# Patient Record
Sex: Female | Born: 1935
Health system: Southern US, Community
[De-identification: ages and names within clinical notes are randomized; demographics above are authoritative.]

## PROBLEM LIST (undated history)

## (undated) DIAGNOSIS — L84 Corns and callosities: Secondary | ICD-10-CM

## (undated) DIAGNOSIS — M1990 Unspecified osteoarthritis, unspecified site: Secondary | ICD-10-CM

## (undated) DIAGNOSIS — I10 Essential (primary) hypertension: Secondary | ICD-10-CM

## (undated) DIAGNOSIS — Z90710 Acquired absence of both cervix and uterus: Secondary | ICD-10-CM

## (undated) DIAGNOSIS — H59023 Cataract (lens) fragments in eye following cataract surgery, bilateral: Secondary | ICD-10-CM

## (undated) HISTORY — DX: Corns and callosities: L84

## (undated) HISTORY — PX: TOTAL SHOULDER ARTHROPLASTY: SHX126

## (undated) HISTORY — PX: ROTATOR CUFF REPAIR: SHX139

## (undated) HISTORY — PX: ABDOMINAL HYSTERECTOMY: SHX81

## (undated) HISTORY — DX: Essential (primary) hypertension: I10

## (undated) HISTORY — DX: Cataract (lens) fragments in eye following cataract surgery, bilateral: H59.023

## (undated) HISTORY — PX: NASAL SEPTUM SURGERY: SHX37

## (undated) HISTORY — DX: Acquired absence of both cervix and uterus: Z90.710

---

## 2003-10-31 ENCOUNTER — Encounter: Admission: RE | Admit: 2003-10-31 | Discharge: 2003-10-31 | Payer: Self-pay | Admitting: Orthopedic Surgery

## 2003-11-01 ENCOUNTER — Ambulatory Visit (HOSPITAL_BASED_OUTPATIENT_CLINIC_OR_DEPARTMENT_OTHER): Admission: RE | Admit: 2003-11-01 | Discharge: 2003-11-01 | Payer: Self-pay | Admitting: Orthopedic Surgery

## 2003-11-01 ENCOUNTER — Ambulatory Visit (HOSPITAL_COMMUNITY): Admission: RE | Admit: 2003-11-01 | Discharge: 2003-11-01 | Payer: Self-pay | Admitting: Orthopedic Surgery

## 2005-05-21 ENCOUNTER — Encounter: Admission: RE | Admit: 2005-05-21 | Discharge: 2005-05-21 | Payer: Self-pay | Admitting: Internal Medicine

## 2006-04-02 ENCOUNTER — Encounter: Admission: RE | Admit: 2006-04-02 | Discharge: 2006-04-02 | Payer: Self-pay | Admitting: Internal Medicine

## 2006-04-06 ENCOUNTER — Encounter: Admission: RE | Admit: 2006-04-06 | Discharge: 2006-04-06 | Payer: Self-pay | Admitting: Internal Medicine

## 2006-05-05 ENCOUNTER — Encounter: Admission: RE | Admit: 2006-05-05 | Discharge: 2006-05-05 | Payer: Self-pay | Admitting: Gastroenterology

## 2011-08-13 ENCOUNTER — Ambulatory Visit: Payer: Medicare Other | Attending: Rheumatology | Admitting: Physical Therapy

## 2011-08-13 DIAGNOSIS — IMO0001 Reserved for inherently not codable concepts without codable children: Secondary | ICD-10-CM | POA: Insufficient documentation

## 2011-08-13 DIAGNOSIS — M25519 Pain in unspecified shoulder: Secondary | ICD-10-CM | POA: Insufficient documentation

## 2011-08-13 DIAGNOSIS — M25619 Stiffness of unspecified shoulder, not elsewhere classified: Secondary | ICD-10-CM | POA: Insufficient documentation

## 2011-08-15 ENCOUNTER — Ambulatory Visit: Payer: Medicare Other | Attending: Rheumatology | Admitting: Physical Therapy

## 2011-08-15 DIAGNOSIS — IMO0001 Reserved for inherently not codable concepts without codable children: Secondary | ICD-10-CM | POA: Insufficient documentation

## 2011-08-15 DIAGNOSIS — M25519 Pain in unspecified shoulder: Secondary | ICD-10-CM | POA: Insufficient documentation

## 2011-08-15 DIAGNOSIS — M25619 Stiffness of unspecified shoulder, not elsewhere classified: Secondary | ICD-10-CM | POA: Insufficient documentation

## 2011-08-18 ENCOUNTER — Ambulatory Visit: Payer: Medicare Other | Admitting: Physical Therapy

## 2011-08-22 ENCOUNTER — Ambulatory Visit: Payer: Medicare Other | Admitting: Physical Therapy

## 2011-08-26 ENCOUNTER — Ambulatory Visit: Payer: Medicare Other | Admitting: Physical Therapy

## 2011-08-29 ENCOUNTER — Ambulatory Visit: Payer: Medicare Other | Admitting: Physical Therapy

## 2011-09-02 ENCOUNTER — Ambulatory Visit: Payer: Medicare Other | Admitting: Physical Therapy

## 2011-09-04 ENCOUNTER — Ambulatory Visit: Payer: Medicare Other | Admitting: Physical Therapy

## 2011-09-09 ENCOUNTER — Ambulatory Visit: Payer: Medicare Other | Admitting: Physical Therapy

## 2011-09-11 ENCOUNTER — Ambulatory Visit: Payer: Medicare Other | Admitting: Physical Therapy

## 2011-09-12 ENCOUNTER — Ambulatory Visit: Payer: Medicare Other | Admitting: Physical Therapy

## 2011-09-15 ENCOUNTER — Ambulatory Visit: Payer: Medicare Other | Attending: Rheumatology | Admitting: Physical Therapy

## 2011-09-15 DIAGNOSIS — M25519 Pain in unspecified shoulder: Secondary | ICD-10-CM | POA: Insufficient documentation

## 2011-09-15 DIAGNOSIS — IMO0001 Reserved for inherently not codable concepts without codable children: Secondary | ICD-10-CM | POA: Insufficient documentation

## 2011-09-15 DIAGNOSIS — M25619 Stiffness of unspecified shoulder, not elsewhere classified: Secondary | ICD-10-CM | POA: Insufficient documentation

## 2011-09-18 ENCOUNTER — Ambulatory Visit: Payer: Medicare Other | Admitting: Physical Therapy

## 2012-08-03 ENCOUNTER — Ambulatory Visit: Payer: Medicare Other | Attending: Orthopaedic Surgery | Admitting: Physical Therapy

## 2012-08-03 DIAGNOSIS — M25519 Pain in unspecified shoulder: Secondary | ICD-10-CM | POA: Insufficient documentation

## 2012-08-03 DIAGNOSIS — Z96619 Presence of unspecified artificial shoulder joint: Secondary | ICD-10-CM | POA: Insufficient documentation

## 2012-08-03 DIAGNOSIS — M25619 Stiffness of unspecified shoulder, not elsewhere classified: Secondary | ICD-10-CM | POA: Insufficient documentation

## 2012-08-03 DIAGNOSIS — IMO0001 Reserved for inherently not codable concepts without codable children: Secondary | ICD-10-CM | POA: Insufficient documentation

## 2012-08-06 ENCOUNTER — Ambulatory Visit: Payer: Medicare Other | Admitting: Physical Therapy

## 2012-08-09 ENCOUNTER — Ambulatory Visit: Payer: Medicare Other | Admitting: Physical Therapy

## 2012-08-12 ENCOUNTER — Ambulatory Visit: Payer: Medicare Other | Admitting: Physical Therapy

## 2012-08-16 ENCOUNTER — Ambulatory Visit: Payer: Medicare Other | Attending: Orthopaedic Surgery | Admitting: Physical Therapy

## 2012-08-16 DIAGNOSIS — Z96619 Presence of unspecified artificial shoulder joint: Secondary | ICD-10-CM | POA: Insufficient documentation

## 2012-08-16 DIAGNOSIS — M25519 Pain in unspecified shoulder: Secondary | ICD-10-CM | POA: Insufficient documentation

## 2012-08-16 DIAGNOSIS — IMO0001 Reserved for inherently not codable concepts without codable children: Secondary | ICD-10-CM | POA: Insufficient documentation

## 2012-08-16 DIAGNOSIS — M25619 Stiffness of unspecified shoulder, not elsewhere classified: Secondary | ICD-10-CM | POA: Insufficient documentation

## 2012-08-19 ENCOUNTER — Ambulatory Visit: Payer: Medicare Other | Admitting: Physical Therapy

## 2012-08-23 ENCOUNTER — Ambulatory Visit: Payer: Medicare Other | Admitting: Physical Therapy

## 2012-08-26 ENCOUNTER — Ambulatory Visit: Payer: Medicare Other | Admitting: Physical Therapy

## 2012-08-30 ENCOUNTER — Ambulatory Visit: Payer: Medicare Other | Admitting: Physical Therapy

## 2012-09-02 ENCOUNTER — Ambulatory Visit: Payer: Medicare Other | Admitting: Physical Therapy

## 2012-09-06 ENCOUNTER — Ambulatory Visit: Payer: Medicare Other | Admitting: Physical Therapy

## 2012-09-09 ENCOUNTER — Encounter: Payer: Self-pay | Admitting: Podiatry

## 2012-09-09 ENCOUNTER — Ambulatory Visit: Payer: Medicare Other | Admitting: Physical Therapy

## 2012-09-13 ENCOUNTER — Ambulatory Visit: Payer: Medicare Other | Admitting: Physical Therapy

## 2012-09-15 ENCOUNTER — Ambulatory Visit (INDEPENDENT_AMBULATORY_CARE_PROVIDER_SITE_OTHER): Payer: Medicare Other | Admitting: Podiatry

## 2012-09-15 ENCOUNTER — Encounter: Payer: Self-pay | Admitting: Podiatry

## 2012-09-15 VITALS — BP 151/94 | HR 68 | Ht 60.0 in | Wt 108.0 lb

## 2012-09-15 DIAGNOSIS — M25572 Pain in left ankle and joints of left foot: Secondary | ICD-10-CM

## 2012-09-15 DIAGNOSIS — M25579 Pain in unspecified ankle and joints of unspecified foot: Secondary | ICD-10-CM

## 2012-09-15 DIAGNOSIS — Q828 Other specified congenital malformations of skin: Secondary | ICD-10-CM

## 2012-09-15 NOTE — Patient Instructions (Addendum)
Today all corns, calluses and toe nails trimmed. Return as needed.

## 2012-09-15 NOTE — Progress Notes (Signed)
Subjective: 79 y.o. year old female patient presents complaining of painful callus under the ball of left foot. Patient requests toe nails, corns and calluses trimmed.   Review of Systems - General ROS: negative for - chills, fatigue, fever, hot flashes, malaise, night sweats, sleep disturbance, weight gain or weight loss.  Objective: Dermatologic: Mildly hypertrophic nails. Porokeratotic lesion under 5th MPJ left foot symptomatic.  Vascular: Pedal pulses are all palpable. Orthopedic: Contracted lesser digits  Neurologic: All epicritic and tactile sensations grossly intact.  Assessment: Painful porokeratosis sub 5 left.  Treatment: All mycotic nails, corns, calluses debrided.  Return in 3 months or as needed.

## 2012-09-16 ENCOUNTER — Ambulatory Visit: Payer: Medicare Other | Attending: Orthopaedic Surgery | Admitting: Physical Therapy

## 2012-09-16 DIAGNOSIS — M25619 Stiffness of unspecified shoulder, not elsewhere classified: Secondary | ICD-10-CM | POA: Insufficient documentation

## 2012-09-16 DIAGNOSIS — Z96619 Presence of unspecified artificial shoulder joint: Secondary | ICD-10-CM | POA: Insufficient documentation

## 2012-09-16 DIAGNOSIS — IMO0001 Reserved for inherently not codable concepts without codable children: Secondary | ICD-10-CM | POA: Insufficient documentation

## 2012-09-16 DIAGNOSIS — M25519 Pain in unspecified shoulder: Secondary | ICD-10-CM | POA: Insufficient documentation

## 2012-09-20 ENCOUNTER — Ambulatory Visit: Payer: Medicare Other | Admitting: Physical Therapy

## 2012-09-23 ENCOUNTER — Ambulatory Visit: Payer: Medicare Other | Admitting: Physical Therapy

## 2012-09-28 ENCOUNTER — Ambulatory Visit: Payer: Medicare Other | Admitting: Physical Therapy

## 2012-09-30 ENCOUNTER — Ambulatory Visit: Payer: Medicare Other | Admitting: Physical Therapy

## 2012-10-04 ENCOUNTER — Ambulatory Visit: Payer: Medicare Other | Admitting: Physical Therapy

## 2012-10-05 ENCOUNTER — Encounter: Payer: Medicare Other | Admitting: Physical Therapy

## 2012-10-05 ENCOUNTER — Ambulatory Visit: Payer: Medicare Other | Admitting: Physical Therapy

## 2012-10-07 ENCOUNTER — Ambulatory Visit: Payer: Medicare Other | Admitting: Physical Therapy

## 2012-10-08 ENCOUNTER — Ambulatory Visit: Payer: Self-pay | Admitting: Podiatry

## 2012-10-12 ENCOUNTER — Ambulatory Visit: Payer: Medicare Other | Admitting: Physical Therapy

## 2012-10-14 ENCOUNTER — Ambulatory Visit: Payer: Medicare Other | Attending: Orthopaedic Surgery | Admitting: Physical Therapy

## 2012-10-14 DIAGNOSIS — M25519 Pain in unspecified shoulder: Secondary | ICD-10-CM | POA: Insufficient documentation

## 2012-10-14 DIAGNOSIS — M25619 Stiffness of unspecified shoulder, not elsewhere classified: Secondary | ICD-10-CM | POA: Insufficient documentation

## 2012-10-14 DIAGNOSIS — Z96619 Presence of unspecified artificial shoulder joint: Secondary | ICD-10-CM | POA: Insufficient documentation

## 2012-10-14 DIAGNOSIS — IMO0001 Reserved for inherently not codable concepts without codable children: Secondary | ICD-10-CM | POA: Insufficient documentation

## 2012-10-19 ENCOUNTER — Ambulatory Visit: Payer: Medicare Other | Admitting: Physical Therapy

## 2012-10-21 ENCOUNTER — Ambulatory Visit: Payer: Medicare Other | Admitting: Physical Therapy

## 2012-10-26 ENCOUNTER — Ambulatory Visit: Payer: Medicare Other | Admitting: Physical Therapy

## 2012-10-28 ENCOUNTER — Ambulatory Visit: Payer: Medicare Other | Admitting: Physical Therapy

## 2012-11-01 ENCOUNTER — Ambulatory Visit: Payer: Medicare Other | Admitting: Physical Therapy

## 2012-11-02 ENCOUNTER — Ambulatory Visit: Payer: Medicare Other | Admitting: Physical Therapy

## 2012-11-04 ENCOUNTER — Ambulatory Visit: Payer: Medicare Other | Admitting: Physical Therapy

## 2012-11-09 ENCOUNTER — Ambulatory Visit: Payer: Medicare Other | Admitting: Physical Therapy

## 2012-11-11 ENCOUNTER — Ambulatory Visit: Payer: Medicare Other | Admitting: Physical Therapy

## 2012-11-16 ENCOUNTER — Ambulatory Visit: Payer: Medicare Other | Attending: Orthopaedic Surgery | Admitting: Physical Therapy

## 2012-11-16 DIAGNOSIS — M25519 Pain in unspecified shoulder: Secondary | ICD-10-CM | POA: Insufficient documentation

## 2012-11-16 DIAGNOSIS — Z96619 Presence of unspecified artificial shoulder joint: Secondary | ICD-10-CM | POA: Insufficient documentation

## 2012-11-16 DIAGNOSIS — IMO0001 Reserved for inherently not codable concepts without codable children: Secondary | ICD-10-CM | POA: Insufficient documentation

## 2012-11-16 DIAGNOSIS — M25619 Stiffness of unspecified shoulder, not elsewhere classified: Secondary | ICD-10-CM | POA: Insufficient documentation

## 2012-11-18 ENCOUNTER — Ambulatory Visit: Payer: Medicare Other | Admitting: Physical Therapy

## 2012-11-23 ENCOUNTER — Ambulatory Visit: Payer: Medicare Other | Admitting: Physical Therapy

## 2012-11-24 ENCOUNTER — Ambulatory Visit (INDEPENDENT_AMBULATORY_CARE_PROVIDER_SITE_OTHER): Payer: Medicare Other | Admitting: Podiatry

## 2012-11-24 VITALS — BP 142/88 | HR 68

## 2012-11-24 DIAGNOSIS — L84 Corns and callosities: Secondary | ICD-10-CM

## 2012-11-24 DIAGNOSIS — M25579 Pain in unspecified ankle and joints of unspecified foot: Secondary | ICD-10-CM

## 2012-11-24 NOTE — Progress Notes (Signed)
Subjective: 79 year old female patient presents with daughter who is visiting from [place], complaining of pain under the 5th MPJ corn and right 5th toe corn. Patient has corn pad under the 5th MPJ area left foot. Objective: Varus rotated 5th digit with enlarged distal phalangeal bone and callus build up. Plantar porokeratosis sub 5 left. Digital corn distal lateral 5th right. Pedal pulses palpable bilateral. No other problems at this time. Assessment: Porokeratosis painful right 5th digit and left under 5th MPJ. Contracted 5th digit bilateral. Plan: Debrided all painful corns and calluses.

## 2012-11-25 ENCOUNTER — Ambulatory Visit: Payer: Medicare Other | Admitting: Physical Therapy

## 2012-11-30 ENCOUNTER — Ambulatory Visit: Payer: Medicare Other | Admitting: Physical Therapy

## 2012-12-02 ENCOUNTER — Ambulatory Visit: Payer: Medicare Other | Admitting: Physical Therapy

## 2012-12-07 ENCOUNTER — Ambulatory Visit: Payer: Medicare Other | Admitting: Physical Therapy

## 2012-12-09 ENCOUNTER — Ambulatory Visit: Payer: Medicare Other | Admitting: Physical Therapy

## 2012-12-14 ENCOUNTER — Ambulatory Visit: Payer: Medicare Other | Attending: Orthopaedic Surgery | Admitting: Physical Therapy

## 2012-12-14 DIAGNOSIS — M25519 Pain in unspecified shoulder: Secondary | ICD-10-CM | POA: Insufficient documentation

## 2012-12-14 DIAGNOSIS — IMO0001 Reserved for inherently not codable concepts without codable children: Secondary | ICD-10-CM | POA: Insufficient documentation

## 2012-12-14 DIAGNOSIS — Z96619 Presence of unspecified artificial shoulder joint: Secondary | ICD-10-CM | POA: Insufficient documentation

## 2012-12-14 DIAGNOSIS — M25619 Stiffness of unspecified shoulder, not elsewhere classified: Secondary | ICD-10-CM | POA: Insufficient documentation

## 2012-12-16 ENCOUNTER — Ambulatory Visit: Payer: Medicare Other | Admitting: Physical Therapy

## 2012-12-21 ENCOUNTER — Ambulatory Visit: Payer: Medicare Other | Admitting: Physical Therapy

## 2012-12-23 ENCOUNTER — Ambulatory Visit: Payer: Medicare Other | Admitting: Physical Therapy

## 2012-12-28 ENCOUNTER — Ambulatory Visit: Payer: Medicare Other | Admitting: Physical Therapy

## 2012-12-30 ENCOUNTER — Ambulatory Visit: Payer: Medicare Other | Admitting: Rehabilitation

## 2012-12-30 ENCOUNTER — Ambulatory Visit: Payer: Medicare Other | Admitting: Physical Therapy

## 2013-01-04 ENCOUNTER — Ambulatory Visit: Payer: Medicare Other | Admitting: Physical Therapy

## 2013-01-06 ENCOUNTER — Ambulatory Visit: Payer: Medicare Other | Admitting: Physical Therapy

## 2013-01-11 ENCOUNTER — Ambulatory Visit: Payer: Medicare Other | Admitting: Physical Therapy

## 2013-01-13 ENCOUNTER — Ambulatory Visit: Payer: Medicare Other | Admitting: Physical Therapy

## 2013-01-18 ENCOUNTER — Ambulatory Visit: Payer: Medicare Other | Attending: Orthopaedic Surgery | Admitting: Physical Therapy

## 2013-01-18 DIAGNOSIS — M25519 Pain in unspecified shoulder: Secondary | ICD-10-CM | POA: Insufficient documentation

## 2013-01-18 DIAGNOSIS — Z96619 Presence of unspecified artificial shoulder joint: Secondary | ICD-10-CM | POA: Insufficient documentation

## 2013-01-18 DIAGNOSIS — M25619 Stiffness of unspecified shoulder, not elsewhere classified: Secondary | ICD-10-CM | POA: Insufficient documentation

## 2013-01-18 DIAGNOSIS — IMO0001 Reserved for inherently not codable concepts without codable children: Secondary | ICD-10-CM | POA: Insufficient documentation

## 2013-01-20 ENCOUNTER — Ambulatory Visit: Payer: Medicare Other | Admitting: Physical Therapy

## 2013-01-26 ENCOUNTER — Ambulatory Visit (INDEPENDENT_AMBULATORY_CARE_PROVIDER_SITE_OTHER): Payer: Medicare Other | Admitting: Podiatry

## 2013-01-26 DIAGNOSIS — M25579 Pain in unspecified ankle and joints of unspecified foot: Secondary | ICD-10-CM

## 2013-01-26 DIAGNOSIS — M25572 Pain in left ankle and joints of left foot: Secondary | ICD-10-CM

## 2013-01-26 DIAGNOSIS — L84 Corns and callosities: Secondary | ICD-10-CM

## 2013-01-26 NOTE — Progress Notes (Signed)
79 year old female presents with painful callus on left foot. Patient requests the area to be numbed before trimming.  Objective: Pedal pulses are all palpable. Multiple digital corns 5th distal lateral bilateral. Callus under 5th MPJ left. Porokeratotic lesion under lateral plantar proximal to the 5th MPJ left. Multiple digital contractures bilateral. All epicritic and tactile sensations grossly intact. Mycotic nails x 10.  Assessment: Painful porokeratosis sub 5 left and lateral surface left foot. Hypertrophic nails x 10.  Tx. 3ml of 1% Xylocaine with epinephrine injected proximal to plantar lateral lesion. All calluses, porokeratosis and nails debrided. This relieved pain.  Return as needed.

## 2013-01-27 ENCOUNTER — Ambulatory Visit: Payer: Medicare Other | Admitting: Physical Therapy

## 2013-02-01 ENCOUNTER — Ambulatory Visit: Payer: Medicare Other | Admitting: Physical Therapy

## 2013-02-03 ENCOUNTER — Ambulatory Visit: Payer: Medicare Other | Admitting: Physical Therapy

## 2013-02-08 ENCOUNTER — Ambulatory Visit: Payer: Medicare Other | Admitting: Physical Therapy

## 2013-02-10 ENCOUNTER — Ambulatory Visit: Payer: Medicare Other | Admitting: Physical Therapy

## 2013-02-15 ENCOUNTER — Encounter: Payer: Medicare Other | Admitting: Physical Therapy

## 2013-02-16 ENCOUNTER — Ambulatory Visit: Payer: Medicare Other | Admitting: Physical Therapy

## 2013-02-18 ENCOUNTER — Ambulatory Visit: Payer: Medicare Other | Admitting: Physical Therapy

## 2013-03-22 ENCOUNTER — Ambulatory Visit: Payer: Medicare Other | Attending: Orthopaedic Surgery | Admitting: Physical Therapy

## 2013-03-22 DIAGNOSIS — IMO0001 Reserved for inherently not codable concepts without codable children: Secondary | ICD-10-CM | POA: Insufficient documentation

## 2013-03-22 DIAGNOSIS — Z96619 Presence of unspecified artificial shoulder joint: Secondary | ICD-10-CM | POA: Insufficient documentation

## 2013-03-22 DIAGNOSIS — M25519 Pain in unspecified shoulder: Secondary | ICD-10-CM | POA: Insufficient documentation

## 2013-03-22 DIAGNOSIS — M25619 Stiffness of unspecified shoulder, not elsewhere classified: Secondary | ICD-10-CM | POA: Insufficient documentation

## 2013-06-15 ENCOUNTER — Ambulatory Visit (INDEPENDENT_AMBULATORY_CARE_PROVIDER_SITE_OTHER): Payer: Medicare Other | Admitting: Podiatry

## 2013-06-15 ENCOUNTER — Encounter: Payer: Self-pay | Admitting: Podiatry

## 2013-06-15 VITALS — BP 145/58 | HR 68 | Ht <= 58 in | Wt 111.0 lb

## 2013-06-15 DIAGNOSIS — L84 Corns and callosities: Secondary | ICD-10-CM

## 2013-06-15 DIAGNOSIS — M25579 Pain in unspecified ankle and joints of unspecified foot: Secondary | ICD-10-CM

## 2013-06-15 NOTE — Patient Instructions (Signed)
Seen for painful calluses. All calluses debrided. Return as needed.  

## 2013-06-15 NOTE — Progress Notes (Signed)
Subjective: 79 year old female presents with painful callus on left foot.   Objective: Pedal pulses are all palpable.  Multiple digital corns 5th distal lateral bilateral. Callus under 5th MPJ left. Porokeratotic lesion under lateral plantar proximal to the 5th MPJ left.  Multiple digital contractures bilateral.  All epicritic and tactile sensations grossly intact.  Mycotic nails x 10.   Assessment: Painful porokeratosis sub 5 left and lateral surface left foot.  Hypertrophic nails x 10.   Tx.  All calluses, porokeratosis and nails debrided. This relieved pain.  Return as needed.

## 2013-09-13 ENCOUNTER — Encounter: Payer: Self-pay | Admitting: Podiatry

## 2013-09-13 ENCOUNTER — Ambulatory Visit: Payer: Medicare Other | Admitting: Podiatry

## 2013-09-13 ENCOUNTER — Ambulatory Visit (INDEPENDENT_AMBULATORY_CARE_PROVIDER_SITE_OTHER): Payer: Self-pay | Admitting: Podiatry

## 2013-09-13 VITALS — BP 152/92 | HR 63 | Ht <= 58 in | Wt 110.0 lb

## 2013-09-13 DIAGNOSIS — L84 Corns and callosities: Secondary | ICD-10-CM

## 2013-09-13 DIAGNOSIS — M25579 Pain in unspecified ankle and joints of unspecified foot: Secondary | ICD-10-CM

## 2013-09-13 NOTE — Patient Instructions (Signed)
Seen for painful calluses on both feet. All nails, corns and calluses debrided. Return in 2 months or as needed.

## 2013-09-13 NOTE — Progress Notes (Signed)
Subjective:  80 year old female presents with painful callus on left foot.   Objective: Pedal pulses are all palpable.  Multiple digital corns 5th distal lateral bilateral. Callus under 5th MPJ left.  Painful porokeratotic lesion under lateral plantar proximal to the 5th MPJ left.  Multiple digital contractures bilateral.  All epicritic and tactile sensations grossly intact.  Hypertrophic nails x 10.   Assessment: Painful porokeratosis sub 5 left and lateral surface left foot.  Hypertrophic nails x 10.   Plan: All calluses, porokeratosis and nails debrided.  Return in 2 months or as needed.

## 2014-03-24 ENCOUNTER — Ambulatory Visit (INDEPENDENT_AMBULATORY_CARE_PROVIDER_SITE_OTHER): Payer: Medicare Other | Admitting: Podiatrist

## 2014-03-24 ENCOUNTER — Ambulatory Visit (INDEPENDENT_AMBULATORY_CARE_PROVIDER_SITE_OTHER): Payer: Medicare Other

## 2014-03-24 VITALS — BP 163/77 | HR 66 | Resp 16

## 2014-03-24 DIAGNOSIS — L84 Corns and callosities: Secondary | ICD-10-CM

## 2014-03-24 DIAGNOSIS — M216X9 Other acquired deformities of unspecified foot: Secondary | ICD-10-CM

## 2014-03-24 DIAGNOSIS — M79673 Pain in unspecified foot: Secondary | ICD-10-CM

## 2014-03-24 DIAGNOSIS — Q828 Other specified congenital malformations of skin: Secondary | ICD-10-CM

## 2014-03-24 NOTE — Patient Instructions (Signed)
Corns and Calluses Corns are small areas of thickened skin that usually occur on the top, sides, or tip of a toe. They contain a cone-shaped core with a point that can press on a nerve below. This causes pain. Calluses are areas of thickened skin that usually develop on hands, fingers, palms, soles of the feet, and heels. These are areas that experience frequent friction or pressure. CAUSES  Corns are usually the result of rubbing (friction) or pressure from shoes that are too tight or do not fit properly. Calluses are caused by repeated friction and pressure on the affected areas. SYMPTOMS  A hard growth on the skin.  Pain or tenderness under the skin.  Sometimes, redness and swelling.  Increased discomfort while wearing tight-fitting shoes. DIAGNOSIS  Your caregiver can usually tell what the problem is by doing a physical exam. TREATMENT  Removing the cause of the friction or pressure is usually the only treatment needed. However, sometimes medicines can be used to help soften the hardened, thickened areas. These medicines include salicylic acid plasters and 12% ammonium lactate lotion. These medicines should only be used under the direction of your caregiver. HOME CARE INSTRUCTIONS   Try to remove pressure from the affected area.  You may wear donut-shaped corn pads to protect your skin.  You may use a pumice stone or nonmetallic nail file to gently reduce the thickness of a corn.  Wear properly fitted footwear.  If you have calluses on the hands, wear gloves during activities that cause friction.  If you have diabetes, you should regularly examine your feet. Tell your caregiver if you notice any problems with your feet. SEEK IMMEDIATE MEDICAL CARE IF:   You have increased pain, swelling, redness, or warmth in the affected area.  Your corn or callus starts to drain fluid or bleeds.  You are not getting better, even with treatment. Document Released: 03/08/2004 Document  Revised: 08/25/2011 Document Reviewed: 01/28/2011 Exit[employer] Patient Information 2015 [employer]. This information is not intended to replace advice given to you by your health care provider. Make sure you discuss any questions you have with your health care provider.  

## 2014-03-24 NOTE — Progress Notes (Signed)
   Subjective:    Patient ID: Danielle Dean, female    DOB: Oct 31, 1928, 80 y.o.   MRN: [id number]  HPI Comments: "I have these hard places on my toes"  Patient c/o thick, callused areas 5th toes bilateral and sub 5th MPJ bilateral, left over right, for several years. She is having trouble wearing shoes. She has seen Dr. Caffie Pinto in the past and she has trimmed. She tried corn remover at home. No help.  Toe Pain       Review of Systems  HENT: Positive for sinus pressure and sneezing.   Respiratory: Positive for cough.   Musculoskeletal: Positive for arthralgias and gait problem.  All other systems reviewed and are negative.      Objective:   Physical Exam  Patient is awake, alert, and oriented x 3.  In no acute distress.  Vascular status is intact with palpable pedal pulses at 2/4 DP and PT bilateral and capillary refill time within normal limits. Neurological sensation is also intact bilaterally via Semmes Weinstein monofilament at 5/5 sites. Light touch, vibratory sensation, Achilles tendon reflex is intact. Dermatological exam reveals skin color, turger and texture as normal. No open lesions present.  Musculature intact with dorsiflexion, plantarflexion, inversion, eversion.  Thin skin of feet, hpks bilateral 5th met heads, hpk's bilateral 5th toes laterally, adducto rotation 5th toes, pain right first mpj also noted.      Assessment & Plan:  Multiple calluses, prominent metatarsal head, porokeratotic lesions  Plan:  debridment of lesions carried out today without complication.  Recommended accomidative type orthotics to help cushion and pad the thin skin of her feet.  She was scanned at today's visit.  Will see her back for dispensing of the devices.  If any problems or concerns arise she will call.

## 2014-04-11 ENCOUNTER — Telehealth: Payer: Self-pay | Admitting: *Deleted

## 2014-04-11 NOTE — Telephone Encounter (Signed)
She's a patient there.  Can someone give me a call.  I want to discuss her treatment.  I'm her daughter.  Thank you.

## 2014-04-12 NOTE — Telephone Encounter (Signed)
I attempted to return her call and was disconnected.

## 2014-04-18 ENCOUNTER — Telehealth: Payer: Self-pay | Admitting: *Deleted

## 2014-04-18 NOTE — Telephone Encounter (Signed)
I want to talk to Dr. Faylene MillionEgerton's nurse.  I returned her call.  "I was there in October and she trimmed my calluses.  The one on my little finger still bothers me.  I was calling to schedule an appointment.  My daughter informed me that she has already scheduled me an appointment for Wednesday of next week at 2 o'clock.  Thanks for calling me back."

## 2014-04-26 ENCOUNTER — Ambulatory Visit (INDEPENDENT_AMBULATORY_CARE_PROVIDER_SITE_OTHER): Payer: Medicare Other | Admitting: Podiatrist

## 2014-04-26 ENCOUNTER — Encounter: Payer: Self-pay | Admitting: Podiatrist

## 2014-04-26 VITALS — BP 145/72 | HR 67 | Resp 16

## 2014-04-26 DIAGNOSIS — M216X9 Other acquired deformities of unspecified foot: Secondary | ICD-10-CM

## 2014-04-26 DIAGNOSIS — L84 Corns and callosities: Secondary | ICD-10-CM

## 2014-04-26 DIAGNOSIS — Q828 Other specified congenital malformations of skin: Secondary | ICD-10-CM

## 2014-04-30 NOTE — Progress Notes (Signed)
   Subjective:    Patient ID: Danielle Dean, female    DOB: 11-11-1928, 80 y.o.   MRN: [id number]  HPI Comments: "I have these hard places on my toes"  Patient presents with her daughter c/o thick, callused areas 5th toes bilateral and sub 5th MPJ bilateral, left over right, for several years. Her other daughter is a rheumatologist and suggested injections as she attributed her foot pain to arthritis.  I recommended a soft orthotic at the last visit and she has declined.    Toe Pain     Objective:   Physical Exam  Patient is awake, alert, and oriented x 3.  In no acute distress.  Neurovascular status intact and unchanged.   Thin skin of feet, hpks bilateral 5th met heads, hpk's bilateral 5th toes laterally, adducto rotation 5th toes left greater than right, pain right first mpj also noted.      Assessment & Plan:  Multiple calluses, prominent metatarsal head, porokeratotic lesions, arthritis  Plan:  Injected the left fifth toe with dexamethasone and marcaine plain.  debridment of lesions also carried out today without complication. She will be seen back in the future prn.  Discussed orthotics again however she would like to try shoes first.

## 2014-05-25 ENCOUNTER — Encounter: Payer: Self-pay | Admitting: Podiatry

## 2014-05-25 ENCOUNTER — Ambulatory Visit (INDEPENDENT_AMBULATORY_CARE_PROVIDER_SITE_OTHER): Payer: Medicare Other | Admitting: Podiatry

## 2014-05-25 VITALS — BP 145/72 | HR 67 | Resp 16

## 2014-05-25 DIAGNOSIS — L84 Corns and callosities: Secondary | ICD-10-CM | POA: Diagnosis not present

## 2014-05-25 DIAGNOSIS — M2042 Other hammer toe(s) (acquired), left foot: Secondary | ICD-10-CM

## 2014-05-25 NOTE — Patient Instructions (Signed)

## 2014-05-25 NOTE — Progress Notes (Signed)
Subjective:     Patient ID: Danielle Dean, female   DOB: 10/28/1928, 80 y.o.   MRN: [id number]  HPI patient presents with daughter with a painful fifth toe left foot on the distal lateral side that's very tender when pressed and making it hard to walk comfortably she only was treated a few weeks ago and it has already reoccurred to be significant degree   Review of Systems     Objective:   Physical Exam Neurovascular status intact with significant distal rotation of the fifth toe left with obvious digital deformity and distal lateral keratotic lesion formation that's painful    Assessment:     Hammertoe deformity with distal lateral keratotic lesion secondary to digital structure    Plan:     Explained condition to family and at this time after numbing the toe with Xylocaine I debrided the tissue fully and applied pad area I then dispensed orthotics with instructions and gave instructions for a distal derotational arthroplasty of the fifth toe left with distal lateral exostectomy if symptoms were to reoccur in a rapid fashion. [place] going to [place] weeks and will be seen after that for reevaluation and consideration of procedure if symptoms are present

## 2014-06-15 ENCOUNTER — Ambulatory Visit: Payer: Medicare Other | Admitting: Podiatrist

## 2014-06-30 ENCOUNTER — Encounter: Payer: Self-pay | Admitting: Podiatrist

## 2014-06-30 ENCOUNTER — Ambulatory Visit (INDEPENDENT_AMBULATORY_CARE_PROVIDER_SITE_OTHER): Payer: Medicare Other | Admitting: Podiatrist

## 2014-06-30 VITALS — BP 154/81 | HR 68 | Resp 16

## 2014-06-30 DIAGNOSIS — Q828 Other specified congenital malformations of skin: Secondary | ICD-10-CM | POA: Diagnosis not present

## 2014-06-30 DIAGNOSIS — L84 Corns and callosities: Secondary | ICD-10-CM

## 2014-06-30 DIAGNOSIS — M2042 Other hammer toe(s) (acquired), left foot: Secondary | ICD-10-CM

## 2014-06-30 NOTE — Progress Notes (Signed)
   Subjective:    Patient ID: Danielle Dean, female    DOB: 12/28/1928, 81 y.o.   MRN: [id number]  HPI Comments:   Patient presents with continued pain and discomfort due to  callused areas 5th toes bilateral and sub 5th MPJ bilateral, left over right, for several years. She has tried the soft orthotics I recommended and relates the discomfort is still present and bothersome to her.  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  She would like any an all conservative options for her issue.      Objective:   Physical Exam  Patient is awake, alert, and oriented x 3.  In no acute distress.  Neurovascular status intact and unchanged.   Thin skin of feet,  adducto rotation 5th toes left greater than right is noted. She has intractable porokeratotic type lesions/blisters corns on the distal lateral aspect of bilateral fifth digits with the left being more symptomatic than the right. She also has painful hyperkeratotic lesion submetatarsal 5 left as well. Intact integument is noted with no breakdown in tissue beneath the corns noted. Orthotics are noted to contour nicely with a cut out for the head and digits fifth bilateral. However the patient relates that they are not helping much to prevent the breakdown in skin on the toes.    Assessment & Plan:  Multiple calluses, prominent metatarsal head, porokeratotic lesions, arthritis  Plan: A thorough debridement of the calluses was carried out today with a #15 blade without complication. No anesthesia was used to today's visit. I also modified the orthotics to take the pressure off of the fifth digits more so than already done. Discussed that ultimately she will likely require surgery to remove the bony exostosis and derotate the toe. This could be done as an in office procedure if she is interested. I did also discuss all treatment options both conservative and surgically with her daughter over the telephone in  [place] and we decided that the best course of action would be to wait until this summer when her daughters could be with her and help her with her recovery process as well as help take care of her husband. In the meantime we will continue with routine care and see if we can get the orthotics to be helpful.

## 2014-08-11 ENCOUNTER — Telehealth: Payer: Self-pay | Admitting: *Deleted

## 2014-08-11 NOTE — Telephone Encounter (Signed)
Pt's dtr, called states she would like to set up surgery for her mother with Dr. Irving ShowsEgerton on 10/02/2014.  Dr. Irving ShowsEgerton states she will not be performing surgery after March 2016, refer pt to Dr. Ardelle AntonWagoner.  Pt's dtr did not leave a birth date and was difficult to understand, so I left Dr. Faylene MillionEgerton's recommendations on the [phone].  Dr. Irving ShowsEgerton was able to decipher the pt's name, so I could make note of the calls.

## 2014-08-14 NOTE — Telephone Encounter (Signed)
Yes, that's a good plan.  Dr. Charlsie Merlesegal has seen her in the past, and did suggest surgery-   Thanks!

## 2014-08-14 NOTE — Telephone Encounter (Signed)
"  I'm calling to schedule my mother's surgery.  She sees Dr. Irving ShowsEgerton."  Dr. Irving ShowsEgerton is referring her to Dr. Ardelle AntonWagoner.  She will not be doing surgery after March.  Can you look and see if this is the same doctor that she saw before?"  She saw Dr. Charlsie Merlesegal previously.  "Can we schedule the surgery with him since she has seen him before and he's the one who actually stated she needed surgery first?"  Sure that is fine, we'll schedule with whomever you prefer.  "Does he do surgeries on Monday?"  No, he does surgery on Tuesday.  "So, does he have anything available for April 19th?"  Yes, as far as I know, it's available.  You will need to bring your mom in for a consultation and then we can get her scheduled for that date.  "Okay, thank you."

## 2014-08-24 ENCOUNTER — Encounter: Payer: Self-pay | Admitting: Podiatry

## 2014-08-24 ENCOUNTER — Ambulatory Visit (INDEPENDENT_AMBULATORY_CARE_PROVIDER_SITE_OTHER): Payer: Medicare Other | Admitting: Podiatry

## 2014-08-24 VITALS — BP 151/74 | HR 60 | Resp 12

## 2014-08-24 DIAGNOSIS — M2042 Other hammer toe(s) (acquired), left foot: Secondary | ICD-10-CM | POA: Diagnosis not present

## 2014-08-24 DIAGNOSIS — L84 Corns and callosities: Secondary | ICD-10-CM

## 2014-08-24 NOTE — Patient Instructions (Signed)
Pre-Operative Instructions  Congratulations, you have decided to take an important step to improving your quality of life.  You can be assured that the doctors of [facility] will be with you every step of the way.  1. Plan to be at the surgery center/hospital at least 1 (one) hour prior to your scheduled time unless otherwise directed by the surgical center/hospital staff.  You must have a responsible adult accompany you, remain during the surgery and drive you home.  Make sure you have directions to the surgical center/hospital and know how to get there on time. 2. For hospital based surgery you will need to obtain a history and physical form from your family physician within 1 month prior to the date of surgery- we will give you a form for you primary physician.  3. We make every effort to accommodate the date you request for surgery.  There are however, times where surgery dates or times have to be moved.  We will contact you as soon as possible if a change in schedule is required.   4. No Aspirin/Ibuprofen for one week before surgery.  If you are on aspirin, any non-steroidal anti-inflammatory medications (Mobic, Aleve, Ibuprofen) you should stop taking it 7 days prior to your surgery.  You make take Tylenol  For pain prior to surgery.  5. Medications- If you are taking daily heart and blood pressure medications, seizure, reflux, allergy, asthma, anxiety, pain or diabetes medications, make sure the surgery center/hospital is aware before the day of surgery so they may notify you which medications to take or avoid the day of surgery. 6. No food or drink after midnight the night before surgery unless directed otherwise by surgical center/hospital staff. 7. No alcoholic beverages 24 hours prior to surgery.  No smoking 24 hours prior to or 24 hours after surgery. 8. Wear loose pants or shorts- loose enough to fit over bandages, boots, and casts. 9. No slip on shoes, sneakers are best. 10. Bring  your boot with you to the surgery center/hospital.  Also bring crutches or a walker if your physician has prescribed it for you.  If you do not have this equipment, it will be provided for you after surgery. 11. If you have not been contracted by the surgery center/hospital by the day before your surgery, call to confirm the date and time of your surgery. 12. Leave-time from work may vary depending on the type of surgery you have.  Appropriate arrangements should be made prior to surgery with your employer. 13. Prescriptions will be provided immediately following surgery by your doctor.  Have these filled as soon as possible after surgery and take the medication as directed. 14. Remove nail polish on the operative foot. 15. Wash the night before surgery.  The night before surgery wash the foot and leg well with the antibacterial soap provided and water paying special attention to beneath the toenails and in between the toes.  Rinse thoroughly with water and dry well with a towel.  Perform this wash unless told not to do so by your physician.  Enclosed: 1 Ice pack (please put in freezer the night before surgery)   1 Hibiclens skin cleaner   Pre-op Instructions  If you have any questions regarding the instructions, do not hesitate to call our office.  [place]: [address] , [place] [address] [phone]  [place]: [address] [place], [place] [address] [phone]  [place]: [address]  [place], [place] [address] [phone]  Dr. Richard   Tuchman DPM, Dr. Malerie Eakins DPM Dr. Richard Sikora DPM, Dr. M. Todd Hyatt DPM, Dr. Kathryn Egerton DPM 

## 2014-08-25 NOTE — Progress Notes (Signed)
Subjective:     Patient ID: Danielle Dean, female   DOB: 07/16/1928, 81 y.o.   MRN: [id number]  HPI patient presents with her daughter stating that this corn is killing me on my toe and I need to have it fixed. Points to the distal lateral aspect of the fifth toe left with severe rotation of the toe noted   Review of Systems     Objective:   Physical Exam Neurovascular status is found to be intact with digits that are well perfused and is noted to have significant rotation of the fifth toe left with distal lateral keratotic lesion secondary to the position of the toe noted with pain noted upon palpation    Assessment:     Significant rotation fifth toe left foot with distal lateral keratotic lesion formation    Plan:     Reviewed condition with patient and daughter and explained the fact that she is only getting several weeks of relief with debridement. At this point I recommended distal derotational arthroplasty digit 5 left along with distal lateral exostectomy. Patient wants procedure and understands the fact that it's difficult to derotate this toe completely and that she still may have problems long-term. She wants procedure understanding risk and at this time I allowed her to read a consent form reviewing alternative treatments and complications. Patient signs consent form and is scheduled for outpatient derotational arthroplasty along with distal lateral exostectomy. She is given preoperative instructions and is encouraged to call with any questions

## 2014-09-11 ENCOUNTER — Telehealth: Payer: Self-pay | Admitting: *Deleted

## 2014-09-14 NOTE — Telephone Encounter (Signed)
"  She has a scheduled surgery with Dr. Charlsie Merlesegal on 014/18/2016.  Can you cancel?  She has to have surgery on her hips and her Rheumatologist suggest she not have foot surgery.  Call if you have questions.  I called and informed Aram [facility]ynthia at [facility].

## 2014-09-14 NOTE — Telephone Encounter (Signed)
I cancelled patients post-op appointment.  °

## 2014-09-28 ENCOUNTER — Encounter: Payer: Self-pay | Admitting: Podiatry

## 2014-09-28 ENCOUNTER — Ambulatory Visit (INDEPENDENT_AMBULATORY_CARE_PROVIDER_SITE_OTHER): Payer: Medicare Other | Admitting: Podiatry

## 2014-09-28 VITALS — BP 152/77 | HR 70 | Resp 12

## 2014-09-28 DIAGNOSIS — L84 Corns and callosities: Secondary | ICD-10-CM | POA: Diagnosis not present

## 2014-09-29 NOTE — Progress Notes (Signed)
Subjective:     Patient ID: Danielle Dean, female   DOB: 07/20/1928, 81 y.o.   MRN: [id number]  HPI Asian presents with a daughter from [place] stating these toes are bothering her and I just wanted to know what you do to fix   Review of Systems     Objective:   Physical Exam Neurovascular status intact with no other health history changes noted with distal lateral keratotic lesions of the fifth toe both feet left over right that are painful when pressed with rotation of the toe as part of the pathological process    Assessment:     Hyperkeratotic lesion formation secondary to foot and digital structure    Plan:     Explained to her distal digital procedures along with distal lateral exostectomy. Explained surgery and risk and they want to have it performed and will be done after she finishes physical therapy debrided lesions today which were tolerated well

## 2014-10-05 ENCOUNTER — Ambulatory Visit: Payer: Medicare Other | Admitting: Podiatry

## 2014-10-25 ENCOUNTER — Ambulatory Visit: Payer: Medicare Other | Attending: Rheumatology | Admitting: Physical Therapy

## 2014-10-25 ENCOUNTER — Encounter: Payer: Self-pay | Admitting: Physical Therapy

## 2014-10-25 ENCOUNTER — Ambulatory Visit: Payer: Medicare Other

## 2014-10-25 DIAGNOSIS — M7582 Other shoulder lesions, left shoulder: Secondary | ICD-10-CM | POA: Diagnosis not present

## 2014-10-25 DIAGNOSIS — M25612 Stiffness of left shoulder, not elsewhere classified: Secondary | ICD-10-CM

## 2014-10-25 DIAGNOSIS — M25611 Stiffness of right shoulder, not elsewhere classified: Secondary | ICD-10-CM

## 2014-10-25 DIAGNOSIS — M25512 Pain in left shoulder: Secondary | ICD-10-CM | POA: Diagnosis not present

## 2014-10-25 DIAGNOSIS — M25511 Pain in right shoulder: Secondary | ICD-10-CM

## 2014-10-25 NOTE — Therapy (Signed)
[facility] [address] [place], [place], [address] Phone: [phone]   Fax:  [phone]  Physical Therapy Evaluation  Patient Details  Name: Danielle KehrKarma V Yaw MRN: [id number] Date of Birth: 01/12/1929 Referring Provider:  Pollyann Savoyeveshwar, Shaili, MD  Encounter Date: 10/25/2014      PT End of Session - 10/25/14 1130    Visit Number 1   Date for PT Re-Evaluation 12/25/14   PT Start Time 1057   PT Stop Time 1148   PT Time Calculation (min) 51 min      Past Medical History  Diagnosis Date  . Hypertension   . Corns and callosities   . Callus     History reviewed. No pertinent past surgical history.  There were no vitals filed for this visit.  Visit Diagnosis:  Left shoulder pain - Plan: PT plan of care cert/re-cert  Right shoulder pain - Plan: PT plan of care cert/re-cert  Decreased ROM of left shoulder - Plan: PT plan of care cert/re-cert  Decreased ROM of right shoulder - Plan: PT plan of care cert/re-cert      Subjective Assessment - 10/25/14 1102    Subjective C/O bilateral shoulder pain for about 3 weeks, had an injection that helped, has history of the right shoulder replacement in 2014.  She is active with some housework.    Limitations Lifting;House hold activities   Diagnostic tests x-rAY   Patient Stated Goals no pain, easier to dress and do hair   Currently in Pain? Yes   Pain Score 5    Pain Location Shoulder   Pain Orientation Right;Left   Pain Descriptors / Indicators Aching   Pain Type Chronic pain   Pain Onset 1 to 4 weeks ago   Pain Frequency Intermittent   Aggravating Factors  worse with reaching, dressing and doing haitr   Pain Relieving Factors the injection helped, rest   Effect of Pain on Daily Activities some difficulty with dressing and ADL's            [facility] - 10/25/14 0001    Assessment   Medical Diagnosis bilateral shoulder pain   Onset Date 10/04/14   Prior Therapy for right shoulder 2 years ago   Precautions   Precautions None   Balance Screen   Has the patient fallen in the past 6 months No   Has the patient had a decrease in activity level because of a fear of falling?  No   Is the patient reluctant to leave their home because of a fear of falling?  No   Home Environment   Additional Comments lives with family, does some housework   Prior Function   Level of Independence Independent with basic ADLs;Independent with homemaking with ambulation   Leisure walks some   AROM   Right Shoulder Flexion 80 Degrees   Right Shoulder ABduction 74 Degrees   Right Shoulder Internal Rotation 50 Degrees   Right Shoulder External Rotation 35 Degrees   Left Shoulder Flexion 125 Degrees   Left Shoulder ABduction 95 Degrees   Left Shoulder Internal Rotation 35 Degrees   Left Shoulder External Rotation 65 Degrees   Strength   Overall Strength Comments right shoulder 3+/5, left shoulder 4-/5   Palpation   Palpation tight upper traps and rhomboids but denies tenderness   Special Tests   Rotator Cuff Impingment tests Leanord AsalHawkins- Kennedy test;Neer impingement test   Neer Impingement test    Findings Positive  Hawkins-Kennedy test   Findings Positive                   [facility] Adult PT Treatment/Exercise - 10/25/14 0001    Modalities   Modalities Ultrasound;Insurance account managerlectrical Stimulation   Electrical Stimulation   Electrical Stimulation Location left shoulder   Electrical Stimulation Parameters IFC   Electrical Stimulation Goals Pain   Ultrasound   Ultrasound Location left shoulder   Ultrasound Parameters 100% 1MHz   Ultrasound Goals Pain                  PT Short Term Goals - 10/25/14 1132    PT SHORT TERM GOAL #1   Title independent with initial HEP   Time 1   Period Weeks   Status New           PT Long Term Goals - 10/25/14 1132    PT LONG TERM GOAL #1   Title decrease pain 50%   Time 8   Period Weeks   Status  New   PT LONG TERM GOAL #2   Title increase AROM of the left shoulder to 130 degrees flexion   Time 8   Period Weeks   Status New   PT LONG TERM GOAL #3   Title report 50% easier to dress   Time 8   Period Weeks   Status New   PT LONG TERM GOAL #4   Title report no difficulty doing her hair   Time 8   Period Weeks   Status New   PT LONG TERM GOAL #5   Title increase AROM of left shoulder IR to 55 degrees   Time 8   Period Weeks   Status New               Plan - 10/25/14 1130    Clinical Impression Statement Patient with bilateral shoulder pain over the past 3 weeks, unknown cause, has had a right TSR in 2014.  Reports was having great difficulty with all ADL's due to poor ROM, strength and having pain.  Had an injection that has helped the pain some.   Pt will benefit from skilled therapeutic intervention in order to improve on the following deficits Decreased range of motion;Decreased strength;Increased muscle spasms;Pain   Rehab Potential Good   PT Frequency 2x / week   PT Duration 8 weeks   PT Treatment/Interventions Cryotherapy;Ultrasound;Moist Heat;Electrical Stimulation;Therapeutic exercise;Manual techniques;Patient/family education;Passive range of motion   PT Next Visit Plan add gym exercises   Consulted and Agree with Plan of Care Patient          G-Codes - 10/25/14 1135    Functional Assessment Tool Used FOTO   Functional Limitation Other PT primary   Other PT Primary Current Status (Z6109(G8990) At least 60 percent but less than 80 percent impaired, limited or restricted   Other PT Primary Goal Status (U0454(G8991) At least 40 percent but less than 60 percent impaired, limited or restricted       Problem List Patient Active Problem List   Diagnosis Date Noted  . Callus of foot 11/24/2012  . Pain in joint, ankle and foot 09/15/2012    Jearld LeschALBRIGHT,MICHAEL W, PT 10/25/2014, 11:37 AM  [facility] [address] [place], [place], [address] Phone: [phone]   Fax:  [phone]

## 2014-10-27 ENCOUNTER — Ambulatory Visit: Payer: Medicare Other | Admitting: Physical Therapy

## 2014-10-27 ENCOUNTER — Encounter: Payer: Self-pay | Admitting: Physical Therapy

## 2014-10-27 DIAGNOSIS — M25512 Pain in left shoulder: Secondary | ICD-10-CM | POA: Diagnosis not present

## 2014-10-27 DIAGNOSIS — M25511 Pain in right shoulder: Secondary | ICD-10-CM

## 2014-10-27 DIAGNOSIS — M25611 Stiffness of right shoulder, not elsewhere classified: Secondary | ICD-10-CM

## 2014-10-27 DIAGNOSIS — M25612 Stiffness of left shoulder, not elsewhere classified: Secondary | ICD-10-CM

## 2014-10-27 NOTE — Therapy (Signed)
[facility] [address] [place], [place], [address] Phone: [phone]   Fax:  [phone]  Physical Therapy Treatment  Patient Details  Name: Danielle Dean MRN: [id number] Date of Birth: 12/22/1928 Referring Provider:  Pollyann Savoyeveshwar, Shaili, MD  Encounter Date: 10/27/2014      PT End of Session - 10/27/14 1053    Visit Number 2   Date for PT Re-Evaluation 12/25/14   PT Start Time 1013   PT Stop Time 1053   PT Time Calculation (min) 40 min   Behavior During Therapy [facility] for tasks assessed/performed      Past Medical History  Diagnosis Date  . Hypertension   . Corns and callosities   . Callus     History reviewed. No pertinent past surgical history.  There were no vitals filed for this visit.  Visit Diagnosis:  Left shoulder pain  Right shoulder pain  Decreased ROM of left shoulder  Decreased ROM of right shoulder      Subjective Assessment - 10/27/14 1012    Subjective Reports that she felt better for awhile after last treatment, but now hurting, reports that she has to use her arm at home   Currently in Pain? Yes   Pain Score 6    Pain Location Shoulder   Pain Orientation Right;Left   Pain Descriptors / Indicators Aching   Pain Type Chronic pain                         [facility] Adult PT Treatment/Exercise - 10/27/14 0001    Shoulder Exercises: Standing   Protraction 20 reps   Theraband Level (Shoulder Protraction) Level 2 (Red)   Horizontal ABduction 20 reps   Theraband Level (Shoulder Horizontal ABduction) Level 2 (Red)   External Rotation 20 reps;Theraband   Theraband Level (Shoulder External Rotation) Level 2 (Red)   Other Standing Exercises Wall slides, circles and abduction bilaterally   Other Standing Exercises money exercise   Shoulder Exercises: ROM/Strengthening   UBE (Upper Arm Bike) Used the NuStep L4 x 6 minuts   Cybex Row 15 reps   Cybex Row Limitations 15#   "W" Arms 15   X to V Arms 15   Rhythmic Stabilization, Seated 20 reps standing   Ultrasound   Ultrasound Location left shoulder   Ultrasound Parameters 100% 1MHz   Ultrasound Goals Pain   Manual Therapy   Manual Therapy Soft tissue mobilization   Soft tissue mobilization tot he left upper arm and into the shoulder                  PT Short Term Goals - 10/25/14 1132    PT SHORT TERM GOAL #1   Title independent with initial HEP   Time 1   Period Weeks   Status New           PT Long Term Goals - 10/25/14 1132    PT LONG TERM GOAL #1   Title decrease pain 50%   Time 8   Period Weeks   Status New   PT LONG TERM GOAL #2   Title increase AROM of the left shoulder to 130 degrees flexion   Time 8   Period Weeks   Status New   PT LONG TERM GOAL #3   Title report 50% easier to dress   Time 8   Period Weeks   Status New   PT LONG TERM GOAL #  4   Title report no difficulty doing her hair   Time 8   Period Weeks   Status New   PT LONG TERM GOAL #5   Title increase AROM of left shoulder IR to 55 degrees   Time 8   Period Weeks   Status New               Plan - 10/27/14 1053    Clinical Impression Statement Good ROM, fatigues easily, some pain and ache with activity, mostly c/o left shoulder in the upper arm and in the mms, has difficutly relaxing   PT Next Visit Plan add gym exercises   Consulted and Agree with Plan of Care Patient        Problem List Patient Active Problem List   Diagnosis Date Noted  . Callus of foot 11/24/2012  . Pain in joint, ankle and foot 09/15/2012    Jearld LeschALBRIGHT,Lonnette Shrode W, PT 10/27/2014, 10:55 AM  [facility] [address] [place], [place], [address] Phone: [phone]   Fax:  [phone]

## 2014-10-30 ENCOUNTER — Ambulatory Visit: Payer: Medicare Other | Admitting: Physical Therapy

## 2014-10-30 DIAGNOSIS — M25512 Pain in left shoulder: Secondary | ICD-10-CM

## 2014-10-30 DIAGNOSIS — M25511 Pain in right shoulder: Secondary | ICD-10-CM

## 2014-10-30 DIAGNOSIS — M25612 Stiffness of left shoulder, not elsewhere classified: Secondary | ICD-10-CM

## 2014-10-30 DIAGNOSIS — M25611 Stiffness of right shoulder, not elsewhere classified: Secondary | ICD-10-CM

## 2014-10-30 NOTE — Therapy (Signed)
[facility] [address] [place], [place], [address] Phone: [phone]   Fax:  [phone]  Physical Therapy Treatment  Patient Details  Name: Danielle Dean MRN: [id number] Date of Birth: 10/31/1928 Referring Provider:  Pollyann Savoyeveshwar, Shaili, MD  Encounter Date: 10/30/2014      PT End of Session - 10/30/14 0931    Visit Number 3   Date for PT Re-Evaluation 12/25/14   PT Start Time 0931   PT Stop Time 1016   PT Time Calculation (min) 45 min      Past Medical History  Diagnosis Date  . Hypertension   . Corns and callosities   . Callus     No past surgical history on file.  There were no vitals filed for this visit.  Visit Diagnosis:  Left shoulder pain  Right shoulder pain  Decreased ROM of left shoulder  Decreased ROM of right shoulder      Subjective Assessment - 10/30/14 0934    Subjective no new complaints   Currently in Pain? Yes   Pain Score 5    Pain Location Shoulder   Pain Orientation Left   Pain Descriptors / Indicators Aching   Pain Type Chronic pain   Pain Frequency Intermittent   Aggravating Factors  worse with reaching,dressing and doing hair   Effect of Pain on Daily Activities see above                         [facility] Adult PT Treatment/Exercise - 10/30/14 0001    Exercises   Exercises Shoulder   Shoulder Exercises: Supine   Horizontal ABduction 20 reps  yellow band   Other Supine Exercises diagonals x 20 bil   Shoulder Exercises: Standing   Protraction 20 reps   Theraband Level (Shoulder Protraction) Level 1 (Yellow)   Horizontal ABduction 20 reps   Theraband Level (Shoulder Horizontal ABduction) Level 1 (Yellow)  poor form with red   External Rotation 20 reps   Theraband Level (Shoulder External Rotation) Level 1 (Yellow)   Other Standing Exercises money red band x 20   Shoulder Exercises: ROM/Strengthening   UBE (Upper Arm Bike) Used the NuStep L6 x 4;  L5 x 2 min   Cybex Row 20 reps   Cybex Row Limitations 10#  15 too much today   Other ROM/Strengthening Exercises latt pull 10#  2 sets 10   Modalities   Modalities Ultrasound   Ultrasound   Ultrasound Location Lt shoulder   Ultrasound Parameters 100% 1.5 wcm2 3.3Mhz   Ultrasound Goals Pain                PT Education - 10/30/14 1020    Education provided Yes   Education Details hep yellow band   Person(s) Educated Patient   Methods Explanation;Demonstration;Handout   Comprehension Verbalized understanding;Returned demonstration          PT Short Term Goals - 10/25/14 1132    PT SHORT TERM GOAL #1   Title independent with initial HEP   Time 1   Period Weeks   Status New           PT Long Term Goals - 10/25/14 1132    PT LONG TERM GOAL #1   Title decrease pain 50%   Time 8   Period Weeks   Status New   PT LONG TERM GOAL #2   Title increase AROM of the left shoulder to  130 degrees flexion   Time 8   Period Weeks   Status New   PT LONG TERM GOAL #3   Title report 50% easier to dress   Time 8   Period Weeks   Status New   PT LONG TERM GOAL #4   Title report no difficulty doing her hair   Time 8   Period Weeks   Status New   PT LONG TERM GOAL #5   Title increase AROM of left shoulder IR to 55 degrees   Time 8   Period Weeks   Status New               Plan - 10/30/14 1021    Clinical Impression Statement Patient demo's compensation when performing shoulder exercises in standing. She demo'd improved form with supine exercises.    PT Next Visit Plan Continue supine exercises until form improves. Progress HEP as tolerated.        Problem List Patient Active Problem List   Diagnosis Date Noted  . Callus of foot 11/24/2012  . Pain in joint, ankle and foot 09/15/2012    Solon PalmJulie Neila Teem PT  10/30/2014, 10:24 AM  [facility] [address] [place], [place], [address] Phone:  [phone]   Fax:  [phone]

## 2014-10-30 NOTE — Patient Instructions (Signed)
  Strengthening: Chest Pull - Resisted   With resistive band looped around each hand, and arms straight out in front, stretch band across chest. Repeat __10__ times per set. Do 1-3____ sets per session. Do _1___ sessions per day.  http://[email or web address].us/926   Solon PalmJulie Sylver Vantassell, PT 10/30/2014 10:16 AM

## 2014-11-03 ENCOUNTER — Ambulatory Visit: Payer: Medicare Other | Admitting: Physical Therapy

## 2014-11-03 ENCOUNTER — Encounter: Payer: Self-pay | Admitting: Physical Therapy

## 2014-11-03 DIAGNOSIS — M25612 Stiffness of left shoulder, not elsewhere classified: Secondary | ICD-10-CM

## 2014-11-03 DIAGNOSIS — M25611 Stiffness of right shoulder, not elsewhere classified: Secondary | ICD-10-CM

## 2014-11-03 DIAGNOSIS — M25512 Pain in left shoulder: Secondary | ICD-10-CM

## 2014-11-03 NOTE — Therapy (Signed)
[facility] [address] [place], [place], [address] Phone: [phone]   Fax:  [phone]  Physical Therapy Treatment  Patient Details  Name: Danielle Dean MRN: [id number] Date of Birth: 11/12/1928 Referring Provider:  Pollyann Savoyeveshwar, Shaili, MD  Encounter Date: 11/03/2014      PT End of Session - 11/03/14 1144    Visit Number 4   Date for PT Re-Evaluation 12/25/14   PT Start Time 0930   PT Stop Time 1015   PT Time Calculation (min) 45 min      Past Medical History  Diagnosis Date  . Hypertension   . Corns and callosities   . Callus     History reviewed. No pertinent past surgical history.  There were no vitals filed for this visit.  Visit Diagnosis:  Left shoulder pain  Decreased ROM of left shoulder  Decreased ROM of right shoulder      Subjective Assessment - 11/03/14 0931    Subjective Left shoulder and upper arm hurt with ADL's at home   Currently in Pain? Yes   Pain Score 5    Pain Location Shoulder   Pain Orientation Left   Pain Descriptors / Indicators Aching   Pain Type Chronic pain                         [facility] Adult PT Treatment/Exercise - 11/03/14 0001    Shoulder Exercises: Supine   Other Supine Exercises diagonals x 20 bil   Shoulder Exercises: Seated   Row 20 reps   Row Weight (lbs) 3   Horizontal ABduction 20 reps   Horizontal ABduction Weight (lbs) 3   Horizontal ABduction Limitations bent over   External Rotation 20 reps   External Rotation Weight (lbs) 2   Other Seated Exercises 3# bent over row, extension and biceps   Shoulder Exercises: Standing   Protraction 20 reps   Theraband Level (Shoulder Protraction) Level 1 (Yellow)   Horizontal ABduction 20 reps   Theraband Level (Shoulder Horizontal ABduction) Level 1 (Yellow)   External Rotation 20 reps   Theraband Level (Shoulder External Rotation) Level 1 (Yellow)   Other Standing Exercises Wall slides,  circles and abduction bilaterally   Other Standing Exercises money red band x 20   Shoulder Exercises: ROM/Strengthening   UBE (Upper Arm Bike) level 3 x 2 minutes, then used NuStep L5 x 5 minutes   Cybex Row 20 reps   Cybex Row Limitations 10#   "W" Arms 15   X to V Arms 15   Other ROM/Strengthening Exercises latt pull 10#  2 sets 10   Manual Therapy   Manual Therapy Soft tissue mobilization   Soft tissue mobilization left shoulder and upper arm                  PT Short Term Goals - 10/25/14 1132    PT SHORT TERM GOAL #1   Title independent with initial HEP   Time 1   Period Weeks   Status New           PT Long Term Goals - 10/25/14 1132    PT LONG TERM GOAL #1   Title decrease pain 50%   Time 8   Period Weeks   Status New   PT LONG TERM GOAL #2   Title increase AROM of the left shoulder to 130 degrees flexion   Time 8   Period  Weeks   Status New   PT LONG TERM GOAL #3   Title report 50% easier to dress   Time 8   Period Weeks   Status New   PT LONG TERM GOAL #4   Title report no difficulty doing her hair   Time 8   Period Weeks   Status New   PT LONG TERM GOAL #5   Title increase AROM of left shoulder IR to 55 degrees   Time 8   Period Weeks   Status New               Plan - 11/03/14 1144    Clinical Impression Statement Very good ROM of the shoulder, has difficulty with weight away from body.  tight in the upper arm.     PT Next Visit Plan Continue supine exercises until form improves. Progress HEP as tolerated.   Consulted and Agree with Plan of Care Patient        Problem List Patient Active Problem List   Diagnosis Date Noted  . Callus of foot 11/24/2012  . Pain in joint, ankle and foot 09/15/2012    Jearld LeschALBRIGHT,MICHAEL W., PT 11/03/2014, 11:49 AM  [facility] [address] [place], [place], [address] Phone: [phone]   Fax:  [phone]

## 2014-11-06 ENCOUNTER — Ambulatory Visit: Payer: Medicare Other | Admitting: Physical Therapy

## 2014-11-06 DIAGNOSIS — M25612 Stiffness of left shoulder, not elsewhere classified: Secondary | ICD-10-CM

## 2014-11-06 DIAGNOSIS — M25512 Pain in left shoulder: Secondary | ICD-10-CM | POA: Diagnosis not present

## 2014-11-06 DIAGNOSIS — M25611 Stiffness of right shoulder, not elsewhere classified: Secondary | ICD-10-CM

## 2014-11-06 DIAGNOSIS — M25511 Pain in right shoulder: Secondary | ICD-10-CM

## 2014-11-06 NOTE — Therapy (Signed)
[facility] [address] [place], [name], [address] Phone: [phone]   Fax:  [phone]  Physical Therapy Treatment  Patient Details  Name: Danielle Dean MRN: [id number] Date of Birth: January 12, 1929 Referring Provider:  Pollyann Savoy, MD  Encounter Date: 11/06/2014      PT End of Session - 11/06/14 1527    Visit Number 5   Date for PT Re-Evaluation 12/25/14   PT Start Time 1445   PT Stop Time 1526   PT Time Calculation (min) 41 min   Activity Tolerance Patient tolerated treatment well   Behavior During Therapy [facility] for tasks assessed/performed      Past Medical History  Diagnosis Date  . Hypertension   . Corns and callosities   . Callus     No past surgical history on file.  There were no vitals filed for this visit.  Visit Diagnosis:  Left shoulder pain  Decreased ROM of left shoulder  Decreased ROM of right shoulder  Right shoulder pain      Subjective Assessment - 11/06/14 1447    Subjective still having pain with L shoulder movement   Patient Stated Goals no pain, easier to dress and do hair   Currently in Pain? No/denies                         [facility] Adult PT Treatment/Exercise - 11/06/14 1448    Shoulder Exercises: Supine   Horizontal ABduction Both;20 reps;Theraband   Theraband Level (Shoulder Horizontal ABduction) Level 2 (Red)   Flexion Strengthening;Both;10 reps;Weights   Shoulder Flexion Weight (lbs) 2   Other Supine Exercises diagonals x 20 bil with red tband and mod cues for technique   Shoulder Exercises: Sidelying   External Rotation Strengthening;Both;10 reps;Weights   External Rotation Weight (lbs) 2   ABduction Strengthening;Both;10 reps;Weights   ABduction Weight (lbs) 2   Shoulder Exercises: ROM/Strengthening   UBE (Upper Arm Bike) NuStep Level 5 x 8 min   Ultrasound   Ultrasound Location L shoulder   Ultrasound Parameters 100% DC, 1 mHz, 1.2  w/cm2 x 8 min   Ultrasound Goals Pain                  PT Short Term Goals - 10/25/14 1132    PT SHORT TERM GOAL #1   Title independent with initial HEP   Time 1   Period Weeks   Status New           PT Long Term Goals - 10/25/14 1132    PT LONG TERM GOAL #1   Title decrease pain 50%   Time 8   Period Weeks   Status New   PT LONG TERM GOAL #2   Title increase AROM of the left shoulder to 130 degrees flexion   Time 8   Period Weeks   Status New   PT LONG TERM GOAL #3   Title report 50% easier to dress   Time 8   Period Weeks   Status New   PT LONG TERM GOAL #4   Title report no difficulty doing her hair   Time 8   Period Weeks   Status New   PT LONG TERM GOAL #5   Title increase AROM of left shoulder IR to 55 degrees   Time 8   Period Weeks   Status New  Plan - 11/06/14 1527    Clinical Impression Statement Pt needs mod cues for correct technique with exercises but continues to improve with strengthening activities.     PT Next Visit Plan Continue supine exercises until form improves. Progress HEP as tolerated.   Consulted and Agree with Plan of Care Patient        Problem List Patient Active Problem List   Diagnosis Date Noted  . Callus of foot 11/24/2012  . Pain in joint, ankle and foot 09/15/2012   Clarita CraneStephanie F Carime Dinkel, PT, DPT 11/06/2014 3:29 PM  [facility] [address] [place], [place], [address] Phone: [phone]   Fax:  [phone]

## 2014-11-10 ENCOUNTER — Ambulatory Visit: Payer: Medicare Other | Admitting: Physical Therapy

## 2014-11-10 DIAGNOSIS — M25611 Stiffness of right shoulder, not elsewhere classified: Secondary | ICD-10-CM

## 2014-11-10 DIAGNOSIS — M25512 Pain in left shoulder: Secondary | ICD-10-CM | POA: Diagnosis not present

## 2014-11-10 DIAGNOSIS — M25612 Stiffness of left shoulder, not elsewhere classified: Secondary | ICD-10-CM

## 2014-11-10 DIAGNOSIS — M25511 Pain in right shoulder: Secondary | ICD-10-CM

## 2014-11-10 NOTE — Therapy (Signed)
[facility] [address] [place], [name], [address] Phone: [phone]   Fax:  [phone]  Physical Therapy Treatment  Patient Details  Name: Danielle Dean MRN: [id number] Date of Birth: [birth year] Referring Provider:  Pollyann Savoy, MD  Encounter Date: 11/10/2014      PT End of Session - 11/10/14 1109    Visit Number 6   Date for PT Re-Evaluation 12/25/14   PT Start Time 1020   PT Stop Time 1100   PT Time Calculation (min) 40 min   Activity Tolerance Patient tolerated treatment well   Behavior During Therapy [facility] for tasks assessed/performed      Past Medical History  Diagnosis Date   Hypertension    Corns and callosities    Callus     No past surgical history on file.  There were no vitals filed for this visit.  Visit Diagnosis:  Left shoulder pain  Decreased ROM of left shoulder  Decreased ROM of right shoulder  Right shoulder pain      Subjective Assessment - 11/10/14 1107    Subjective Cont with some Left shoulder pain with activity.  Feels it is getting better.   Limitations Lifting;House hold activities   Pain Score 1    Pain Location Shoulder   Pain Orientation Left                         [facility] Adult PT Treatment/Exercise - 11/10/14 0001    Shoulder Exercises: Seated   Row 20 reps   Theraband Level (Shoulder Row) Level 3 (Green)   Horizontal ABduction 20 reps   External Rotation 20 reps   Theraband Level (Shoulder External Rotation) Level 3 (Green)   Shoulder Exercises: Sidelying   ABduction Strengthening;Both;10 reps;Weights   ABduction Weight (lbs) 1   Shoulder Exercises: Standing   Protraction 20 reps   Theraband Level (Shoulder Protraction) Level 2 (Red)   Horizontal ABduction 20 reps   Theraband Level (Shoulder Horizontal ABduction) Level 1 (Yellow)   External Rotation 20 reps   Theraband Level (Shoulder External Rotation) Level 1 (Yellow)   Other Standing Exercises Wall slides, circles and abduction bilaterally   Shoulder Exercises: ROM/Strengthening   UBE (Upper Arm Bike) NuStep Level 5 x 8 min   Cybex Row 20 reps   Cybex Row Limitations 10#   Other ROM/Strengthening Exercises latt pull 10#  2 sets 10   Ultrasound   Ultrasound Location L shoulder   Ultrasound Parameters 100% dc, 1 mHz .2w/ cm2   Ultrasound Goals Pain                  PT Short Term Goals - 10/25/14 1132    PT SHORT TERM GOAL #1   Title independent with initial HEP   Time 1   Period Weeks   Status New           PT Long Term Goals - 10/25/14 1132    PT LONG TERM GOAL #1   Title decrease pain 50%   Time 8   Period Weeks   Status New   PT LONG TERM GOAL #2   Title increase AROM of the left shoulder to 130 degrees flexion   Time 8   Period Weeks   Status New   PT LONG TERM GOAL #3   Title report 50% easier to dress   Time 8   Period Weeks   Status New  PT LONG TERM GOAL #4   Title report no difficulty doing her hair   Time 8   Period Weeks   Status New   PT LONG TERM GOAL #5   Title increase AROM of left shoulder IR to 55 degrees   Time 8   Period Weeks   Status New               Plan - 11/10/14 1109    Clinical Impression Statement Cues to slow speed of PREs.  Good ROM, quick to fatigue   Pt will benefit from skilled therapeutic intervention in order to improve on the following deficits Decreased range of motion;Decreased strength;Increased muscle spasms;Pain   Rehab Potential Good   PT Treatment/Interventions Cryotherapy;Ultrasound;Moist Heat;Electrical Stimulation;Therapeutic exercise;Manual techniques;Patient/family education;Passive range of motion   PT Next Visit Plan Continue supine exercises until form improves. Progress HEP as tolerated.        Problem List Patient Active Problem List   Diagnosis Date Noted   Callus of foot 11/24/2012   Pain in joint, ankle and foot 09/15/2012    Tomie ChinaLarry C  Clements, PTA 11/10/2014, 11:12 AM  [facility] [address] [place], [place], [address] Phone: [phone]   Fax:  [phone]

## 2014-11-16 ENCOUNTER — Ambulatory Visit: Payer: Medicare Other | Attending: Rheumatology | Admitting: Rehabilitation

## 2014-11-16 DIAGNOSIS — M25612 Stiffness of left shoulder, not elsewhere classified: Secondary | ICD-10-CM

## 2014-11-16 DIAGNOSIS — M25511 Pain in right shoulder: Secondary | ICD-10-CM | POA: Diagnosis present

## 2014-11-16 DIAGNOSIS — M7582 Other shoulder lesions, left shoulder: Secondary | ICD-10-CM | POA: Diagnosis present

## 2014-11-16 DIAGNOSIS — M25512 Pain in left shoulder: Secondary | ICD-10-CM | POA: Diagnosis present

## 2014-11-16 NOTE — Therapy (Signed)
[facility] [address] [place], [name], [address] Phone: [phone]   Fax:  [phone]  Physical Therapy Treatment  Patient Details  Name: Danielle Dean MRN: [id number] Date of Birth: Nov 29, 1928 Referring Provider:  Pollyann Savoy, MD  Encounter Date: 11/16/2014      PT End of Session - 11/16/14 1155    Visit Number 7   PT Start Time 1100   PT Stop Time 1145   PT Time Calculation (min) 45 min      Past Medical History  Diagnosis Date  . Hypertension   . Corns and callosities   . Callus     No past surgical history on file.  There were no vitals filed for this visit.  Visit Diagnosis:  Left shoulder pain  Decreased ROM of left shoulder      Subjective Assessment - 11/16/14 1153    Subjective Still c/o some L shoulder pain with Abduction and activity in frontal plane.    Overall feels it has improved, though   Limitations Lifting;House hold activities   Currently in Pain? Yes   Pain Score 2    Pain Location Shoulder   Pain Orientation Left   Pain Descriptors / Indicators Sore   Pain Type Chronic pain   Pain Onset More than a month ago   Pain Frequency Intermittent                         [facility] Adult PT Treatment/Exercise - 11/16/14 0001    Shoulder Exercises: Seated   Extension 20 reps   Theraband Level (Shoulder Extension) Level 3 (Green)   Row 20 reps   Theraband Level (Shoulder Row) Level 3 (Green)   Horizontal ABduction 20 reps   Horizontal ABduction Weight (lbs) yellow   External Rotation 20 reps   Theraband Level (Shoulder External Rotation) Level 3 (Green)   Other Seated Exercises Yellow TB L shldr:  seated pulldown, D2 PNF diagonal x 20   Other Seated Exercises Tower unilateral pulldowns forward, side x 20 L   Shoulder Exercises: ROM/Strengthening   UBE (Upper Arm Bike) Nustep L 4 x 8' BUE/BLE   Cybex Row 20 reps   Cybex Row Limitations 10   Other  ROM/Strengthening Exercises Lat PD 5# x 20   Ultrasound   Ultrasound Location L shoulder   Ultrasound Parameters 100% x 1 mhz x 1.3 w/cm2 x 10'   Ultrasound Goals Pain                  PT Short Term Goals - 10/25/14 1132    PT SHORT TERM GOAL #1   Title independent with initial HEP   Time 1   Period Weeks   Status New           PT Long Term Goals - 10/25/14 1132    PT LONG TERM GOAL #1   Title decrease pain 50%   Time 8   Period Weeks   Status New   PT LONG TERM GOAL #2   Title increase AROM of the left shoulder to 130 degrees flexion   Time 8   Period Weeks   Status New   PT LONG TERM GOAL #3   Title report 50% easier to dress   Time 8   Period Weeks   Status New   PT LONG TERM GOAL #4   Title report no difficulty doing her hair   Time  8   Period Weeks   Status New   PT LONG TERM GOAL #5   Title increase AROM of left shoulder IR to 55 degrees   Time 8   Period Weeks   Status New               Plan - 11/16/14 1156    Clinical Impression Statement Continues to need manual cueing for correct exercise speed, form, and correct performance.        Rehab Potential Good   PT Next Visit Plan Continue to teach patient proper form for exercise.    Still has difficulty and is quick/impulsive with ex.    Tends to do quickly and incorrectly        Problem List Patient Active Problem List   Diagnosis Date Noted  . Callus of foot 11/24/2012  . Pain in joint, ankle and foot 09/15/2012    Jaquin Coy, PT 11/16/2014, 12:00 PM  [facility] [address] [place], [place], [address] Phone: [phone]   Fax:  [phone]

## 2014-11-20 ENCOUNTER — Ambulatory Visit: Payer: Medicare Other

## 2014-11-21 ENCOUNTER — Ambulatory Visit: Payer: Medicare Other | Admitting: Rehabilitation

## 2014-11-21 DIAGNOSIS — M25512 Pain in left shoulder: Secondary | ICD-10-CM | POA: Diagnosis not present

## 2014-11-21 NOTE — Patient Instructions (Signed)
ROM: Saw (Protraction / Retraction)   Reach right arm out in front, then pull arm back, pinching shoulder blades together. Repeat ____ times per set. Do ____ sets per session. Do ____ sessions per day.  http://orth.exer.us/797   Copyright  VHI. All rights reserved.  ROM: Flexion - Wand (Supine)   Lie on back holding wand. Raise arms over head.  Repeat ____ times per set. Do ____ sets per session. Do ____ sessions per day.  http://orth.exer.us/929   Copyright  VHI. All rights reserved.  ROM: Extension (Standing)   Bring arms straight back as far as possible without pain. Repeat ____ times per set. Do ____ sets per session. Do ____ sessions per day.  http://orth.exer.us/917   Copyright  VHI. All rights reserved.  ROM: Cross (Horizontal Abduction / Adduction)   Reach right arm across body as far as possible, then pull arm out from side. Repeat ____ times per set. Do ____ sets per session. Do ____ sessions per day.  http://orth.exer.us/799   Copyright  VHI. All rights reserved.  External Rotation (Eccentric), Active - Supine   Lie on back, affected arm out from side, elbow at 90, forearm forward. Lift forearm of affected arm to neutral. Slowly lower for 3-5 seconds. ___ reps per set, ___ sets per day, ___ days per week. Add ___ lbs when you achieve ___ repetitions.  Copyright  VHI. All rights reserved.

## 2014-11-21 NOTE — Therapy (Signed)
[facility] [address] [place], [place], [address] Phone: [phone]   Fax:  [phone]  Physical Therapy Treatment  Patient Details  Name: Danielle KehrKarma V Littler MRN: [id number] Date of Birth: 10/25/1928 Referring Provider:  Pollyann Savoyeveshwar, Shaili, MD  Encounter Date: 11/21/2014      PT End of Session - 11/21/14 1200    Visit Number 8   PT Start Time 1100   PT Stop Time 1145   PT Time Calculation (min) 45 min      Past Medical History  Diagnosis Date  . Hypertension   . Corns and callosities   . Callus     No past surgical history on file.  There were no vitals filed for this visit.  Visit Diagnosis:  Left shoulder pain                       [facility] Adult PT Treatment/Exercise - 11/21/14 0001    Shoulder Exercises: Supine   Protraction Weight (lbs) 1#   Protraction Limitations press x 20   External Rotation Weight (lbs) 1#   External Rotation Limitations x 20 @side ; x 20 @ 90/90   Shoulder Flexion Weight (lbs) 1#   Flexion Limitations x20   Shoulder Exercises: Standing   Row Weight (lbs) 1 pl   Row Limitations x20   Other Standing Exercises 2# bent over rows, extension, HABD x 20   Other Standing Exercises 1pl seated tower pull down x 20   Modalities   Modalities Moist Heat   Moist Heat Therapy   Number Minutes Moist Heat 15 Minutes   Moist Heat Location Shoulder                PT Education - 11/21/14 1159    Education provided Yes   Education Details HEP handouts (supine and standing therex)   Person(s) Educated Patient   Methods Explanation;Demonstration;Handout   Comprehension Returned demonstration;Verbal cues required;Tactile cues required          PT Short Term Goals - 10/25/14 1132    PT SHORT TERM GOAL #1   Title independent with initial HEP   Time 1   Period Weeks   Status New           PT Long Term Goals - 10/25/14 1132    PT LONG TERM GOAL #1   Title decrease pain 50%   Time 8   Period Weeks   Status New   PT LONG TERM GOAL #2   Title increase AROM of the left shoulder to 130 degrees flexion   Time 8   Period Weeks   Status New   PT LONG TERM GOAL #3   Title report 50% easier to dress   Time 8   Period Weeks   Status New   PT LONG TERM GOAL #4   Title report no difficulty doing her hair   Time 8   Period Weeks   Status New   PT LONG TERM GOAL #5   Title increase AROM of left shoulder IR to 55 degrees   Time 8   Period Weeks   Status New               Plan - 11/21/14 1201    Clinical Impression Statement Focus today on supine/standing shoulder exercises for HEP at home with soup can.    Handouts given, but she needs further review.  v/c and manual cues still required  for correct speed and performance.    She requested dc [place] to see how painf levell does   PT Next Visit Plan continue to work toward independence with all HEP with appropriate form and speed        Problem List Patient Active Problem List   Diagnosis Date Noted  . Callus of foot 11/24/2012  . Pain in joint, ankle and foot 09/15/2012    Kahlin Mark, PT 11/21/2014, 12:03 PM  [facility] [address] [place], [name], [address] Phone: [phone]   Fax:  [phone]

## 2014-11-24 ENCOUNTER — Ambulatory Visit: Payer: Medicare Other | Admitting: Physical Therapy

## 2014-11-24 DIAGNOSIS — M25611 Stiffness of right shoulder, not elsewhere classified: Secondary | ICD-10-CM

## 2014-11-24 DIAGNOSIS — M25511 Pain in right shoulder: Secondary | ICD-10-CM

## 2014-11-24 DIAGNOSIS — M25512 Pain in left shoulder: Secondary | ICD-10-CM

## 2014-11-24 DIAGNOSIS — M25612 Stiffness of left shoulder, not elsewhere classified: Secondary | ICD-10-CM

## 2014-11-24 NOTE — Therapy (Signed)
[place], [place], [address] Phone: [phone]   Fax:  [phone]  Physical Therapy Treatment  Patient Details  Name: Danielle Dean MRN: [id number] Date of Birth: [birth year] Referring Provider:  Bo Merino, MD  Encounter Date: 11/24/2014      PT End of Session - 11/24/14 1153    Visit Number 9   Date for PT Re-Evaluation 12/25/14   PT Start Time 1059   PT Stop Time 1154   PT Time Calculation (min) 55 min   Activity Tolerance Patient tolerated treatment well      Past Medical History  Diagnosis Date  . Hypertension   . Corns and callosities   . Callus     No past surgical history on file.  There were no vitals filed for this visit.  Visit Diagnosis:  Left shoulder pain  Right shoulder pain  Decreased ROM of left shoulder  Decreased ROM of right shoulder                       [facility] Adult PT Treatment/Exercise - 11/24/14 0001    Shoulder Exercises: Supine   Protraction Weight (lbs) 2#   Protraction Limitations press x 20   External Rotation Weight (lbs) 2#   External Rotation Limitations x 20 _0 ; x 20 @ 90/90   Other Supine Exercises overhead pull overs 2#   Shoulder Exercises: Seated   Extension 20 reps   Extension Weight (lbs) 3   Row 20 reps   Row Weight (lbs) 3   Other Seated Exercises 3# biceps curls   Shoulder Exercises: Standing   Other Standing Exercises 20# triceps   Other Standing Exercises 10# seated row, 10 # lat pulls   Moist Heat Therapy   Number Minutes Moist Heat 15 Minutes   Moist Heat Location Shoulder  bilateral                  PT Short Term Goals - 10/25/14 1132    PT SHORT TERM GOAL #1   Title independent with initial HEP   Time 1   Period Weeks   Status New           PT Long Term Goals - 11/24/14 1154    PT LONG TERM GOAL #1   Title decrease pain 50%   Status Partially Met   PT LONG TERM  GOAL #2   Title increase AROM of the left shoulder to 130 degrees flexion   Status On-going   PT LONG TERM GOAL #3   Title report 50% easier to dress   Status Partially Met   PT LONG TERM GOAL #4   Title report no difficulty doing her hair   Status On-going   PT LONG TERM GOAL #5   Title increase AROM of left shoulder IR to 55 degrees   Status On-going               Plan - 11/24/14 1154    Clinical Impression Statement Reports that the Hot packs did better, she is weak and painful with activities away from the body bilaterally   PT Next Visit Plan continue to work toward independence with all HEP with appropriate form and speed   Consulted and Agree with Plan of Care Patient        Problem List Patient Active Problem List   Diagnosis Date Noted  . Callus of foot 11/24/2012  .  Pain in joint, ankle and foot 09/15/2012    Sumner Boast., PT 11/24/2014, 11:56 AM  [place] [address] [place], [place], [address] Phone: [phone]   Fax:  [phone]

## 2014-11-27 ENCOUNTER — Encounter: Payer: Self-pay | Admitting: Podiatry

## 2014-11-27 ENCOUNTER — Ambulatory Visit (INDEPENDENT_AMBULATORY_CARE_PROVIDER_SITE_OTHER): Payer: Medicare Other | Admitting: Podiatry

## 2014-11-27 VITALS — BP 155/73 | HR 56 | Resp 15

## 2014-11-27 DIAGNOSIS — L84 Corns and callosities: Secondary | ICD-10-CM

## 2014-11-28 ENCOUNTER — Encounter: Payer: Self-pay | Admitting: Physical Therapy

## 2014-11-28 ENCOUNTER — Ambulatory Visit: Payer: Medicare Other | Admitting: Physical Therapy

## 2014-11-28 DIAGNOSIS — M25512 Pain in left shoulder: Secondary | ICD-10-CM | POA: Diagnosis not present

## 2014-11-28 DIAGNOSIS — M25612 Stiffness of left shoulder, not elsewhere classified: Secondary | ICD-10-CM

## 2014-11-28 DIAGNOSIS — M25611 Stiffness of right shoulder, not elsewhere classified: Secondary | ICD-10-CM

## 2014-11-28 DIAGNOSIS — M25511 Pain in right shoulder: Secondary | ICD-10-CM

## 2014-11-28 NOTE — Progress Notes (Signed)
Subjective:     Patient ID: Danielle Dean, female   DOB: 06-20-1928, 81 y.o.   MRN: [id number]  HPI patient presents with calluses of both feet left over right fifth toe that are very painful   Review of Systems     Objective:   Physical Exam Neurovascular status intact with keratotic lesions fifth toe bilateral fifth metatarsal bilateral    Assessment:     Lesion secondary to bone structure    Plan:     Debride lesion on both feet no iatrogenic bleeding noted

## 2014-11-28 NOTE — Therapy (Signed)
[facility] [address] [place], [place], [address] Phone: [phone]   Fax:  [phone]  Physical Therapy Treatment  Patient Details  Name: Danielle Dean MRN: [id number] Date of Birth: 12/12/1928 Referring Provider:  Husain, Karrar, MD  Encounter Date: 11/28/2014      PT End of Session - 11/28/14 1135    Visit Number 10   Date for PT Re-Evaluation 12/25/14   PT Start Time 1055   PT Stop Time 1155   PT Time Calculation (min) 60 min      Past Medical History  Diagnosis Date  . Hypertension   . Corns and callosities   . Callus     History reviewed. No pertinent past surgical history.  There were no vitals filed for this visit.  Visit Diagnosis:  Left shoulder pain  Right shoulder pain  Decreased ROM of left shoulder  Decreased ROM of right shoulder      Subjective Assessment - 11/28/14 1057    Subjective shoulders getting better, knees bother me too   Currently in Pain? Yes   Pain Score 2    Pain Location Shoulder                         [facility] Adult PT Treatment/Exercise - 11/28/14 0001    Shoulder Exercises: Seated   Extension Strengthening;Both;10 reps  2 sets 3#   Row Strengthening;Both;10 reps  2 sets 3 #   Horizontal ABduction Strengthening;Both;10 reps  2 sets 3#   Other Seated Exercises UBE 2 fwd/2 backward. Nustep L 4 6 min   Shoulder Exercises: Standing   External Rotation Strengthening;Both;10 reps  2 sets  2#   Other Standing Exercises PNF 2 sets 10 no weight   Shoulder Exercises: ROM/Strengthening   Cybex Row 20 reps  15#   Other ROM/Strengthening Exercises Lat PD 15# x 20   Modalities   Modalities Iontophoresis   Moist Heat Therapy   Number Minutes Moist Heat 15 Minutes   Moist Heat Location Shoulder   Iontophoresis   Type of Iontophoresis Dexamethasone   Location left ant shld   Dose 1.2 CC  80 MA    Time 4 hour patch   Manual Therapy   Manual  Therapy Soft tissue mobilization;Passive ROM                  PT Short Term Goals - 11/28/14 1136    PT SHORT TERM GOAL #1   Title independent with initial HEP   Status Achieved           PT Long Term Goals - 11/24/14 1154    PT LONG TERM GOAL #1   Title decrease pain 50%   Status Partially Met   PT LONG TERM GOAL #2   Title increase AROM of the left shoulder to 130 degrees flexion   Status On-going   PT LONG TERM GOAL #3   Title report 50% easier to dress   Status Partially Met   PT LONG TERM GOAL #4   Title report no difficulty doing her hair   Status On-going   PT LONG TERM GOAL #5   Title increase AROM of left shoulder IR to 55 degrees   Status On-going               Plan - 11/28/14 1135    Clinical Impression Statement added ionto to left ant shld, as bicep tendo   very tendon and "pops" with mvmt. tolerated therapy well but very weak   PT Next Visit Plan assess ionto        Problem List Patient Active Problem List   Diagnosis Date Noted  . Callus of foot 11/24/2012  . Pain in joint, ankle and foot 09/15/2012  Okay to add Iontophoresis with dexamethasone.  Danielle Dean,Danielle Dean  PTA , Danielle Dean, PT 11/28/2014, 11:38 AM  [place], [place], [address] Phone: [phone]   Fax:  [phone]

## 2014-12-01 ENCOUNTER — Ambulatory Visit: Payer: Medicare Other | Admitting: Physical Therapy

## 2014-12-01 ENCOUNTER — Encounter: Payer: Self-pay | Admitting: Physical Therapy

## 2014-12-01 DIAGNOSIS — M25611 Stiffness of right shoulder, not elsewhere classified: Secondary | ICD-10-CM

## 2014-12-01 DIAGNOSIS — M25511 Pain in right shoulder: Secondary | ICD-10-CM

## 2014-12-01 DIAGNOSIS — M25612 Stiffness of left shoulder, not elsewhere classified: Secondary | ICD-10-CM

## 2014-12-01 DIAGNOSIS — M25512 Pain in left shoulder: Secondary | ICD-10-CM | POA: Diagnosis not present

## 2014-12-01 NOTE — Therapy (Signed)
[place], [place], [address] Phone: [phone]   Fax:  [phone]  Physical Therapy Treatment  Patient Details  Name: Danielle Dean MRN: [id number] Date of Birth: Jul 27, 1928 Referring Provider:  Bo Merino, MD  Encounter Date: 12/01/2014      PT End of Session - 12/01/14 1043    Visit Number 11   Date for PT Re-Evaluation 12/25/14   PT Start Time 1003   PT Stop Time 1100   PT Time Calculation (min) 57 min      Past Medical History  Diagnosis Date  . Hypertension   . Corns and callosities   . Callus     History reviewed. No pertinent past surgical history.  There were no vitals filed for this visit.  Visit Diagnosis:  Left shoulder pain  Right shoulder pain  Decreased ROM of left shoulder  Decreased ROM of right shoulder      Subjective Assessment - 12/01/14 1005    Subjective feeling better, using it more, just gets tired easily   Currently in Pain? No/denies            [facility] PT Assessment - 12/01/14 0001    AROM   Right Shoulder Flexion 90 Degrees   Right Shoulder ABduction 80 Degrees   Right Shoulder Internal Rotation 70 Degrees   Right Shoulder External Rotation 40 Degrees   Left Shoulder Flexion 160 Degrees   Left Shoulder ABduction 140 Degrees   Left Shoulder Internal Rotation 55 Degrees   Left Shoulder External Rotation 80 Degrees                     [facility] Adult PT Treatment/Exercise - 12/01/14 0001    Shoulder Exercises: Supine   Other Supine Exercises 3# cane ex flex,chest press, abd/add 15 times each   Shoulder Exercises: Standing   Other Standing Exercises 3# cane ext and IR 15 reps   Shoulder Exercises: Therapy Ball   Flexion 15 reps   Shoulder Exercises: ROM/Strengthening   UBE (Upper Arm Bike) 2 fwd/2 back L 3   Cybex Row --  2 sets 10 15 #, lat pull 15 # 2 sets 10   Wall Pushups 20 reps  with ball on wall   Other  ROM/Strengthening Exercises Nustep L 4 8 min   Iontophoresis   Type of Iontophoresis Dexamethasone   Location left ant shld   Dose 1.2 CC  80 MA    Time 4 hour patch   Manual Therapy   Manual Therapy Passive ROM  left and rt shld                  PT Short Term Goals - 11/28/14 1136    PT SHORT TERM GOAL #1   Title independent with initial HEP   Status Achieved           PT Long Term Goals - 12/01/14 1055    PT LONG TERM GOAL #1   Title decrease pain 50%   Status Partially Met   PT LONG TERM GOAL #2   Title increase AROM of the left shoulder to 130 degrees flexion   Status Achieved   PT LONG TERM GOAL #3   Title report 50% easier to dress   Status Achieved   PT LONG TERM GOAL #4   Title report no difficulty doing her hair   Status Achieved   PT LONG TERM GOAL #5  Title increase AROM of left shoulder IR to 55 degrees   Status Achieved               Plan - 12/01/14 1043    Clinical Impression Statement continued with ionto to decrease inflammation over bicep tendo to decrease poping with mvmt. increased ROM but weak and fatigues easily with resistance.   PT Next Visit Plan progress functioal strength        Problem List Patient Active Problem List   Diagnosis Date Noted  . Callus of foot 11/24/2012  . Pain in joint, ankle and foot 09/15/2012    PAYSEUR,ANGIE PTA  12/01/2014, 10:57 AM  [place] [address] [place], [place], [address] Phone: [phone]   Fax:  [phone]

## 2014-12-06 ENCOUNTER — Encounter: Payer: Self-pay | Admitting: Physical Therapy

## 2014-12-06 ENCOUNTER — Ambulatory Visit: Payer: Medicare Other | Admitting: Physical Therapy

## 2014-12-06 DIAGNOSIS — M25512 Pain in left shoulder: Secondary | ICD-10-CM | POA: Diagnosis not present

## 2014-12-06 DIAGNOSIS — M25611 Stiffness of right shoulder, not elsewhere classified: Secondary | ICD-10-CM

## 2014-12-06 DIAGNOSIS — M25612 Stiffness of left shoulder, not elsewhere classified: Secondary | ICD-10-CM

## 2014-12-06 DIAGNOSIS — M25511 Pain in right shoulder: Secondary | ICD-10-CM

## 2014-12-06 NOTE — Therapy (Signed)
[place], [place], [address] Phone: [phone]   Fax:  [phone]  Physical Therapy Treatment  Patient Details  Name: Danielle Dean MRN: [id number] Date of Birth: [birth year] Referring Provider:  Bo Merino, MD  Encounter Date: 12/06/2014      PT End of Session - 12/06/14 1144    Visit Number 12   PT Start Time 1057   PT Stop Time 1130   PT Time Calculation (min) 33 min   Behavior During Therapy [facility] for tasks assessed/performed      Past Medical History  Diagnosis Date  . Hypertension   . Corns and callosities   . Callus     History reviewed. No pertinent past surgical history.  There were no vitals filed for this visit.  Visit Diagnosis:  Left shoulder pain  Right shoulder pain  Decreased ROM of left shoulder  Decreased ROM of right shoulder      Subjective Assessment - 12/06/14 1105    Subjective I still do not like the popping in my shoulders at times.  She does report that she understands that the stabilization exercises shoulder help decrease the popping.   Currently in Pain? No/denies                         [facility] Adult PT Treatment/Exercise - 12/06/14 0001    Shoulder Exercises: Supine   Protraction Weight (lbs) 2#   Protraction Limitations press x 20   External Rotation Both;20 reps;Theraband   Theraband Level (Shoulder External Rotation) Level 2 (Red)   Internal Rotation Both;20 reps;Theraband   Theraband Level (Shoulder Internal Rotation) Level 2 (Red)   Flexion Both;20 reps   Shoulder Flexion Weight (lbs) 1#, 2# and 3# in various angles   Other Supine Exercises 3# cane ex flex,chest press, abd/add 15 times each   Other Supine Exercises 2 and 3# punches and isometric circles   Shoulder Exercises: Seated   Other Seated Exercises 3# biceps curls   Other Seated Exercises UBE 2 fwd/2 backward. Nustep L 4 6 min   Shoulder Exercises:  Standing   Other Standing Exercises 3# overhead press   Other Standing Exercises 3# cane ext and IR 15 reps   Shoulder Exercises: Therapy Ball   Other Therapy Ball Exercises wall push with ball and 2 positions   Shoulder Exercises: ROM/Strengthening   "W" Arms 15   X to V Arms 15   Ball on Wall 20   Other ROM/Strengthening Exercises Lat PD 15# x 20, seated row   Other ROM/Strengthening Exercises Nustep L 4 8 min                  PT Short Term Goals - 11/28/14 1136    PT SHORT TERM GOAL #1   Title independent with initial HEP   Status Achieved           PT Long Term Goals - 12/06/14 1152    PT LONG TERM GOAL #2   Title increase AROM of the left shoulder to 130 degrees flexion   Status Partially Met               Plan - 12/06/14 1147    Clinical Impression Statement ROM and function seems to be improving.  She still is having some decreased ROM and function with overhead activities   PT Next Visit Plan continue to progress with functional activities and  strength   Consulted and Agree with Plan of Care Patient        Problem List Patient Active Problem List   Diagnosis Date Noted  . Callus of foot 11/24/2012  . Pain in joint, ankle and foot 09/15/2012    Sumner Boast., PT 12/06/2014, 11:54 AM  [place] [address] [place], [place], [address] Phone: [phone]   Fax:  [phone]

## 2014-12-08 ENCOUNTER — Ambulatory Visit: Payer: Medicare Other | Admitting: Physical Therapy

## 2014-12-08 DIAGNOSIS — M25611 Stiffness of right shoulder, not elsewhere classified: Secondary | ICD-10-CM

## 2014-12-08 DIAGNOSIS — M25612 Stiffness of left shoulder, not elsewhere classified: Secondary | ICD-10-CM

## 2014-12-08 DIAGNOSIS — M25512 Pain in left shoulder: Secondary | ICD-10-CM | POA: Diagnosis not present

## 2014-12-08 DIAGNOSIS — M25511 Pain in right shoulder: Secondary | ICD-10-CM

## 2014-12-08 NOTE — Therapy (Signed)
[place], [place], [address] Phone: [phone]   Fax:  [phone]  Physical Therapy Treatment  Patient Details  Name: KINJAL NEITZKE MRN: [id number] Date of Birth: 1928-10-20 Referring Provider:  Bo Merino, MD  Encounter Date: 12/08/2014      PT End of Session - 12/08/14 1056    Visit Number 13   Date for PT Re-Evaluation 12/25/14   PT Start Time 6962   PT Stop Time 1050   PT Time Calculation (min) 35 min   Activity Tolerance Patient tolerated treatment well   Behavior During Therapy [facility] for tasks assessed/performed      Past Medical History  Diagnosis Date   Hypertension    Corns and callosities    Callus     No past surgical history on file.  There were no vitals filed for this visit.  Visit Diagnosis:  Left shoulder pain  Right shoulder pain  Decreased ROM of left shoulder  Decreased ROM of right shoulder      Subjective Assessment - 12/08/14 1051    Subjective Feeling a little bit better.  Left shoulder is a little sore some days, less on others.  Left side pops with activity.   Limitations Lifting;House hold activities   Pain Score 2    Pain Orientation Left   Pain Descriptors / Indicators Sore                         [facility] Adult PT Treatment/Exercise - 12/08/14 0001    Shoulder Exercises: Supine   Protraction Weight (lbs) 2#   Protraction Limitations press x 20   External Rotation Both;20 reps;Theraband   Theraband Level (Shoulder External Rotation) Level 2 (Red)   Internal Rotation Both;20 reps;Theraband   Theraband Level (Shoulder Internal Rotation) Level 2 (Red)   Flexion Both;20 reps   Shoulder Exercises: Seated   Other Seated Exercises 2# biceps curl   Other Seated Exercises UBE 2 fwd/2 backward. Nustep L 4 6 min   Shoulder Exercises: Standing   Other Standing Exercises 2# overhead press   Other Standing Exercises 3# cane  ext and IR 15 reps   Shoulder Exercises: Therapy Ball   Other Therapy Ball Exercises wall push with ball and 2 positions   Shoulder Exercises: ROM/Strengthening   Wall Pushups 20 reps   "W" Arms 15   Ball on Wall 20   Other ROM/Strengthening Exercises Lat PD 15# x 20, seated row   Other ROM/Strengthening Exercises Nustep L 4 8 min   Shoulder Exercises: Power Psychologist, counselling press down 15# 2x15                  PT Short Term Goals - 11/28/14 1136    PT SHORT TERM GOAL #1   Title independent with initial HEP   Status Achieved           PT Long Term Goals - 12/06/14 1152    PT LONG TERM GOAL #2   Title increase AROM of the left shoulder to 130 degrees flexion   Status Partially Met               Plan - 12/08/14 1057    Clinical Impression Statement Left shoulder discomfort and weakness.  Difficulty with seated rows.  Left shouler elevated and anteriorly tilted.     PT Next Visit Plan continue to progress with  functional activities and strength        Problem List Patient Active Problem List   Diagnosis Date Noted   Callus of foot 11/24/2012   Pain in joint, ankle and foot 09/15/2012    Olean Ree, PTA 12/08/2014, 10:59 AM  [place], [place], [address] Phone: [phone]   Fax:  [phone]

## 2014-12-12 ENCOUNTER — Ambulatory Visit: Payer: Medicare Other | Admitting: Physical Therapy

## 2014-12-12 ENCOUNTER — Encounter: Payer: Self-pay | Admitting: Physical Therapy

## 2014-12-12 DIAGNOSIS — M25512 Pain in left shoulder: Secondary | ICD-10-CM

## 2014-12-12 DIAGNOSIS — M25612 Stiffness of left shoulder, not elsewhere classified: Secondary | ICD-10-CM

## 2014-12-12 DIAGNOSIS — M25611 Stiffness of right shoulder, not elsewhere classified: Secondary | ICD-10-CM

## 2014-12-12 DIAGNOSIS — M25511 Pain in right shoulder: Secondary | ICD-10-CM

## 2014-12-12 NOTE — Therapy (Signed)
[place], [place], [address] Phone: [phone]   Fax:  [phone]  Physical Therapy Treatment  Patient Details  Name: Danielle Dean MRN: [id number] Date of Birth: 07-18-1928 Referring Provider:  Bo Merino, MD  Encounter Date: 12/12/2014      PT End of Session - 12/12/14 1136    Visit Number 14   Date for PT Re-Evaluation 12/25/14   PT Start Time 1055   PT Stop Time 1150   PT Time Calculation (min) 55 min      Past Medical History  Diagnosis Date  . Hypertension   . Corns and callosities   . Callus     History reviewed. No pertinent past surgical history.  There were no vitals filed for this visit.  Visit Diagnosis:  Left shoulder pain  Right shoulder pain  Decreased ROM of left shoulder  Decreased ROM of right shoulder      Subjective Assessment - 12/12/14 1105    Subjective both shld bothering me, dont know why?   Currently in Pain? Yes   Pain Score 4    Pain Location Shoulder   Pain Orientation Right;Left                         [facility] Adult PT Treatment/Exercise - 12/12/14 0001    Shoulder Exercises: Seated   Other Seated Exercises 3# biceps curl  15 times palm up, 15 times 15 up   Other Seated Exercises UBE 2 fwd/2 backward. Nustep L 5 7 min   Shoulder Exercises: Standing   Extension Weight (lbs) 3#   Extension Limitations 15 times   Other Standing Exercises 5# pulleys 3 way 2 sets 10   Other Standing Exercises yellow tband IR/ER 2 sets 10   Shoulder Exercises: ROM/Strengthening   Ball on Wall 20   Other ROM/Strengthening Exercises Lat PD 10# x 20, seated row  complained that 15# was too much   Other ROM/Strengthening Exercises rolling ball up and down wall 15 times   Manual Therapy   Manual Therapy Passive ROM  left and rt shld                  PT Short Term Goals - 11/28/14 1136    PT SHORT TERM GOAL #1   Title  independent with initial HEP   Status Achieved           PT Long Term Goals - 12/06/14 1152    PT LONG TERM GOAL #2   Title increase AROM of the left shoulder to 130 degrees flexion   Status Partially Met               Plan - 12/12/14 1137    Clinical Impression Statement pt with good ROM on left shld, RT limited,weakness throughout both shlds with muscle atrophy noted. Pt complained for all ex today that weight was too much   PT Next Visit Plan check ROM and goals        Problem List Patient Active Problem List   Diagnosis Date Noted  . Callus of foot 11/24/2012  . Pain in joint, ankle and foot 09/15/2012    Danielle Dean,ANGIE PTA 12/12/2014, 11:38 AM  [place] [address] [place], [place], [address] Phone: [phone]   Fax:  [phone]

## 2014-12-15 ENCOUNTER — Ambulatory Visit: Payer: Medicare Other | Attending: Rheumatology | Admitting: Rehabilitation

## 2014-12-15 DIAGNOSIS — M7582 Other shoulder lesions, left shoulder: Secondary | ICD-10-CM | POA: Diagnosis present

## 2014-12-15 DIAGNOSIS — R29898 Other symptoms and signs involving the musculoskeletal system: Secondary | ICD-10-CM

## 2014-12-15 DIAGNOSIS — M25512 Pain in left shoulder: Secondary | ICD-10-CM

## 2014-12-15 DIAGNOSIS — M25511 Pain in right shoulder: Secondary | ICD-10-CM | POA: Diagnosis present

## 2014-12-15 NOTE — Therapy (Signed)
[facility] [place] [address] [place], [name], [address] Phone: [phone]   Fax:  [phone]  Physical Therapy Treatment  Patient Details  Name: Danielle Dean MRN: [id number] Date of Birth: 11-15-1928 Referring Provider:  Pollyann Savoy, MD  Encounter Date: 12/15/2014      PT End of Session - 12/15/14 1103    Visit Number 15   PT Start Time 1055   PT Stop Time 1145   PT Time Calculation (min) 50 min      Past Medical History  Diagnosis Date  . Hypertension   . Corns and callosities   . Callus     No past surgical history on file.  There were no vitals filed for this visit.  Visit Diagnosis:  Left shoulder pain  Weakness of shoulder                       [facility] Adult PT Treatment/Exercise - 12/15/14 0001    Shoulder Exercises: Supine   Protraction Weight (lbs) 2#   Protraction Limitations press up x 20   External Rotation 20 reps   Theraband Level (Shoulder External Rotation) Level 1 (Yellow)   External Rotation Weight (lbs) 2#    External Rotation Limitations 2way (DB and TB)   Flexion Both;20 reps   Shoulder Flexion Weight (lbs) 1# straight plan x 20   Other Supine Exercises 2# circles @ 90 x 20   Shoulder Exercises: Seated   Other Seated Exercises 3# bicep curl   Other Seated Exercises UBE 3'f/3'B;  Nustep x 8' L4   Shoulder Exercises: Standing   Other Standing Exercises wall ball circles   Other Standing Exercises Yell TB row unilat and bilat, IR x 20   Shoulder Exercises: Pulleys   Other Pulley Exercises unilat PD 10# x 20   Shoulder Exercises: Therapy Ball   Other Therapy Ball Exercises wall ball circles                  PT Short Term Goals - 11/28/14 1136    PT SHORT TERM GOAL #1   Title independent with initial HEP   Status Achieved           PT Long Term Goals - 12/15/14 1126    PT LONG TERM GOAL #1   Title pt. will be able to lift 1-5 lbs  overhead with LUE without pain for ability to do ADL   Time 4   Period Weeks   Status New   PT LONG TERM GOAL #2   Status Achieved               Plan - 12/15/14 1122    Clinical Impression Statement Pt. states feels a little better today than last visit.   L shoulder ROM is WNL in all planes:  170 deg flex/ABD, 75 deg ER.     Still having pain with shoulder elevated tasks LUE with 4/5 flexion and abduction strength L shoulder.    Both MMT's are painful as well.  New goals written for strength and pain.   Rehab Potential Good   PT Next Visit Plan still requires manual cueing to complete her HEP correctly.    Work on flexion/ABd strength.        Problem List Patient Active Problem List   Diagnosis Date Noted  . Callus of foot 11/24/2012  . Pain in joint, ankle and foot 09/15/2012    Chelsia Serres,  PT 12/15/2014, 11:38 AM  [facility] [address] [place], [place], [address] Phone: [phone]   Fax:  [phone]

## 2014-12-19 ENCOUNTER — Ambulatory Visit: Payer: Medicare Other | Admitting: Physical Therapy

## 2014-12-19 DIAGNOSIS — M25612 Stiffness of left shoulder, not elsewhere classified: Secondary | ICD-10-CM

## 2014-12-19 DIAGNOSIS — M25511 Pain in right shoulder: Secondary | ICD-10-CM

## 2014-12-19 DIAGNOSIS — M25512 Pain in left shoulder: Secondary | ICD-10-CM

## 2014-12-19 DIAGNOSIS — M25611 Stiffness of right shoulder, not elsewhere classified: Secondary | ICD-10-CM

## 2014-12-19 DIAGNOSIS — R29898 Other symptoms and signs involving the musculoskeletal system: Secondary | ICD-10-CM

## 2014-12-19 NOTE — Therapy (Signed)
[facility] [address] [place], [place], [address] Phone: [phone]   Fax:  [phone]  Physical Therapy Treatment  Patient Details  Name: Danielle KehrKarma V Thelander MRN: [id number] Date of Birth: 07/06/1928 Referring Provider:  Pollyann Savoyeveshwar, Shaili, MD  Encounter Date: 12/19/2014      PT End of Session - 12/19/14 1055    Visit Number 16   Date for PT Re-Evaluation 12/25/14   PT Start Time 1015   PT Stop Time 1054   PT Time Calculation (min) 39 min   Activity Tolerance Patient tolerated treatment well   Behavior During Therapy [facility] for tasks assessed/performed      Past Medical History  Diagnosis Date  . Hypertension   . Corns and callosities   . Callus     No past surgical history on file.  There were no vitals filed for this visit.  Visit Diagnosis:  Left shoulder pain  Weakness of shoulder  Right shoulder pain  Decreased ROM of left shoulder  Decreased ROM of right shoulder      Subjective Assessment - 12/19/14 1021    Subjective L shoulder still bothering pt with movement   Patient Stated Goals no pain, easier to dress and do hair   Currently in Pain? Yes   Pain Score 3    Pain Location Shoulder   Pain Orientation Left   Pain Descriptors / Indicators Sore   Pain Type Chronic pain   Pain Onset More than a month ago   Pain Frequency Intermittent   Aggravating Factors  worse with reaching, dressing, doing hair   Pain Relieving Factors injection, rest                         [facility] Adult PT Treatment/Exercise - 12/19/14 1022    Shoulder Exercises: Supine   Protraction Strengthening;Left;20 reps;Weights   Protraction Weight (lbs) 2#   External Rotation Strengthening;Both;20 reps   Theraband Level (Shoulder External Rotation) Level 1 (Yellow)   Internal Rotation Strengthening;Left;20 reps;Theraband   Theraband Level (Shoulder Internal Rotation) Level 1 (Yellow)   Flexion  Strengthening;Left;20 reps;Weights   Shoulder Flexion Weight (lbs) 1#   Other Supine Exercises chest press 2# x20   Other Supine Exercises 2# circles @ 90 x 20 each direction   Shoulder Exercises: Seated   Flexion 10 reps;Left;Weights   Flexion Weight (lbs) 1   Flexion Limitations 3 sets   Other Seated Exercises bicep curls 2# 3x10;    Other Seated Exercises Nustep x 8' L4   Shoulder Exercises: Sidelying   External Rotation Strengthening;Left;20 reps;Weights   External Rotation Weight (lbs) 1   Shoulder Exercises: ROM/Strengthening   UBE (Upper Arm Bike) L2 x 6 min; 3 min forward/3 min backward                  PT Short Term Goals - 11/28/14 1136    PT SHORT TERM GOAL #1   Title independent with initial HEP   Status Achieved           PT Long Term Goals - 12/15/14 1126    PT LONG TERM GOAL #1   Title pt. will be able to lift 1-5 lbs overhead with LUE without pain for ability to do ADL   Time 4   Period Weeks   Status New   PT LONG TERM GOAL #2   Status Achieved  Plan - 12/19/14 1055    Clinical Impression Statement Pt c/o muscle fatigue at end of session with strengthening exercises.  Pt very hesistant to progress weights and resistance further.  Need to determine renew v/s d/c next week depending on progress.   PT Next Visit Plan continue strengthening as pt tolerates   Consulted and Agree with Plan of Care Patient        Problem List Patient Active Problem List   Diagnosis Date Noted  . Callus of foot 11/24/2012  . Pain in joint, ankle and foot 09/15/2012   Clarita Crane, PT, DPT 12/19/2014 10:57 AM  [facility] [place] [address] [place], [name], [address] Phone: [phone]   Fax:  [phone]

## 2014-12-21 ENCOUNTER — Ambulatory Visit: Payer: Medicare Other | Admitting: Rehabilitation

## 2014-12-22 ENCOUNTER — Ambulatory Visit: Payer: Medicare Other | Admitting: Physical Therapy

## 2014-12-22 DIAGNOSIS — M25612 Stiffness of left shoulder, not elsewhere classified: Secondary | ICD-10-CM

## 2014-12-22 DIAGNOSIS — R29898 Other symptoms and signs involving the musculoskeletal system: Secondary | ICD-10-CM

## 2014-12-22 DIAGNOSIS — M25512 Pain in left shoulder: Secondary | ICD-10-CM

## 2014-12-22 DIAGNOSIS — M25511 Pain in right shoulder: Secondary | ICD-10-CM

## 2014-12-22 DIAGNOSIS — M25611 Stiffness of right shoulder, not elsewhere classified: Secondary | ICD-10-CM

## 2014-12-22 NOTE — Therapy (Signed)
[name], [name], [address] Phone: [phone]   Fax:  [phone]  Physical Therapy Treatment  Patient Details  Name: Danielle Dean MRN: [id number] Date of Birth: 11-02-1928 Referring Provider:  Pollyann Savoy, MD  Encounter Date: 12/22/2014      PT End of Session - 12/22/14 1029    Visit Number 17   Date for PT Re-Evaluation 12/25/14   PT Start Time 0945   PT Stop Time 1015   PT Time Calculation (min) 30 min      Past Medical History  Diagnosis Date   Hypertension    Corns and callosities    Callus     No past surgical history on file.  There were no vitals filed for this visit.  Visit Diagnosis:  Left shoulder pain  Weakness of shoulder  Right shoulder pain  Decreased ROM of left shoulder  Decreased ROM of right shoulder      Subjective Assessment - 12/22/14 1028    Subjective Cont with C/O proximal Left humerus pain through are of deltoid, worse with actiivty   Limitations Lifting;House hold activities   Pain Score 3    Pain Location Shoulder   Pain Orientation Left                         [facility] Adult PT Treatment/Exercise - 12/22/14 0001    Shoulder Exercises: Supine   Protraction Strengthening;Left;20 reps;Weights   Protraction Weight (lbs) 2#, 1#   Protraction Limitations press up x 20   External Rotation Strengthening;Both;20 reps   Theraband Level (Shoulder External Rotation) Level 1 (Yellow)   External Rotation Weight (lbs) 2#    External Rotation Limitations 2way (DB and TB)   Internal Rotation Strengthening;Left;20 reps;Theraband   Theraband Level (Shoulder Internal Rotation) Level 1 (Yellow)   Flexion Strengthening;Left;20 reps;Weights   Shoulder Flexion Weight (lbs) 1#   Other Supine Exercises chest press 2# x20   Other Supine Exercises 2# circles @ 90 x 20 each direction   Shoulder Exercises: Seated   Flexion 10  reps;Left;Weights   Flexion Weight (lbs) 1   Flexion Limitations 3 sets   Other Seated Exercises bicep curls 2# 3x10;    Other Seated Exercises Nustep x 8' L4   Shoulder Exercises: Standing   Other Standing Exercises wall ball circles   Other Standing Exercises Yell TB row unilat and bilat, IR x 20   Shoulder Exercises: ROM/Strengthening   UBE (Upper Arm Bike) L2 x 6 min; 3 min forward/3 min backward                  PT Short Term Goals - 11/28/14 1136    PT SHORT TERM GOAL #1   Title independent with initial HEP   Status Achieved           PT Long Term Goals - 12/15/14 1126    PT LONG TERM GOAL #1   Title pt. will be able to lift 1-5 lbs overhead with LUE without pain for ability to do ADL   Time 4   Period Weeks   Status New   PT LONG TERM GOAL #2   Status Achieved               Problem List Patient Active Problem List   Diagnosis Date Noted   Callus of foot 11/24/2012   Pain in joint, ankle and foot 09/15/2012  Tomie ChinaLarry C Clements, PTA 12/22/2014, 10:32 AM  [facility] [address] [place], [place], [address] Phone: [phone]   Fax:  [phone]

## 2014-12-26 ENCOUNTER — Ambulatory Visit: Payer: Medicare Other | Admitting: Physical Therapy

## 2014-12-26 ENCOUNTER — Encounter: Payer: Self-pay | Admitting: Physical Therapy

## 2014-12-26 DIAGNOSIS — M25512 Pain in left shoulder: Secondary | ICD-10-CM | POA: Diagnosis not present

## 2014-12-26 DIAGNOSIS — M25611 Stiffness of right shoulder, not elsewhere classified: Secondary | ICD-10-CM

## 2014-12-26 DIAGNOSIS — M25612 Stiffness of left shoulder, not elsewhere classified: Secondary | ICD-10-CM

## 2014-12-26 DIAGNOSIS — R29898 Other symptoms and signs involving the musculoskeletal system: Secondary | ICD-10-CM

## 2014-12-26 DIAGNOSIS — M25511 Pain in right shoulder: Secondary | ICD-10-CM

## 2014-12-26 NOTE — Therapy (Signed)
[place], [place], [address] Phone: [phone]   Fax:  [phone]  Physical Therapy Treatment  Patient Details  Name: Danielle Dean MRN: [id number] Date of Birth: 09/20/1928 Referring Provider:  Bo Merino, MD  Encounter Date: 12/26/2014      PT End of Session - 12/26/14 1453    Visit Number 18   Date for PT Re-Evaluation 01/25/15   PT Start Time 1100   PT Stop Time 1138   PT Time Calculation (min) 38 min   Activity Tolerance Patient tolerated treatment well   Behavior During Therapy [facility] for tasks assessed/performed      Past Medical History  Diagnosis Date  . Hypertension   . Corns and callosities   . Callus     History reviewed. No pertinent past surgical history.  There were no vitals filed for this visit.  Visit Diagnosis:  Left shoulder pain - Plan: PT plan of care cert/re-cert  Weakness of shoulder - Plan: PT plan of care cert/re-cert  Right shoulder pain - Plan: PT plan of care cert/re-cert  Decreased ROM of left shoulder - Plan: PT plan of care cert/re-cert  Decreased ROM of right shoulder - Plan: PT plan of care cert/re-cert      Subjective Assessment - 12/26/14 1101    Subjective Some pain in the left underatm area.  Reports doing better with dressing and doing hair   Currently in Pain? Yes   Pain Score 2    Pain Location Shoulder   Pain Orientation Left   Pain Descriptors / Indicators Aching   Pain Type Chronic pain   Aggravating Factors  reaching up   Pain Relieving Factors rest            [facility] PT Assessment - 12/26/14 0001    AROM   Right Shoulder Flexion 100 Degrees   Right Shoulder ABduction 100 Degrees   Right Shoulder External Rotation 50 Degrees   Left Shoulder Flexion 160 Degrees   Left Shoulder ABduction 145 Degrees   Left Shoulder Internal Rotation 60 Degrees   Left Shoulder External Rotation 80 Degrees                      [facility] Adult PT Treatment/Exercise - 12/26/14 0001    Shoulder Exercises: Supine   Protraction Limitations press up x 20   Theraband Level (Shoulder External Rotation) Level 1 (Yellow)   External Rotation Weight (lbs) 2#    Internal Rotation Strengthening;Left;20 reps;Theraband   Theraband Level (Shoulder Internal Rotation) Level 1 (Yellow)   Shoulder Flexion Weight (lbs) 1#   Shoulder ABduction Weight (lbs) 1#   Other Supine Exercises chest press 2# x20   Other Supine Exercises 2# circles @ 90 x 20 each direction   Shoulder Exercises: Seated   Horizontal ABduction PROM   Other Seated Exercises bicep curls 2# 3x10;    Other Seated Exercises Nustep x 8' L4   Shoulder Exercises: Standing   Extension Weight (lbs) 2#   Other Standing Exercises wall slides and circles with 1# and without weight   Other Standing Exercises Yell TB row unilat and bilat, IR x 20   Shoulder Exercises: ROM/Strengthening   "W" Arms 1#   Rhythmic Stabilization, Seated standing 2#   Other ROM/Strengthening Exercises Lat PD 10# x 20, seated row   Manual Therapy   Manual Therapy Passive ROM   Passive ROM PROM ER, flexion and horizontal adduction  PT Short Term Goals - 11/28/14 1136    PT SHORT TERM GOAL #1   Title independent with initial HEP   Status Achieved           PT Long Term Goals - 12/26/14 1455    PT LONG TERM GOAL #1   Title pt. will be able to lift 1-5 lbs overhead with LUE without pain for ability to do ADL   Status Partially Met   PT LONG TERM GOAL #2   Title increase AROM of the left shoulder to 130 degrees flexion   Status Achieved   PT LONG TERM GOAL #3   Title report 50% easier to dress   Status Achieved   PT LONG TERM GOAL #4   Title report no difficulty doing her hair   Status Achieved               Plan - 12/26/14 1454    Clinical Impression Statement Continues to have deficits of ROM and strength, she is  overall doing well with her function, has crepitus that is possibly due to decreased strength.   PT Next Visit Plan sent recertification today, will plan on d/C in the next period   Consulted and Agree with Plan of Care Patient        Problem List Patient Active Problem List   Diagnosis Date Noted  . Callus of foot 11/24/2012  . Pain in joint, ankle and foot 09/15/2012    Sumner Boast., PT 12/26/2014, 2:59 PM  [place] [address] [place], [place], [address] Phone: [phone]   Fax:  [phone]

## 2014-12-29 ENCOUNTER — Ambulatory Visit: Payer: Medicare Other | Admitting: Physical Therapy

## 2014-12-29 ENCOUNTER — Encounter: Payer: Self-pay | Admitting: Physical Therapy

## 2014-12-29 DIAGNOSIS — M25611 Stiffness of right shoulder, not elsewhere classified: Secondary | ICD-10-CM

## 2014-12-29 DIAGNOSIS — M25612 Stiffness of left shoulder, not elsewhere classified: Secondary | ICD-10-CM

## 2014-12-29 DIAGNOSIS — M25512 Pain in left shoulder: Secondary | ICD-10-CM

## 2014-12-29 DIAGNOSIS — R29898 Other symptoms and signs involving the musculoskeletal system: Secondary | ICD-10-CM

## 2014-12-29 DIAGNOSIS — M25511 Pain in right shoulder: Secondary | ICD-10-CM

## 2014-12-29 NOTE — Therapy (Signed)
[place], [place], [address] Phone: [phone]   Fax:  [phone]  Physical Therapy Treatment  Patient Details  Name: Danielle Dean MRN: [id number] Date of Birth: 1928-10-22 Referring Provider:  Bo Merino, MD  Encounter Date: 12/29/2014      PT End of Session - 12/29/14 1138    Visit Number 19   Date for PT Re-Evaluation 01/25/15   PT Start Time 1057   PT Stop Time 1143   PT Time Calculation (min) 46 min   Activity Tolerance Patient tolerated treatment well   Behavior During Therapy [facility] for tasks assessed/performed      Past Medical History  Diagnosis Date  . Hypertension   . Corns and callosities   . Callus     History reviewed. No pertinent past surgical history.  There were no vitals filed for this visit.  Visit Diagnosis:  Left shoulder pain  Weakness of shoulder  Right shoulder pain  Decreased ROM of left shoulder  Decreased ROM of right shoulder      Subjective Assessment - 12/29/14 1104    Subjective Reports some increased pain in the left shoulder underarm area since last visit, "I don't think that I did too much"   Currently in Pain? Yes   Pain Score 3    Pain Location Shoulder   Pain Orientation Left   Pain Descriptors / Indicators Aching;Tightness   Pain Type Chronic pain   Pain Onset More than a month ago                         [facility] Adult PT Treatment/Exercise - 12/29/14 0001    Shoulder Exercises: Supine   Protraction Weight (lbs) 2#, 1#   Protraction Limitations press up x 20   Horizontal ABduction Both;20 reps;Theraband   Theraband Level (Shoulder Horizontal ABduction) Level 2 (Red)   Theraband Level (Shoulder External Rotation) Level 1 (Yellow)   External Rotation Weight (lbs) 2#    Shoulder Flexion Weight (lbs) 1#   Flexion Limitations x20   Other Supine Exercises chest press 2# x20   Other Supine Exercises 2#  circles @ 90 x 20 each direction   Shoulder Exercises: Seated   Other Seated Exercises upright row and extension with stick 2#, supine isometrics   Other Seated Exercises Nustep x 8' L4   Shoulder Exercises: Standing   Other Standing Exercises wall slides and circles with 1# and without weight   Other Standing Exercises Yell TB row unilat and bilat, IR x 20   Shoulder Exercises: ROM/Strengthening   "W" Arms 1#   Ball on Wall 20   Other ROM/Strengthening Exercises Lat PD 10# x 20, seated row   Manual Therapy   Manual Therapy Passive ROM;Myofascial release   Myofascial Release to the left lat, teres and triceps   Passive ROM PROM ER, flexion and horizontal adduction                  PT Short Term Goals - 11/28/14 1136    PT SHORT TERM GOAL #1   Title independent with initial HEP   Status Achieved           PT Long Term Goals - 12/26/14 1455    PT LONG TERM GOAL #1   Title pt. will be able to lift 1-5 lbs overhead with LUE without pain for ability to do ADL   Status Partially Met  PT LONG TERM GOAL #2   Title increase AROM of the left shoulder to 130 degrees flexion   Status Achieved   PT LONG TERM GOAL #3   Title report 50% easier to dress   Status Achieved   PT LONG TERM GOAL #4   Title report no difficulty doing her hair   Status Achieved               Plan - 12/29/14 1138    Clinical Impression Statement Has tightness and tenderness in the triceps, she is able to do more per her report at home, she is still having pain and some difficulty with ADL's, weakness above 90 degrees is noted   PT Next Visit Plan see if manual therapy helped the pain   Consulted and Agree with Plan of Care Patient        Problem List Patient Active Problem List   Diagnosis Date Noted  . Callus of foot 11/24/2012  . Pain in joint, ankle and foot 09/15/2012    Sumner Boast., PT 12/29/2014, 11:41 AM  [place], [place], [address] Phone: [phone]   Fax:  [phone]

## 2015-01-02 ENCOUNTER — Ambulatory Visit: Payer: Medicare Other | Admitting: Physical Therapy

## 2015-01-02 ENCOUNTER — Encounter: Payer: Self-pay | Admitting: Physical Therapy

## 2015-01-02 DIAGNOSIS — M25612 Stiffness of left shoulder, not elsewhere classified: Secondary | ICD-10-CM

## 2015-01-02 DIAGNOSIS — M25512 Pain in left shoulder: Secondary | ICD-10-CM

## 2015-01-02 DIAGNOSIS — M25611 Stiffness of right shoulder, not elsewhere classified: Secondary | ICD-10-CM

## 2015-01-02 DIAGNOSIS — R29898 Other symptoms and signs involving the musculoskeletal system: Secondary | ICD-10-CM

## 2015-01-02 DIAGNOSIS — M25511 Pain in right shoulder: Secondary | ICD-10-CM

## 2015-01-02 NOTE — Therapy (Signed)
[place], [place], [address] Phone: [phone]   Fax:  [phone]  Physical Therapy Treatment  Patient Details  Name: Danielle Dean MRN: [id number] Date of Birth: Dec 15, 1928 Referring Provider:  Bo Merino, MD  Encounter Date: 01/02/2015      PT End of Session - 01/02/15 1051    Visit Number 20   Date for PT Re-Evaluation 01/25/15   PT Start Time 6720   PT Stop Time 1054   PT Time Calculation (min) 39 min      Past Medical History  Diagnosis Date  . Hypertension   . Corns and callosities   . Callus     History reviewed. No pertinent past surgical history.  There were no vitals filed for this visit.  Visit Diagnosis:  Decreased ROM of right shoulder  Decreased ROM of left shoulder  Right shoulder pain  Weakness of shoulder  Left shoulder pain      Subjective Assessment - 01/02/15 1015    Subjective Pt reports that her L shoulder was doing fine between PT treatments but now it is hurting    Limitations Lifting;House hold activities   Patient Stated Goals no pain, easier to dress and do hair                         [facility] Adult PT Treatment/Exercise - 01/02/15 0001    Shoulder Exercises: Supine   Flexion Strengthening;Left;20 reps;Weights   Shoulder Flexion Weight (lbs) #2   Flexion Limitations x20   Other Supine Exercises chest press 2# x20, standing bicep curls x20   Other Supine Exercises Scap repractions yellow Tband 20, D2 flexion X20 yellow Tband    Shoulder Exercises: Seated   Other Seated Exercises Nustep x 8' L4   Shoulder Exercises: Standing   Other Standing Exercises wall slides and circles, wall pushups with red physo ball   Other Standing Exercises Yell TB row bi lat, IR & ER x 20                  PT Short Term Goals - 11/28/14 1136    PT SHORT TERM GOAL #1   Title independent with initial HEP   Status Achieved            PT Long Term Goals - 12/26/14 1455    PT LONG TERM GOAL #1   Title pt. will be able to lift 1-5 lbs overhead with LUE without pain for ability to do ADL   Status Partially Met   PT LONG TERM GOAL #2   Title increase AROM of the left shoulder to 130 degrees flexion   Status Achieved   PT LONG TERM GOAL #3   Title report 50% easier to dress   Status Achieved   PT LONG TERM GOAL #4   Title report no difficulty doing her hair   Status Achieved               Plan - 01/02/15 1052    Clinical Impression Statement Pt able to complete all exercises but has subjective c/o pain in triceps area    Pt will benefit from skilled therapeutic intervention in order to improve on the following deficits Decreased range of motion;Decreased strength;Increased muscle spasms;Pain   Rehab Potential Good   PT Frequency 2x / week   PT Treatment/Interventions Cryotherapy;Ultrasound;Moist Heat;Electrical Stimulation;Therapeutic exercise;Manual techniques;Patient/family education;Passive range of motion   PT  Next Visit Plan discontinue PT         Problem List Patient Active Problem List   Diagnosis Date Noted  . Callus of foot 11/24/2012  . Pain in joint, ankle and foot 09/15/2012    Scot Jun, PTA 01/02/2015, 10:56 AM  [place], [place], [address] Phone: [phone]   Fax:  [phone]

## 2015-02-21 ENCOUNTER — Encounter: Payer: Self-pay | Admitting: Podiatry

## 2015-02-21 ENCOUNTER — Ambulatory Visit (INDEPENDENT_AMBULATORY_CARE_PROVIDER_SITE_OTHER): Payer: Medicare Other | Admitting: Podiatry

## 2015-02-21 VITALS — BP 159/76 | HR 68 | Resp 16

## 2015-02-21 DIAGNOSIS — L84 Corns and callosities: Secondary | ICD-10-CM

## 2015-02-22 NOTE — Progress Notes (Signed)
Subjective:     Patient ID: Danielle Dean, female   DOB: 07-14-1928, 81 y.o.   MRN: [id number]  HPI patient presents with painful calluses on both feet   Review of Systems     Objective:   Physical Exam Neurovascular status unchanged with keratotic lesion fifth digit bilateral    Assessment:     Corn callus formation bilateral    Plan:     Debride lesion bilateral with no iatrogenic bleeding noted

## 2015-02-28 ENCOUNTER — Ambulatory Visit: Payer: Medicare Other | Attending: Rheumatology | Admitting: Physical Therapy

## 2015-02-28 ENCOUNTER — Encounter: Payer: Self-pay | Admitting: Physical Therapy

## 2015-02-28 DIAGNOSIS — M7582 Other shoulder lesions, left shoulder: Secondary | ICD-10-CM | POA: Diagnosis not present

## 2015-02-28 DIAGNOSIS — M25511 Pain in right shoulder: Secondary | ICD-10-CM | POA: Diagnosis present

## 2015-02-28 DIAGNOSIS — M25611 Stiffness of right shoulder, not elsewhere classified: Secondary | ICD-10-CM

## 2015-02-28 DIAGNOSIS — R29898 Other symptoms and signs involving the musculoskeletal system: Secondary | ICD-10-CM | POA: Diagnosis present

## 2015-02-28 DIAGNOSIS — M25612 Stiffness of left shoulder, not elsewhere classified: Secondary | ICD-10-CM

## 2015-02-28 DIAGNOSIS — M25512 Pain in left shoulder: Secondary | ICD-10-CM | POA: Diagnosis present

## 2015-02-28 NOTE — Therapy (Signed)
[facility] [address] [place], [name], [address] Phone: [phone]   Fax:  [phone]  Physical Therapy Evaluation  Patient Details  Name: Danielle Dean MRN: [id number] Date of Birth: [birth year] Referring Provider:  Pollyann Savoy, MD  Encounter Date: 02/28/2015      PT End of Session - 02/28/15 1134    Visit Number 1   Date for PT Re-Evaluation 04/30/15   PT Start Time 1054   PT Stop Time 1150   PT Time Calculation (min) 56 min   Activity Tolerance Patient limited by pain   Behavior During Therapy [facility] for tasks assessed/performed      Past Medical History  Diagnosis Date  . Hypertension   . Corns and callosities   . Callus     History reviewed. No pertinent past surgical history.  There were no vitals filed for this visit.  Visit Diagnosis:  Decreased ROM of left shoulder - Plan: PT plan of care cert/re-cert  Left shoulder pain - Plan: PT plan of care cert/re-cert  Decreased ROM of right shoulder - Plan: PT plan of care cert/re-cert  Right shoulder pain - Plan: PT plan of care cert/re-cert  Weakness of shoulder - Plan: PT plan of care cert/re-cert      Subjective Assessment - 02/28/15 1054    Subjective Patient has been seen here previously for a right TSR in 2014, she was seen here earlier int he year for shoulder pain, she reports that the left shouler now is hurting the most, she has not had surgery on this left side, reports pain with reaching out, across and back   Limitations Lifting;House hold activities   Patient Stated Goals less pain and easier motions to reach for toilet paper, some difficulty with dressing and doing her hair   Currently in Pain? Yes   Pain Score 6    Pain Location Shoulder   Pain Orientation Left   Pain Descriptors / Indicators Aching;Tightness   Pain Type Chronic pain   Pain Onset More than a month ago   Pain Frequency Constant   Aggravating Factors   reaching out and up, worst is reaching behind her for toilet paper, pain up to 8/10   Pain Relieving Factors rest   Effect of Pain on Daily Activities difficulty with ADL's            [facility] PT Assessment - 02/28/15 0001    Assessment   Medical Diagnosis left shoulder pain   Onset Date/Surgical Date 02/14/15   Hand Dominance Right   Prior Therapy yes for shoulder pain and TSR   Precautions   Precautions None   Balance Screen   Has the patient fallen in the past 6 months No   Has the patient had a decrease in activity level because of a fear of falling?  No   Is the patient reluctant to leave their home because of a fear of falling?  No   Home Environment   Additional Comments lives with family, does some housework   Prior Function   Level of Independence Independent   Leisure walks some   Posture/Postural Control   Posture Comments fwd head, rounded shoulders   AROM   Right Shoulder Flexion 75 Degrees   Right Shoulder ABduction 70 Degrees   Right Shoulder Internal Rotation 40 Degrees   Right Shoulder External Rotation 40 Degrees   Left Shoulder Flexion 110 Degrees   Left Shoulder ABduction 75 Degrees  Left Shoulder Internal Rotation 25 Degrees   Left Shoulder External Rotation 70 Degrees   Strength   Overall Strength Comments right shoulder 3+/5 in the available ROM, left shoulder 3+/5 in the available ROM with pain for all resisted tests on the left shoulder   Palpation   Palpation comment very tight in the left upper trap, sore left lateral shoulder, she is very gaurded to motion, difficulty relaxing, requires cues to relax and allow passive motions   Special Tests   Rotator Cuff Impingment tests Leanord Asal test;Neer impingement test   Neer Impingement test    Findings Positive   Side Left                   [facility] Adult PT Treatment/Exercise - 02/28/15 0001    Moist Heat Therapy   Number Minutes Moist Heat 15 Minutes   Moist Heat Location Shoulder    Electrical Stimulation   Electrical Stimulation Location left shoulder   Electrical Stimulation Action IFC   Electrical Stimulation Parameters tolerance pain   Manual Therapy   Manual Therapy Passive ROM;Myofascial release   Soft tissue mobilization left shoulder and upper arm   Myofascial Release to the left lat, teres and triceps   Passive ROM PROM ER, flexion and horizontal adduction                  PT Short Term Goals - 02/28/15 1149    PT SHORT TERM GOAL #1   Title independent with initial HEP   Time 2   Period Weeks   Status New           PT Long Term Goals - 02/28/15 1149    PT LONG TERM GOAL #1   Title pt. will be able to lift 1-5 lbs overhead with LUE without pain for ability to do ADL   Time 8   Period Weeks   Status New   PT LONG TERM GOAL #2   Title increase AROM of the left shoulder to 130 degrees flexion   Time 8   Period Weeks   Status New   PT LONG TERM GOAL #3   Title report 50% easier to dress   Time 8   Period Weeks   Status New   PT LONG TERM GOAL #4   Title report no difficulty doing her hair and reach toilet paper   Time 8   Period Weeks   Status New   PT LONG TERM GOAL #5   Title increase AROM of left shoulder IR to 55 degrees   Time 8   Period Weeks   Status New               Plan - 02/28/15 1139    Clinical Impression Statement Patient has been seen here previously for shoulder pain, had a right reverse TSR in 2014, the left shoulder is her painful side, she reports recently she is having more difficulty dressing and getting toilket paper.  She was seen here earlier this year and we got good ROM back and she was able to come in everyother week and was able to do well without less pain so she was discharged.  She returns now with increased pain and decreased ROM   Pt will benefit from skilled therapeutic intervention in order to improve on the following deficits Decreased range of motion;Decreased strength;Increased  muscle spasms;Pain   Rehab Potential Good   PT Frequency 2x / week   PT Duration 8  weeks   PT Treatment/Interventions Cryotherapy;Ultrasound;Moist Heat;Electrical Stimulation;Therapeutic exercise;Manual techniques;Patient/family education;Passive range of motion   PT Next Visit Plan add exercises, PROM   Consulted and Agree with Plan of Care Patient          G-Codes - 03/20/15 1151    Functional Assessment Tool Used FOTO   Functional Limitation Other PT primary   Other PT Primary Current Status (Z6109) At least 60 percent but less than 80 percent impaired, limited or restricted   Other PT Primary Goal Status (U0454) At least 40 percent but less than 60 percent impaired, limited or restricted       Problem List Patient Active Problem List   Diagnosis Date Noted  . Callus of foot 11/24/2012  . Pain in joint, ankle and foot 09/15/2012    Jearld Lesch., PT 20-Mar-2015, 11:54 AM  [facility] [place] [address] [place], [name], [address] Phone: [phone]   Fax:  [phone]

## 2015-03-02 ENCOUNTER — Ambulatory Visit: Payer: Medicare Other | Admitting: Physical Therapy

## 2015-03-05 ENCOUNTER — Encounter: Payer: Self-pay | Admitting: Physical Therapy

## 2015-03-05 ENCOUNTER — Ambulatory Visit: Payer: Medicare Other | Admitting: Physical Therapy

## 2015-03-05 DIAGNOSIS — M25612 Stiffness of left shoulder, not elsewhere classified: Secondary | ICD-10-CM

## 2015-03-05 DIAGNOSIS — M7582 Other shoulder lesions, left shoulder: Secondary | ICD-10-CM | POA: Diagnosis not present

## 2015-03-05 DIAGNOSIS — M25512 Pain in left shoulder: Secondary | ICD-10-CM

## 2015-03-05 NOTE — Therapy (Signed)
[place], [place], [address] Phone: [phone]   Fax:  [phone]  Physical Therapy Treatment  Patient Details  Name: Danielle Dean MRN: [id number] Date of Birth: 14-Feb-1929 Referring Corine Solorio:  Bo Merino, MD  Encounter Date: 03/05/2015      PT End of Session - 03/05/15 1345    Visit Number 2   Date for PT Re-Evaluation 04/30/15   PT Start Time 1300   PT Stop Time 1345   PT Time Calculation (min) 45 min   Activity Tolerance Patient limited by pain   Behavior During Therapy [facility] for tasks assessed/performed      Past Medical History  Diagnosis Date  . Hypertension   . Corns and callosities   . Callus     History reviewed. No pertinent past surgical history.  There were no vitals filed for this visit.  Visit Diagnosis:  Left shoulder pain  Decreased ROM of left shoulder      Subjective Assessment - 03/05/15 1301    Subjective Pt reports no changes since Eval.    Patient Stated Goals less pain and easier motions to reach for toilet paper, some difficulty with dressing and doing her hair   Currently in Pain? Yes   Pain Score 5    Pain Location Shoulder   Pain Orientation Left   Pain Descriptors / Indicators Aching;Tightness   Pain Type Chronic pain   Pain Onset More than a month ago                         [facility] Adult PT Treatment/Exercise - 03/05/15 0001    Knee/Hip Exercises: Aerobic   Nustep L3 X4   Shoulder Exercises: Supine   Flexion 10 reps;AROM   ABduction 10 reps;AROM   Other Supine Exercises serratus punches #2 3X10   Shoulder Exercises: Standing   External Rotation Theraband;10 reps  3 sets   Theraband Level (Shoulder External Rotation) Level 1 (Yellow)   Internal Rotation 10 reps   Theraband Level (Shoulder Internal Rotation) Level 1 (Yellow)   Other Standing Exercises Standing biceps curls #2 2x10, #1 x10   Shoulder Exercises:  ROM/Strengthening   UBE (Upper Arm Bike) L1 x 4 min; 2 min forward/2 min backward   Shoulder Exercises: Stretch   Other Shoulder Stretches Latter flexion x10   Shoulder Exercises: Power [employer] 10 reps  2 sets    Row Limitations #5   Other Power Tower Exercises Lat pull downs #10 2X10,  #15x10   Manual Therapy   Manual Therapy Passive ROM   Soft tissue mobilization left shoulder and upper arm   Passive ROM PROM ER, flexion and horizontal adduction                  PT Short Term Goals - 03/05/15 1347    PT SHORT TERM GOAL #1   Title independent with initial HEP   Status Partially Met           PT Long Term Goals - 02/28/15 1149    PT LONG TERM GOAL #1   Title pt. will be able to lift 1-5 lbs overhead with LUE without pain for ability to do ADL   Time 8   Period Weeks   Status New   PT LONG TERM GOAL #2   Title increase AROM of the left shoulder to 130 degrees flexion   Time 8  Period Weeks   Status New   PT LONG TERM GOAL #3   Title report 50% easier to dress   Time 8   Period Weeks   Status New   PT LONG TERM GOAL #4   Title report no difficulty doing her hair and reach toilet paper   Time 8   Period Weeks   Status New   PT LONG TERM GOAL #5   Title increase AROM of left shoulder IR to 55 degrees   Time 8   Period Weeks   Status New               Plan - 03/05/15 1345    Clinical Impression Statement Pt does have some increase pain during exercises, Constant cues provided with ER of bilat shoulders. Pt does report increase pain with R shoulder IR. Pt demo ed decrease R shoulder abduction ROM that improved after manual therapy.    Pt will benefit from skilled therapeutic intervention in order to improve on the following deficits Decreased range of motion;Decreased strength;Increased muscle spasms;Pain   PT Frequency 2x / week   PT Duration 8 weeks   PT Treatment/Interventions Cryotherapy;Ultrasound;Moist Heat;Electrical  Stimulation;Therapeutic exercise;Manual techniques;Patient/family education;Passive range of motion   PT Next Visit Plan add exercises, PROM        Problem List Patient Active Problem List   Diagnosis Date Noted  . Callus of foot 11/24/2012  . Pain in joint, ankle and foot 09/15/2012    Scot Jun , PTA  [place], [place], [address] Phone: [phone]   Fax:  [phone]

## 2015-03-09 ENCOUNTER — Ambulatory Visit: Payer: Medicare Other | Admitting: Physical Therapy

## 2015-03-09 DIAGNOSIS — M25611 Stiffness of right shoulder, not elsewhere classified: Secondary | ICD-10-CM

## 2015-03-09 DIAGNOSIS — M25511 Pain in right shoulder: Secondary | ICD-10-CM

## 2015-03-09 DIAGNOSIS — M25612 Stiffness of left shoulder, not elsewhere classified: Secondary | ICD-10-CM

## 2015-03-09 DIAGNOSIS — M25512 Pain in left shoulder: Secondary | ICD-10-CM

## 2015-03-09 DIAGNOSIS — R29898 Other symptoms and signs involving the musculoskeletal system: Secondary | ICD-10-CM

## 2015-03-09 DIAGNOSIS — M7582 Other shoulder lesions, left shoulder: Secondary | ICD-10-CM | POA: Diagnosis not present

## 2015-03-09 NOTE — Therapy (Signed)
[place] [facility] °[address] °[place], [place], [address] °Phone: [phone]   Fax:  [phone] ° °Physical Therapy Treatment ° °Patient Details  °Name: Danielle Dean °MRN: [id number] °Date of Birth: 05/07/1929 °Referring Provider:  Deveshwar, Shaili, MD ° °Encounter Date: 03/09/2015 ° °  °  °PT End of Session - 03/09/15 1012   ° Visit Number 3  ° Date for PT Re-Evaluation 04/30/15  ° PT Start Time 0933  ° PT Stop Time 1012  ° PT Time Calculation (min) 39 min  °  ° ° °Past Medical History  °Diagnosis Date  °• Hypertension   °• Corns and callosities   °• Callus   ° ° °No past surgical history on file. ° °There were no vitals filed for this visit. ° °Visit Diagnosis:  Left shoulder pain ° °Decreased ROM of left shoulder ° °Decreased ROM of right shoulder ° °Right shoulder pain ° °Weakness of shoulder ° °  °  °Subjective Assessment - 03/09/15 1010   ° Subjective Cont with Left UE soreness   ° Pain Score 4   ° Pain Location Shoulder  ° Pain Orientation Left  °  ° ° ° ° ° ° ° ° ° ° ° ° ° ° ° ° ° °  °  °[facility] Adult PT Treatment/Exercise - 03/09/15 0001   ° Exercises  ° Exercises Neck  ° Neck Exercises: Seated  ° Other Seated Exercise lateral flexion stretch 3x30" ea B  ° Lumbar Exercises: Aerobic  ° UBE (Upper Arm Bike) L2 x8  ° Knee/Hip Exercises: Aerobic  ° Nustep L3 X4  ° Shoulder Exercises: Standing  ° External Rotation Strengthening;Both;20 reps;Theraband  ° Theraband Level (Shoulder External Rotation) Level 2 (Red)  ° Internal Rotation Strengthening;Both;20 reps;Theraband  ° Theraband Level (Shoulder Internal Rotation) Level 2 (Red)  ° Flexion AROM;Both;5 reps;Limitations  ° Shoulder Flexion Weight (lbs) short lever with step  ° Flexion Limitations decreased ROM right vs Left  ° Extension Strengthening;Both;20 reps;Theraband  ° Theraband Level (Shoulder Extension) Level 2 (Red)  ° Row Strengthening;Both;20 reps;Theraband  ° Theraband Level (Shoulder Row)  Level 2 (Red)  °  ° ° ° ° ° ° ° ° ° ° °  °  °PT Short Term Goals - 03/05/15 1347   ° PT SHORT TERM GOAL #1  ° Title independent with initial HEP  ° Status Partially Met  °  ° ° ° °  °  °PT Long Term Goals - 02/28/15 1149   ° PT LONG TERM GOAL #1  ° Title pt. will be able to lift 1-5 lbs overhead with LUE without pain for ability to do ADL  ° Time 8  ° Period Weeks  ° Status New  ° PT LONG TERM GOAL #2  ° Title increase AROM of the left shoulder to 130 degrees flexion  ° Time 8  ° Period Weeks  ° Status New  ° PT LONG TERM GOAL #3  ° Title report 50% easier to dress  ° Time 8  ° Period Weeks  ° Status New  ° PT LONG TERM GOAL #4  ° Title report no difficulty doing her hair and reach toilet paper  ° Time 8  ° Period Weeks  ° Status New  ° PT LONG TERM GOAL #5  ° Title increase AROM of left shoulder IR to 55 degrees  ° Time 8  ° Period Weeks  ° Status New  °  ° ° ° ° ° ° ° °  °  °  Plan - 03/09/15 1012   ° Clinical Impression Statement Demos good AROM flexion overhead Left.  C/O soreness upper lateral Left UE.  Decreased upper trap flexibility bilaterally  ° PT Next Visit Plan Progress strength and flexibility  °  ° ° ° ° °Problem List °Patient Active Problem List  ° Diagnosis Date Noted  °• Callus of foot 11/24/2012  °• Pain in joint, ankle and foot 09/15/2012  ° ° °Larry C Clements, PTA °03/09/2015, 10:15 AM ° °[place] °[facility] °[address] °[place], [place], [address] °Phone: [phone]   Fax:  [phone] ° ° ° ° ° °

## 2015-03-14 ENCOUNTER — Ambulatory Visit: Payer: Medicare Other | Admitting: Physical Therapy

## 2015-03-16 ENCOUNTER — Ambulatory Visit: Payer: Medicare Other | Admitting: Physical Therapy

## 2015-03-16 DIAGNOSIS — M7582 Other shoulder lesions, left shoulder: Secondary | ICD-10-CM | POA: Diagnosis not present

## 2015-03-16 DIAGNOSIS — M25612 Stiffness of left shoulder, not elsewhere classified: Secondary | ICD-10-CM

## 2015-03-16 DIAGNOSIS — M25512 Pain in left shoulder: Secondary | ICD-10-CM

## 2015-03-16 DIAGNOSIS — R29898 Other symptoms and signs involving the musculoskeletal system: Secondary | ICD-10-CM

## 2015-03-16 DIAGNOSIS — M25511 Pain in right shoulder: Secondary | ICD-10-CM

## 2015-03-16 DIAGNOSIS — M25611 Stiffness of right shoulder, not elsewhere classified: Secondary | ICD-10-CM

## 2015-03-16 NOTE — Therapy (Signed)
[place], [place], [address] Phone: [phone]   Fax:  [phone]  Physical Therapy Treatment  Patient Details  Name: Danielle Dean MRN: [id number] Date of Birth: 1928/12/13 Referring Provider:  Bo Merino, MD  Encounter Date: 03/16/2015      PT End of Session - 03/16/15 1152    Visit Number 4   Date for PT Re-Evaluation 04/30/15   PT Start Time 1100   PT Stop Time 1145   PT Time Calculation (min) 45 min   Behavior During Therapy [facility] for tasks assessed/performed      Past Medical History  Diagnosis Date   Hypertension    Corns and callosities    Callus     No past surgical history on file.  There were no vitals filed for this visit.  Visit Diagnosis:  Left shoulder pain  Decreased ROM of left shoulder  Decreased ROM of right shoulder  Right shoulder pain  Weakness of shoulder      Subjective Assessment - 03/16/15 1151    Subjective left shoulder has been sore and achy this week   Pain Location Shoulder   Pain Orientation Left                         [facility] Adult PT Treatment/Exercise - 03/16/15 0001    Neck Exercises: Seated   Other Seated Exercise lateral flexion stretch 3x30" ea B   Lumbar Exercises: Aerobic   UBE (Upper Arm Bike) L2 x8   Knee/Hip Exercises: Aerobic   Nustep L3 X4   Shoulder Exercises: Standing   External Rotation Strengthening;Both;20 reps;Theraband   Theraband Level (Shoulder External Rotation) Level 2 (Red)   Internal Rotation Strengthening;Both;20 reps;Theraband   Theraband Level (Shoulder Internal Rotation) Level 2 (Red)   Flexion AROM;Both;5 reps;Limitations   Shoulder Flexion Weight (lbs) short lever with step   Flexion Limitations decreased ROM right vs Left   Extension Strengthening;Both;20 reps;Theraband   Theraband Level (Shoulder Extension) Level 2 (Red)   Row Strengthening;Both;20 reps;Theraband   Theraband Level (Shoulder Row) Level 2 (Red)   Other Standing Exercises Standing biceps curls #2 2x10, #1 x10   Shoulder Exercises: Power [employer] 20 reps   Row Limitations 10#   Other Power Engineer, water Lat pull downs #10 2X10,  #15x10   Modalities   Modalities Cryotherapy   Moist Heat Therapy   Number Minutes Moist Heat 15 Minutes   Moist Heat Location Shoulder                  PT Short Term Goals - 03/05/15 1347    PT SHORT TERM GOAL #1   Title independent with initial HEP   Status Partially Met           PT Long Term Goals - 02/28/15 1149    PT LONG TERM GOAL #1   Title pt. will be able to lift 1-5 lbs overhead with LUE without pain for ability to do ADL   Time 8   Period Weeks   Status New   PT LONG TERM GOAL #2   Title increase AROM of the left shoulder to 130 degrees flexion   Time 8   Period Weeks   Status New   PT LONG TERM GOAL #3   Title report 50% easier to dress   Time 8   Period Weeks   Status New   PT  LONG TERM GOAL #4   Title report no difficulty doing her hair and reach toilet paper   Time 8   Period Weeks   Status New   PT LONG TERM GOAL #5   Title increase AROM of left shoulder IR to 55 degrees   Time 8   Period Weeks   Status New               Plan - 03/16/15 1153    Clinical Impression Statement Left AROM> Right.  Increased pain with Left shoulder AROM flexion despite increase in ROM.  Did well with PREs        Problem List Patient Active Problem List   Diagnosis Date Noted   Callus of foot 11/24/2012   Pain in joint, ankle and foot 09/15/2012    Olean Ree, PTA 03/16/2015, 11:54 AM  [place], [place], [address] Phone: [phone]   Fax:  [phone]

## 2015-03-20 ENCOUNTER — Ambulatory Visit: Payer: Medicare Other | Attending: Rheumatology | Admitting: Physical Therapy

## 2015-03-20 ENCOUNTER — Encounter: Payer: Self-pay | Admitting: Physical Therapy

## 2015-03-20 DIAGNOSIS — M7582 Other shoulder lesions, left shoulder: Secondary | ICD-10-CM | POA: Insufficient documentation

## 2015-03-20 DIAGNOSIS — M25511 Pain in right shoulder: Secondary | ICD-10-CM | POA: Insufficient documentation

## 2015-03-20 DIAGNOSIS — M25612 Stiffness of left shoulder, not elsewhere classified: Secondary | ICD-10-CM

## 2015-03-20 DIAGNOSIS — M25512 Pain in left shoulder: Secondary | ICD-10-CM

## 2015-03-20 DIAGNOSIS — R29898 Other symptoms and signs involving the musculoskeletal system: Secondary | ICD-10-CM | POA: Insufficient documentation

## 2015-03-20 NOTE — Therapy (Signed)
[place], [place], [address] Phone: [phone]   Fax:  [phone]  Physical Therapy Treatment  Patient Details  Name: Danielle Dean MRN: [id number] Date of Birth: July 07, 1928 Referring Provider:  Bo Merino, MD  Encounter Date: 03/20/2015      PT End of Session - 03/20/15 1225    Visit Number 5   Date for PT Re-Evaluation 04/30/15   PT Start Time 1146   PT Stop Time 1234   PT Time Calculation (min) 48 min   Activity Tolerance Patient limited by pain   Behavior During Therapy [facility] for tasks assessed/performed      Past Medical History  Diagnosis Date  . Hypertension   . Corns and callosities   . Callus     History reviewed. No pertinent past surgical history.  There were no vitals filed for this visit.  Visit Diagnosis:  Decreased ROM of left shoulder  Left shoulder pain      Subjective Assessment - 03/20/15 1147    Subjective L shoulder getting a little better. Some pain with movement   Patient Stated Goals less pain and easier motions to reach for toilet paper, some difficulty with dressing and doing her hair   Currently in Pain? Yes   Pain Score 4    Pain Orientation Left   Pain Descriptors / Indicators Aching;Tightness                         [facility] Adult PT Treatment/Exercise - 03/20/15 0001    Lumbar Exercises: Aerobic   UBE (Upper Arm Bike) L3 x8   Knee/Hip Exercises: Aerobic   Nustep L2 73fd/4bak   Shoulder Exercises: Standing   External Rotation Strengthening;Both;20 reps;Theraband   Theraband Level (Shoulder External Rotation) Level 2 (Red);Level 3 (Green)   Internal Rotation Strengthening;Both;20 reps;Theraband   Theraband Level (Shoulder Internal Rotation) Level 3 (Green)   Flexion AROM;Limitations;20 reps;Both   Extension Strengthening;Both;20 reps;Theraband   Theraband Level (Shoulder Extension) Level 2 (Red)   Row  Strengthening;Both;20 reps;Theraband   Theraband Level (Shoulder Row) Level 3 (Green)   Other Standing Exercises Standing biceps curls #2 2x10, #1 x10   Other Standing Exercises Wall slides flexion    Shoulder Exercises: Power [employer]20 reps   Row Limitations #   Other Power TUnumProvidentExercises Lat pull downs #10 x20   Modalities   Modalities Cryotherapy   Moist Heat Therapy   Number Minutes Moist Heat 10 Minutes   Moist Heat Location Shoulder                  PT Short Term Goals - 03/05/15 1347    PT SHORT TERM GOAL #1   Title independent with initial HEP   Status Partially Met           PT Long Term Goals - 02/28/15 1149    PT LONG TERM GOAL #1   Title pt. will be able to lift 1-5 lbs overhead with LUE without pain for ability to do ADL   Time 8   Period Weeks   Status New   PT LONG TERM GOAL #2   Title increase AROM of the left shoulder to 130 degrees flexion   Time 8   Period Weeks   Status New   PT LONG TERM GOAL #3   Title report 50% easier to dress   Time 8   Period  Weeks   Status New   PT LONG TERM GOAL #4   Title report no difficulty doing her hair and reach toilet paper   Time 8   Period Weeks   Status New   PT LONG TERM GOAL #5   Title increase AROM of left shoulder IR to 55 degrees   Time 8   Period Weeks   Status New               Plan - 03/20/15 1226    Clinical Impression Statement Continues to have increase L shoulder pain with exercises, althought shoulder feels better when she isnt active. Does well with PRE's. Pt requested to spend additional time on NuStep and UBE.   Pt will benefit from skilled therapeutic intervention in order to improve on the following deficits Decreased range of motion;Decreased strength;Increased muscle spasms;Pain   Rehab Potential Good   PT Frequency 2x / week   PT Duration 8 weeks   PT Treatment/Interventions Cryotherapy;Ultrasound;Moist Heat;Electrical Stimulation;Therapeutic exercise;Manual  techniques;Patient/family education;Passive range of motion   PT Next Visit Plan Progress strength and flexibility        Problem List Patient Active Problem List   Diagnosis Date Noted  . Callus of foot 11/24/2012  . Pain in joint, ankle and foot 09/15/2012    Scot Jun, PTA  03/20/2015, 12:28 PM  [place], [place], [address] Phone: [phone]   Fax:  [phone]

## 2015-03-26 ENCOUNTER — Encounter: Payer: Self-pay | Admitting: Physical Therapy

## 2015-03-26 ENCOUNTER — Ambulatory Visit: Payer: Medicare Other | Admitting: Physical Therapy

## 2015-03-26 DIAGNOSIS — M25612 Stiffness of left shoulder, not elsewhere classified: Secondary | ICD-10-CM

## 2015-03-26 DIAGNOSIS — M25511 Pain in right shoulder: Secondary | ICD-10-CM

## 2015-03-26 DIAGNOSIS — R29898 Other symptoms and signs involving the musculoskeletal system: Secondary | ICD-10-CM

## 2015-03-26 DIAGNOSIS — M25512 Pain in left shoulder: Secondary | ICD-10-CM

## 2015-03-26 DIAGNOSIS — M25611 Stiffness of right shoulder, not elsewhere classified: Secondary | ICD-10-CM

## 2015-03-26 DIAGNOSIS — M7582 Other shoulder lesions, left shoulder: Secondary | ICD-10-CM | POA: Diagnosis not present

## 2015-03-26 NOTE — Therapy (Signed)
[place], [place], [address] Phone: [phone]   Fax:  [phone]  Physical Therapy Treatment  Patient Details  Name: Danielle Dean MRN: [id number] Date of Birth: 18-Nov-1928 Referring Provider:  Bo Merino, MD  Encounter Date: 03/26/2015      PT End of Session - 03/26/15 1213    Visit Number 6   Date for PT Re-Evaluation 04/30/15   PT Start Time 1124   PT Stop Time 1230   PT Time Calculation (min) 66 min   Activity Tolerance Patient limited by pain   Behavior During Therapy [facility]  for tasks assessed/performed      Past Medical History  Diagnosis Date  . Hypertension   . Corns and callosities   . Callus     History reviewed. No pertinent past surgical history.  There were no vitals filed for this visit.  Visit Diagnosis:  Decreased ROM of left shoulder  Left shoulder pain  Decreased ROM of right shoulder  Right shoulder pain  Weakness of shoulder      Subjective Assessment - 03/26/15 1130    Subjective I have been hurting more over the weekend, maybe the weather.  My knees ache today   Currently in Pain? Yes   Pain Score 6    Pain Location Shoulder   Pain Orientation Left   Pain Descriptors / Indicators Aching   Pain Type Chronic pain   Pain Onset More than a month ago   Pain Frequency Constant   Aggravating Factors  reaching behind, and over head   Pain Relieving Factors rest, ice                         [facility] Adult PT Treatment/Exercise - 03/26/15 0001    Lumbar Exercises: Aerobic   UBE (Upper Arm Bike) L3 x8   Knee/Hip Exercises: Aerobic   Nustep level 4 8 minutes   Shoulder Exercises: Supine   Flexion 20 reps;Both   Theraband Level (Shoulder Flexion) --  cabinet reach   Shoulder Flexion Weight (lbs) 1-2#   ABduction 20 reps   Theraband Level (Shoulder ABduction) Other (comment)  cabinet reach   Shoulder ABduction Weight (lbs) 1#    ABduction Limitations both arms   Other Supine Exercises supine punches and serratus with 3#   Other Supine Exercises rebounder toss two sizes of weighted balls.   Shoulder Exercises: Standing   External Rotation Strengthening;Both;20 reps;Theraband   Theraband Level (Shoulder External Rotation) Level 2 (Red);Level 3 (Green)   Shoulder Exercises: ROM/Strengthening   Other ROM/Strengthening Exercises wall slides, circles, overhead adduction/abduction with 1# and no weight   Shoulder Exercises: Stretch   Star Gazer Stretch 10 seconds;3 reps   Other Shoulder Stretches gentle passive stretches of the shoulders in supine   Shoulder Exercises: Power [employer] 20 reps   Other Power [employer] Exercises Lat pull downs #10 x20   Moist [employer] Therapy   Number Minutes Moist Heat 15 Minutes   Moist Heat Location Shoulder                  PT Short Term Goals - 03/05/15 1347    PT SHORT TERM GOAL #1   Title independent with initial HEP   Status Partially Met           PT Long Term Goals - 03/26/15 1215    PT LONG TERM GOAL #1   Title  pt. will be able to lift 1-5 lbs overhead with LUE without pain for ability to do ADL   Status Partially Met   PT LONG TERM GOAL #2   Title increase AROM of the left shoulder to 130 degrees flexion   Status On-going               Plan - 03/26/15 1213    Clinical Impression Statement In supine has very good ROM both shoulders, with wall slides she does very well, but with weight she has much difficulty.  C/O pain in the shoulders   PT Next Visit Plan Progress strength and flexibility   Consulted and Agree with Plan of Care Patient        Problem List Patient Active Problem List   Diagnosis Date Noted  . Callus of foot 11/24/2012  . Pain in joint, ankle and foot 09/15/2012    Sumner Boast., PT 03/26/2015, 12:17 PM  [place], [place],  [address] Phone: [phone]   Fax:  [phone]

## 2015-03-30 ENCOUNTER — Encounter: Payer: Self-pay | Admitting: Physical Therapy

## 2015-03-30 ENCOUNTER — Ambulatory Visit: Payer: Medicare Other | Admitting: Physical Therapy

## 2015-03-30 DIAGNOSIS — M25612 Stiffness of left shoulder, not elsewhere classified: Secondary | ICD-10-CM

## 2015-03-30 DIAGNOSIS — M25512 Pain in left shoulder: Secondary | ICD-10-CM

## 2015-03-30 DIAGNOSIS — M7582 Other shoulder lesions, left shoulder: Secondary | ICD-10-CM | POA: Diagnosis not present

## 2015-03-30 DIAGNOSIS — R29898 Other symptoms and signs involving the musculoskeletal system: Secondary | ICD-10-CM

## 2015-03-30 NOTE — Therapy (Signed)
[place], [place], [address] Phone: [phone]   Fax:  [phone]  Physical Therapy Treatment  Patient Details  Name: JESSEKA DRINKARD MRN: [id number] Date of Birth: Aug 22, 1928 No Data Recorded  Encounter Date: 03/30/2015      PT End of Session - 03/30/15 1055    Visit Number 7   Date for PT Re-Evaluation 04/30/15   PT Start Time 6433   PT Stop Time 1055   PT Time Calculation (min) 40 min   Activity Tolerance Patient limited by pain   Behavior During Therapy [facility] for tasks assessed/performed      Past Medical History  Diagnosis Date  . Hypertension   . Corns and callosities   . Callus     History reviewed. No pertinent past surgical history.  There were no vitals filed for this visit.  Visit Diagnosis:  Left shoulder pain  Decreased ROM of left shoulder  Weakness of shoulder      Subjective Assessment - 03/30/15 1019    Subjective My pain starts with the movement.    Currently in Pain? Yes   Pain Score 4    Pain Location Shoulder   Pain Orientation Left                         [facility] Adult PT Treatment/Exercise - 03/30/15 0001    Lumbar Exercises: Aerobic   UBE (Upper Arm Bike) L0 x8   Knee/Hip Exercises: Aerobic   Nustep level 4 8 minutes   Shoulder Exercises: Supine   Other Supine Exercises supine punches and serratus with 3#   Other Supine Exercises Supine rhymic stabilization   Shoulder Exercises: Standing   External Rotation Strengthening;Both;20 reps;Theraband   Theraband Level (Shoulder External Rotation) Level 2 (Red);Level 3 (Green)   Internal Rotation Strengthening;Both;20 reps;Theraband   Theraband Level (Shoulder Internal Rotation) Level 2 (Red)   Flexion AROM;Limitations;20 reps;Both   Shoulder Flexion Weight (lbs) 0   Flexion Limitations decreased ROM right vs Left   Extension Strengthening;Both;20 reps;Theraband   Theraband Level  (Shoulder Extension) Level 2 (Red)   Row Strengthening;Both;20 reps;Theraband   Theraband Level (Shoulder Row) Level 3 (Green)   Other Standing Exercises Standing biceps curls #1 2x10   Shoulder Exercises: ROM/Strengthening   Other ROM/Strengthening Exercises wall slides, circles, overhead adduction/abduction with 1# and no weight   Shoulder Exercises: Power [employer] 20 reps   Other Power [employer] Exercises Lat pull downs #10 x20                  PT Short Term Goals - 03/05/15 1347    PT SHORT TERM GOAL #1   Title independent with initial HEP   Status Partially Met           PT Long Term Goals - 03/26/15 1215    PT LONG TERM GOAL #1   Title pt. will be able to lift 1-5 lbs overhead with LUE without pain for ability to do ADL   Status Partially Met   PT LONG TERM GOAL #2   Title increase AROM of the left shoulder to 130 degrees flexion   Status On-going               Plan - 03/30/15 1056    Clinical Impression Statement Pt unable to complete serratus punches Supine with LUE this date due to pain. Performed well with rhymic stabilization. Able  to complete all other interventions but c/o shoulder pain.   Pt will benefit from skilled therapeutic intervention in order to improve on the following deficits Decreased range of motion;Decreased strength;Increased muscle spasms;Pain   Rehab Potential Good   PT Frequency 2x / week   PT Duration 8 weeks   PT Treatment/Interventions Cryotherapy;Ultrasound;Moist Heat;Electrical Stimulation;Therapeutic exercise;Manual techniques;Patient/family education;Passive range of motion   PT Next Visit Plan Progress strength and flexibility        Problem List Patient Active Problem List   Diagnosis Date Noted  . Callus of foot 11/24/2012  . Pain in joint, ankle and foot 09/15/2012    Scot Jun, PTA  03/30/2015, 10:58 AM  [place]  [place], [place], [address] Phone: [phone]   Fax:  [phone]  Name: KERRINGTON SOVA MRN: [id number] Date of Birth: 1929/05/29

## 2015-04-04 ENCOUNTER — Encounter: Payer: Self-pay | Admitting: Physical Therapy

## 2015-04-04 ENCOUNTER — Ambulatory Visit: Payer: Medicare Other | Admitting: Physical Therapy

## 2015-04-04 DIAGNOSIS — M25612 Stiffness of left shoulder, not elsewhere classified: Secondary | ICD-10-CM

## 2015-04-04 DIAGNOSIS — R29898 Other symptoms and signs involving the musculoskeletal system: Secondary | ICD-10-CM

## 2015-04-04 DIAGNOSIS — M7582 Other shoulder lesions, left shoulder: Secondary | ICD-10-CM | POA: Diagnosis not present

## 2015-04-04 DIAGNOSIS — M25511 Pain in right shoulder: Secondary | ICD-10-CM

## 2015-04-04 DIAGNOSIS — M25611 Stiffness of right shoulder, not elsewhere classified: Secondary | ICD-10-CM

## 2015-04-04 DIAGNOSIS — M25512 Pain in left shoulder: Secondary | ICD-10-CM

## 2015-04-04 NOTE — Therapy (Signed)
[facility] [place] [address] [place], [name], [address] Phone: [phone]   Fax:  [phone]  Physical Therapy Treatment  Patient Details  Name: Danielle Dean MRN: [id number] Date of Birth: 1928-12-16 No Data Recorded  Encounter Date: 04/04/2015      PT End of Session - 04/04/15 1145    Visit Number 8   Date for PT Re-Evaluation 04/30/15   PT Start Time 1053   PT Stop Time 1144   PT Time Calculation (min) 51 min   Activity Tolerance Patient limited by pain   Behavior During Therapy [facility] for tasks assessed/performed      Past Medical History  Diagnosis Date  . Hypertension   . Corns and callosities   . Callus     History reviewed. No pertinent past surgical history.  There were no vitals filed for this visit.  Visit Diagnosis:  Left shoulder pain  Decreased ROM of left shoulder  Weakness of shoulder  Decreased ROM of right shoulder  Right shoulder pain      Subjective Assessment - 04/04/15 1102    Subjective I just start hurting when I move, I am trying to be active at home and do more housework.   Currently in Pain? Yes   Pain Score 4    Pain Location Shoulder   Pain Orientation Left   Pain Descriptors / Indicators Aching   Pain Type Chronic pain   Pain Onset More than a month ago   Pain Frequency Constant   Aggravating Factors  activity   Pain Relieving Factors rest, not moving            [facility] PT Assessment - 04/04/15 0001    AROM   Right Shoulder Flexion 95 Degrees   Right Shoulder ABduction 80 Degrees   Right Shoulder Internal Rotation 60 Degrees   Right Shoulder External Rotation 50 Degrees   Left Shoulder Flexion 150 Degrees   Left Shoulder ABduction 140 Degrees   Left Shoulder Internal Rotation 60 Degrees   Left Shoulder External Rotation 80 Degrees                     [facility] Adult PT Treatment/Exercise - 04/04/15 0001    Neck Exercises: Supine   X to V  Limitations 10   Upper Extremity D1 10 reps   UE D1 Weights (lbs) 1   Upper Extremity D2 10 reps   UE D2 Weights (lbs) 1   Other Supine Exercise small weighted ball overhead lift and side to side lift   Other Supine Exercise 5# chest press on machine with some help   Lumbar Exercises: Aerobic   UBE (Upper Arm Bike) L0 x8   Knee/Hip Exercises: Aerobic   Nustep level 4 8 minutes   Shoulder Exercises: ROM/Strengthening   Other ROM/Strengthening Exercises wall slides, circles, overhead adduction/abduction with 1# and no weight   Shoulder Exercises: Power [employer] 20 reps   Row Limitations 10#   Other Power Museum/gallery curator Lat pull downs #10 x20   Manual Therapy   Manual Therapy Passive ROM   Passive ROM all motions bilaterally, good stretches at end range   Neck Exercises: Marine scientist 20 seconds;2 reps                  PT Short Term Goals - 04/04/15 1147    PT SHORT TERM GOAL #1   Title independent with initial  HEP   Status Achieved           PT Long Term Goals - 04/04/15 1147    PT LONG TERM GOAL #2   Title increase AROM of the left shoulder to 130 degrees flexion   Status Achieved               Plan - 04/04/15 1146    Clinical Impression Statement Patient with c/o pain during exercises, but shows increased ROMover the past month with all motions.     PT Next Visit Plan Progress strength and flexibility   Consulted and Agree with Plan of Care Patient        Problem List Patient Active Problem List   Diagnosis Date Noted  . Callus of foot 11/24/2012  . Pain in joint, ankle and foot 09/15/2012    Jearld LeschALBRIGHT,Tamirah George W., PT 04/04/2015, 11:48 AM  [facility] [place] [address] [place], [place], [address] Phone: [phone]   Fax:  [phone]  Name: Danielle Dean MRN: [id number] Date of Birth: 06/01/1929

## 2015-04-10 ENCOUNTER — Ambulatory Visit: Payer: Medicare Other | Admitting: Physical Therapy

## 2015-04-10 ENCOUNTER — Encounter: Payer: Self-pay | Admitting: Physical Therapy

## 2015-04-10 DIAGNOSIS — M7582 Other shoulder lesions, left shoulder: Secondary | ICD-10-CM | POA: Diagnosis not present

## 2015-04-10 DIAGNOSIS — M25612 Stiffness of left shoulder, not elsewhere classified: Secondary | ICD-10-CM

## 2015-04-10 DIAGNOSIS — R29898 Other symptoms and signs involving the musculoskeletal system: Secondary | ICD-10-CM

## 2015-04-10 DIAGNOSIS — M25512 Pain in left shoulder: Secondary | ICD-10-CM

## 2015-04-10 NOTE — Therapy (Signed)
[facility] [address] [place], [place], [address] Phone: [phone]   Fax:  [phone]  Physical Therapy Treatment  Patient Details  Name: Danielle Dean MRN: [id number] Date of Birth: 06/08/1929 No Data Recorded  Encounter Date: 04/10/2015      PT End of Session - 04/10/15 1226    Visit Number 9   Date for PT Re-Evaluation 04/30/15   PT Start Time 1145   PT Stop Time 1226   PT Time Calculation (min) 41 min   Activity Tolerance Patient limited by pain   Behavior During Therapy [facility] for tasks assessed/performed      Past Medical History  Diagnosis Date  . Hypertension   . Corns and callosities   . Callus     History reviewed. No pertinent past surgical history.  There were no vitals filed for this visit.  Visit Diagnosis:  Weakness of shoulder  Left shoulder pain  Decreased ROM of left shoulder      Subjective Assessment - 04/10/15 1149    Subjective Pt repots pain with movement LUE   Currently in Pain? Yes   Pain Score 4    Pain Location Shoulder   Pain Orientation Left   Pain Descriptors / Indicators Aching                         [facility] Adult PT Treatment/Exercise - 04/10/15 0001    Neck Exercises: Supine   Shoulder Flexion 10 reps;Left  2 sets    Upper Extremity D1 10 reps   UE D1 Weights (lbs) 0   Upper Extremity D2 10 reps   UE D2 Weights (lbs) 1   Other Supine Exercise Supine LUE rhymic stabilization 3 sets 15 sec    Other Supine Exercise serratus punches #1 x20, horizontal add x15    Lumbar Exercises: Aerobic   UBE (Upper Arm Bike) L0 x8   Knee/Hip Exercises: Aerobic   Nustep level 4 8 minutes   Shoulder Exercises: ROM/Strengthening   Other ROM/Strengthening Exercises wall slides, circles, overhead adduction/abduction with 1# and no weight   Shoulder Exercises: Power Hex[employer] Specialty Chemicalsower   Row 20 reps   Row Limitations #5   Other Power Tower Exercises Lat pull downs #10  x20                  PT Short Term Goals - 04/04/15 1147    PT SHORT TERM GOAL #1   Title independent with initial HEP   Status Achieved           PT Long Term Goals - 04/04/15 1147    PT LONG TERM GOAL #2   Title increase AROM of the left shoulder to 130 degrees flexion   Status Achieved               Plan - 04/10/15 1227    Clinical Impression Statement Continues to report pain during exercises in L shoulder. ROM remains good pain with movement   Pt will benefit from skilled therapeutic intervention in order to improve on the following deficits Decreased range of motion;Decreased strength;Increased muscle spasms;Pain   Rehab Potential Good   PT Frequency 2x / week   PT Duration 8 weeks   PT Treatment/Interventions Cryotherapy;Ultrasound;Moist Heat;Electrical Stimulation;Therapeutic exercise;Manual techniques;Patient/family education;Passive range of motion   PT Next Visit Plan Progress strength and flexibility        Problem List Patient Active Problem List  Diagnosis Date Noted  . Callus of foot 11/24/2012  . Pain in joint, ankle and foot 09/15/2012    Grayce Sessions, PTA  04/10/2015, 12:28 PM  [facility] [place] [address] [place], [name], [address] Phone: [phone]   Fax:  [phone]  Name: Danielle Dean MRN: [id number] Date of Birth: 18-Apr-1929

## 2015-04-12 ENCOUNTER — Ambulatory Visit: Payer: Medicare Other | Admitting: Physical Therapy

## 2015-04-12 ENCOUNTER — Encounter: Payer: Self-pay | Admitting: Physical Therapy

## 2015-04-12 DIAGNOSIS — M25512 Pain in left shoulder: Secondary | ICD-10-CM

## 2015-04-12 DIAGNOSIS — R29898 Other symptoms and signs involving the musculoskeletal system: Secondary | ICD-10-CM

## 2015-04-12 DIAGNOSIS — M7582 Other shoulder lesions, left shoulder: Secondary | ICD-10-CM | POA: Diagnosis not present

## 2015-04-12 NOTE — Therapy (Signed)
[facility] [address] [place], [place], [address] Phone: [phone]   Fax:  [phone]  Physical Therapy Treatment  Patient Details  Name: Danielle Dean MRN: [id number] Date of Birth: 01/12/1929 No Data Recorded  Encounter Date: 04/12/2015      PT End of Session - 04/12/15 1149    Visit Number 10   Date for PT Re-Evaluation 04/30/15   PT Start Time 1137   PT Stop Time 1213   PT Time Calculation (min) 36 min      Past Medical History  Diagnosis Date  . Hypertension   . Corns and callosities   . Callus     History reviewed. No pertinent past surgical history.  There were no vitals filed for this visit.  Visit Diagnosis:  Left shoulder pain  Weakness of shoulder      Subjective Assessment - 04/12/15 1149    Subjective Pt reports no change, still has pain with movement   Patient Stated Goals less pain and easier motions to reach for toilet paper, some difficulty with dressing and doing her hair   Currently in Pain? Yes   Pain Score 4    Pain Location Shoulder   Pain Orientation Left   Pain Descriptors / Indicators Aching                         [facility] Adult PT Treatment/Exercise - 04/12/15 0001    Lumbar Exercises: Aerobic   UBE (Upper Arm Bike) L0 x8   Knee/Hip Exercises: Aerobic   Nustep level 4 8 minutes   Shoulder Exercises: Standing   Flexion 20 reps;Strengthening;Both;Weights  rest   Shoulder Flexion Weight (lbs) 1   ABduction AROM;Both;20 reps   Shoulder ABduction Weight (lbs) 0   Extension Strengthening;Both;20 reps;Theraband   Theraband Level (Shoulder Extension) Level 2 (Red)   Row Strengthening;Both;20 reps;Theraband   Theraband Level (Shoulder Row) Level 2 (Red)   Other Standing Exercises Standing biceps curls #2 2x10   Shoulder Exercises: ROM/Strengthening   Other ROM/Strengthening Exercises wall slides, circles, overhead adduction/abduction with 1# and no  weight   Shoulder Exercises: Power [employer]   Row 20 reps   Row Limitations #5   Other Power Tower Exercises Lat pull downs #10 x20                  PT Short Term Goals - 04/04/15 1147    PT SHORT TERM GOAL #1   Title independent with initial HEP   Status Achieved           PT Long Term Goals - 04/04/15 1147    PT LONG TERM GOAL #2   Title increase AROM of the left shoulder to 130 degrees flexion   Status Achieved               Plan - 04/12/15 1215    Clinical Impression Statement Pt has to end PT session early because her ride came early. Able to complete all interventions but continues to report pain with movement with RUE   Pt will benefit from skilled therapeutic intervention in order to improve on the following deficits Decreased range of motion;Decreased strength;Increased muscle spasms;Pain   Rehab Potential Good   PT Frequency 2x / week   PT Duration 8 weeks   PT Treatment/Interventions Cryotherapy;Ultrasound;Moist Heat;Electrical Stimulation;Therapeutic exercise;Manual techniques;Patient/family education;Passive range of motion   PT Next Visit Plan Progress strength and flexibility  Problem List Patient Active Problem List   Diagnosis Date Noted  . Callus of foot 11/24/2012  . Pain in joint, ankle and foot 09/15/2012    Grayce Sessions, PTA 04/12/2015, 12:17 PM  [facility] [address] [place], [name], [address] Phone: [phone]   Fax:  [phone]  Name: Danielle Dean MRN: [id number] Date of Birth: [birth year]

## 2015-04-16 ENCOUNTER — Encounter: Payer: Self-pay | Admitting: Physical Therapy

## 2015-04-16 ENCOUNTER — Ambulatory Visit: Payer: Medicare Other | Admitting: Physical Therapy

## 2015-04-16 DIAGNOSIS — M25512 Pain in left shoulder: Secondary | ICD-10-CM

## 2015-04-16 DIAGNOSIS — M7582 Other shoulder lesions, left shoulder: Secondary | ICD-10-CM | POA: Diagnosis not present

## 2015-04-16 DIAGNOSIS — R29898 Other symptoms and signs involving the musculoskeletal system: Secondary | ICD-10-CM

## 2015-04-16 NOTE — Therapy (Signed)
[facility] [address] [place], [name], [address] Phone: [phone]   Fax:  [phone]  Physical Therapy Treatment  Patient Details  Name: Danielle Dean MRN: [id number] Date of Birth: 04/23/1929 No Data Recorded  Encounter Date: 04/16/2015      PT End of Session - 04/16/15 1143    Visit Number 11   Date for PT Re-Evaluation 04/30/15   PT Start Time 1056   PT Stop Time 1143   PT Time Calculation (min) 47 min   Activity Tolerance Patient limited by pain   Behavior During Therapy [facility] for tasks assessed/performed      Past Medical History  Diagnosis Date  . Hypertension   . Corns and callosities   . Callus     History reviewed. No pertinent past surgical history.  There were no vitals filed for this visit.  Visit Diagnosis:  Weakness of shoulder  Left shoulder pain      Subjective Assessment - 04/16/15 1056    Subjective Pt reports that her L arm still gives her problems.   Limitations Lifting;House hold activities   Currently in Pain? Yes   Pain Score 4    Pain Location Shoulder   Pain Orientation Left                         [facility] Adult PT Treatment/Exercise - 04/16/15 0001    Neck Exercises: Supine   Shoulder Flexion 15 reps   Upper Extremity D1 15 reps   UE D1 Weights (lbs) 0   Upper Extremity D2 10 reps;15 reps   UE D2 Weights (lbs) 0   Other Supine Exercise Supine LUE rhymic stabilization 3 sets 15 sec    Other Supine Exercise serratus punches #1 x20, horizontal add x15    Lumbar Exercises: Aerobic   UBE (Upper Arm Bike) L0 x8   Knee/Hip Exercises: Aerobic   Nustep level 4 8 minutes   Shoulder Exercises: Standing   Flexion Strengthening;Both;Weights;15 reps   Shoulder Flexion Weight (lbs) 1   ABduction AROM;Both;20 reps   Shoulder ABduction Weight (lbs) 0   Row Strengthening;Both;20 reps;Theraband   Theraband Level (Shoulder Row) Level 2 (Red)   Other  Standing Exercises Standing biceps curls #2 2x10   Shoulder Exercises: ROM/Strengthening   Other ROM/Strengthening Exercises wall slides, circles, overhead adduction/abduction with 1# and no weight   Shoulder Exercises: Power [employer] 20 reps   Row Limitations #5   Other Power Tower Exercises Lat pull downs #10 x20                  PT Short Term Goals - 04/04/15 1147    PT SHORT TERM GOAL #1   Title independent with initial HEP   Status Achieved           PT Long Term Goals - 04/04/15 1147    PT LONG TERM GOAL #2   Title increase AROM of the left shoulder to 130 degrees flexion   Status Achieved               Plan - 04/16/15 1144    Clinical Impression Statement ROM remains good but has L shoulder pain with movement. Able to complete all exercises    Pt will benefit from skilled therapeutic intervention in order to improve on the following deficits Decreased range of motion;Decreased strength;Increased muscle spasms;Pain   Rehab Potential Good   PT  Frequency 2x / week   PT Duration 8 weeks   PT Treatment/Interventions Cryotherapy;Ultrasound;Moist Heat;Electrical Stimulation;Therapeutic exercise;Manual techniques;Patient/family education;Passive range of motion   PT Next Visit Plan D/C        Problem List Patient Active Problem List   Diagnosis Date Noted  . Callus of foot 11/24/2012  . Pain in joint, ankle and foot 09/15/2012    Grayce Sessionsonald G Leoncio Hansen, PTA  04/16/2015, 11:45 AM  [facility] [address] [place], [place], [address] Phone: [phone]   Fax:  [phone]  Name: Danielle Dean MRN: [id number] Date of Birth: 01/27/1929

## 2015-04-19 ENCOUNTER — Ambulatory Visit: Payer: Medicare Other | Attending: Rheumatology | Admitting: Physical Therapy

## 2015-04-19 ENCOUNTER — Encounter: Payer: Self-pay | Admitting: Physical Therapy

## 2015-04-19 DIAGNOSIS — M25512 Pain in left shoulder: Secondary | ICD-10-CM | POA: Diagnosis not present

## 2015-04-19 DIAGNOSIS — R29898 Other symptoms and signs involving the musculoskeletal system: Secondary | ICD-10-CM | POA: Insufficient documentation

## 2015-04-19 DIAGNOSIS — M7582 Other shoulder lesions, left shoulder: Secondary | ICD-10-CM | POA: Diagnosis present

## 2015-04-19 DIAGNOSIS — M25612 Stiffness of left shoulder, not elsewhere classified: Secondary | ICD-10-CM

## 2015-04-19 NOTE — Therapy (Signed)
[facility] [place], [place], [address] Phone: [phone]   Fax:  [phone]  Physical Therapy Treatment  Patient Details  Name: HARUMI YAMIN MRN: [id number] Date of Birth: 12/05/1928 No Data Recorded  Encounter Date: 04/19/2015      PT End of Session - 04/19/15 1223    Visit Number 12   Date for PT Re-Evaluation 04/30/15   PT Start Time 1140   PT Stop Time 1223   PT Time Calculation (min) 43 min      Past Medical History  Diagnosis Date  . Hypertension   . Corns and callosities   . Callus     History reviewed. No pertinent past surgical history.  There were no vitals filed for this visit.  Visit Diagnosis:  Left shoulder pain  Weakness of shoulder  Decreased ROM of left shoulder      Subjective Assessment - 04/19/15 1140    Subjective Pt continues to report no change. still has shoulder pain with movement.   Currently in Pain? Yes   Pain Score 4    Pain Orientation Left   Pain Descriptors / Indicators Aching   Pain Type Chronic pain            [facility] PT Assessment - 04/19/15 0001    AROM   Right Shoulder Flexion 85 Degrees   Right Shoulder ABduction 70 Degrees   Right Shoulder Internal Rotation 70 Degrees   Right Shoulder External Rotation 40 Degrees   Left Shoulder Flexion 160 Degrees   Left Shoulder ABduction 140 Degrees   Left Shoulder Internal Rotation 70 Degrees   Left Shoulder External Rotation 8 Degrees   Strength   Overall Strength Comments right shoulder 3+/5 in the available ROM, left shoulder 3+/5 in the available ROM with pain for all resisted tests on the left shoulder                     [facility] Adult PT Treatment/Exercise - 04/19/15 0001    Lumbar Exercises: Aerobic   UBE (Upper Arm Bike) L0 x8   Knee/Hip Exercises: Aerobic   Nustep level 4 8 minutes   Shoulder Exercises: Standing   External Rotation Strengthening;Both;Theraband;15 reps    Theraband Level (Shoulder External Rotation) Level 2 (Red)   Flexion Strengthening;Both;Weights;15 reps   Shoulder Flexion Weight (lbs) 1   ABduction 10 reps   Shoulder ABduction Weight (lbs) 0   Extension Strengthening;Both;Theraband;12 reps   Theraband Level (Shoulder Extension) Level 2 (Red)   Row Strengthening;Both;Theraband;15 reps   Theraband Level (Shoulder Row) Level 2 (Red)   Other Standing Exercises Standing biceps curls #2 2x10   Other Standing Exercises wall push ups x10    Shoulder Exercises: ROM/Strengthening   Other ROM/Strengthening Exercises wall slides, circles, overhead adduction/abduction and no weight   Shoulder Exercises: Power Tower   Row 10 reps;20 reps   Row Limitations #5   Other Power [employer] Exercises Lat pull downs #10 x10                  PT Short Term Goals - 04/04/15 1147    PT SHORT TERM GOAL #1   Title independent with initial HEP   Status Achieved           PT Long Term Goals - 04/04/15 1147    PT LONG TERM GOAL #2   Title increase AROM of the left shoulder to 130 degrees flexion  Status Achieved               Plan - 04/19/15 1223    Clinical Impression Statement Pt seem to reached a plato with no functional improvement. ROM is well but reports shoulder pain with movement.   Pt will benefit from skilled therapeutic intervention in order to improve on the following deficits Decreased range of motion;Decreased strength;Increased muscle spasms;Pain   Rehab Potential Good   PT Treatment/Interventions Cryotherapy;Ultrasound;Moist Heat;Electrical Stimulation;Therapeutic exercise;Manual techniques;Patient/family education;Passive range of motion   PT Next Visit Plan d/c        Problem List Patient Active Problem List   Diagnosis Date Noted  . Callus of foot 11/24/2012  . Pain in joint, ankle and foot 09/15/2012     PHYSICAL THERAPY DISCHARGE SUMMARY  Visits from Start of Care: 12   Plan: Patient agrees to  discharge.  Patient goals were not met. Patient is being discharged due to lack of progress.  ?????       Scot Jun, PTA  04/19/2015, 12:26 PM  [place] [address] [address] [place], [place], [address] Phone: [phone]   Fax:  [phone]  Name: JAVEAH LOEZA MRN: [id number] Date of Birth: 01-28-1929

## 2015-11-15 ENCOUNTER — Encounter: Payer: Self-pay | Admitting: Podiatry

## 2015-11-15 ENCOUNTER — Ambulatory Visit (INDEPENDENT_AMBULATORY_CARE_PROVIDER_SITE_OTHER): Payer: Medicare Other | Admitting: Podiatry

## 2015-11-15 VITALS — BP 161/78 | HR 53 | Resp 16

## 2015-11-15 DIAGNOSIS — M2042 Other hammer toe(s) (acquired), left foot: Secondary | ICD-10-CM

## 2015-11-15 DIAGNOSIS — L84 Corns and callosities: Secondary | ICD-10-CM

## 2015-11-16 NOTE — Progress Notes (Signed)
Subjective:     Patient ID: Danielle Dean, female   DOB: 04/07/1929, 82 y.o.   MRN: [id number]  HPI patient presents with her daughter with numerous concerns about hammertoe correction and chronic keratotic lesion fifth digit left over right   Review of Systems     Objective:   Physical Exam Neurovascular status was found to be intact muscle strength adequate with significant distal rotation digit 5 left over right with distal lateral keratotic lesion noted and pain when palpated    Assessment:     Hammertoe deformity fifth digit left over right with distal lateral keratotic lesion    Plan:     Reviewed condition and discussed consideration for derotational arthroplasty and this time debridement was accomplished padding applied and we will evaluate how long it gives her relief for. If she's able to get relatively long periods of relief of several months we can continue trimming but the pain truly comes back in the next several weeks or within several months we may need to consider surgical intervention in this case

## 2015-11-27 ENCOUNTER — Ambulatory Visit
Admission: RE | Admit: 2015-11-27 | Discharge: 2015-11-27 | Disposition: A | Discharge status: Home / Self Care | Payer: Medicare Other | Source: Ambulatory Visit | Attending: Internal Medicine | Admitting: Internal Medicine

## 2015-11-27 ENCOUNTER — Other Ambulatory Visit: Payer: Self-pay | Admitting: Internal Medicine

## 2015-11-27 DIAGNOSIS — R05 Cough: Secondary | ICD-10-CM

## 2015-11-27 DIAGNOSIS — R059 Cough, unspecified: Secondary | ICD-10-CM

## 2015-11-28 ENCOUNTER — Other Ambulatory Visit: Payer: Self-pay | Admitting: Internal Medicine

## 2015-11-28 DIAGNOSIS — R911 Solitary pulmonary nodule: Secondary | ICD-10-CM

## 2015-12-06 ENCOUNTER — Ambulatory Visit
Admission: RE | Admit: 2015-12-06 | Discharge: 2015-12-06 | Disposition: A | Discharge status: Home / Self Care | Payer: Medicare Other | Source: Ambulatory Visit | Attending: Internal Medicine | Admitting: Internal Medicine

## 2015-12-06 DIAGNOSIS — R911 Solitary pulmonary nodule: Secondary | ICD-10-CM

## 2015-12-06 MED ORDER — IOPAMIDOL (ISOVUE-300) INJECTION 61%
75.0000 mL | Freq: Once | INTRAVENOUS | Status: AC | PRN
  Administered 2015-12-06: 75 mL via INTRAVENOUS

## 2016-01-16 ENCOUNTER — Encounter: Payer: Self-pay | Admitting: Podiatry

## 2016-01-16 ENCOUNTER — Ambulatory Visit (INDEPENDENT_AMBULATORY_CARE_PROVIDER_SITE_OTHER): Payer: Medicare Other | Admitting: Podiatry

## 2016-01-16 DIAGNOSIS — M2042 Other hammer toe(s) (acquired), left foot: Secondary | ICD-10-CM

## 2016-01-16 DIAGNOSIS — L84 Corns and callosities: Secondary | ICD-10-CM

## 2016-01-16 NOTE — Progress Notes (Signed)
Subjective:     Patient ID: Danielle Dean, female   DOB: 05/16/1929, 82 y.o.   MRN: [id number]  HPI patient states she's having a lot of pain in her toe she knows that she needs to get this toe fixed   Review of Systems     Objective:   Physical Exam Neurovascular status intact muscle strength adequate range of motion within normal limits with patient found to have a severe rotation fifth digit left with distal lateral exostotic lesion that's painful when pressed and pain also under the fifth toe right but not to the same degree    Assessment:     Digital deformity consistent with distal hammertoe and distal lateral exostosis fifth left along with keratotic lesion that's painful when pressed    Plan:     H&P condition reviewed and after anesthetizing the fifth digit I did remove the deep callus tissue and discussed again distal arthroplasty with exostectomy. Patient wants to get this done but this can wait till her daughter is intact

## 2016-02-21 ENCOUNTER — Other Ambulatory Visit: Payer: Self-pay

## 2016-04-20 NOTE — Progress Notes (Signed)
*IMAGE* Office Visit Note  Patient: Danielle Dean             Date of Birth: [birth year]           MRN: [id number]             PCP: Georgann Housekeeper, MD Referring: Georgann Housekeeper, MD Visit Date: 04/23/2016 Occupation:@GUAROCC @    Subjective:  Pain of the Right Knee; Pain of the Lower Back; and Pain of the Right Shoulder   History of Present Illness: Danielle Dean is a 82 y.o. female  On Visit Date: 12/24/2015===>HPI:  [name] is an 82 year old female with a history of osteoarthritis.  She recently completed her bilateral knee joint Euflexxa injections, and she states that last Wednesday she fell and landed on her bilateral knee joints.  She came to see Mr. Leane Call, my PA, and at that time her right knee joint was hurting.  She had a right knee joint cortisone injection on 12/21/2015.  Because she is sensitive to cortisone injection, she was given a cortisone injection only in one knee.  She states her right knee joint is feeling better, but her left knee joint is still hurting.  She has a lot of bruising on her bilateral lower extremities after the fall.  She is also fighting a cold and cough.  She states her mobility is somewhat better, but it is not a great improvement.  She gives a history of morning stiffness.   On Visit Date: 12/24/2015===>IMPRESSION AND PLAN:  We had a detailed discussion regarding her left lower extremity.  I think the bruising is going to take some time, and the discomfort she is experiencing will also take some time.  We will give some time for the viscosupplement injections to work.  I have advised her to use Pennsaid, which she has at home, topically and also use ice.  She will be returning for followup on a p.r.n. basis.   Today, the patient states: whole body is hurting.   Activities of Daily Living:  Patient reports morning stiffness for 30 minutes.   Patient Reports nocturnal pain.  Difficulty dressing/grooming: Reports Difficulty climbing stairs:  Reports Difficulty getting out of chair: Reports Difficulty using hands for taps, buttons, cutlery, and/or writing: Reports   Review of Systems  Constitutional: Negative for fatigue.  HENT: Negative for mouth sores and mouth dryness.   Eyes: Negative for dryness.  Respiratory: Negative for shortness of breath.   Gastrointestinal: Negative for constipation and diarrhea.  Musculoskeletal: Negative for myalgias and myalgias.  Skin: Negative for sensitivity to sunlight.  Psychiatric/Behavioral: Negative for decreased concentration and sleep disturbance.    PMFS History:  Patient Active Problem List   Diagnosis Date Noted  . Callus of foot 11/24/2012  . Pain in joint, ankle and foot 09/15/2012    Past Medical History:  Diagnosis Date  . Callus   . Corns and callosities   . Hypertension     History reviewed. No pertinent family history. Past Surgical History:  Procedure Laterality Date  . ABDOMINAL HYSTERECTOMY    . NASAL SEPTUM SURGERY    . ROTATOR CUFF REPAIR Right    Social History   Social History Narrative  . No narrative on file   On Visit Date: 12/24/2015===>SOCIAL HISTORY:  She is a nonsmoker, does not drink any alcohol, drinks caffeinated tea, and does walk for exercise.  On Visit Date: 12/24/2015===>CURRENT MEDICATIONS:  Includes atenolol and losartan.  On Visit Date: 12/24/2015===>MEDICATION ALLERGIES:  No known drug  allergies.  Objective: Vital Signs: BP (!) 175/65 (BP Location: Left Arm, Patient Position: Sitting)   Pulse (!) 56   Resp 12   Ht 4\' 8"  (1.422 m)   Wt 109 lb (49.4 kg)   BMI 24.44 kg/m    Physical Exam  Constitutional: She is oriented to person, place, and time. She appears well-developed and well-nourished.  HENT:  Head: Normocephalic and atraumatic.  Eyes: EOM are normal. Pupils are equal, round, and reactive to light.  Cardiovascular: Normal rate, regular rhythm and normal heart sounds.  Exam reveals no gallop and no friction rub.   No  murmur heard. Pulmonary/Chest: Effort normal and breath sounds normal. She has no wheezes. She has no rales.  Abdominal: Soft. Bowel sounds are normal. She exhibits no distension. There is no tenderness. There is no guarding. No hernia.  Musculoskeletal: Normal range of motion. She exhibits no edema, tenderness or deformity.  Lymphadenopathy:    She has no cervical adenopathy.  Neurological: She is alert and oriented to person, place, and time. Coordination normal.  Skin: Skin is warm and dry. Capillary refill takes less than 2 seconds. No rash noted.  Psychiatric: She has a normal mood and affect. Her behavior is normal.  Nursing note and vitals reviewed.    Musculoskeletal Exam:  Full range of motion of all joints Grip strength is equal and strong bilaterally For myalgia tender points are all absent  CDAI Exam: CDAI Homunculus Exam:   Tenderness:  RUE: glenohumeral LUE: glenohumeral Right hand: 2nd PIP, 3rd PIP, 4th PIP, 5th PIP, 2nd DIP, 3rd DIP, 4th DIP and 5th DIP Left hand: 2nd PIP, 3rd PIP, 4th PIP, 5th PIP, 2nd DIP, 3rd DIP, 4th DIP and 5th DIP RLE: tibiofemoral  Joint Counts:  CDAI Tender Joint count: 11 CDAI Swollen Joint count: 0     Investigation: Findings:  =======================   INVESTIGATIONS:  She also had x-rays done by Mr. Leane Callanwala during the last visit which were unremarkable.  There was no evidence of fracture except for osteoarthritis which was present.   =======================     Imaging: No results found.  Speciality Comments: No specialty comments available.    Procedures:  No procedures performed Allergies: Patient has no known allergies.   Assessment / Plan: Visit Diagnoses: Osteoarthritis of both knees, unspecified osteoarthritis type  Osteoarthritis of both hands, unspecified osteoarthritis type  Myalgia  Chronic pain of right knee    Note that today's blood pressure is 175/65. Patient takes atenolol in the morning and  then the evening dose as well. She needs a knee injection today with cortisone but I am a little worried with her blood pressure. Patient states that her systolic is always high. Reviewing her blood pressure from the July visit shows that they were about 150-in the systolic.  Right knee joint pain. Patient rates the pain as 6-7 on a scale of 0-10. Has significant amount of stiffness going onto the right knee joint. Is requesting a cortisone injection if appropriate. We are worried about her blood pressure of 175/65.  Left knee is doing really well. That doesn't cause much problems.  She's had physical supplementation in the past but patient states that it hasn't worked. We think that perhaps a repeat Euflex injections series to the knees or even Hylagan injections of the knees may prove beneficial in the future. We can apply for it starting January 2018 and get the medicine started for the patient as soon as possible  Okay to give handicap  placard to patient for 5 years permanent. Due to arthritis in the knees etc.  Due to the elevated blood pressure 175/65 at the initial part of the visit, we have asked the nurse to repeat manual blood pressure to see if there has been any improvement. The repeat blood pressure is also high at 62/78 and we cannot give her an injection in her knee at this time. She can call us when her blood pressure is better and I'll be happy to give her an injection in her knee only. This information doctor did discuss with the patient and she is agreeable.   Orders: No orders of the defined types were placed in this encounter.  Meds ordered this encounter  Medications  . Omega-3 Fatty Acids (FISH OIL) 1000 MG CAPS    Sig: Take 1 capsule by mouth daily.  Marland Kitchen. losartan (COZAAR) 100 MG tablet    Sig: Take 100 mg by mouth daily.     Face-to-face time spent with patient was 30 minutes. 50% of time was spent in counseling and coordination of care.  Follow-Up Instructions: No  Follow-up on file.  I examined and evaluated the patient with Tawni PummelNaitik Marcayla Budge PA. The plan of care was discussed as noted above.  Pollyann SavoyShaili Deveshwar, MD

## 2016-04-23 ENCOUNTER — Encounter: Payer: Self-pay | Admitting: Rheumatology

## 2016-04-23 ENCOUNTER — Ambulatory Visit (INDEPENDENT_AMBULATORY_CARE_PROVIDER_SITE_OTHER): Payer: Medicare Other | Admitting: Rheumatology

## 2016-04-23 VITALS — BP 162/78 | HR 56 | Resp 12 | Ht <= 58 in | Wt 109.0 lb

## 2016-04-23 DIAGNOSIS — M25561 Pain in right knee: Secondary | ICD-10-CM

## 2016-04-23 DIAGNOSIS — G8929 Other chronic pain: Secondary | ICD-10-CM

## 2016-04-23 DIAGNOSIS — M791 Myalgia, unspecified site: Secondary | ICD-10-CM

## 2016-04-23 DIAGNOSIS — M170 Bilateral primary osteoarthritis of knee: Secondary | ICD-10-CM | POA: Diagnosis not present

## 2016-04-23 DIAGNOSIS — M19042 Primary osteoarthritis, left hand: Secondary | ICD-10-CM

## 2016-04-23 DIAGNOSIS — M19041 Primary osteoarthritis, right hand: Secondary | ICD-10-CM

## 2016-05-19 DIAGNOSIS — M170 Bilateral primary osteoarthritis of knee: Secondary | ICD-10-CM | POA: Insufficient documentation

## 2016-05-19 NOTE — Progress Notes (Signed)
Office Visit Note  Patient: Danielle Dean             Date of Birth: 06/10/1929           MRN: [id number]             PCP: Georgann Housekeeper, MD Referring: Georgann Housekeeper, MD Visit Date: 05/20/2016 Occupation: @GUAROCC @    Subjective:  Bilateral knee joint pain   History of Present Illness: Danielle Dean is a 82 y.o. female with osteoarthritis of knee joints and hands. She states she had Euflexxa injection to her knee joints which she finished about 3 months ago and did not have a good response to that. She continues to have pain and discomfort in her bilateral knee joints and her lower extremity muscles. She also has some stiffness in her hands. She's been having lower back pain off and on for some time. It is localized without any radiculopathy. She reports positive pedal edema.   Activities of Daily Living:  Patient reports morning stiffness for 1 hour.   Patient Reports nocturnal pain.  Difficulty dressing/grooming: Denies Difficulty climbing stairs: Reports Difficulty getting out of chair: Reports Difficulty using hands for taps, buttons, cutlery, and/or writing: Reports   Review of Systems  Constitutional: Negative for fatigue, night sweats, weight gain, weight loss and weakness.  HENT: Negative for mouth sores, trouble swallowing, trouble swallowing, mouth dryness and nose dryness.   Eyes: Negative for pain, redness, visual disturbance and dryness.  Respiratory: Negative for cough, shortness of breath and difficulty breathing.   Cardiovascular: Positive for hypertension. Negative for chest pain, palpitations, irregular heartbeat and swelling in legs/feet.  Gastrointestinal: Negative for blood in stool, constipation and diarrhea.  Endocrine: Negative for increased urination.  Genitourinary: Negative for vaginal dryness.  Musculoskeletal: Positive for arthralgias, joint pain, myalgias, morning stiffness and myalgias. Negative for joint swelling, muscle weakness and muscle  tenderness.  Skin: Negative for color change, rash, hair loss, skin tightness, ulcers and sensitivity to sunlight.  Allergic/Immunologic: Negative for susceptible to infections.  Neurological: Negative for dizziness, memory loss and night sweats.  Hematological: Negative for swollen glands.  Psychiatric/Behavioral: Positive for sleep disturbance. Negative for depressed mood. The patient is nervous/anxious.     PMFS History:  Patient Active Problem List   Diagnosis Date Noted  . Chronic pain of both knees 05/20/2016  . Primary osteoarthritis of both hands 05/20/2016  . H/O total shoulder replacement, right 05/20/2016  . Essential hypertension 05/20/2016  . Environmental allergies 05/20/2016  . Bilateral primary osteoarthritis of knee 05/19/2016  . Callus of foot 11/24/2012  . Pain in joint, ankle and foot 09/15/2012    Past Medical History:  Diagnosis Date  . Callus   . Cataract fragments in both eyes following surgery   . Corns and callosities   . Hx of hysterectomy   . Hypertension     Family History  Problem Relation Age of Onset  . Arthritis Mother   . Diabetes Father    Past Surgical History:  Procedure Laterality Date  . ABDOMINAL HYSTERECTOMY    . NASAL SEPTUM SURGERY    . ROTATOR CUFF REPAIR Right    Social History   Social History Narrative  . No narrative on file     Objective: Vital Signs: BP (!) 151/66 (BP Location: Left Arm, Patient Position: Sitting, Cuff Size: Normal)   Pulse 69   Resp 14   Ht 4\' 11"  (1.499 m)   Wt 110 lb (49.9 kg)   BMI  22.22 kg/m    Physical Exam  Constitutional: She is oriented to person, place, and time. She appears well-developed and well-nourished.  HENT:  Head: Normocephalic and atraumatic.  Eyes: Conjunctivae and EOM are normal.  Neck: Normal range of motion.  Cardiovascular: Normal rate, regular rhythm, normal heart sounds and intact distal pulses.   Pulmonary/Chest: Effort normal and breath sounds normal.    Abdominal: Soft. Bowel sounds are normal.  Lymphadenopathy:    She has no cervical adenopathy.  Neurological: She is alert and oriented to person, place, and time.  Skin: Skin is warm and dry. Capillary refill takes less than 2 seconds.  Psychiatric: She has a normal mood and affect. Her behavior is normal.  Nursing note and vitals reviewed.    Musculoskeletal Exam: C-spine, thoracic, lumbar spine good range of motion she has mild scoliosis. Right shoulder joint decreased range of motion left shoulder joint for range of motion. She has DIP PIP thickening and subluxation of her hands consistent with osteoarthritis. Hip joints with good range of motion. She has warmth and on palpation of her right knee joint with painful range of motion of bilateral knee joints. She is some pedal edema.  CDAI Exam: No CDAI exam completed.    Investigation: Findings:  Knee x-rays done by Mr. Leane Callanwala during her 12/21/15 visit which were unremarkable.  There was no evidence of fracture except for osteoarthritis which was present.      Imaging: No results found.  Speciality Comments: No specialty comments available.    Procedures:  Large Joint Inj Date/Time: 05/20/2016 2:25 PM Performed by: Pollyann SavoyEVESHWAR, Angelika Jerrett Authorized by: Pollyann SavoyEVESHWAR, Tarryn Bogdan   Consent Given by:  Patient Site marked: the procedure site was marked   Timeout: prior to procedure the correct patient, procedure, and site was verified   Indications:  Pain and joint swelling Location:  Knee Site:  R knee Prep: patient was prepped and draped in usual sterile fashion   Needle Size:  27 G Needle Length:  1.5 inches Approach:  Medial Ultrasound Guidance: No   Fluoroscopic Guidance: No   Arthrogram: No   Medications:  1.5 mL lidocaine 1 %; 30 mg triamcinolone acetonide 40 MG/ML Aspiration Attempted: Yes   Aspirate amount (mL):  0 Patient tolerance:  Patient tolerated the procedure well with no immediate complications   Allergies:  Patient has no known allergies.   Assessment / Plan:     Visit Diagnoses: Bilateral primary osteoarthritis of knee: She has arthritis involving her bilateral knee joints. She had inadequate response to Misco supplement injections.  Chronic pain of both knees: She's been having increased pain in her right knee joint with some warmth. Her blood pressure is elevated today. We decided to do a low-dose cortisone injection due to discomfort. She'll monitor her blood pressure closely. I will refer her to physical therapy for osteoarthritis of bilateral knee joints and lower extremity muscle strengthening. A permanent handicap placard was also given.  Primary osteoarthritis of both hands: She does have osteoarthritic changes in her hands some for which muscle strengthening and joint protection was discussed.  H/O total shoulder replacement, right: She has decreased range of motion.  Essential hypertension: Blood pressure is still elevated she will continue to monitor that.  Environmental allergies  Osteopenia of multiple sites    Orders: Orders Placed This Encounter  Procedures  . Large Joint Injection/Arthrocentesis   No orders of the defined types were placed in this encounter.   Face-to-face time spent with patient was 30 minutes.  50% of time was spent in counseling and coordination of care.  Follow-Up Instructions: Return in about 4 months (around 09/18/2016) for Osteoarthritis.   Pollyann SavoyShaili Keddrick Wyne, MD

## 2016-05-20 ENCOUNTER — Encounter: Payer: Self-pay | Admitting: Rheumatology

## 2016-05-20 ENCOUNTER — Ambulatory Visit (INDEPENDENT_AMBULATORY_CARE_PROVIDER_SITE_OTHER): Payer: Medicare Other | Admitting: Rheumatology

## 2016-05-20 VITALS — BP 151/66 | HR 69 | Resp 14 | Ht 59.0 in | Wt 110.0 lb

## 2016-05-20 DIAGNOSIS — M19041 Primary osteoarthritis, right hand: Secondary | ICD-10-CM

## 2016-05-20 DIAGNOSIS — Z9109 Other allergy status, other than to drugs and biological substances: Secondary | ICD-10-CM | POA: Insufficient documentation

## 2016-05-20 DIAGNOSIS — M25561 Pain in right knee: Secondary | ICD-10-CM

## 2016-05-20 DIAGNOSIS — G8929 Other chronic pain: Secondary | ICD-10-CM | POA: Diagnosis not present

## 2016-05-20 DIAGNOSIS — M170 Bilateral primary osteoarthritis of knee: Secondary | ICD-10-CM | POA: Diagnosis not present

## 2016-05-20 DIAGNOSIS — M8589 Other specified disorders of bone density and structure, multiple sites: Secondary | ICD-10-CM

## 2016-05-20 DIAGNOSIS — M25562 Pain in left knee: Secondary | ICD-10-CM | POA: Diagnosis not present

## 2016-05-20 DIAGNOSIS — I10 Essential (primary) hypertension: Secondary | ICD-10-CM

## 2016-05-20 DIAGNOSIS — Z96611 Presence of right artificial shoulder joint: Secondary | ICD-10-CM

## 2016-05-20 DIAGNOSIS — M19042 Primary osteoarthritis, left hand: Secondary | ICD-10-CM | POA: Insufficient documentation

## 2016-05-20 MED ORDER — TRIAMCINOLONE ACETONIDE 40 MG/ML IJ SUSP
30.0000 mg | INTRAMUSCULAR | Status: AC | PRN
  Administered 2016-05-20: 30 mg via INTRA_ARTICULAR

## 2016-05-20 MED ORDER — LIDOCAINE HCL 1 % IJ SOLN
1.5000 mL | INTRAMUSCULAR | Status: AC | PRN
  Administered 2016-05-20: 1.5 mL

## 2016-05-23 ENCOUNTER — Other Ambulatory Visit: Payer: Self-pay | Admitting: Internal Medicine

## 2016-05-23 ENCOUNTER — Ambulatory Visit
Admission: RE | Admit: 2016-05-23 | Discharge: 2016-05-23 | Disposition: A | Discharge status: Home / Self Care | Payer: Medicare Other | Source: Ambulatory Visit | Attending: Internal Medicine | Admitting: Internal Medicine

## 2016-05-23 DIAGNOSIS — R059 Cough, unspecified: Secondary | ICD-10-CM

## 2016-05-23 DIAGNOSIS — R05 Cough: Secondary | ICD-10-CM

## 2016-06-11 ENCOUNTER — Ambulatory Visit: Payer: Medicare Other | Attending: Rheumatology | Admitting: Physical Therapy

## 2016-06-11 ENCOUNTER — Encounter: Payer: Self-pay | Admitting: Physical Therapy

## 2016-06-11 DIAGNOSIS — M25562 Pain in left knee: Secondary | ICD-10-CM | POA: Diagnosis present

## 2016-06-11 DIAGNOSIS — M25561 Pain in right knee: Secondary | ICD-10-CM | POA: Diagnosis present

## 2016-06-11 DIAGNOSIS — R29898 Other symptoms and signs involving the musculoskeletal system: Secondary | ICD-10-CM | POA: Insufficient documentation

## 2016-06-11 DIAGNOSIS — R296 Repeated falls: Secondary | ICD-10-CM | POA: Diagnosis present

## 2016-06-11 DIAGNOSIS — M6281 Muscle weakness (generalized): Secondary | ICD-10-CM

## 2016-06-11 DIAGNOSIS — R262 Difficulty in walking, not elsewhere classified: Secondary | ICD-10-CM | POA: Diagnosis present

## 2016-06-11 DIAGNOSIS — G8929 Other chronic pain: Secondary | ICD-10-CM | POA: Insufficient documentation

## 2016-06-11 NOTE — Therapy (Signed)
[facility] [address] [place], [place], [address] Phone: [phone]   Fax:  [phone]  Physical Therapy Evaluation  Patient Details  Name: Danielle KehrKarma V Fralix MRN: [id number] Date of Birth: 05/26/1929 Referring Provider: Melvenia NeedlesShali Deveshwar  Encounter Date: 06/11/2016      PT End of Session - 06/11/16 1050    Visit Number 1   Date for PT Re-Evaluation 08/12/16   PT Start Time 1010   PT Stop Time 1055   PT Time Calculation (min) 45 min   Activity Tolerance Patient tolerated treatment well   Behavior [facility] Therapy [facility] for tasks assessed/performed      Past Medical History:  Diagnosis Date  . Callus   . Cataract fragments in both eyes following surgery   . Corns and callosities   . Hx of hysterectomy   . Hypertension     Past Surgical History:  Procedure Laterality Date  . ABDOMINAL HYSTERECTOMY    . NASAL SEPTUM SURGERY    . ROTATOR CUFF REPAIR Right     There were no vitals filed for this visit.       Subjective Assessment - 06/11/16 1014    Subjective Patient reports that she has had a few falls over the past 4 months.  She reports she feels that she just lost her balance, She reports some pain and weakness in her knees and shoulders, she has a history of shoulder replacements.  She has OA of the knees.   Pertinent History replaced right shoulder 2013, left shoulder RC repair   Limitations Walking;House hold activities   Patient Stated Goals walk better, have no falls   Currently in Pain? Yes   Pain Score 4    Pain Location Knee   Pain Orientation Right;Left   Pain Descriptors / Indicators Aching   Pain Type Chronic pain   Pain Onset More than a month ago   Pain Frequency Constant   Aggravating Factors  walking, the pain can be up to 7/10   Pain Relieving Factors Tylenol and rest will help pain down to 2/10   Effect of Pain on Daily Activities limits walking and standing            [facility]  PT Assessment - 06/11/16 0001      Assessment   Medical Diagnosis bilateral knee pain, difficulty walking   Referring Provider Shali Deveshwar   Onset Date/Surgical Date 05/12/16   Prior Therapy for shoulder     Precautions   Precautions None     Balance Screen   Has the patient fallen in the past 6 months Yes   How many times? 2   Has the patient had a decrease in activity level because of a fear of falling?  No   Is the patient reluctant to leave their home because of a fear of falling?  No     Home Environment   Additional Comments has stairs, she goes up one at a time, she has changed how she does this in the past year     Prior Function   Level of Independence Independent   Vocation Retired   Leisure walks sometimes     ROM / Strength   AROM / PROM / Strength AROM;Strength     AROM   AROM Assessment Site Knee   Right/Left Knee Right;Left   Right Knee Extension 10   Right Knee Flexion 92   Left Knee Extension 5   Left Knee  Flexion 100     Strength   Overall Strength Comments hips 4-/5, knees 3+/5, shoulders 3+/5 in the available ROM     Flexibility   Soft Tissue Assessment /Muscle Length --  very tight calves and HS     Palpation   Palpation comment some swelling in the lower legs, mild tenderness in the lateral thighs and calves     Ambulation/Gait   Gait Comments no device, slow gait, significant antalgic gait on the right, tends to have some trunk extension as she walks     Standardized Balance Assessment   Standardized Balance Assessment Timed Up and Go Test;Berg Balance Test     Berg Balance Test   Sit to Stand Able to stand  independently using hands   Standing Unsupported Able to stand safely 2 minutes   Sitting with Back Unsupported but Feet Supported on Floor or Stool Able to sit safely and securely 2 minutes   Stand to Sit Sits safely with minimal use of hands   Transfers Able to transfer safely, definite need of hands   Standing Unsupported  with Eyes Closed Able to stand 10 seconds safely   Standing Ubsupported with Feet Together Able to place feet together independently and stand 1 minute safely   From Standing, Reach Forward with Outstretched Arm Can reach confidently >25 cm (10")   From Standing Position, Pick up Object from Floor Able to pick up shoe safely and easily   From Standing Position, Turn to Look Behind Over each Shoulder Turn sideways only but maintains balance   Turn 360 Degrees Able to turn 360 degrees safely but slowly   Standing Unsupported, Alternately Place Feet on Step/Stool Able to stand independently and safely and complete 8 steps in 20 seconds   Standing Unsupported, One Foot in Front Able to take small step independently and hold 30 seconds   Standing on One Leg Tries to lift leg/unable to hold 3 seconds but remains standing independently   Total Score 45     Timed Up and Go Test   Normal TUG (seconds) 21                   [facility] Adult PT Treatment/Exercise - 06/11/16 0001      Exercises   Exercises Knee/Hip     Knee/Hip Exercises: Aerobic   Nustep level 4 x 6 minutes     Knee/Hip Exercises: Seated   Long Arc Quad 2 sets;10 reps;Both   Long Arc Quad Weight 2 lbs.   Marching Both;2 sets;10 reps   Marching Limitations 2                  PT Short Term Goals - 06/11/16 1056      PT SHORT TERM GOAL #1   Title independent with initial HEP   Time 2   Period Weeks   Status New           PT Long Term Goals - 06/11/16 1057      PT LONG TERM GOAL #1   Title decrease TUG time to 15 seconds   Time 8   Period Weeks   Status New     PT LONG TERM GOAL #2   Title increase Berg blance score to 49/56   Time 8   Period Weeks   Status New     PT LONG TERM GOAL #3   Title report pain 50% less with cooking   Time 8   Period Weeks  Status New     PT LONG TERM GOAL #4   Title be able to go up and down stairs step over step   Time 8   Period Weeks   Status New      PT LONG TERM GOAL #5   Title increase AROM of the knees to 5-110 degrees flexion   Time 8   Period Weeks   Status New               Plan - 06/11/16 1050    Clinical Impression Statement Patient reports that she has had a few falls over the past few months, she reports some bilateral knee pain and weakness due to OA.  She reports that she also has weakness in the UE's, she had some limitations with knee ROM, and her TUG time was 20 seconds and her Berg balance test was 45/56, both putting her at a higher risk for falls.   Rehab Potential Good   PT Frequency 2x / week   PT Duration 8 weeks   PT Treatment/Interventions ADLs/Self Care Home Management;Electrical Stimulation;Cryotherapy;Moist Heat;Ultrasound;Gait training;Stair training;Functional mobility training;Patient/family education;Neuromuscular re-education;Balance training;Therapeutic exercise;Therapeutic activities;Manual techniques   PT Next Visit Plan slowly add exercises for LE strenght and work on balance   Consulted and Agree with Plan of Care Patient      Patient will benefit from skilled therapeutic intervention in order to improve the following deficits and impairments:  Decreased activity tolerance, Decreased balance, Decreased mobility, Decreased strength, Decreased endurance, Decreased range of motion, Difficulty walking, Increased edema, Pain, Impaired flexibility  Visit Diagnosis: Chronic pain of left knee - Plan: PT plan of care cert/re-cert  Chronic pain of right knee - Plan: PT plan of care cert/re-cert  Difficulty in walking, not elsewhere classified - Plan: PT plan of care cert/re-cert  Repeated falls - Plan: PT plan of care cert/re-cert  Muscle weakness (generalized) - Plan: PT plan of care cert/re-cert      G-Codes - 06/11/16 1059    Functional Assessment Tool Used foto 67% limitation   Functional Limitation Mobility: Walking and moving around   Mobility: Walking and Moving Around Current Status  [phone]([id number]) At least 60 percent but less than 80 percent impaired, limited or restricted   Mobility: Walking and Moving Around Goal Status [phone]([id number]) At least 40 percent but less than 60 percent impaired, limited or restricted       Problem List Patient Active Problem List   Diagnosis Date Noted  . Chronic pain of both knees 05/20/2016  . Primary osteoarthritis of both hands 05/20/2016  . H/O total shoulder replacement, right 05/20/2016  . Essential hypertension 05/20/2016  . Environmental allergies 05/20/2016  . Bilateral primary osteoarthritis of knee 05/19/2016  . Callus of foot 11/24/2012  . Pain in joint, ankle and foot 09/15/2012    Jearld LeschALBRIGHT,Brandis Wixted W., PT 06/11/2016, 11:04 AM  [facility] [address] [place], [place], [address] Phone: [phone]   Fax:  [phone]  Name: Danielle KehrKarma V Dean MRN: [id number] Date of Birth: 11/06/1928

## 2016-06-13 ENCOUNTER — Ambulatory Visit: Payer: Medicare Other | Admitting: Physical Therapy

## 2016-06-13 ENCOUNTER — Encounter: Payer: Self-pay | Admitting: Physical Therapy

## 2016-06-13 DIAGNOSIS — M6281 Muscle weakness (generalized): Secondary | ICD-10-CM

## 2016-06-13 DIAGNOSIS — M25562 Pain in left knee: Secondary | ICD-10-CM | POA: Diagnosis not present

## 2016-06-13 DIAGNOSIS — R262 Difficulty in walking, not elsewhere classified: Secondary | ICD-10-CM

## 2016-06-13 DIAGNOSIS — M25561 Pain in right knee: Secondary | ICD-10-CM

## 2016-06-13 DIAGNOSIS — G8929 Other chronic pain: Secondary | ICD-10-CM

## 2016-06-13 DIAGNOSIS — R29898 Other symptoms and signs involving the musculoskeletal system: Secondary | ICD-10-CM

## 2016-06-13 NOTE — Therapy (Signed)
[facility] [address] [place], [place], [address] Phone: [phone]   Fax:  [phone]  Physical Therapy Treatment  Patient Details  Name: Danielle KehrKarma V Crespo MRN: [id number] Date of Birth: 02/02/1929 Referring Provider: Melvenia NeedlesShali Deveshwar  Encounter Date: 06/13/2016      PT End of Session - 06/13/16 1129    Visit Number 2   Date for PT Re-Evaluation 08/12/16   PT Start Time 1100   PT Stop Time 1130   PT Time Calculation (min) 30 min      Past Medical History:  Diagnosis Date  . Callus   . Cataract fragments in both eyes following surgery   . Corns and callosities   . Hx of hysterectomy   . Hypertension     Past Surgical History:  Procedure Laterality Date  . ABDOMINAL HYSTERECTOMY    . NASAL SEPTUM SURGERY    . ROTATOR CUFF REPAIR Right     There were no vitals filed for this visit.      Subjective Assessment - 06/13/16 1106    Subjective need strengthening in legs but arms too   Currently in Pain? Yes   Pain Score 2    Pain Location Knee   Pain Orientation Right;Left                         [facility] Adult PT Treatment/Exercise - 06/13/16 0001      Knee/Hip Exercises: Aerobic   Nustep level 4 x 6 minutes   Other Aerobic UBE 2 fwd /2 back L 2     Knee/Hip Exercises: Machines for Strengthening   Other Machine lat pull and seated row 10 # 2 sets 10     Knee/Hip Exercises: Seated   Long Arc Quad Strengthening;Both;1 set;15 reps  red tband   Marching Strengthening;Both;1 set;15 reps  red tband   Hamstring Curl Both;1 set;15 reps  red tband   Abduction/Adduction  Both;2 sets;15 reps  red tband                  PT Short Term Goals - 06/11/16 1056      PT SHORT TERM GOAL #1   Title independent with initial HEP   Time 2   Period Weeks   Status New           PT Long Term Goals - 06/11/16 1057      PT LONG TERM GOAL #1   Title decrease TUG time to 15  seconds   Time 8   Period Weeks   Status New     PT LONG TERM GOAL #2   Title increase Berg blance score to 49/56   Time 8   Period Weeks   Status New     PT LONG TERM GOAL #3   Title report pain 50% less with cooking   Time 8   Period Weeks   Status New     PT LONG TERM GOAL #4   Title be able to go up and down stairs step over step   Time 8   Period Weeks   Status New     PT LONG TERM GOAL #5   Title increase AROM of the knees to 5-110 degrees flexion   Time 8   Period Weeks   Status New               Plan - 06/13/16 1129    Clinical  Impression Statement pt reports she fells she needs total body ex and after 30 min felt it was enough for 1st visit, pt tolerated all ex well ( some cuing for tech needed). Pt declined need for modalites after ex.   PT Next Visit Plan assess how pt felt after ther ex today and progress as tolerated      Patient will benefit from skilled therapeutic intervention in order to improve the following deficits and impairments:  Decreased activity tolerance, Decreased balance, Decreased mobility, Decreased strength, Decreased endurance, Decreased range of motion, Difficulty walking, Increased edema, Pain, Impaired flexibility  Visit Diagnosis: Chronic pain of left knee  Chronic pain of right knee  Difficulty in walking, not elsewhere classified  Muscle weakness (generalized)  Weakness of shoulder     Problem List Patient Active Problem List   Diagnosis Date Noted  . Chronic pain of both knees 05/20/2016  . Primary osteoarthritis of both hands 05/20/2016  . H/O total shoulder replacement, right 05/20/2016  . Essential hypertension 05/20/2016  . Environmental allergies 05/20/2016  . Bilateral primary osteoarthritis of knee 05/19/2016  . Callus of foot 11/24/2012  . Pain in joint, ankle and foot 09/15/2012    Danajah Birdsell,ANGIE  PTA 06/13/2016, 11:31 AM  [facility] [address] [place], [place], [address] Phone: [phone]   Fax:  [phone]  Name: Danielle KehrKarma V Dean MRN: [id number] Date of Birth: 02/20/1929

## 2016-06-18 ENCOUNTER — Ambulatory Visit: Payer: Medicare Other | Admitting: Rheumatology

## 2016-06-18 ENCOUNTER — Encounter: Payer: Self-pay | Admitting: Physical Therapy

## 2016-06-18 ENCOUNTER — Ambulatory Visit: Payer: Medicare Other | Attending: Rheumatology | Admitting: Physical Therapy

## 2016-06-18 DIAGNOSIS — R296 Repeated falls: Secondary | ICD-10-CM | POA: Insufficient documentation

## 2016-06-18 DIAGNOSIS — R29898 Other symptoms and signs involving the musculoskeletal system: Secondary | ICD-10-CM | POA: Insufficient documentation

## 2016-06-18 DIAGNOSIS — M25611 Stiffness of right shoulder, not elsewhere classified: Secondary | ICD-10-CM | POA: Insufficient documentation

## 2016-06-18 DIAGNOSIS — M25561 Pain in right knee: Secondary | ICD-10-CM | POA: Diagnosis present

## 2016-06-18 DIAGNOSIS — M25562 Pain in left knee: Secondary | ICD-10-CM | POA: Diagnosis present

## 2016-06-18 DIAGNOSIS — R262 Difficulty in walking, not elsewhere classified: Secondary | ICD-10-CM | POA: Insufficient documentation

## 2016-06-18 DIAGNOSIS — M6281 Muscle weakness (generalized): Secondary | ICD-10-CM

## 2016-06-18 DIAGNOSIS — G8929 Other chronic pain: Secondary | ICD-10-CM

## 2016-06-18 NOTE — Therapy (Signed)
[facility] [place] [address] [place], [name], [address] Phone: [phone]   Fax:  [phone]  Physical Therapy Treatment  Patient Details  Name: Danielle Dean MRN: [id number] Date of Birth: 04-05-1929 Referring Provider: Melvenia Needles  Encounter Date: 06/18/2016      PT End of Session - 06/18/16 1101    Visit Number 3   Date for PT Re-Evaluation 08/12/16   PT Start Time 0930   PT Stop Time 1010   PT Time Calculation (min) 40 min   Activity Tolerance Patient tolerated treatment well   Behavior During Therapy [facility] for tasks assessed/performed      Past Medical History:  Diagnosis Date  . Callus   . Cataract fragments in both eyes following surgery   . Corns and callosities   . Hx of hysterectomy   . Hypertension     Past Surgical History:  Procedure Laterality Date  . ABDOMINAL HYSTERECTOMY    . NASAL SEPTUM SURGERY    . ROTATOR CUFF REPAIR Right     There were no vitals filed for this visit.      Subjective Assessment - 06/18/16 0933    Subjective Just weak, a little sore in my knees   Currently in Pain? Yes   Pain Score 3    Pain Location Knee   Pain Orientation Right;Left   Aggravating Factors  walking   Pain Relieving Factors rest                         [facility] Adult PT Treatment/Exercise - 06/18/16 0001      Knee/Hip Exercises: Aerobic   Nustep level 4 x 6 minutes   Other Aerobic UBE 3 fwd /3 back L 2     Knee/Hip Exercises: Machines for Strengthening   Other Machine lat pull and seated row 10 # 2 sets 10     Knee/Hip Exercises: Standing   Other Standing Knee Exercises seated trunk rotation with blue tband, weighted ball lift to head height     Knee/Hip Exercises: Seated   Long Arc Quad Strengthening;Both;15 reps;2 sets   [employer] Weight 3 lbs.   Ball Squeeze 20 reps   Marching Both;2 sets;10 reps   Marching Limitations 2   Hamstring Curl Both;2 sets;10 reps     Hamstring Weights 2 lbs.   Abduction/Adduction  Both;2 sets;10 reps   Abd/Adduction Weights 2 lbs.                  PT Short Term Goals - 06/11/16 1056      PT SHORT TERM GOAL #1   Title independent with initial HEP   Time 2   Period Weeks   Status New           PT Long Term Goals - 06/11/16 1057      PT LONG TERM GOAL #1   Title decrease TUG time to 15 seconds   Time 8   Period Weeks   Status New     PT LONG TERM GOAL #2   Title increase Berg blance score to 49/56   Time 8   Period Weeks   Status New     PT LONG TERM GOAL #3   Title report pain 50% less with cooking   Time 8   Period Weeks   Status New     PT LONG TERM GOAL #4   Title be able to  go up and down stairs step over step   Time 8   Period Weeks   Status New     PT LONG TERM GOAL #5   Title increase AROM of the knees to 5-110 degrees flexion   Time 8   Period Weeks   Status New               Plan - 06/18/16 1101    Clinical Impression Statement Patient with some difficulty with arm exercises due to weakness and some pain.  She did well with all other and tolerated longer duration exercises.  Still very weak and needing cues for exercises   PT Next Visit Plan Continue to slowly progress   Consulted and Agree with Plan of Care Patient      Patient will benefit from skilled therapeutic intervention in order to improve the following deficits and impairments:  Decreased activity tolerance, Decreased balance, Decreased mobility, Decreased strength, Decreased endurance, Decreased range of motion, Difficulty walking, Increased edema, Pain, Impaired flexibility  Visit Diagnosis: Chronic pain of left knee  Chronic pain of right knee  Difficulty in walking, not elsewhere classified  Muscle weakness (generalized)     Problem List Patient Active Problem List   Diagnosis Date Noted  . Chronic pain of both knees 05/20/2016  . Primary osteoarthritis of both hands 05/20/2016   . H/O total shoulder replacement, right 05/20/2016  . Essential hypertension 05/20/2016  . Environmental allergies 05/20/2016  . Bilateral primary osteoarthritis of knee 05/19/2016  . Callus of foot 11/24/2012  . Pain in joint, ankle and foot 09/15/2012    Jearld LeschALBRIGHT,Tami Blass W., PT 06/18/2016, 11:05 AM  [facility] [place] [address] [place], [place], [address] Phone: [phone]   Fax:  [phone]  Name: Danielle Dean MRN: [id number] Date of Birth: 05/15/1929

## 2016-06-20 ENCOUNTER — Ambulatory Visit: Payer: Medicare Other | Admitting: Physical Therapy

## 2016-06-20 ENCOUNTER — Encounter: Payer: Self-pay | Admitting: Physical Therapy

## 2016-06-20 DIAGNOSIS — M25562 Pain in left knee: Principal | ICD-10-CM

## 2016-06-20 DIAGNOSIS — R262 Difficulty in walking, not elsewhere classified: Secondary | ICD-10-CM

## 2016-06-20 DIAGNOSIS — M6281 Muscle weakness (generalized): Secondary | ICD-10-CM

## 2016-06-20 DIAGNOSIS — R296 Repeated falls: Secondary | ICD-10-CM

## 2016-06-20 DIAGNOSIS — R29898 Other symptoms and signs involving the musculoskeletal system: Secondary | ICD-10-CM

## 2016-06-20 DIAGNOSIS — G8929 Other chronic pain: Secondary | ICD-10-CM

## 2016-06-20 DIAGNOSIS — M25561 Pain in right knee: Secondary | ICD-10-CM

## 2016-06-20 NOTE — Therapy (Signed)
[place], [place], [address] Phone: [phone]   Fax:  [phone]  Physical Therapy Treatment  Patient Details  Name: Danielle Dean MRN: [id number] Date of Birth: Aug 30, 1928 Referring Provider: Cy Blamer  Encounter Date: 06/20/2016      PT End of Session - 06/20/16 1003    Visit Number 4   Date for PT Re-Evaluation 08/12/16   PT Start Time 0930   PT Stop Time 1012   PT Time Calculation (min) 42 min      Past Medical History:  Diagnosis Date  . Callus   . Cataract fragments in both eyes following surgery   . Corns and callosities   . Hx of hysterectomy   . Hypertension     Past Surgical History:  Procedure Laterality Date  . ABDOMINAL HYSTERECTOMY    . NASAL SEPTUM SURGERY    . ROTATOR CUFF REPAIR Right     There were no vitals filed for this visit.      Subjective Assessment - 06/20/16 0933    Subjective doing okay, achey with cold weather   Currently in Pain? Yes   Pain Score 3    Pain Location Knee            [facility] PT Assessment - 06/20/16 0001      AROM   Right Knee Extension 8   Left Knee Flexion 110                     [facility] Adult PT Treatment/Exercise - 06/20/16 0001      Knee/Hip Exercises: Aerobic   Nustep L 5 6 min   Other Aerobic UBE 3 fwd /3 back L 2     Knee/Hip Exercises: Machines for Strengthening   Cybex Knee Extension --  attempted but too heavy on machine   Cybex Knee Flexion 2 sets 10 10#   Other Machine lat pull and seated row 10 # 2 sets 10  15 # too heavy     Knee/Hip Exercises: Seated   Long Arc Quad Strengthening;Both;3 sets;10 reps;Weights   Long Arc Quad Weight 3 lbs.   Ball Squeeze 20 reps   Marching Strengthening;Both;2 sets;15 reps;Weights   Marching Limitations 3   Sit to [employer] 15 reps;without UE support  wt ball                  PT Short Term Goals - 06/20/16 0956      PT SHORT TERM  GOAL #1   Title independent with initial HEP   Status Achieved           PT Long Term Goals - 06/20/16 0956      PT LONG TERM GOAL #1   Title decrease TUG time to 15 seconds   Baseline TUG 11 sec   Status Achieved     PT LONG TERM GOAL #2   Title increase Berg blance score to 49/56   Status On-going     PT LONG TERM GOAL #3   Title report pain 50% less with cooking   Status On-going     PT LONG TERM GOAL #4   Title be able to go up and down stairs step over step   Status On-going     PT LONG TERM GOAL #5   Title increase AROM of the knees to 5-110 degrees flexion   Status Partially Met  Plan - 06/20/16 1003    Clinical Impression Statement STG met - HEP( doing ex from previous sessions). TUG goal met. Pt with improved knee ROM. Pain and weaknes with increased resistance attempted today.    PT Next Visit Plan Continue to slowly progress with strength and add BALANCE      Patient will benefit from skilled therapeutic intervention in order to improve the following deficits and impairments:  Decreased activity tolerance, Decreased balance, Decreased mobility, Decreased strength, Decreased endurance, Decreased range of motion, Difficulty walking, Increased edema, Pain, Impaired flexibility  Visit Diagnosis: Chronic pain of left knee  Chronic pain of right knee  Difficulty in walking, not elsewhere classified  Muscle weakness (generalized)  Weakness of shoulder  Repeated falls     Problem List Patient Active Problem List   Diagnosis Date Noted  . Chronic pain of both knees 05/20/2016  . Primary osteoarthritis of both hands 05/20/2016  . H/O total shoulder replacement, right 05/20/2016  . Essential hypertension 05/20/2016  . Environmental allergies 05/20/2016  . Bilateral primary osteoarthritis of knee 05/19/2016  . Callus of foot 11/24/2012  . Pain in joint, ankle and foot 09/15/2012    Danielle Dean,ANGIE PTA 06/20/2016, 10:07 AM  [place], [place], [address] Phone: [phone]   Fax:  [phone]  Name: Danielle Dean MRN: [id number] Date of Birth: 1929-04-24

## 2016-06-25 ENCOUNTER — Ambulatory Visit: Payer: Medicare Other | Admitting: Physical Therapy

## 2016-06-25 ENCOUNTER — Encounter: Payer: Self-pay | Admitting: Physical Therapy

## 2016-06-25 DIAGNOSIS — M6281 Muscle weakness (generalized): Secondary | ICD-10-CM

## 2016-06-25 DIAGNOSIS — M25562 Pain in left knee: Principal | ICD-10-CM

## 2016-06-25 DIAGNOSIS — R262 Difficulty in walking, not elsewhere classified: Secondary | ICD-10-CM

## 2016-06-25 DIAGNOSIS — M25561 Pain in right knee: Secondary | ICD-10-CM

## 2016-06-25 DIAGNOSIS — G8929 Other chronic pain: Secondary | ICD-10-CM

## 2016-06-25 NOTE — Therapy (Signed)
[place], [place], [address] Phone: [phone]   Fax:  [phone]  Physical Therapy Treatment  Patient Details  Name: Danielle Dean MRN: [id number] Date of Birth: Aug 16, 1928 Referring Provider: Cy Blamer  Encounter Date: 06/25/2016      PT End of Session - 06/25/16 1315    Visit Number 5   Date for PT Re-Evaluation 08/12/16   PT Start Time 1009   PT Stop Time 1055   PT Time Calculation (min) 46 min   Activity Tolerance Patient tolerated treatment well   Behavior During Therapy [facility] for tasks assessed/performed      Past Medical History:  Diagnosis Date  . Callus   . Cataract fragments in both eyes following surgery   . Corns and callosities   . Hx of hysterectomy   . Hypertension     Past Surgical History:  Procedure Laterality Date  . ABDOMINAL HYSTERECTOMY    . NASAL SEPTUM SURGERY    . ROTATOR CUFF REPAIR Right     There were no vitals filed for this visit.      Subjective Assessment - 06/25/16 1015    Subjective C/O pain and stiffness in the neck today   Currently in Pain? Yes   Pain Score 4    Pain Location Neck   Pain Orientation Right;Left   Pain Descriptors / Indicators Aching;Sore;Tightness   Aggravating Factors  turning head                         [facility] Adult PT Treatment/Exercise - 06/25/16 0001      Knee/Hip Exercises: Aerobic   Nustep L 5 6 min   Other Aerobic UBE 3 fwd /3 back L 4.5     Knee/Hip Exercises: Standing   Hip Abduction 2 sets;10 reps   Abduction Limitations 3#   Other Standing Knee Exercises seated trunk rotation with blue tband, weighted ball lift to head height   Other Standing Knee Exercises shoulder shrugs 2#, biceps 3#     Knee/Hip Exercises: Seated   Long Arc Quad Strengthening;Both;3 sets;10 reps;Weights   Long Arc Quad Weight 3 lbs.   Ball Squeeze 20 reps   Marching Strengthening;Both;2 sets;15  reps;Weights   Marching Limitations 3   Hamstring Curl Both;2 sets;10 reps    Hamstring Weights 3 lbs.   Abduction/Adduction  Both;2 sets;10 reps   Abd/Adduction Weights 3 lbs.     Knee/Hip Exercises: Supine   Other Supine Knee/Hip Exercises feet on ball K2C, trunk rotation, bridges and isometric abs   Other Supine Knee/Hip Exercises supine 1# chest press                  PT Short Term Goals - 06/20/16 0956      PT SHORT TERM GOAL #1   Title independent with initial HEP   Status Achieved           PT Long Term Goals - 06/20/16 0956      PT LONG TERM GOAL #1   Title decrease TUG time to 15 seconds   Baseline TUG 11 sec   Status Achieved     PT LONG TERM GOAL #2   Title increase Berg blance score to 49/56   Status On-going     PT LONG TERM GOAL #3   Title report pain 50% less with cooking   Status On-going     PT LONG TERM  GOAL #4   Title be able to go up and down stairs step over step   Status On-going     PT LONG TERM GOAL #5   Title increase AROM of the knees to 5-110 degrees flexion   Status Partially Met               Plan - 06/25/16 1316    Clinical Impression Statement Patient is weak and faitgues easily.  Today she did not want to do as many arm exercises as she was having some increased neck pain   PT Next Visit Plan Continue to slowly progress with strength and add BALANCE   Consulted and Agree with Plan of Care Patient      Patient will benefit from skilled therapeutic intervention in order to improve the following deficits and impairments:  Decreased activity tolerance, Decreased balance, Decreased mobility, Decreased strength, Decreased endurance, Decreased range of motion, Difficulty walking, Increased edema, Pain, Impaired flexibility  Visit Diagnosis: Chronic pain of left knee  Chronic pain of right knee  Difficulty in walking, not elsewhere classified  Muscle weakness (generalized)     Problem List Patient Active  Problem List   Diagnosis Date Noted  . Chronic pain of both knees 05/20/2016  . Primary osteoarthritis of both hands 05/20/2016  . H/O total shoulder replacement, right 05/20/2016  . Essential hypertension 05/20/2016  . Environmental allergies 05/20/2016  . Bilateral primary osteoarthritis of knee 05/19/2016  . Callus of foot 11/24/2012  . Pain in joint, ankle and foot 09/15/2012    Sumner Boast., PT 06/25/2016, 1:17 PM  [place] [address] [place], [place], [address] Phone: [phone]   Fax:  [phone]  Name: Danielle Dean MRN: [id number] Date of Birth: 06-09-1929

## 2016-06-26 ENCOUNTER — Ambulatory Visit: Payer: Medicare Other | Admitting: Physical Therapy

## 2016-06-26 ENCOUNTER — Encounter: Payer: Self-pay | Admitting: Physical Therapy

## 2016-06-26 DIAGNOSIS — R262 Difficulty in walking, not elsewhere classified: Secondary | ICD-10-CM

## 2016-06-26 DIAGNOSIS — M25561 Pain in right knee: Secondary | ICD-10-CM

## 2016-06-26 DIAGNOSIS — M25562 Pain in left knee: Secondary | ICD-10-CM | POA: Diagnosis not present

## 2016-06-26 DIAGNOSIS — G8929 Other chronic pain: Secondary | ICD-10-CM

## 2016-06-26 DIAGNOSIS — M6281 Muscle weakness (generalized): Secondary | ICD-10-CM

## 2016-06-26 NOTE — Therapy (Signed)
[facility] [place] [address] [place], [name], [address] Phone: [phone]   Fax:  [phone]  Physical Therapy Treatment  Patient Details  Name: Danielle Dean MRN: [id number] Date of Birth: 1928/12/13 Referring Provider: Melvenia Needles  Encounter Date: 06/26/2016      PT End of Session - 06/26/16 1153    Visit Number 6   Date for PT Re-Evaluation 08/12/16   PT Start Time 0932   PT Stop Time 1013   PT Time Calculation (min) 41 min   Activity Tolerance Patient tolerated treatment well   Behavior During Therapy [facility] for tasks assessed/performed      Past Medical History:  Diagnosis Date  . Callus   . Cataract fragments in both eyes following surgery   . Corns and callosities   . Hx of hysterectomy   . Hypertension     Past Surgical History:  Procedure Laterality Date  . ABDOMINAL HYSTERECTOMY    . NASAL SEPTUM SURGERY    . ROTATOR CUFF REPAIR Right     There were no vitals filed for this visit.      Subjective Assessment - 06/26/16 0935    Subjective My neck is still really sore   Currently in Pain? Yes   Pain Score 4    Pain Location Neck            [facility] PT Assessment - 06/26/16 0001      AROM   Right Knee Extension 8   Right Knee Flexion 98   Left Knee Extension 5   Left Knee Flexion 110                     [facility] Adult PT Treatment/Exercise - 06/26/16 0001      Knee/Hip Exercises: Aerobic   Nustep L 5 6 min   Other Aerobic UBE 3 fwd /3 back L 4.5     Knee/Hip Exercises: Standing   Hip Abduction 2 sets;10 reps   Abduction Limitations 2#   Other Standing Knee Exercises shoulder shrugs 2#, biceps 3#     Knee/Hip Exercises: Seated   Long Arc Quad Strengthening;Both;3 sets;10 reps;Weights   Long Arc Quad Weight 3 lbs.   Marching Strengthening;Both;2 sets;15 reps;Weights   Marching Limitations 3   Hamstring Curl Both;2 sets;10 reps    Hamstring Weights 3 lbs.     Knee/Hip Exercises: Supine   Short Arc Quad Sets Both;Strengthening;2 sets;15 reps   Short Arc Quad Sets Limitations 2.5#   Other Supine Knee/Hip Exercises feet on ball K2C, trunk rotation, bridges and isometric abs   Other Supine Knee/Hip Exercises supine 1# chest press                  PT Short Term Goals - 06/20/16 0956      PT SHORT TERM GOAL #1   Title independent with initial HEP   Status Achieved           PT Long Term Goals - 06/26/16 1155      PT LONG TERM GOAL #3   Title report pain 50% less with cooking   Status On-going               Plan - 06/26/16 1154    Clinical Impression Statement Patient has some pain with activities and needs to have some exercises altered.  Lacks TKE and needs a lot of cues to get TKE   PT Next Visit Plan Continue  to slowly progress with strength and add BALANCE   Consulted and Agree with Plan of Care Patient      Patient will benefit from skilled therapeutic intervention in order to improve the following deficits and impairments:  Decreased activity tolerance, Decreased balance, Decreased mobility, Decreased strength, Decreased endurance, Decreased range of motion, Difficulty walking, Increased edema, Pain, Impaired flexibility  Visit Diagnosis: Chronic pain of left knee  Chronic pain of right knee  Difficulty in walking, not elsewhere classified  Muscle weakness (generalized)     Problem List Patient Active Problem List   Diagnosis Date Noted  . Chronic pain of both knees 05/20/2016  . Primary osteoarthritis of both hands 05/20/2016  . H/O total shoulder replacement, right 05/20/2016  . Essential hypertension 05/20/2016  . Environmental allergies 05/20/2016  . Bilateral primary osteoarthritis of knee 05/19/2016  . Callus of foot 11/24/2012  . Pain in joint, ankle and foot 09/15/2012    Jearld LeschALBRIGHT,MICHAEL W., PT 06/26/2016, 11:56 AM  [facility] [place] [address] [place], [place], [address] Phone: [phone]   Fax:  [phone]  Name: Danielle Dean MRN: [id number] Date of Birth: 03/16/1929

## 2016-07-01 ENCOUNTER — Encounter: Payer: Self-pay | Admitting: Physical Therapy

## 2016-07-01 ENCOUNTER — Ambulatory Visit: Payer: Medicare Other | Admitting: Physical Therapy

## 2016-07-01 DIAGNOSIS — R262 Difficulty in walking, not elsewhere classified: Secondary | ICD-10-CM

## 2016-07-01 DIAGNOSIS — M25562 Pain in left knee: Secondary | ICD-10-CM | POA: Diagnosis not present

## 2016-07-01 DIAGNOSIS — M6281 Muscle weakness (generalized): Secondary | ICD-10-CM

## 2016-07-01 NOTE — Therapy (Signed)
[facility] [facility] [address] [place], [place], [address] Phone: [phone]   Fax:  [phone]  Physical Therapy Treatment  Patient Details  Name: Danielle Dean MRN: [id number] Date of Birth: 08/27/1928 Referring Provider: Melvenia NeedlesShali Deveshwar  Encounter Date: 07/01/2016      PT End of Session - 07/01/16 1139    Visit Number 7   Date for PT Re-Evaluation 08/12/16   PT Start Time 1100   PT Stop Time 1140   PT Time Calculation (min) 40 min   Activity Tolerance Patient tolerated treatment well      Past Medical History:  Diagnosis Date  . Callus   . Cataract fragments in both eyes following surgery   . Corns and callosities   . Hx of hysterectomy   . Hypertension     Past Surgical History:  Procedure Laterality Date  . ABDOMINAL HYSTERECTOMY    . NASAL SEPTUM SURGERY    . ROTATOR CUFF REPAIR Right     There were no vitals filed for this visit.      Subjective Assessment - 07/01/16 1104    Subjective Feeling fine with no pain.                          [facility] Adult PT Treatment/Exercise - 07/01/16 0001      High Level Balance   High Level Balance Activities Side stepping  Plinth to plinth     Knee/Hip Exercises: Aerobic   Nustep lvl 5 6 min     Knee/Hip Exercises: Machines for Strengthening   Other Machine UBE 233fwd/3back  4.5     Knee/Hip Exercises: Standing   Knee Flexion AROM;Strengthening;Both;2 sets;10 reps   Knee Flexion Limitations 2# the first none the 2nd    Hip Abduction AROM;Stengthening;Both;2 sets;10 reps   Abduction Limitations 2#   Other Standing Knee Exercises Marchin x 1 min on airex twice    Other Standing Knee Exercises shoulder shrugs 2#, biceps 2#     Knee/Hip Exercises: Seated   Long Arc Quad Strengthening;AROM;Both;2 sets;10 reps   Long Arc Quad Weight 3 lbs.                  PT Short Term Goals - 06/20/16 0956      PT SHORT TERM GOAL #1   Title independent with initial HEP   Status Achieved           PT Long Term Goals - 06/26/16 1155      PT LONG TERM GOAL #3   Title report pain 50% less with cooking   Status On-going               Plan - 07/01/16 1140    Clinical Impression Statement Patient had trouble with the arm bike with right shoulder fatiguing. The airex activities fatigued her legs and cuff weight lbs had to be decreased along with needing mod. VC on proper technique. Patient stated her right knee and right shoulder were bothering her today.    Rehab Potential Good   PT Frequency 2x / week   PT Duration 8 weeks   PT Treatment/Interventions ADLs/Self Care Home Management;Electrical Stimulation;Cryotherapy;Moist Heat;Ultrasound;Gait training;Stair training;Functional mobility training;Patient/family education;Neuromuscular re-education;Balance training;Therapeutic exercise;Therapeutic activities;Manual techniques   PT Next Visit Plan Continue to slowly progress with strength and add BALANCE      Patient will benefit from skilled therapeutic intervention in order to improve the following deficits  and impairments:  Decreased activity tolerance, Decreased balance, Decreased mobility, Decreased strength, Decreased endurance, Decreased range of motion, Difficulty walking, Increased edema, Pain, Impaired flexibility  Visit Diagnosis: Muscle weakness (generalized)  Difficulty in walking, not elsewhere classified     Problem List Patient Active Problem List   Diagnosis Date Noted  . Chronic pain of both knees 05/20/2016  . Primary osteoarthritis of both hands 05/20/2016  . H/O total shoulder replacement, right 05/20/2016  . Essential hypertension 05/20/2016  . Environmental allergies 05/20/2016  . Bilateral primary osteoarthritis of knee 05/19/2016  . Callus of foot 11/24/2012  . Pain in joint, ankle and foot 09/15/2012    Phillips Odor, [place] 07/01/2016, 11:45 AM  [facility] [place] [address] [place], [name], [address] Phone: [phone]   Fax:  [phone]  Name: Danielle Dean MRN: [id number] Date of Birth: 1928-08-10

## 2016-07-04 ENCOUNTER — Encounter: Payer: Self-pay | Admitting: Physical Therapy

## 2016-07-04 ENCOUNTER — Ambulatory Visit: Payer: Medicare Other | Admitting: Physical Therapy

## 2016-07-04 DIAGNOSIS — M25562 Pain in left knee: Secondary | ICD-10-CM | POA: Diagnosis not present

## 2016-07-04 DIAGNOSIS — R296 Repeated falls: Secondary | ICD-10-CM

## 2016-07-04 DIAGNOSIS — M25561 Pain in right knee: Secondary | ICD-10-CM

## 2016-07-04 DIAGNOSIS — G8929 Other chronic pain: Secondary | ICD-10-CM

## 2016-07-04 DIAGNOSIS — R262 Difficulty in walking, not elsewhere classified: Secondary | ICD-10-CM

## 2016-07-04 DIAGNOSIS — M25611 Stiffness of right shoulder, not elsewhere classified: Secondary | ICD-10-CM

## 2016-07-04 DIAGNOSIS — M6281 Muscle weakness (generalized): Secondary | ICD-10-CM

## 2016-07-04 NOTE — Therapy (Cosign Needed)
[facility] [address] [place], [name], [address] Phone: [phone]   Fax:  [phone]  Physical Therapy Treatment  Patient Details  Name: Danielle Dean MRN: [id number] Date of Birth: [birth year] Referring Provider: Melvenia Needles  Encounter Date: 07/04/2016      PT End of Session - 07/04/16 1051    Visit Number 8   Date for PT Re-Evaluation 08/12/16   PT Start Time 1010   PT Stop Time 1045   PT Time Calculation (min) 35 min   Activity Tolerance Patient tolerated treatment well      Past Medical History:  Diagnosis Date  . Callus   . Cataract fragments in both eyes following surgery   . Corns and callosities   . Hx of hysterectomy   . Hypertension     Past Surgical History:  Procedure Laterality Date  . ABDOMINAL HYSTERECTOMY    . NASAL SEPTUM SURGERY    . ROTATOR CUFF REPAIR Right     There were no vitals filed for this visit.      Subjective Assessment - 07/04/16 1012    Subjective Patient is reporting pain in right arm and knee today and requested to go lighter on weights.    Currently in Pain? Yes   Pain Score 5    Pain Location Knee   Pain Orientation Right                         [facility] Adult PT Treatment/Exercise - 07/04/16 0001      High Level Balance   High Level Balance Activities Side stepping;Backward walking;Marching forwards;Marching backwards   High Level Balance Comments On Airex: SLS 3x30", Marching, bilat. shoulder flexion to 90 degrees, hip flexion and hip abd. bilaterally x 20 each     Knee/Hip Exercises: Aerobic   Nustep lvl 5 6 min   Other Aerobic UBE 3 fwd /3 back L 4.5                  PT Short Term Goals - 06/20/16 6962      PT SHORT TERM GOAL #1   Title independent with initial HEP   Status Achieved           PT Long Term Goals - 06/26/16 1155      PT LONG TERM GOAL #3   Title report pain 50% less with cooking   Status  On-going               Plan - 07/04/16 1052    Clinical Impression Statement Patient tolerated treatment well today with the focus on balance activties due to increased shoulder and knee pain. Patient mentioned her glutes were burning but at the end of the session had no increase in pain from the beginning of therapy. Patient needed mod. VC on correct technique and posture. When patient had to look up during exercises she didnt balance herself as good as when looking down at her feet.   Rehab Potential Good   PT Frequency 2x / week   PT Duration 8 weeks      Patient will benefit from skilled therapeutic intervention in order to improve the following deficits and impairments:  Decreased activity tolerance, Decreased balance, Decreased mobility, Decreased strength, Decreased endurance, Decreased range of motion, Difficulty walking, Increased edema, Pain, Impaired flexibility  Visit Diagnosis: Muscle weakness (generalized)  Difficulty in walking, not elsewhere classified  Repeated falls  Chronic pain of right knee  Decreased ROM of right shoulder     Problem List Patient Active Problem List   Diagnosis Date Noted  . Chronic pain of both knees 05/20/2016  . Primary osteoarthritis of both hands 05/20/2016  . H/O total shoulder replacement, right 05/20/2016  . Essential hypertension 05/20/2016  . Environmental allergies 05/20/2016  . Bilateral primary osteoarthritis of knee 05/19/2016  . Callus of foot 11/24/2012  . Pain in joint, ankle and foot 09/15/2012    Phillips OdorBrittany Obdulio Mash, [place] 07/04/2016, 10:57 AM  [facility] [address] [place], [place], [address] Phone: [phone]   Fax:  [phone]  Name: Danielle Dean MRN: [id number] Date of Birth: 11/13/1928

## 2016-07-04 NOTE — Therapy (Signed)
[facility] [address] [place], [name], [address] Phone: [phone]   Fax:  [phone]  Physical Therapy Treatment  Patient Details  Name: Danielle Dean MRN: [id number] Date of Birth: 1929/04/01 Referring Provider: Melvenia Needles  Encounter Date: 07/04/2016      PT End of Session - 07/04/16 1051    Visit Number 8   Date for PT Re-Evaluation 08/12/16   PT Start Time 1010   PT Stop Time 1045   PT Time Calculation (min) 35 min   Activity Tolerance Patient tolerated treatment well      Past Medical History:  Diagnosis Date  . Callus   . Cataract fragments in both eyes following surgery   . Corns and callosities   . Hx of hysterectomy   . Hypertension     Past Surgical History:  Procedure Laterality Date  . ABDOMINAL HYSTERECTOMY    . NASAL SEPTUM SURGERY    . ROTATOR CUFF REPAIR Right     There were no vitals filed for this visit.      Subjective Assessment - 07/04/16 1012    Subjective Patient is reporting pain in right arm and knee today and requested to go lighter on weights.    Currently in Pain? Yes   Pain Score 5    Pain Location Knee   Pain Orientation Right                         [facility] Adult PT Treatment/Exercise - 07/04/16 0001      High Level Balance   High Level Balance Activities Side stepping;Backward walking;Marching forwards;Marching backwards   High Level Balance Comments On Airex: SLS 3x30", Marching, bilat. shoulder flexion to 90 degrees, hip flexion and hip abd. bilaterally x 20 each     Knee/Hip Exercises: Aerobic   Nustep lvl 5 6 min   Other Aerobic UBE 3 fwd /3 back L 4.5                  PT Short Term Goals - 06/20/16 6962      PT SHORT TERM GOAL #1   Title independent with initial HEP   Status Achieved           PT Long Term Goals - 07/04/16 1059      PT LONG TERM GOAL #1   Title decrease TUG time to 15 seconds   Status  Achieved     PT LONG TERM GOAL #2   Title increase Berg blance score to 49/56   Status On-going     PT LONG TERM GOAL #3   Title report pain 50% less with cooking   Status On-going     PT LONG TERM GOAL #4   Title be able to go up and down stairs step over step   Status On-going     PT LONG TERM GOAL #5   Title increase AROM of the knees to 5-110 degrees flexion   Status On-going               Plan - 07/04/16 1052    Clinical Impression Statement Patient tolerated treatment well today with the focus on balance activties due to increased shoulder and knee pain. Patient mentioned her glutes were burning but at the end of the session had no increase in pain from the beginning of therapy. Patient needed mod. VC on correct technique and posture. When patient had to  look up during exercises she didnt balance herself as good as when looking down at her feet.   Rehab Potential Good   PT Frequency 2x / week   PT Duration 8 weeks      Patient will benefit from skilled therapeutic intervention in order to improve the following deficits and impairments:  Decreased activity tolerance, Decreased balance, Decreased mobility, Decreased strength, Decreased endurance, Decreased range of motion, Difficulty walking, Increased edema, Pain, Impaired flexibility  Visit Diagnosis: Muscle weakness (generalized)  Difficulty in walking, not elsewhere classified  Repeated falls  Chronic pain of right knee  Decreased ROM of right shoulder     Problem List Patient Active Problem List   Diagnosis Date Noted  . Chronic pain of both knees 05/20/2016  . Primary osteoarthritis of both hands 05/20/2016  . H/O total shoulder replacement, right 05/20/2016  . Essential hypertension 05/20/2016  . Environmental allergies 05/20/2016  . Bilateral primary osteoarthritis of knee 05/19/2016  . Callus of foot 11/24/2012  . Pain in joint, ankle and foot 09/15/2012    Idaly Verret,ANGIE PTA 07/04/2016,  11:00 AM  [facility] [address] [place], [place], [address] Phone: [phone]   Fax:  [phone]  Name: Danielle Dean MRN: [id number] Date of Birth: 12/11/1928

## 2016-07-08 ENCOUNTER — Ambulatory Visit: Payer: Medicare Other | Admitting: Physical Therapy

## 2016-07-08 ENCOUNTER — Encounter: Payer: Self-pay | Admitting: Physical Therapy

## 2016-07-08 DIAGNOSIS — G8929 Other chronic pain: Secondary | ICD-10-CM

## 2016-07-08 DIAGNOSIS — M25611 Stiffness of right shoulder, not elsewhere classified: Secondary | ICD-10-CM

## 2016-07-08 DIAGNOSIS — R29898 Other symptoms and signs involving the musculoskeletal system: Secondary | ICD-10-CM

## 2016-07-08 DIAGNOSIS — M6281 Muscle weakness (generalized): Secondary | ICD-10-CM

## 2016-07-08 DIAGNOSIS — M25562 Pain in left knee: Secondary | ICD-10-CM | POA: Diagnosis not present

## 2016-07-08 DIAGNOSIS — R262 Difficulty in walking, not elsewhere classified: Secondary | ICD-10-CM

## 2016-07-08 DIAGNOSIS — M25561 Pain in right knee: Secondary | ICD-10-CM

## 2016-07-08 NOTE — Therapy (Signed)
[facility] [address] [place], [name], [address] Phone: [phone]   Fax:  [phone]  Physical Therapy Treatment  Patient Details  Name: DAWNMARIE BREON MRN: [id number] Date of Birth: [birth year] Referring Provider: Melvenia Needles  Encounter Date: 07/08/2016      PT End of Session - 07/08/16 1147    Visit Number 9   Date for PT Re-Evaluation 08/12/16   PT Start Time 1100   PT Stop Time 1140   PT Time Calculation (min) 40 min   Activity Tolerance Patient tolerated treatment well   Behavior During Therapy [facility] for tasks assessed/performed      Past Medical History:  Diagnosis Date  . Callus   . Cataract fragments in both eyes following surgery   . Corns and callosities   . Hx of hysterectomy   . Hypertension     Past Surgical History:  Procedure Laterality Date  . ABDOMINAL HYSTERECTOMY    . NASAL SEPTUM SURGERY    . ROTATOR CUFF REPAIR Right     There were no vitals filed for this visit.      Subjective Assessment - 07/08/16 1106    Subjective Just right.    Pain Score 3    Pain Location Knee   Pain Orientation Right                         [facility] Adult PT Treatment/Exercise - 07/08/16 0001      High Level Balance   High Level Balance Comments Hip abd 2# x20 each side on airex, 2# marching on Airex 2x20 both, bicep curls 2# on airex 2x10, shoulder shrugs 2# x20 on airex, on blue pad hip abd and clams with red tband, Ham curls on airex 2# 2x10     Knee/Hip Exercises: Aerobic   Nustep lvl 5 7 min   Other Aerobic UBE 3 fwd /3 back L 4.5     Knee/Hip Exercises: Standing   Other Standing Knee Exercises sit to stand with partial squat hold for 3 secs 2x10                  PT Short Term Goals - 06/20/16 6962      PT SHORT TERM GOAL #1   Title independent with initial HEP   Status Achieved           PT Long Term Goals - 07/04/16 1059      PT LONG TERM  GOAL #1   Title decrease TUG time to 15 seconds   Status Achieved     PT LONG TERM GOAL #2   Title increase Berg blance score to 49/56   Status On-going     PT LONG TERM GOAL #3   Title report pain 50% less with cooking   Status On-going     PT LONG TERM GOAL #4   Title be able to go up and down stairs step over step   Status On-going     PT LONG TERM GOAL #5   Title increase AROM of the knees to 5-110 degrees flexion   Status On-going               Plan - 07/08/16 1148    Clinical Impression Statement Due to patient feeling "just right" she performed exercises well with no pain, but did feel fatigue at the end. She was willing to try lats exercise today in order to  help bring her right should up to full flexion but the weight was too light to pull the bar back up. Patient needed mod. VC on correct technique and posture during treatment.    Rehab Potential Good   PT Frequency 2x / week   PT Duration 8 weeks   PT Treatment/Interventions ADLs/Self Care Home Management;Electrical Stimulation;Cryotherapy;Moist Heat;Ultrasound;Gait training;Stair training;Functional mobility training;Patient/family education;Neuromuscular re-education;Balance training;Therapeutic exercise;Therapeutic activities;Manual techniques   PT Next Visit Plan Try AAROM with shoulder flexion and continue with strength and balance   Consulted and Agree with Plan of Care Patient      Patient will benefit from skilled therapeutic intervention in order to improve the following deficits and impairments:  Decreased activity tolerance, Decreased balance, Decreased mobility, Decreased strength, Decreased endurance, Decreased range of motion, Difficulty walking, Increased edema, Pain, Impaired flexibility  Visit Diagnosis: Muscle weakness (generalized)  Difficulty in walking, not elsewhere classified  Chronic pain of right knee  Decreased ROM of right shoulder  Weakness of shoulder     Problem  List Patient Active Problem List   Diagnosis Date Noted  . Chronic pain of both knees 05/20/2016  . Primary osteoarthritis of both hands 05/20/2016  . H/O total shoulder replacement, right 05/20/2016  . Essential hypertension 05/20/2016  . Environmental allergies 05/20/2016  . Bilateral primary osteoarthritis of knee 05/19/2016  . Callus of foot 11/24/2012  . Pain in joint, ankle and foot 09/15/2012    Phillips OdorBrittany [place] Willow, [place]A 07/08/2016, 12:03 PM  [facility] [address] [place], [place], [address] Phone: [phone]   Fax:  [phone]  Name: Walker KehrKarma V Steinhauser MRN: [id number] Date of Birth: 08/08/1928

## 2016-07-11 ENCOUNTER — Encounter: Payer: Self-pay | Admitting: Physical Therapy

## 2016-07-11 ENCOUNTER — Ambulatory Visit: Payer: Medicare Other | Admitting: Physical Therapy

## 2016-07-11 DIAGNOSIS — M25562 Pain in left knee: Secondary | ICD-10-CM | POA: Diagnosis not present

## 2016-07-11 DIAGNOSIS — M25611 Stiffness of right shoulder, not elsewhere classified: Secondary | ICD-10-CM

## 2016-07-11 DIAGNOSIS — M6281 Muscle weakness (generalized): Secondary | ICD-10-CM

## 2016-07-11 DIAGNOSIS — R29898 Other symptoms and signs involving the musculoskeletal system: Secondary | ICD-10-CM

## 2016-07-11 DIAGNOSIS — R262 Difficulty in walking, not elsewhere classified: Secondary | ICD-10-CM

## 2016-07-11 NOTE — Therapy (Signed)
[facility] [address] [place], [name], [address] Phone: [phone]   Fax:  [phone]  Physical Therapy Treatment  Patient Details  Name: Danielle Dean MRN: [id number] Date of Birth: 1929-04-19 Referring Provider: Melvenia Needles  Encounter Date: 07/11/2016      PT End of Session - 07/11/16 1207    Visit Number 10   Date for PT Re-Evaluation 08/12/16   PT Start Time 1045   PT Stop Time 1130   PT Time Calculation (min) 45 min   Activity Tolerance Patient tolerated treatment well   Behavior During Therapy [facility] for tasks assessed/performed      Past Medical History:  Diagnosis Date  . Callus   . Cataract fragments in both eyes following surgery   . Corns and callosities   . Hx of hysterectomy   . Hypertension     Past Surgical History:  Procedure Laterality Date  . ABDOMINAL HYSTERECTOMY    . NASAL SEPTUM SURGERY    . ROTATOR CUFF REPAIR Right     There were no vitals filed for this visit.      Subjective Assessment - 07/11/16 1201    Subjective Patient stated increased shoulder pain.   Currently in Pain? Yes   Pain Score 4    Pain Location Shoulder   Pain Orientation Right                         [facility] Adult PT Treatment/Exercise - 07/11/16 0001      Knee/Hip Exercises: Aerobic   Nustep lvl 5 7 min   Other Aerobic UBE 3 fwd /3 back L 4.5     Knee/Hip Exercises: Standing   Knee Flexion AROM;Strengthening;Both;2 sets;10 reps     Knee/Hip Exercises: Seated   Clamshell with TheraBand Red  2x10   Hamstring Curl AROM;Strengthening;Both;2 sets;10 reps   Abduction/Adduction  AROM;Strengthening;Both;2 sets;10 reps  red tband   Sit to Sand 2 sets;10 reps;without UE support  on airex and sitting on blue pad     Shoulder Exercises: Standing   Extension AROM;Strengthening;Both;10 reps;20 reps;Theraband  red tband   Row AROM;Strengthening;Both;20 reps;Theraband  red     Shoulder Exercises: Stretch   Wall Stretch - Flexion 5 reps;10 seconds  with green ball                  PT Short Term Goals - 06/20/16 0956      PT SHORT TERM GOAL #1   Title independent with initial HEP   Status Achieved           PT Long Term Goals - 07/11/16 1210      PT LONG TERM GOAL #1   Title decrease TUG time to 15 seconds   Status On-going     PT LONG TERM GOAL #2   Title increase Berg blance score to 49/56   Status On-going     PT LONG TERM GOAL #3   Title report pain 50% less with cooking   Status On-going     PT LONG TERM GOAL #4   Title be able to go up and down stairs step over step   Status On-going     PT LONG TERM GOAL #5   Title increase AROM of the knees to 5-110 degrees flexion   Status On-going               Plan - 07/11/16 1207  Clinical Impression Statement Patient tolerated treatment well but came in with more than normal right shoulder pain, so the focus today was on LE strengthening, some balance, and low level UE strengthening. Patient needed mod. VC on correct posture and technique. Patient recieved increased pain with ball on wall stretches at end range, but expressed it was range she would like to earn.   Rehab Potential Good   PT Frequency 2x / week   PT Duration 8 weeks   PT Treatment/Interventions ADLs/Self Care Home Management;Electrical Stimulation;Cryotherapy;Moist Heat;Ultrasound;Gait training;Stair training;Functional mobility training;Patient/family education;Neuromuscular re-education;Balance training;Therapeutic exercise;Therapeutic activities;Manual techniques   PT Next Visit Plan Assess goals.      Patient will benefit from skilled therapeutic intervention in order to improve the following deficits and impairments:  Decreased activity tolerance, Decreased balance, Decreased mobility, Decreased strength, Decreased endurance, Decreased range of motion, Difficulty walking, Increased edema, Pain, Impaired  flexibility  Visit Diagnosis: Muscle weakness (generalized)  Difficulty in walking, not elsewhere classified  Decreased ROM of right shoulder  Weakness of shoulder     Problem List Patient Active Problem List   Diagnosis Date Noted  . Chronic pain of both knees 05/20/2016  . Primary osteoarthritis of both hands 05/20/2016  . H/O total shoulder replacement, right 05/20/2016  . Essential hypertension 05/20/2016  . Environmental allergies 05/20/2016  . Bilateral primary osteoarthritis of knee 05/19/2016  . Callus of foot 11/24/2012  . Pain in joint, ankle and foot 09/15/2012    Phillips OdorBrittany Makari Sanko, [place]A  07/11/2016, 12:12 PM  [facility] [address] [place], [place], [address] Phone: [phone]   Fax:  [phone]  Name: Danielle Dean MRN: [id number] Date of Birth: 09/30/1928

## 2016-07-15 ENCOUNTER — Encounter: Payer: Self-pay | Admitting: Physical Therapy

## 2016-07-15 ENCOUNTER — Ambulatory Visit: Payer: Medicare Other | Admitting: Physical Therapy

## 2016-07-15 DIAGNOSIS — M6281 Muscle weakness (generalized): Secondary | ICD-10-CM

## 2016-07-15 DIAGNOSIS — R262 Difficulty in walking, not elsewhere classified: Secondary | ICD-10-CM

## 2016-07-15 DIAGNOSIS — M25562 Pain in left knee: Secondary | ICD-10-CM | POA: Diagnosis not present

## 2016-07-15 DIAGNOSIS — M25611 Stiffness of right shoulder, not elsewhere classified: Secondary | ICD-10-CM

## 2016-07-15 NOTE — Therapy (Signed)
[place], [place], [address] Phone: [phone]   Fax:  [phone]  Physical Therapy Treatment  Patient Details  Name: TALYN EDDIE MRN: [id number] Date of Birth: 05-14-1929 Referring Provider: Cy Blamer  Encounter Date: 07/15/2016      PT End of Session - 07/15/16 1025    Visit Number 11   Date for PT Re-Evaluation 08/12/16   PT Start Time 0934   PT Stop Time 1015   PT Time Calculation (min) 41 min   Activity Tolerance Patient tolerated treatment well   Behavior During Therapy [facility] for tasks assessed/performed      Past Medical History:  Diagnosis Date  . Callus   . Cataract fragments in both eyes following surgery   . Corns and callosities   . Hx of hysterectomy   . Hypertension     Past Surgical History:  Procedure Laterality Date  . ABDOMINAL HYSTERECTOMY    . NASAL SEPTUM SURGERY    . ROTATOR CUFF REPAIR Right     There were no vitals filed for this visit.      Subjective Assessment - 07/15/16 0936    Subjective REports no pain today, just feels stiff   Currently in Pain? No/denies                         [facility] Adult PT Treatment/Exercise - 07/15/16 0001      High Level Balance   High Level Balance Activities Tandem walking   High Level Balance Comments resisted gait all directions, toe walks, heel walks     Knee/Hip Exercises: Aerobic   Nustep lvl 5 6 min   Other Aerobic UBE 3 fwd /3 back L 4.5     Knee/Hip Exercises: Machines for Strengthening   Cybex Knee Extension 5# x 5 reps with some assistance, "too heavy"   Cybex Knee Flexion 2 sets 10 10#     Knee/Hip Exercises: Standing   Hip Flexion Stengthening;Both;2 sets;10 reps   Hip Flexion Limitations 3# right, 2# left   Hip Abduction AROM;Stengthening;Both;2 sets;10 reps   Abduction Limitations 3# right  and 2# left     Knee/Hip Exercises: Seated   Long Arc Quad  Strengthening;AROM;Both;2 sets;10 reps   Long Arc Quad Limitations 3# on right 2# on left     Shoulder Exercises: ROM/Strengthening   Cybex Row 1 plate;20 reps   Other ROM/Strengthening Exercises lat pulls 10# 2x10                  PT Short Term Goals - 06/20/16 0956      PT SHORT TERM GOAL #1   Title independent with initial HEP   Status Achieved           PT Long Term Goals - 07/15/16 1055      PT LONG TERM GOAL #1   Title decrease TUG time to 15 seconds   Status On-going     PT LONG TERM GOAL #2   Title increase Berg blance score to 49/56   Status On-going     PT LONG TERM GOAL #3   Title report pain 50% less with cooking   Status Partially Met     PT LONG TERM GOAL #4   Title be able to go up and down stairs step over step   Status Partially Met  Plan - 07/15/16 1053    Clinical Impression Statement The left knee and LE is a little weaker, had some difficulty with knee extension, had difficulty with toe raises due to left pinkie toe pain, also had difficulty with balance on tandem walk   PT Next Visit Plan continue to work on functional strength   Consulted and Agree with Plan of Care Patient      Patient will benefit from skilled therapeutic intervention in order to improve the following deficits and impairments:  Decreased activity tolerance, Decreased balance, Decreased mobility, Decreased strength, Decreased endurance, Decreased range of motion, Difficulty walking, Increased edema, Pain, Impaired flexibility  Visit Diagnosis: Muscle weakness (generalized)  Difficulty in walking, not elsewhere classified  Decreased ROM of right shoulder     Problem List Patient Active Problem List   Diagnosis Date Noted  . Chronic pain of both knees 05/20/2016  . Primary osteoarthritis of both hands 05/20/2016  . H/O total shoulder replacement, right 05/20/2016  . Essential hypertension 05/20/2016  . Environmental allergies  05/20/2016  . Bilateral primary osteoarthritis of knee 05/19/2016  . Callus of foot 11/24/2012  . Pain in joint, ankle and foot 09/15/2012    Sumner Boast., PT 07/15/2016, 10:56 AM  [place], [place], [address] Phone: [phone]   Fax:  [phone]  Name: ELVINA BOSCH MRN: [id number] Date of Birth: [birth year]

## 2016-07-22 ENCOUNTER — Encounter: Payer: Self-pay | Admitting: Physical Therapy

## 2016-07-22 ENCOUNTER — Ambulatory Visit: Payer: Medicare Other | Attending: Rheumatology | Admitting: Physical Therapy

## 2016-07-22 DIAGNOSIS — R262 Difficulty in walking, not elsewhere classified: Secondary | ICD-10-CM | POA: Insufficient documentation

## 2016-07-22 DIAGNOSIS — R29898 Other symptoms and signs involving the musculoskeletal system: Secondary | ICD-10-CM | POA: Diagnosis present

## 2016-07-22 DIAGNOSIS — M6281 Muscle weakness (generalized): Secondary | ICD-10-CM | POA: Insufficient documentation

## 2016-07-22 DIAGNOSIS — G8929 Other chronic pain: Secondary | ICD-10-CM | POA: Diagnosis present

## 2016-07-22 DIAGNOSIS — M25611 Stiffness of right shoulder, not elsewhere classified: Secondary | ICD-10-CM | POA: Insufficient documentation

## 2016-07-22 DIAGNOSIS — M25561 Pain in right knee: Secondary | ICD-10-CM | POA: Insufficient documentation

## 2016-07-22 NOTE — Therapy (Signed)
[place], [place], [address] Phone: [phone]   Fax:  [phone]  Physical Therapy Treatment  Patient Details  Name: Danielle Dean MRN: [id number] Date of Birth: 12/30/1928 Referring Provider: Cy Blamer  Encounter Date: 07/22/2016      PT End of Session - 07/22/16 1053    Visit Number 12   Date for PT Re-Evaluation 08/12/16   PT Start Time 6503   PT Stop Time 1050   PT Time Calculation (min) 35 min   Activity Tolerance Patient tolerated treatment well   Behavior During Therapy [facility] for tasks assessed/performed      Past Medical History:  Diagnosis Date  . Callus   . Cataract fragments in both eyes following surgery   . Corns and callosities   . Hx of hysterectomy   . Hypertension     Past Surgical History:  Procedure Laterality Date  . ABDOMINAL HYSTERECTOMY    . NASAL SEPTUM SURGERY    . ROTATOR CUFF REPAIR Right     There were no vitals filed for this visit.      Subjective Assessment - 07/22/16 1016    Subjective Feels okay. No pain today. Patient verbalized pain had decreased 50%.                         [place] Adult PT Treatment/Exercise - 07/22/16 0001      Knee/Hip Exercises: Aerobic   Recumbent Bike 5 mins   Hurts her knee    Nustep 6 mins lvl 5      Knee/Hip Exercises: Machines for Strengthening   Cybex Knee Flexion 2 sets 10 10#     Knee/Hip Exercises: Standing   Walking with Sports Cord 10# 4 way x5 each    Other Standing Knee Exercises Marching on airex x20 each foot      Knee/Hip Exercises: Seated   Long Arc Quad Strengthening;AROM;Both;2 sets;10 reps   Long Arc Quad Limitations 3#   Abduction/Adduction  AROM;Both;Strengthening;2 sets;10 reps;Weights   Abd/Adduction Weights 3 lbs.   Sit to Sand 2 sets;10 reps;without UE support  3#                   PT Short Term Goals - 06/20/16 0956      PT SHORT TERM GOAL #1    Title independent with initial HEP   Status Achieved           PT Long Term Goals - 07/15/16 1055      PT LONG TERM GOAL #1   Title decrease TUG time to 15 seconds   Status On-going     PT LONG TERM GOAL #2   Title increase Berg blance score to 49/56   Status On-going     PT LONG TERM GOAL #3   Title report pain 50% less with cooking   Status Partially Met     PT LONG TERM GOAL #4   Title be able to go up and down stairs step over step   Status Partially Met               Plan - 07/22/16 1053    Clinical Impression Statement Patient tolerated treatment well but could not lift 5# on knee extension weight machine but could do three pound cuff weights. Patient had left toe pain when side stepping with right leg leading. Patient had balance trouble with standing marching on  airex with no hand support so OHS was used. Patient need min. VC on correct technique and posture and was up for trying new exercises.   Rehab Potential Good   PT Frequency 2x / week   PT Duration 8 weeks   PT Treatment/Interventions ADLs/Self Care Home Management;Electrical Stimulation;Cryotherapy;Moist Heat;Ultrasound;Gait training;Stair training;Functional mobility training;Patient/family education;Neuromuscular re-education;Balance training;Therapeutic exercise;Therapeutic activities;Manual techniques   PT Next Visit Plan continue to work on functional strength      Patient will benefit from skilled therapeutic intervention in order to improve the following deficits and impairments:  Decreased activity tolerance, Decreased balance, Decreased mobility, Decreased strength, Decreased endurance, Decreased range of motion, Difficulty walking, Increased edema, Pain, Impaired flexibility  Visit Diagnosis: Muscle weakness (generalized)  Difficulty in walking, not elsewhere classified  Chronic pain of right knee     Problem List Patient Active Problem List   Diagnosis Date Noted  . Chronic  pain of both knees 05/20/2016  . Primary osteoarthritis of both hands 05/20/2016  . H/O total shoulder replacement, right 05/20/2016  . Essential hypertension 05/20/2016  . Environmental allergies 05/20/2016  . Bilateral primary osteoarthritis of knee 05/19/2016  . Callus of foot 11/24/2012  . Pain in joint, ankle and foot 09/15/2012    Devoria Albe, [place] 07/22/2016, 10:58 AM  [place], [place], [address] Phone: [phone]   Fax:  [phone]  Name: Danielle Dean MRN: [id number] Date of Birth: [birth year]

## 2016-07-24 ENCOUNTER — Encounter: Payer: Self-pay | Admitting: Physical Therapy

## 2016-07-24 ENCOUNTER — Ambulatory Visit: Payer: Medicare Other | Admitting: Physical Therapy

## 2016-07-24 DIAGNOSIS — G8929 Other chronic pain: Secondary | ICD-10-CM

## 2016-07-24 DIAGNOSIS — M6281 Muscle weakness (generalized): Secondary | ICD-10-CM

## 2016-07-24 DIAGNOSIS — R262 Difficulty in walking, not elsewhere classified: Secondary | ICD-10-CM

## 2016-07-24 DIAGNOSIS — M25561 Pain in right knee: Secondary | ICD-10-CM

## 2016-07-24 NOTE — Therapy (Signed)
[place], [place], [address] Phone: [phone]   Fax:  [phone]  Physical Therapy Treatment  Patient Details  Name: Danielle Dean MRN: [id number] Date of Birth: 12/28/1928 Referring Provider: Cy Blamer  Encounter Date: 07/24/2016      PT End of Session - 07/24/16 1054    Visit Number 13   Date for PT Re-Evaluation 08/12/16   PT Start Time 7416   PT Stop Time 1055   PT Time Calculation (min) 40 min   Activity Tolerance Patient tolerated treatment well   Behavior During Therapy [facility] for tasks assessed/performed      Past Medical History:  Diagnosis Date  . Callus   . Cataract fragments in both eyes following surgery   . Corns and callosities   . Hx of hysterectomy   . Hypertension     Past Surgical History:  Procedure Laterality Date  . ABDOMINAL HYSTERECTOMY    . NASAL SEPTUM SURGERY    . ROTATOR CUFF REPAIR Right     There were no vitals filed for this visit.      Subjective Assessment - 07/24/16 1021    Subjective Feeling okay. No pain.   Currently in Pain? No/denies            [facility] PT Assessment - 07/24/16 0001      Berg Balance Test   Sit to Stand Able to stand without using hands and stabilize independently   Standing Unsupported Able to stand safely 2 minutes   Sitting with Back Unsupported but Feet Supported on Floor or Stool Able to sit safely and securely 2 minutes   Stand to Sit Sits safely with minimal use of hands   Transfers Able to transfer safely, minor use of hands   Standing Unsupported with Eyes Closed Able to stand 10 seconds safely   Standing Ubsupported with Feet Together Able to place feet together independently and stand 1 minute safely   From Standing, Reach Forward with Outstretched Arm Can reach confidently >25 cm (10")   From Standing Position, Pick up Object from Floor Able to pick up shoe, needs supervision   From Standing  Position, Turn to Look Behind Over each Shoulder Looks behind from both sides and weight shifts well   Turn 360 Degrees Able to turn 360 degrees safely in 4 seconds or less   Standing Unsupported, Alternately Place Feet on Step/Stool Able to stand independently and safely and complete 8 steps in 20 seconds   Standing Unsupported, One Foot in Front Needs help to step but can hold 15 seconds   Standing on One Leg Able to lift leg independently and hold equal to or more than 3 seconds   Total Score 50                     [facility] Adult PT Treatment/Exercise - 07/24/16 0001      Ambulation/Gait   Stairs Yes   Stairs Assistance 4: Min guard   Stair Management Technique One rail Right;Alternating pattern;Step to pattern   Number of Stairs 24   Height of Stairs 6     Knee/Hip Exercises: Aerobic   Nustep 6 mins lvl 5    Other Aerobic UBE 3 fwd /3 back L 4.5     Knee/Hip Exercises: Standing   Lateral Step Up Both;2 sets;10 reps;Hand Hold: 1;Step Height: 2";Step Height: 4"   Forward Step Up 1 set;Both;10 reps;Hand Hold: 1;Step  Height: 4"   Step Down Both;2 sets;10 reps;Hand Hold: 1;Step Height: 2";Step Height: 6"                  PT Short Term Goals - 06/20/16 0956      PT SHORT TERM GOAL #1   Title independent with initial HEP   Status Achieved           PT Long Term Goals - 07/24/16 1058      PT LONG TERM GOAL #1   Title decrease TUG time to 15 seconds   Baseline TUG 7 secs    Status Achieved     PT LONG TERM GOAL #2   Title increase Berg blance score to 49/56   Baseline 50/56   Status Achieved     PT LONG TERM GOAL #3   Title report pain 50% less with cooking   Status On-going     PT LONG TERM GOAL #4   Title be able to go up and down stairs step over step   Baseline Needs one hand support and going down the patient turns sideways   Status On-going     PT LONG TERM GOAL #5   Title increase AROM of the knees to 5-110 degrees flexion   Status  On-going               Plan - 07/24/16 1054    Clinical Impression Statement Patient tolerated treatment well. Her balance speed and safety were tested with the Berg and TUG and these were two goals that she met today. SLS, tandem stance and stairs are still problem areas for her. Her right knee is a limiting factor in step downs due to the increased pain. Patient needed mod. VC on correct technique and keeping her head up.   Rehab Potential Good   PT Frequency 2x / week   PT Duration 8 weeks   PT Treatment/Interventions ADLs/Self Care Home Management;Electrical Stimulation;Cryotherapy;Moist Heat;Ultrasound;Gait training;Stair training;Functional mobility training;Patient/family education;Neuromuscular re-education;Balance training;Therapeutic exercise;Therapeutic activities;Manual techniques   PT Next Visit Plan continue to work on functional strength      Patient will benefit from skilled therapeutic intervention in order to improve the following deficits and impairments:  Decreased activity tolerance, Decreased balance, Decreased mobility, Decreased strength, Decreased endurance, Decreased range of motion, Difficulty walking, Increased edema, Pain, Impaired flexibility  Visit Diagnosis: Muscle weakness (generalized)  Difficulty in walking, not elsewhere classified  Chronic pain of right knee     Problem List Patient Active Problem List   Diagnosis Date Noted  . Chronic pain of both knees 05/20/2016  . Primary osteoarthritis of both hands 05/20/2016  . H/O total shoulder replacement, right 05/20/2016  . Essential hypertension 05/20/2016  . Environmental allergies 05/20/2016  . Bilateral primary osteoarthritis of knee 05/19/2016  . Callus of foot 11/24/2012  . Pain in joint, ankle and foot 09/15/2012    Devoria Albe, [place] 07/24/2016, 11:00 AM  [place], [place], [address] Phone:  [phone]   Fax:  [phone]  Name: Danielle Dean MRN: [id number] Date of Birth: 1929-05-22

## 2016-07-29 ENCOUNTER — Encounter: Payer: Self-pay | Admitting: Physical Therapy

## 2016-07-29 ENCOUNTER — Ambulatory Visit: Payer: Medicare Other | Admitting: Physical Therapy

## 2016-07-29 DIAGNOSIS — G8929 Other chronic pain: Secondary | ICD-10-CM

## 2016-07-29 DIAGNOSIS — R262 Difficulty in walking, not elsewhere classified: Secondary | ICD-10-CM

## 2016-07-29 DIAGNOSIS — M6281 Muscle weakness (generalized): Secondary | ICD-10-CM | POA: Diagnosis not present

## 2016-07-29 DIAGNOSIS — R29898 Other symptoms and signs involving the musculoskeletal system: Secondary | ICD-10-CM

## 2016-07-29 DIAGNOSIS — M25561 Pain in right knee: Secondary | ICD-10-CM

## 2016-07-29 DIAGNOSIS — M25611 Stiffness of right shoulder, not elsewhere classified: Secondary | ICD-10-CM

## 2016-07-29 NOTE — Therapy (Signed)
[facility] [place] [address] [place], [name], [address] Phone: [phone]   Fax:  [phone]  Physical Therapy Treatment  Patient Details  Name: APARNA VANDERWEELE MRN: [id number] Date of Birth: 08-28-1928 Referring Provider: Melvenia Needles  Encounter Date: 07/29/2016      PT End of Session - 07/29/16 1222    Visit Number 14   Date for PT Re-Evaluation 08/12/16   PT Start Time 1145   PT Stop Time 1223   PT Time Calculation (min) 38 min   Activity Tolerance Patient tolerated treatment well   Behavior During Therapy [facility] for tasks assessed/performed      Past Medical History:  Diagnosis Date  . Callus   . Cataract fragments in both eyes following surgery   . Corns and callosities   . Hx of hysterectomy   . Hypertension     Past Surgical History:  Procedure Laterality Date  . ABDOMINAL HYSTERECTOMY    . NASAL SEPTUM SURGERY    . ROTATOR CUFF REPAIR Right     There were no vitals filed for this visit.      Subjective Assessment - 07/29/16 1154    Subjective Right arm is hurting today and a little bit in LEs.    Currently in Pain? Yes   Pain Score 5    Pain Orientation Right                         [facility] Adult PT Treatment/Exercise - 07/29/16 0001      High Level Balance   High Level Balance Comments side step and marching over cones, cone pick up at various heights     Knee/Hip Exercises: Aerobic   Nustep 6 mins lvl 5    Other Aerobic UBE 3 fwd /3 back L 4.5     Knee/Hip Exercises: Standing   Lateral Step Up Both;2 sets;10 reps;Hand Hold: 2;Step Height: 4"   Forward Step Up Both;10 reps;Hand Hold: 1;Step Height: 4";2 sets   Step Down Both;2 sets;10 reps;Hand Hold: 1;Step Height: 4"     Shoulder Exercises: Standing   External Rotation AROM;Strengthening;Both;20 reps;Weights  1#   Flexion AROM;Strengthening;Both;Weights;20 reps   Shoulder Flexion Weight (lbs) 1#   Row  AROM;Strengthening;Both;10 reps;Weights   Row Weight (lbs) 1#   Other Standing Exercises bicep curls 2x10 1#,      Shoulder Exercises: ROM/Strengthening   Wall Pushups 20 reps                  PT Short Term Goals - 06/20/16 7253      PT SHORT TERM GOAL #1   Title independent with initial HEP   Status Achieved           PT Long Term Goals - 07/24/16 1058      PT LONG TERM GOAL #1   Title decrease TUG time to 15 seconds   Baseline TUG 7 secs    Status Achieved     PT LONG TERM GOAL #2   Title increase Berg blance score to 49/56   Baseline 50/56   Status Achieved     PT LONG TERM GOAL #3   Title report pain 50% less with cooking   Status On-going     PT LONG TERM GOAL #4   Title be able to go up and down stairs step over step   Baseline Needs one hand support and going down the patient turns  sideways   Status On-going     PT LONG TERM GOAL #5   Title increase AROM of the knees to 5-110 degrees flexion   Status On-going               Plan - 07/29/16 1223    Clinical Impression Statement Patient had increased pain in shoulder today so the focus was on the legs but they began to fatigue so treatment was alternated to low level shoulder exercises. Patient could not complete step downs on 4 in with right leg leading. Patient needed mod. VC on keeping her head up and correct technique. Patient wanted to circumduct around the cones instead of pick her feet up over them.    Rehab Potential Good   PT Frequency 2x / week   PT Duration 8 weeks   PT Treatment/Interventions ADLs/Self Care Home Management;Electrical Stimulation;Cryotherapy;Moist Heat;Ultrasound;Gait training;Stair training;Functional mobility training;Patient/family education;Neuromuscular re-education;Balance training;Therapeutic exercise;Therapeutic activities;Manual techniques   PT Next Visit Plan continue to work on functional strength      Patient will benefit from skilled therapeutic  intervention in order to improve the following deficits and impairments:  Decreased activity tolerance, Decreased balance, Decreased mobility, Decreased strength, Decreased endurance, Decreased range of motion, Difficulty walking, Increased edema, Pain, Impaired flexibility  Visit Diagnosis: Muscle weakness (generalized)  Difficulty in walking, not elsewhere classified  Chronic pain of right knee  Decreased ROM of right shoulder  Weakness of shoulder     Problem List Patient Active Problem List   Diagnosis Date Noted  . Chronic pain of both knees 05/20/2016  . Primary osteoarthritis of both hands 05/20/2016  . H/O total shoulder replacement, right 05/20/2016  . Essential hypertension 05/20/2016  . Environmental allergies 05/20/2016  . Bilateral primary osteoarthritis of knee 05/19/2016  . Callus of foot 11/24/2012  . Pain in joint, ankle and foot 09/15/2012    Phillips OdorBrittany Dashanti [place], [place]  07/29/2016, 12:27 PM  [facility] [address] [place], [place], [address] Phone: [phone]   Fax:  [phone]  Name: Walker KehrKarma V Parton MRN: [id number] Date of Birth: 05/05/1929

## 2016-07-31 ENCOUNTER — Ambulatory Visit: Payer: Medicare Other | Admitting: Physical Therapy

## 2016-08-06 ENCOUNTER — Ambulatory Visit: Payer: Medicare Other | Admitting: Physical Therapy

## 2016-08-06 ENCOUNTER — Encounter: Payer: Self-pay | Admitting: Physical Therapy

## 2016-08-06 DIAGNOSIS — M6281 Muscle weakness (generalized): Secondary | ICD-10-CM | POA: Diagnosis not present

## 2016-08-06 DIAGNOSIS — M25611 Stiffness of right shoulder, not elsewhere classified: Secondary | ICD-10-CM

## 2016-08-06 DIAGNOSIS — M25561 Pain in right knee: Secondary | ICD-10-CM

## 2016-08-06 DIAGNOSIS — R262 Difficulty in walking, not elsewhere classified: Secondary | ICD-10-CM

## 2016-08-06 DIAGNOSIS — G8929 Other chronic pain: Secondary | ICD-10-CM

## 2016-08-06 NOTE — Therapy (Signed)
[facility] [address] [place], [place], [address] Phone: [phone]   Fax:  [phone]  Physical Therapy Treatment  Patient Details  Name: Danielle Dean MRN: [id number] Date of Birth: 07/02/1928 Referring Provider: Melvenia NeedlesShali Deveshwar  Encounter Date: 08/06/2016      PT End of Session - 08/06/16 1230    Visit Number 15   Date for PT Re-Evaluation 08/12/16   PT Start Time 1147   PT Stop Time 1230   PT Time Calculation (min) 43 min   Activity Tolerance Patient tolerated treatment well   Behavior During Therapy [facility] for tasks assessed/performed      Past Medical History:  Diagnosis Date  . Callus   . Cataract fragments in both eyes following surgery   . Corns and callosities   . Hx of hysterectomy   . Hypertension     Past Surgical History:  Procedure Laterality Date  . ABDOMINAL HYSTERECTOMY    . NASAL SEPTUM SURGERY    . ROTATOR CUFF REPAIR Right     There were no vitals filed for this visit.      Subjective Assessment - 08/06/16 1147    Subjective "Im alright"   Currently in Pain? No/denies   Pain Score 0-No pain                         [facility] Adult PT Treatment/Exercise - 08/06/16 0001      High Level Balance   High Level Balance Activities Negotiating over obstacles;Tandem walking   High Level Balance Comments Standing march on airex,  Lateral step over foam roll   Attempt step over on sit fit, reports cant put weight on RLE     Knee/Hip Exercises: Aerobic   Nustep 6 mins lvl 5    Other Aerobic UBE 3 fwd /3 back L 4.5     Knee/Hip Exercises: Standing   Lateral Step Up Both;2 sets;10 reps;Hand Hold: 2;Step Height: 4"   Forward Step Up Both;10 reps;2 sets;Hand Hold: 0;Step Height: 6"   Walking with Sports Cord 20lb front back x3     Knee/Hip Exercises: Seated   Sit to Sand 2 sets;10 reps;without UE support  holding red ball                   PT Short Term  Goals - 06/20/16 0956      PT SHORT TERM GOAL #1   Title independent with initial HEP   Status Achieved           PT Long Term Goals - 07/24/16 1058      PT LONG TERM GOAL #1   Title decrease TUG time to 15 seconds   Baseline TUG 7 secs    Status Achieved     PT LONG TERM GOAL #2   Title increase Berg blance score to 49/56   Baseline 50/56   Status Achieved     PT LONG TERM GOAL #3   Title report pain 50% less with cooking   Status On-going     PT LONG TERM GOAL #4   Title be able to go up and down stairs step over step   Baseline Needs one hand support and going down the patient turns sideways   Status On-going     PT LONG TERM GOAL #5   Title increase AROM of the knees to 5-110 degrees flexion   Status On-going  Plan - 08/06/16 1230    Clinical Impression Statement Pt reports some R knee pain with step overs on dyna disk and was unable to complete. Pt wish some instability with all balance interventions, VC's required to get knees higher with standing march. reports no recent falls at home.   Rehab Potential Good   PT Frequency 2x / week   PT Duration 8 weeks   PT Treatment/Interventions ADLs/Self Care Home Management;Electrical Stimulation;Cryotherapy;Moist Heat;Ultrasound;Gait training;Stair training;Functional mobility training;Patient/family education;Neuromuscular re-education;Balance training;Therapeutic exercise;Therapeutic activities;Manual techniques   PT Next Visit Plan continue to work on functional strength      Patient will benefit from skilled therapeutic intervention in order to improve the following deficits and impairments:  Decreased activity tolerance, Decreased balance, Decreased mobility, Decreased strength, Decreased endurance, Decreased range of motion, Difficulty walking, Increased edema, Pain, Impaired flexibility  Visit Diagnosis: Muscle weakness (generalized)  Difficulty in walking, not elsewhere  classified  Chronic pain of right knee  Decreased ROM of right shoulder     Problem List Patient Active Problem List   Diagnosis Date Noted  . Chronic pain of both knees 05/20/2016  . Primary osteoarthritis of both hands 05/20/2016  . H/O total shoulder replacement, right 05/20/2016  . Essential hypertension 05/20/2016  . Environmental allergies 05/20/2016  . Bilateral primary osteoarthritis of knee 05/19/2016  . Callus of foot 11/24/2012  . Pain in joint, ankle and foot 09/15/2012    Grayce Sessions, PTA 08/06/2016, 12:34 PM  [facility] [place] [address] [place], [name], [address] Phone: [phone]   Fax:  [phone]  Name: Danielle Dean MRN: [id number] Date of Birth: 1928/12/14

## 2016-08-07 ENCOUNTER — Ambulatory Visit: Payer: Medicare Other | Admitting: Physical Therapy

## 2016-08-07 ENCOUNTER — Encounter: Payer: Self-pay | Admitting: Physical Therapy

## 2016-08-07 DIAGNOSIS — M6281 Muscle weakness (generalized): Secondary | ICD-10-CM | POA: Diagnosis not present

## 2016-08-07 DIAGNOSIS — R262 Difficulty in walking, not elsewhere classified: Secondary | ICD-10-CM

## 2016-08-07 NOTE — Therapy (Signed)
[place], [place], [address] Phone: [phone]   Fax:  [phone]  Physical Therapy Treatment  Patient Details  Name: Danielle Dean MRN: [id number] Date of Birth: 1928/07/12 Referring Provider: Cy Blamer  Encounter Date: 08/07/2016      PT End of Session - 08/07/16 0942    Visit Number 16   Date for PT Re-Evaluation 08/12/16   PT Start Time 0930   PT Stop Time 1015   PT Time Calculation (min) 45 min      Past Medical History:  Diagnosis Date  . Callus   . Cataract fragments in both eyes following surgery   . Corns and callosities   . Hx of hysterectomy   . Hypertension     Past Surgical History:  Procedure Laterality Date  . ABDOMINAL HYSTERECTOMY    . NASAL SEPTUM SURGERY    . ROTATOR CUFF REPAIR Right     There were no vitals filed for this visit.      Subjective Assessment - 08/07/16 0935    Subjective no pain   Currently in Pain? No/denies                         [facility] Adult PT Treatment/Exercise - 08/07/16 0001      Knee/Hip Exercises: Aerobic   Nustep 6 mins lvl 5    Other Aerobic UBE 3 fwd /3 back L 4.5     Knee/Hip Exercises: Machines for Strengthening   Cybex Knee Extension attempted, too painful   Cybex Knee Flexion 10# 2 sets 10     Knee/Hip Exercises: Standing   Hip Flexion Stengthening;Both;2 sets;10 reps;Knee bent  3#   Hip Abduction Stengthening;Both;2 sets;10 reps;Knee straight  3#     Knee/Hip Exercises: Seated   Long Arc Quad Strengthening;Both;2 sets;10 reps;Weights   Long Arc Quad Weight 3 lbs.   Sit to Sand 2 sets;10 reps;without UE support  with wt ball press                  PT Short Term Goals - 06/20/16 0956      PT SHORT TERM GOAL #1   Title independent with initial HEP   Status Achieved           PT Long Term Goals - 08/07/16 1975      PT LONG TERM GOAL #3   Title report pain 50% less  with cooking   Status Achieved     PT LONG TERM GOAL #4   Title be able to go up and down stairs step over step   Status Partially Met     PT LONG TERM GOAL #5   Title increase AROM of the knees to 5-110 degrees flexion   Baseline 0-120 Left. 0-125 RT   Status Achieved               Plan - 08/07/16 8832    Clinical Impression Statement All goals met except steps and pt verb she does not have to do at home but does practice.   PT Next Visit Plan D/C next week . Cert date up 5/49/82. Encourage Independance ex program.      Patient will benefit from skilled therapeutic intervention in order to improve the following deficits and impairments:  Decreased activity tolerance, Decreased balance, Decreased mobility, Decreased strength, Decreased endurance, Decreased range of motion, Difficulty walking, Increased edema, Pain, Impaired flexibility  Visit Diagnosis: Muscle weakness (generalized)  Difficulty in walking, not elsewhere classified     Problem List Patient Active Problem List   Diagnosis Date Noted  . Chronic pain of both knees 05/20/2016  . Primary osteoarthritis of both hands 05/20/2016  . H/O total shoulder replacement, right 05/20/2016  . Essential hypertension 05/20/2016  . Environmental allergies 05/20/2016  . Bilateral primary osteoarthritis of knee 05/19/2016  . Callus of foot 11/24/2012  . Pain in joint, ankle and foot 09/15/2012    Vlasta Baskin,ANGIE PTA 08/07/2016, 10:00 AM  [place], [place], [address] Phone: [phone]   Fax:  [phone]  Name: Danielle Dean MRN: [id number] Date of Birth: [birth year]

## 2016-08-12 ENCOUNTER — Encounter: Payer: Self-pay | Admitting: Physical Therapy

## 2016-08-12 ENCOUNTER — Ambulatory Visit: Payer: Medicare Other | Admitting: Physical Therapy

## 2016-08-12 DIAGNOSIS — R262 Difficulty in walking, not elsewhere classified: Secondary | ICD-10-CM

## 2016-08-12 DIAGNOSIS — M6281 Muscle weakness (generalized): Secondary | ICD-10-CM | POA: Diagnosis not present

## 2016-08-12 NOTE — Therapy (Signed)
[facility] [place], [place], [address] Phone: [phone]   Fax:  [phone]  Physical Therapy Treatment  Patient Details  Name: Danielle Dean MRN: [id number] Date of Birth: Feb 03, 1929 Referring Provider: Cy Blamer  Encounter Date: 08/12/2016      PT End of Session - 08/12/16 1020    Visit Number 17   Date for PT Re-Evaluation 08/12/16   PT Start Time 1005   PT Stop Time 1050   PT Time Calculation (min) 45 min      Past Medical History:  Diagnosis Date  . Callus   . Cataract fragments in both eyes following surgery   . Corns and callosities   . Hx of hysterectomy   . Hypertension     Past Surgical History:  Procedure Laterality Date  . ABDOMINAL HYSTERECTOMY    . NASAL SEPTUM SURGERY    . ROTATOR CUFF REPAIR Right     There were no vitals filed for this visit.      Subjective Assessment - 08/12/16 1013    Subjective I think i will try independant program for 1 month   Currently in Pain? No/denies                         [facility] Adult PT Treatment/Exercise - 08/12/16 0001      Knee/Hip Exercises: Aerobic   Nustep 6 mins lvl 5    Other Aerobic UBE 3 fwd /3 back L 4.5     Knee/Hip Exercises: Seated   Long Arc Quad Strengthening;Both;2 sets;10 reps;Weights   Long Arc Quad Weight 3 lbs.   Heel Slides Strengthening;Both;2 sets;15 reps  green tband   Clamshell with TheraBand Red   Marching Strengthening;Both;2 sets;15 reps;Weights   Marching Weights 3 lbs.   Abduction/Adduction  Strengthening;Both;2 sets;15 reps;Weights   Abd/Adduction Weights 3 lbs.     Shoulder Exercises: ROM/Strengthening   Other ROM/Strengthening Exercises lat pull and row 2 sets 10 15#                PT Education - 08/12/16 1019    Education provided Yes   Education Details transition to our independant gym program   Person(s) Educated Patient   Methods  Explanation;Demonstration   Comprehension Verbalized understanding          PT Short Term Goals - 06/20/16 0956      PT SHORT TERM GOAL #1   Title independent with initial HEP   Status Achieved           PT Long Term Goals - 08/07/16 0937      PT LONG TERM GOAL #3   Title report pain 50% less with cooking   Status Achieved     PT LONG TERM GOAL #4   Title be able to go up and down stairs step over step   Status Partially Met     PT LONG TERM GOAL #5   Title increase AROM of the knees to 5-110 degrees flexion   Baseline 0-120 Left. 0-125 RT   Status Achieved               Plan - 08/12/16 1020    Clinical Impression Statement D/C to gym program to maintain. all goals met   PT Next Visit Plan Discharged      Patient will benefit from skilled therapeutic intervention in order to improve the following deficits and impairments:  Decreased  activity tolerance, Decreased balance, Decreased mobility, Decreased strength, Decreased endurance, Decreased range of motion, Difficulty walking, Increased edema, Pain, Impaired flexibility  Visit Diagnosis: Muscle weakness (generalized)  Difficulty in walking, not elsewhere classified     Problem List Patient Active Problem List   Diagnosis Date Noted  . Chronic pain of both knees 05/20/2016  . Primary osteoarthritis of both hands 05/20/2016  . H/O total shoulder replacement, right 05/20/2016  . Essential hypertension 05/20/2016  . Environmental allergies 05/20/2016  . Bilateral primary osteoarthritis of knee 05/19/2016  . Callus of foot 11/24/2012  . Pain in joint, ankle and foot 09/15/2012   PHYSICAL THERAPY DISCHARGE SUMMARY   Plan: Patient agrees to discharge.  Patient goals were met. Patient is being discharged due to meeting the stated rehab goals.  ?????      Tyrrell Stephens,ANGIE PTA 08/12/2016, 10:25 AM  [facility]  [place], [place], [address] Phone: [phone]   Fax:  [phone]  Name: Danielle Dean MRN: [id number] Date of Birth: 1929/02/06

## 2016-08-14 ENCOUNTER — Ambulatory Visit: Payer: Medicare Other | Admitting: Physical Therapy

## 2016-09-10 NOTE — Progress Notes (Signed)
Office Visit Note  Patient: Danielle Dean             Date of Birth: Feb 20, 1929           MRN: [id number]             PCP: Georgann Housekeeper, MD Referring: Georgann Housekeeper, MD Visit Date: 09/18/2016 Occupation: @GUAROCC @    Subjective:  Right knee pain.   History of Present Illness: Danielle Dean is a 83 y.o. female with history of osteoarthritis. She states her right knee joint cortisone injection was helpful. She still continues to have some discomfort in her right knee joint. She's been using a brace which is helpful to some extent. Her right total shoulder replacement is doing well but she has limited range of motion. She continues to have some stiffness in her bilateral hands.  Activities of Daily Living:  Patient reports morning stiffness for 5 minutes.   Patient Denies nocturnal pain.  Difficulty dressing/grooming: Denies Difficulty climbing stairs: Reports Difficulty getting out of chair: Reports Difficulty using hands for taps, buttons, cutlery, and/or writing: Reports   Review of Systems  Constitutional: Positive for fatigue. Negative for night sweats, weight gain, weight loss and weakness.  HENT: Negative for mouth sores, trouble swallowing, trouble swallowing, mouth dryness and nose dryness.   Eyes: Negative for pain, redness, visual disturbance and dryness.  Respiratory: Positive for shortness of breath. Negative for cough and difficulty breathing.        Exertion  Cardiovascular: Negative for chest pain, palpitations, hypertension, irregular heartbeat and swelling in legs/feet.  Gastrointestinal: Negative for blood in stool, constipation and diarrhea.  Endocrine: Negative for increased urination.  Genitourinary: Negative for vaginal dryness.  Musculoskeletal: Positive for arthralgias, joint pain and morning stiffness. Negative for joint swelling, myalgias, muscle weakness, muscle tenderness and myalgias.  Skin: Negative for color change, rash, hair loss, skin  tightness, ulcers and sensitivity to sunlight.  Allergic/Immunologic: Negative for susceptible to infections.  Neurological: Negative for dizziness, memory loss and night sweats.  Hematological: Negative for swollen glands.  Psychiatric/Behavioral: Positive for sleep disturbance. Negative for depressed mood. The patient is nervous/anxious.     PMFS History:  Patient Active Problem List   Diagnosis Date Noted  . Chronic pain of both knees 05/20/2016  . Primary osteoarthritis of both hands 05/20/2016  . H/O total shoulder replacement, right 05/20/2016  . Essential hypertension 05/20/2016  . Environmental allergies 05/20/2016  . Bilateral primary osteoarthritis of knee 05/19/2016  . Callus of foot 11/24/2012  . Pain in joint, ankle and foot 09/15/2012    Past Medical History:  Diagnosis Date  . Callus   . Cataract fragments in both eyes following surgery   . Corns and callosities   . Hx of hysterectomy   . Hypertension     Family History  Problem Relation Age of Onset  . Arthritis Mother   . Diabetes Father    Past Surgical History:  Procedure Laterality Date  . ABDOMINAL HYSTERECTOMY    . NASAL SEPTUM SURGERY    . ROTATOR CUFF REPAIR Right    Social History   Social History Narrative  . No narrative on file     Objective: Vital Signs: BP 136/62   Pulse 74   Resp 14   Wt 106 lb (48.1 kg)   BMI 21.41 kg/m    Physical Exam  Constitutional: She is oriented to person, place, and time. She appears well-developed and well-nourished.  HENT:  Head: Normocephalic and atraumatic.  Eyes: Conjunctivae and EOM are normal.  Neck: Normal range of motion.  Cardiovascular: Normal rate, regular rhythm, normal heart sounds and intact distal pulses.   Pulmonary/Chest: Effort normal and breath sounds normal.  Abdominal: Soft. Bowel sounds are normal.  Lymphadenopathy:    She has no cervical adenopathy.  Neurological: She is alert and oriented to person, place, and time.    Skin: Skin is warm and dry. Capillary refill takes less than 2 seconds.  Psychiatric: She has a normal mood and affect. Her behavior is normal.  Nursing note and vitals reviewed.    Musculoskeletal Exam: C-spine and thoracic lumbar spine good range of motion. Her right shoulder joint abduction was limited to 100. Left shoulder joint was full range of motion. Elbow joints, wrist joints are good range of motion. She has thickening and subluxation of several of her aunt PIP and DIP joints bilaterally. There is mild inflammatory change in her right first PIP joint. Hip joints knee joints ankles MTPs PIPs with good range of motion with no synovitis.  CDAI Exam: No CDAI exam completed.    Investigation: Findings:  Knee x-rays done 12/21/15 visit  were unremarkable.  There was no evidence of fracture except for osteoarthritis which was present.    Imaging: No results found.  Speciality Comments: No specialty comments available.    Procedures:  No procedures performed Allergies: Patient has no known allergies.   Assessment / Plan:     Visit Diagnoses: Bilateral primary osteoarthritis of knee: She has some chronic discomfort she did quite well after the last cortisone injection. She had inadequate response to Visco supplement injections. I've given her a prescription for knee joint brace.  Primary osteoarthritis of both hands: Joint protection and muscle strengthening discussed. I offered ring brace for her right first PIP which she declined.  H/O total shoulder replacement, right: She has decreased range of motion  Chronic pain of both knees  Essential hypertension   She also has Essential hypertension; and Environmental allergies on her problem list.   Orders: No orders of the defined types were placed in this encounter.  No orders of the defined types were placed in this encounter.   Face-to-face time spent with patient was 20 minutes. 50% of time was spent in counseling  and coordination of care.  Follow-Up Instructions: Return in about 6 months (around 03/20/2017) for Osteoarthritis.   Pollyann SavoyShaili Deveshwar, MD  Note - This record has been created using Animal nutritionistDragon software.  Chart creation errors have been sought, but may not always  have been located. Such creation errors do not reflect on  the standard of medical care.

## 2016-09-18 ENCOUNTER — Ambulatory Visit (INDEPENDENT_AMBULATORY_CARE_PROVIDER_SITE_OTHER): Payer: Medicare Other | Admitting: Rheumatology

## 2016-09-18 ENCOUNTER — Encounter: Payer: Self-pay | Admitting: Rheumatology

## 2016-09-18 VITALS — BP 136/62 | HR 74 | Resp 14 | Wt 106.0 lb

## 2016-09-18 DIAGNOSIS — M170 Bilateral primary osteoarthritis of knee: Secondary | ICD-10-CM

## 2016-09-18 DIAGNOSIS — I10 Essential (primary) hypertension: Secondary | ICD-10-CM | POA: Diagnosis not present

## 2016-09-18 DIAGNOSIS — M25561 Pain in right knee: Secondary | ICD-10-CM

## 2016-09-18 DIAGNOSIS — G8929 Other chronic pain: Secondary | ICD-10-CM

## 2016-09-18 DIAGNOSIS — M19041 Primary osteoarthritis, right hand: Secondary | ICD-10-CM | POA: Diagnosis not present

## 2016-09-18 DIAGNOSIS — M19042 Primary osteoarthritis, left hand: Secondary | ICD-10-CM | POA: Diagnosis not present

## 2016-09-18 DIAGNOSIS — Z96611 Presence of right artificial shoulder joint: Secondary | ICD-10-CM

## 2016-09-18 DIAGNOSIS — M25562 Pain in left knee: Secondary | ICD-10-CM | POA: Diagnosis not present

## 2016-10-07 DIAGNOSIS — Z96611 Presence of right artificial shoulder joint: Secondary | ICD-10-CM | POA: Insufficient documentation

## 2016-10-07 NOTE — Progress Notes (Signed)
Office Visit Note  Patient: Danielle Dean             Date of Birth: Sep 05, 1928           MRN: [id number]             PCP: Georgann Housekeeper, MD Referring: Georgann Housekeeper, MD Visit Date: 10/09/2016 Occupation: @    Subjective:  Pain of the Right Knee (Pain goes into calf of right leg)   History of Present Illness: Danielle Dean is a 83 y.o. female with history of osteoarthritis of the knee joints and hands. She states her right knee joint has been hurting a lot. She also has pain in the muscles around her knees. The left knee is not as painful. She does not have much discomfort in her hands currently.  Activities of Daily Living:  Patient reports morning stiffness for 5 minutes.   Patient Reports nocturnal pain.  Difficulty dressing/grooming: Denies Difficulty climbing stairs: Reports Difficulty getting out of chair: Reports Difficulty using hands for taps, buttons, cutlery, and/or writing: Reports   Review of Systems  Constitutional: Negative for fatigue, night sweats, weight gain, weight loss and weakness.  HENT: Negative for mouth sores, trouble swallowing, trouble swallowing, mouth dryness and nose dryness.   Eyes: Positive for dryness. Negative for pain, redness and visual disturbance.  Respiratory: Negative for cough, shortness of breath and difficulty breathing.   Cardiovascular: Negative for chest pain, palpitations, hypertension, irregular heartbeat and swelling in legs/feet.  Gastrointestinal: Negative for blood in stool, constipation and diarrhea.  Endocrine: Negative for increased urination.  Genitourinary: Negative for vaginal dryness.  Musculoskeletal: Positive for arthralgias and joint pain. Negative for joint swelling, myalgias, muscle weakness, morning stiffness, muscle tenderness and myalgias.  Skin: Negative for color change, rash, hair loss, skin tightness, ulcers and sensitivity to sunlight.  Allergic/Immunologic: Negative for susceptible to  infections.  Neurological: Negative for dizziness, memory loss and night sweats.  Hematological: Negative for swollen glands.  Psychiatric/Behavioral: Positive for sleep disturbance. Negative for depressed mood. The patient is not nervous/anxious.     PMFS History:  Patient Active Problem List   Diagnosis Date Noted  . History of right shoulder replacement 10/07/2016  . Chronic pain of both knees 05/20/2016  . Osteoarthritis of both hands 05/20/2016  . H/O total shoulder replacement, right 05/20/2016  . Essential hypertension 05/20/2016  . Environmental allergies 05/20/2016  . Primary osteoarthritis of both knees 05/19/2016  . Callus of foot 11/24/2012  . Pain in joint, ankle and foot 09/15/2012    Past Medical History:  Diagnosis Date  . Callus   . Cataract fragments in both eyes following surgery   . Corns and callosities   . Hx of hysterectomy   . Hypertension     Family History  Problem Relation Age of Onset  . Arthritis Mother   . Diabetes Father    Past Surgical History:  Procedure Laterality Date  . ABDOMINAL HYSTERECTOMY    . NASAL SEPTUM SURGERY    . ROTATOR CUFF REPAIR Right    Social History   Social History Narrative  . No narrative on file     Objective: Vital Signs: BP 118/60   Pulse 62   Resp 12   Wt 109 lb (49.4 kg)   BMI 22.02 kg/m    Physical Exam  Constitutional: She is oriented to person, place, and time. She appears well-developed and well-nourished.  HENT:  Head: Normocephalic and atraumatic.  Eyes: Conjunctivae and EOM are normal.  Neck: Normal range of motion.  Cardiovascular: Normal rate, regular rhythm, normal heart sounds and intact distal pulses.   Pulmonary/Chest: Effort normal and breath sounds normal.  Abdominal: Soft. Bowel sounds are normal.  Lymphadenopathy:    She has no cervical adenopathy.  Neurological: She is alert and oriented to person, place, and time.  Skin: Skin is warm and dry. Capillary refill takes less  than 2 seconds.  Psychiatric: She has a normal mood and affect. Her behavior is normal.  Nursing note and vitals reviewed.    Musculoskeletal Exam: C-spine and thoracic lumbar spine good range of motion. Shoulder joints although joints wrist joints are good range of motion. Her right shoulder joint has some limitation. She has DIP PIP thickening in her bilateral hands with no synovitis. She had good range of motion of her bilateral knee joints. Right knee joint was painful range of motion was warmth and swelling.  CDAI Exam: No CDAI exam completed.    Investigation: No additional findings.   Imaging: No results found.  Speciality Comments: No specialty comments available.    Procedures:  Large Joint Inj Date/Time: 10/09/2016 10:03 AM Performed by: Pollyann Savoy Authorized by: Pollyann Savoy   Consent Given by:  Patient Site marked: the procedure site was marked   Timeout: prior to procedure the correct patient, procedure, and site was verified   Indications:  Pain and joint swelling Location:  Knee Site:  R knee Prep: patient was prepped and draped in usual sterile fashion   Needle Size:  27 G Needle Length:  1.5 inches Approach:  Medial Ultrasound Guidance: No   Fluoroscopic Guidance: No   Arthrogram: No   Medications:  1.5 mL lidocaine 1 %; 40 mg triamcinolone acetonide 40 MG/ML Aspiration Attempted: Yes   Aspirate amount (mL):  0 Patient tolerance:  Patient tolerated the procedure well with no immediate complications   Allergies: Patient has no known allergies.   Assessment / Plan:     Visit Diagnoses: Primary osteoarthritis of both knees - prescription for knee joint brace given on 09/18/16, cortisone inj last on 05/20/16 with good relief, Euflexxa finished around 11/2015. Patient did not have good response to Euflexxa. She states that Hyalgan worked in the past that her but it was not covered by her insurance. She had warmth and swelling in her right knee  joint today per request after informed consent was obtained the knee joint was injected with cortisone the procedures described above. I've advised her to continue using bracing if she has persistent symptoms she supposed to notify us.  Primary osteoarthritis of both hands: She is severe disease joint protection and muscle strengthening discussed.  History of right shoulder replacement: Doing fairly well  Essential hypertension : Her blood pressure was controlled today. I've advised her to monitor blood pressure closely after cortisone injection.   Orders: Orders Placed This Encounter  Procedures  . Large Joint Injection/Arthrocentesis   No orders of the defined types were placed in this encounter.   Face-to-face time spent with patient was 20 minutes. 50% of time was spent in counseling and coordination of care.  Follow-Up Instructions: Return in about 4 months (around 02/08/2017) for Osteoarthritis.   Pollyann Savoy, MD  Note - This record has been created using Animal nutritionist.  Chart creation errors have been sought, but may not always  have been located. Such creation errors do not reflect on  the standard of medical care.

## 2016-10-09 ENCOUNTER — Ambulatory Visit (INDEPENDENT_AMBULATORY_CARE_PROVIDER_SITE_OTHER): Payer: Medicare Other | Admitting: Rheumatology

## 2016-10-09 ENCOUNTER — Encounter: Payer: Self-pay | Admitting: Rheumatology

## 2016-10-09 VITALS — BP 118/60 | HR 62 | Resp 12 | Wt 109.0 lb

## 2016-10-09 DIAGNOSIS — M25561 Pain in right knee: Secondary | ICD-10-CM

## 2016-10-09 DIAGNOSIS — M19041 Primary osteoarthritis, right hand: Secondary | ICD-10-CM | POA: Diagnosis not present

## 2016-10-09 DIAGNOSIS — M19042 Primary osteoarthritis, left hand: Secondary | ICD-10-CM

## 2016-10-09 DIAGNOSIS — Z96611 Presence of right artificial shoulder joint: Secondary | ICD-10-CM

## 2016-10-09 DIAGNOSIS — M170 Bilateral primary osteoarthritis of knee: Secondary | ICD-10-CM

## 2016-10-09 DIAGNOSIS — G8929 Other chronic pain: Secondary | ICD-10-CM | POA: Diagnosis not present

## 2016-10-09 DIAGNOSIS — I10 Essential (primary) hypertension: Secondary | ICD-10-CM | POA: Diagnosis not present

## 2016-10-09 MED ORDER — TRIAMCINOLONE ACETONIDE 40 MG/ML IJ SUSP
40.0000 mg | INTRAMUSCULAR | Status: AC | PRN
  Administered 2016-10-09: 40 mg via INTRA_ARTICULAR

## 2016-10-09 MED ORDER — LIDOCAINE HCL 1 % IJ SOLN
1.5000 mL | INTRAMUSCULAR | Status: AC | PRN
  Administered 2016-10-09: 1.5 mL

## 2017-01-19 ENCOUNTER — Encounter: Payer: Self-pay | Admitting: Podiatry

## 2017-01-19 ENCOUNTER — Ambulatory Visit (INDEPENDENT_AMBULATORY_CARE_PROVIDER_SITE_OTHER): Payer: Medicare Other | Admitting: Podiatry

## 2017-01-19 DIAGNOSIS — L84 Corns and callosities: Secondary | ICD-10-CM | POA: Diagnosis not present

## 2017-01-19 DIAGNOSIS — M2042 Other hammer toe(s) (acquired), left foot: Secondary | ICD-10-CM | POA: Diagnosis not present

## 2017-01-21 NOTE — Progress Notes (Signed)
Subjective:    Patient ID: Danielle Dean, female   DOB: 83 y.o.   MRN: [id number]   HPI patient presents with caregiver with chronic lesion digit 5 left with numerous questions about surgical intervention in this case    ROS      Objective:  Physical Exam neurovascular status unchanged with patient noted to have distal lateral keratotic lesion digit 5 left with rotation of the toe and pain with ambulation and shoe gear     Assessment:  Hammertoe deformity digit 5 left with keratotic lesion that's quite enlarged and thick on the distal lateral portion       Plan:  H&P condition reviewed and discussed. At this point I have recommended at one point derotational distal arthroplasty along with distal lateral exostectomy and I educated family to this condition. Today debridement accomplished which will be done as needed

## 2017-02-05 NOTE — Progress Notes (Signed)
Office Visit Note  Patient: Danielle Dean             Date of Birth: Sep 01, 1928           MRN: [id number]             PCP: Georgann Housekeeper, MD Referring: Georgann Housekeeper, MD Visit Date: 02/10/2017 Occupation: @GUAROCC @    Subjective:  No chief complaint on file.   History of Present Illness: Danielle Dean is a 83 y.o. female with osteoarthritis involving hands and knee joints. She states she had right knee joint cortisone injection by her daughter who is a rheumatologist on August 1 week. The joint pain has improved but is still causing discomfort. Other joints are doing okay. She has right total shoulder replacement which is doing well as well.   Activities of Daily Living:  Patient reports morning stiffness for 10 minutes.   Patient Denies nocturnal pain.  Difficulty dressing/grooming: Denies Difficulty climbing stairs: Reports Difficulty getting out of chair: Reports Difficulty using hands for taps, buttons, cutlery, and/or writing: Denies   Review of Systems  Constitutional: Negative for fatigue, night sweats, weight gain, weight loss and weakness.  HENT: Negative for mouth sores, trouble swallowing, trouble swallowing, mouth dryness and nose dryness.   Eyes: Negative for pain, redness, visual disturbance and dryness.  Respiratory: Negative for cough, shortness of breath and difficulty breathing.   Cardiovascular: Negative for chest pain, palpitations, hypertension, irregular heartbeat and swelling in legs/feet.  Gastrointestinal: Negative for blood in stool, constipation and diarrhea.  Endocrine: Negative for increased urination.  Genitourinary: Negative for vaginal dryness.  Musculoskeletal: Positive for arthralgias, joint pain and morning stiffness. Negative for joint swelling, myalgias, muscle weakness, muscle tenderness and myalgias.  Skin: Negative for color change, rash, hair loss, skin tightness, ulcers and sensitivity to sunlight.  Allergic/Immunologic: Negative  for susceptible to infections.  Neurological: Negative for dizziness, memory loss and night sweats.  Hematological: Negative for swollen glands.  Psychiatric/Behavioral: Positive for sleep disturbance. Negative for depressed mood. The patient is nervous/anxious.     PMFS History:  Patient Active Problem List   Diagnosis Date Noted  . History of right shoulder replacement 10/07/2016  . Chronic pain of both knees 05/20/2016  . Osteoarthritis of both hands 05/20/2016  . Essential hypertension 05/20/2016  . Environmental allergies 05/20/2016  . Primary osteoarthritis of both knees 05/19/2016  . Callus of foot 11/24/2012  . Pain in joint, ankle and foot 09/15/2012    Past Medical History:  Diagnosis Date  . Callus   . Cataract fragments in both eyes following surgery   . Corns and callosities   . Hx of hysterectomy   . Hypertension     Family History  Problem Relation Age of Onset  . Arthritis Mother   . Diabetes Father    Past Surgical History:  Procedure Laterality Date  . ABDOMINAL HYSTERECTOMY    . NASAL SEPTUM SURGERY    . ROTATOR CUFF REPAIR Right    Social History   Social History Narrative  . No narrative on file     Objective: Vital Signs: BP 138/64 (BP Location: Left Arm, Patient Position: Sitting, Cuff Size: Normal)   Pulse (!) 57   Ht 5' (1.524 m)   Wt 109 lb (49.4 kg)   BMI 21.29 kg/m    Physical Exam  Constitutional: She is oriented to person, place, and time. She appears well-developed and well-nourished.  HENT:  Head: Normocephalic and atraumatic.  Eyes: Conjunctivae and  EOM are normal.  Neck: Normal range of motion.  Cardiovascular: Normal rate, regular rhythm, normal heart sounds and intact distal pulses.   Pulmonary/Chest: Effort normal and breath sounds normal.  Abdominal: Soft. Bowel sounds are normal.  Lymphadenopathy:    She has no cervical adenopathy.  Neurological: She is alert and oriented to person, place, and time.  Skin: Skin is  warm and dry. Capillary refill takes less than 2 seconds.  Psychiatric: She has a normal mood and affect. Her behavior is normal.  Nursing note and vitals reviewed.    Musculoskeletal Exam: C-spine and thoracic spine lumbar spine fairly good range of motion. She is some scoliosis. Right shoulder joint abduction is limited to 90. Left shoulder joint was full range of motion with some discomfort. Elbow joints wrist joint MCPs with good range of motion. She has DIP PIP thickening in her hands consistent with osteoarthritis. She has good range of motion of her hip joints and knee joints she is crepitus and discomfort in her right knee joint with range of motion.  CDAI Exam: No CDAI exam completed.    Investigation: No additional findings.    Imaging: No results found.  Speciality Comments: No specialty comments available.    Procedures:  No procedures performed Allergies: Patient has no known allergies.   Assessment / Plan:     Visit Diagnoses: Primary osteoarthritis of both hands: She has some osteoarthritic changes in her hands. Joint protection was discussed.  History of right shoulder replacement: Doing well  Primary osteoarthritis of both knees: She had right knee joint cortisone injection this month by her daughter was a rheumatologist. She is doing better now. She was to come back in 3 months to have repeat cortisone injection as recommended by her daughter.  History of hypertension : Her blood pressure is mildly elevated at advised her to monitor that.  Osteopenia: According to patient her bone densities are done by her PCP. Use of calcium and vitamin D and resistive exercises were discussed.   Orders: No orders of the defined types were placed in this encounter.  No orders of the defined types were placed in this encounter.   Follow-Up Instructions: Return in about 3 months (around 05/13/2017) for Osteoarthritis.   Pollyann Savoy, MD  Note - This record has  been created using Animal nutritionist.  Chart creation errors have been sought, but may not always  have been located. Such creation errors do not reflect on  the standard of medical care.

## 2017-02-06 ENCOUNTER — Telehealth: Payer: Self-pay | Admitting: Rheumatology

## 2017-02-06 NOTE — Telephone Encounter (Signed)
Patient states her daughter who is a rheumatologist gave her a knee injection on 01/20/17. Just an fyi. Patient has scheduled appt here on 02/10/17.

## 2017-02-08 NOTE — Telephone Encounter (Signed)
We would not be able to inject for at least 3 months.

## 2017-02-09 NOTE — Telephone Encounter (Signed)
Patient is being seen on 02/10/17 for ROV.

## 2017-02-10 ENCOUNTER — Ambulatory Visit (INDEPENDENT_AMBULATORY_CARE_PROVIDER_SITE_OTHER): Payer: Medicare Other | Admitting: Rheumatology

## 2017-02-10 ENCOUNTER — Encounter: Payer: Self-pay | Admitting: Rheumatology

## 2017-02-10 VITALS — BP 138/64 | HR 57 | Ht 60.0 in | Wt 109.0 lb

## 2017-02-10 DIAGNOSIS — M170 Bilateral primary osteoarthritis of knee: Secondary | ICD-10-CM | POA: Diagnosis not present

## 2017-02-10 DIAGNOSIS — M19041 Primary osteoarthritis, right hand: Secondary | ICD-10-CM | POA: Diagnosis not present

## 2017-02-10 DIAGNOSIS — Z8679 Personal history of other diseases of the circulatory system: Secondary | ICD-10-CM

## 2017-02-10 DIAGNOSIS — Z96611 Presence of right artificial shoulder joint: Secondary | ICD-10-CM

## 2017-02-10 DIAGNOSIS — M19042 Primary osteoarthritis, left hand: Secondary | ICD-10-CM | POA: Diagnosis not present

## 2017-04-10 ENCOUNTER — Encounter: Payer: Self-pay | Admitting: Podiatry

## 2017-04-10 ENCOUNTER — Ambulatory Visit (INDEPENDENT_AMBULATORY_CARE_PROVIDER_SITE_OTHER): Payer: Medicare Other | Admitting: Podiatry

## 2017-04-10 DIAGNOSIS — M2042 Other hammer toe(s) (acquired), left foot: Secondary | ICD-10-CM

## 2017-04-10 DIAGNOSIS — L84 Corns and callosities: Secondary | ICD-10-CM | POA: Diagnosis not present

## 2017-04-15 ENCOUNTER — Ambulatory Visit: Payer: Medicare Other | Admitting: Podiatry

## 2017-04-15 NOTE — Progress Notes (Signed)
Subjective:    Patient ID: Danielle Dean, female   DOB: 83 y.o.   MRN: [id number]   HPI patient presents with caregiver with painful lesion fifth digit left foot    ROS      Objective:  Physical Exam neurovascular status intact with distal lateral keratotic lesion digit 5 left that's very painful when pressed     Assessment:    Lesion secondary to pressure fifth digit left     Plan:    Sterile debridement and again were try to hold off on surgery and I explained someday may be necessary

## 2017-06-28 NOTE — Progress Notes (Signed)
Office Visit Note  Patient: Danielle KehrKarma V Plascencia             Date of Birth: 07/19/1928           MRN: [id number]             PCP: Georgann HousekeeperHusain, Karrar, MD Referring: Georgann HousekeeperHusain, Karrar, MD Visit Date: 07/10/2017 Occupation: @GUAROCC @    Subjective:  Left wrist pain    History of Present Illness: Danielle Dean is a 84 y.o. female with history of osteoarthritis of hands and knees.  Patient states she continues to have discomfort in her right knee.  She states the pain worse after standing for long periods of time and going down stairs.  She would like another cortisone injection today.  She has pain in her left knee as well but not as severe.  She wears a right knee brace, which helps.   She states her main concern is her left wrist.  She states the pain has been worsening over the past 1 month.  She has been using her left hand more than her right due to favoring it for her right shoulder replacement.  She denies any injury.  She states that she has started wearing a wrist brace occasionally.    Activities of Daily Living:  Patient reports morning stiffness for 5 minutes.   Patient Denies nocturnal pain.  Difficulty dressing/grooming: Denies Difficulty climbing stairs: Reports Difficulty getting out of chair: Reports Difficulty using hands for taps, buttons, cutlery, and/or writing: Denies   Review of Systems  Constitutional: Negative for fatigue and weakness.  HENT: Negative for mouth sores, mouth dryness and nose dryness.   Eyes: Positive for dryness. Negative for redness and visual disturbance.  Respiratory: Negative for cough, hemoptysis, shortness of breath and difficulty breathing.   Cardiovascular: Negative for chest pain, palpitations, hypertension, irregular heartbeat and swelling in legs/feet.  Gastrointestinal: Negative for blood in stool, constipation and diarrhea.  Endocrine: Negative for increased urination.  Genitourinary: Negative for painful urination.  Musculoskeletal:  Positive for arthralgias, joint pain, joint swelling and morning stiffness. Negative for myalgias, muscle weakness, muscle tenderness and myalgias.  Skin: Negative for color change, pallor, rash, hair loss, nodules/bumps, redness, skin tightness, ulcers and sensitivity to sunlight.  Neurological: Negative for dizziness, numbness and headaches.  Hematological: Negative for swollen glands.  Psychiatric/Behavioral: Positive for sleep disturbance. Negative for depressed mood. The patient is not nervous/anxious.     PMFS History:  Patient Active Problem List   Diagnosis Date Noted  . History of right shoulder replacement 10/07/2016  . Chronic pain of both knees 05/20/2016  . Osteoarthritis of both hands 05/20/2016  . Essential hypertension 05/20/2016  . Environmental allergies 05/20/2016  . Primary osteoarthritis of both knees 05/19/2016  . Callus of foot 11/24/2012  . Pain in joint, ankle and foot 09/15/2012    Past Medical History:  Diagnosis Date  . Callus   . Cataract fragments in both eyes following surgery   . Corns and callosities   . Hx of hysterectomy   . Hypertension     Family History  Problem Relation Age of Onset  . Arthritis Mother   . Diabetes Father    Past Surgical History:  Procedure Laterality Date  . ABDOMINAL HYSTERECTOMY    . NASAL SEPTUM SURGERY    . ROTATOR CUFF REPAIR Right    Social History   Social History Narrative  . Not on file     Objective: Vital Signs: BP (!) 159/71 (BP  Location: Left Arm, Patient Position: Sitting, Cuff Size: Normal)   Pulse (!) 54   Resp 13   Ht 5' (1.524 m)   Wt 111 lb (50.3 kg)   BMI 21.68 kg/m    Physical Exam  Constitutional: She is oriented to person, place, and time. She appears well-developed and well-nourished.  HENT:  Head: Normocephalic and atraumatic.  Eyes: Conjunctivae and EOM are normal.  Neck: Normal range of motion.  Cardiovascular: Normal rate, regular rhythm, normal heart sounds and intact  distal pulses.  Pulmonary/Chest: Effort normal and breath sounds normal.  Abdominal: Soft. Bowel sounds are normal.  Lymphadenopathy:    She has no cervical adenopathy.  Neurological: She is alert and oriented to person, place, and time.  Skin: Skin is warm and dry. Capillary refill takes less than 2 seconds.  Psychiatric: She has a normal mood and affect. Her behavior is normal.  Nursing note and vitals reviewed.    Musculoskeletal Exam: C-spine, thoracic, and lumbar spine good ROM.  She had discomfort in her c-spine and lumbar with ROM.  Right shoulder limited forward flexion and internal rotation due to prior right reverse total arthroplasty.  Left shoulder full ROM.  Elbow joints good ROM.  Right wrist full ROM.  Left wrist slightly limited ROM due to discomfort.  MCPs, PIPs, and DIPs good ROM with no synovitis.  She has PIP and DIP synovial thickening consistent with osteoarthritis.  Hip joints, knee joints, ankle joints, MTPs, PIPs, and DIPs good ROM with no synovitis. Bilateral knee crepitus.  She has warmth of her right knee.  She has PIP and DIP synovial thickening consistent with osteoarthritis.  She has no midline spinal tenderness or SI joint tenderness. She has right trochanteric bursitis.     CDAI Exam: No CDAI exam completed.    Investigation: No additional findings.   Imaging: No results found.  Speciality Comments: No specialty comments available.    Procedures:  Large Joint Inj: R knee on 07/10/2017 11:56 AM Indications: pain Details: 27 G 1.5 in needle, medial approach  Arthrogram: No  Medications: 1.5 mL lidocaine 1 %; 40 mg triamcinolone acetonide 40 MG/ML Aspirate: 0 mL Outcome: tolerated well, no immediate complications Procedure, treatment alternatives, risks and benefits explained, specific risks discussed. Consent was given by the patient. Immediately prior to procedure a time out was called to verify the correct patient, procedure, equipment, support  staff and site/side marked as required. Patient was prepped and draped in the usual sterile fashion.     Allergies: Patient has no known allergies.   Assessment / Plan:     Visit Diagnoses: Primary osteoarthritis of both hands: She has PIP and DIP synovial thickening consistent with osteoarthritis.  She has no synovitis on exam.    Pain in left wrist: She has discomfort in her left wrist with ROM.  No synovitis on exam. She was advised to wear a wrist brace.  She can also use Voltaren gel in this area.  She was given a handout of wrist exercises she can perform at home.      Primary osteoarthritis of both knees: She has bilateral knee crepitus.  She has warmth of her right knee.  She had a right knee cortisone injection day.  She has osteoarthritic changes in her feet as well.  She was advised to wear proper fitting shoes.    History of right shoulder replacement: Doing well.  She has no discomfort.  She has some limitation in forward flexion and internal rotation.  Osteopenia of multiple sites - Per patient she sees her PCP for management of osteopenia.  She is on vitamin D 1,000 units daily and calcium.    History of hypertension: She was advised to monitor her blood pressure following the cortisone injection today.    Chronic pain of right knee - She has right knee crepitus and warmth.  No effusion. She requested a right knee cortisone injection today.  She tolerated the procedure well.  She was advised to monitor her blood pressure. Plan: Large Joint Inj: R knee    Orders: Orders Placed This Encounter  Procedures  . Large Joint Inj: R knee   No orders of the defined types were placed in this encounter.    Follow-Up Instructions: Return in about 6 months (around 01/07/2018) for Osteoarthritis.   Pollyann Savoy, MD  Note - This record has been created using Animal nutritionist.  Chart creation errors have been sought, but may not always  have been located. Such creation errors  do not reflect on  the standard of medical care.

## 2017-07-10 ENCOUNTER — Encounter: Payer: Self-pay | Admitting: Rheumatology

## 2017-07-10 ENCOUNTER — Ambulatory Visit (INDEPENDENT_AMBULATORY_CARE_PROVIDER_SITE_OTHER): Payer: Medicare Other | Admitting: Rheumatology

## 2017-07-10 VITALS — BP 159/71 | HR 54 | Resp 13 | Ht 60.0 in | Wt 111.0 lb

## 2017-07-10 DIAGNOSIS — G8929 Other chronic pain: Secondary | ICD-10-CM

## 2017-07-10 DIAGNOSIS — Z96611 Presence of right artificial shoulder joint: Secondary | ICD-10-CM | POA: Diagnosis not present

## 2017-07-10 DIAGNOSIS — M8589 Other specified disorders of bone density and structure, multiple sites: Secondary | ICD-10-CM | POA: Diagnosis not present

## 2017-07-10 DIAGNOSIS — M19041 Primary osteoarthritis, right hand: Secondary | ICD-10-CM

## 2017-07-10 DIAGNOSIS — M25561 Pain in right knee: Secondary | ICD-10-CM

## 2017-07-10 DIAGNOSIS — M170 Bilateral primary osteoarthritis of knee: Secondary | ICD-10-CM | POA: Diagnosis not present

## 2017-07-10 DIAGNOSIS — M19042 Primary osteoarthritis, left hand: Secondary | ICD-10-CM | POA: Diagnosis not present

## 2017-07-10 DIAGNOSIS — Z8679 Personal history of other diseases of the circulatory system: Secondary | ICD-10-CM | POA: Diagnosis not present

## 2017-07-10 MED ORDER — LIDOCAINE HCL 1 % IJ SOLN
1.5000 mL | INTRAMUSCULAR | Status: AC | PRN
  Administered 2017-07-10: 1.5 mL

## 2017-07-10 MED ORDER — TRIAMCINOLONE ACETONIDE 40 MG/ML IJ SUSP
40.0000 mg | INTRAMUSCULAR | Status: AC | PRN
  Administered 2017-07-10: 40 mg via INTRA_ARTICULAR

## 2017-07-22 ENCOUNTER — Encounter: Payer: Self-pay | Admitting: Podiatry

## 2017-07-22 ENCOUNTER — Ambulatory Visit (INDEPENDENT_AMBULATORY_CARE_PROVIDER_SITE_OTHER): Payer: Medicare Other | Admitting: Podiatry

## 2017-07-22 DIAGNOSIS — L84 Corns and callosities: Secondary | ICD-10-CM

## 2017-07-22 DIAGNOSIS — M21621 Bunionette of right foot: Secondary | ICD-10-CM | POA: Diagnosis not present

## 2017-07-22 NOTE — Progress Notes (Signed)
Subjective:   Patient ID: Danielle Dean, female   DOB: 84 y.o.   MRN: [id number]   HPI Patient presents with chronic lesion formation fifth digits of both feet and now has developed problems around the fifth metatarsal head right that she states is painful   ROS      Objective:  Physical Exam  Neurovascular status intact with distal lateral keratotic lesion digit 5 left is painful with keratotic lesion digit 5 right and inflammation pain around the fifth metatarsal head right foot     Assessment:  Chronic lesion formation with pain bilateral fifth digit with tailor's bunion deformity right     Plan:  H&P conditions reviewed debridement accomplished after numbing the fifth toe left and I discussed the tailor's bunion and do not recommend treatment unless it were to get worse and advised on wider shoes at the current time

## 2017-10-15 ENCOUNTER — Encounter: Payer: Self-pay | Admitting: Podiatry

## 2017-10-15 ENCOUNTER — Ambulatory Visit (INDEPENDENT_AMBULATORY_CARE_PROVIDER_SITE_OTHER): Payer: Medicare Other | Admitting: Podiatry

## 2017-10-15 DIAGNOSIS — L84 Corns and callosities: Secondary | ICD-10-CM

## 2017-10-15 DIAGNOSIS — M2042 Other hammer toe(s) (acquired), left foot: Secondary | ICD-10-CM | POA: Diagnosis not present

## 2017-10-15 DIAGNOSIS — M21621 Bunionette of right foot: Secondary | ICD-10-CM

## 2017-10-15 NOTE — Progress Notes (Signed)
Subjective:   Patient ID: Danielle Dean, female   DOB: 84 y.o.   MRN: [id number]   HPI Patient presents with significant digital deformity digit 5 left with tailor's bunion deformity also noted states is been sore and she would desperately like surgery presents with her daughter today but is primary caregiver for her husband   ROS      Objective:  Physical Exam  Neurovascular status intact with significant rotation digit 5 left with hypertrophy and prominence of the fifth metatarsal head also noted with keratotic tissue formation     Assessment:  Lesions that are secondary to pressure from hammertoe tailor's bunion deformity     Plan:  Educated her on this and will get a try to continue trimming but ultimately due to the increase in intensity discomfort probable surgical intervention is indicated.  I did anesthetize the fifth digit left I debrided the lesion debrided lesion plantarly and lesion on the fifth toe and patient will be seen back on an as-needed basis

## 2017-12-22 ENCOUNTER — Telehealth: Payer: Self-pay | Admitting: Rheumatology

## 2017-12-22 NOTE — Telephone Encounter (Signed)
Patient called requesting a return call checking the status of her referral to physical therapy.

## 2017-12-23 NOTE — Progress Notes (Deleted)
   Office Visit Note  Patient: Danielle Dean             Date of Birth: 11/13/1928           MRN: [id number]             PCP: Georgann HousekeeperHusain, Karrar, MD Referring: Georgann HousekeeperHusain, Karrar, MD Visit Date: 12/30/2017 Occupation: @GUAROCC @  Subjective:  No chief complaint on file.   History of Present Illness: Danielle Dean is a 84 y.o. female ***   Activities of Daily Living:  Patient reports morning stiffness for *** {minute/hour:19697}.   Patient {ACTIONS;DENIES/REPORTS:21021675::"Denies"} nocturnal pain.  Difficulty dressing/grooming: {ACTIONS;DENIES/REPORTS:21021675::"Denies"} Difficulty climbing stairs: {ACTIONS;DENIES/REPORTS:21021675::"Denies"} Difficulty getting out of chair: {ACTIONS;DENIES/REPORTS:21021675::"Denies"} Difficulty using hands for taps, buttons, cutlery, and/or writing: {ACTIONS;DENIES/REPORTS:21021675::"Denies"}  No Rheumatology ROS completed.   PMFS History:  Patient Active Problem List   Diagnosis Date Noted  . History of right shoulder replacement 10/07/2016  . Chronic pain of both knees 05/20/2016  . Osteoarthritis of both hands 05/20/2016  . Essential hypertension 05/20/2016  . Environmental allergies 05/20/2016  . Primary osteoarthritis of both knees 05/19/2016  . Callus of foot 11/24/2012  . Pain in joint, ankle and foot 09/15/2012    Past Medical History:  Diagnosis Date  . Callus   . Cataract fragments in both eyes following surgery   . Corns and callosities   . Hx of hysterectomy   . Hypertension     Family History  Problem Relation Age of Onset  . Arthritis Mother   . Diabetes Father    Past Surgical History:  Procedure Laterality Date  . ABDOMINAL HYSTERECTOMY    . NASAL SEPTUM SURGERY    . ROTATOR CUFF REPAIR Right    Social History   Social History Narrative  . Not on file    Objective: Vital Signs: There were no vitals taken for this visit.   Physical Exam   Musculoskeletal Exam: ***  CDAI Exam: No CDAI exam completed.    Investigation: No additional findings.  Imaging: No results found.  Recent Labs: No results found for: WBC, HGB, PLT, NA, K, CL, CO2, GLUCOSE, BUN, CREATININE, BILITOT, ALKPHOS, AST, ALT, PROT, ALBUMIN, CALCIUM, GFRAA  Speciality Comments: No specialty comments available.  Procedures:  No procedures performed Allergies: Patient has no known allergies.   Assessment / Plan:     Visit Diagnoses: No diagnosis found.   Orders: No orders of the defined types were placed in this encounter.  No orders of the defined types were placed in this encounter.   Face-to-face time spent with patient was *** minutes. Greater than 50% of time was spent in counseling and coordination of care.  Follow-Up Instructions: No follow-ups on file.   Danielle Dean, CMA  Note - This record has been created using Animal nutritionistDragon software.  Chart creation errors have been sought, but may not always  have been located. Such creation errors do not reflect on  the standard of medical care.

## 2017-12-25 NOTE — Telephone Encounter (Signed)
Will discuss with patient at appointment on 12/30/17

## 2017-12-30 ENCOUNTER — Encounter: Payer: Self-pay | Admitting: Rheumatology

## 2017-12-30 ENCOUNTER — Ambulatory Visit: Payer: Medicare Other | Admitting: Rheumatology

## 2017-12-30 ENCOUNTER — Ambulatory Visit (INDEPENDENT_AMBULATORY_CARE_PROVIDER_SITE_OTHER): Payer: Medicare Other | Admitting: Rheumatology

## 2017-12-30 VITALS — BP 151/69 | HR 55 | Resp 12 | Ht 59.0 in | Wt 110.0 lb

## 2017-12-30 DIAGNOSIS — Z96611 Presence of right artificial shoulder joint: Secondary | ICD-10-CM

## 2017-12-30 DIAGNOSIS — M8589 Other specified disorders of bone density and structure, multiple sites: Secondary | ICD-10-CM

## 2017-12-30 DIAGNOSIS — M19042 Primary osteoarthritis, left hand: Secondary | ICD-10-CM

## 2017-12-30 DIAGNOSIS — Z8679 Personal history of other diseases of the circulatory system: Secondary | ICD-10-CM

## 2017-12-30 DIAGNOSIS — M19041 Primary osteoarthritis, right hand: Secondary | ICD-10-CM

## 2017-12-30 DIAGNOSIS — M170 Bilateral primary osteoarthritis of knee: Secondary | ICD-10-CM | POA: Diagnosis not present

## 2017-12-30 MED ORDER — LIDOCAINE HCL (PF) 1 % IJ SOLN
1.5000 mL | INTRAMUSCULAR | Status: AC | PRN
  Administered 2017-12-30: 1.5 mL

## 2017-12-30 MED ORDER — TRIAMCINOLONE ACETONIDE 40 MG/ML IJ SUSP
40.0000 mg | INTRAMUSCULAR | Status: AC | PRN
  Administered 2017-12-30: 40 mg via INTRA_ARTICULAR

## 2017-12-30 NOTE — Progress Notes (Signed)
Office Visit Note  Patient: Danielle Dean             Date of Birth: 01/02/1929           MRN: [id number]             PCP: Georgann HousekeeperHusain, Karrar, MD Referring: Georgann HousekeeperHusain, Karrar, MD Visit Date: 12/30/2017 Occupation: @GUAROCC @  Subjective:  Pain in bilateral knees and bilateral hands.  History of Present Illness: Danielle Dean is a 84 y.o. female with history of osteoarthritis and osteopenia.  According to patient she has been having increased pain and discomfort in her bilateral knee joints.  The right knee joint has been more painful than the left.  She continues to have some stiffness in her hands.  She does not have much discomfort in her shoulder at this point.  Activities of Daily Living:  Patient reports morning stiffness for 24 hours.   Patient Denies nocturnal pain.  Difficulty dressing/grooming: Denies Difficulty climbing stairs: Reports Difficulty getting out of chair: Denies Difficulty using hands for taps, buttons, cutlery, and/or writing: Reports  Review of Systems  Constitutional: Negative for fatigue.  HENT: Negative for mouth dryness.   Eyes: Negative for dryness.  Respiratory: Negative for shortness of breath and difficulty breathing.   Cardiovascular: Negative for chest pain and swelling in legs/feet.  Gastrointestinal: Negative for abdominal pain and diarrhea.  Endocrine: Negative for increased urination.  Genitourinary: Negative for pelvic pain.  Musculoskeletal: Positive for arthralgias, joint pain and morning stiffness. Negative for joint swelling.  Skin: Negative for rash.  Allergic/Immunologic: Negative for susceptible to infections.  Neurological: Negative for headaches and memory loss.  Hematological: Negative for bruising/bleeding tendency.  Psychiatric/Behavioral: Negative for confusion.    PMFS History:  Patient Active Problem List   Diagnosis Date Noted  . History of right shoulder replacement 10/07/2016  . Chronic pain of both knees 05/20/2016    . Osteoarthritis of both hands 05/20/2016  . Essential hypertension 05/20/2016  . Environmental allergies 05/20/2016  . Primary osteoarthritis of both knees 05/19/2016  . Callus of foot 11/24/2012  . Pain in joint, ankle and foot 09/15/2012    Past Medical History:  Diagnosis Date  . Callus   . Cataract fragments in both eyes following surgery   . Corns and callosities   . Hx of hysterectomy   . Hypertension     Family History  Problem Relation Age of Onset  . Arthritis Mother   . Diabetes Father    Past Surgical History:  Procedure Laterality Date  . ABDOMINAL HYSTERECTOMY    . NASAL SEPTUM SURGERY    . ROTATOR CUFF REPAIR Right    Social History   Social History Narrative  . Not on file    Objective: Vital Signs: BP (!) 151/69 (BP Location: Left Arm, Patient Position: Sitting, Cuff Size: Normal)   Pulse (!) 55   Resp 12   Ht 4\' 11"  (1.499 m)   Wt 110 lb (49.9 kg)   BMI 22.22 kg/m    Physical Exam  Constitutional: She is oriented to person, place, and time. She appears well-developed and well-nourished.  HENT:  Head: Normocephalic and atraumatic.  Eyes: Conjunctivae and EOM are normal.  Neck: Normal range of motion.  Cardiovascular: Normal rate, regular rhythm, normal heart sounds and intact distal pulses.  Pulmonary/Chest: Effort normal and breath sounds normal.  Abdominal: Soft. Bowel sounds are normal.  Lymphadenopathy:    She has no cervical adenopathy.  Neurological: She is alert and  oriented to person, place, and time.  Skin: Skin is warm and dry. Capillary refill takes less than 2 seconds.  Psychiatric: She has a normal mood and affect. Her behavior is normal.  Nursing note and vitals reviewed.    Musculoskeletal Exam: C-spine thoracic spine lumbar spine good range of motion.  She has thoracic kyphosis.  She has some limitation with range of motion of her right shoulder which is been replaced.  She has DIP PIP and CMC thickening in her hands  consistent with osteoarthritis.  Hip joints knee joints ankles MTPs PIPs were in good range of motion.  He has discomfort range of motion of bilateral knee joints and crepitus.  CDAI Exam: No CDAI exam completed.   Investigation: No additional findings.  Imaging: No results found.  Recent Labs: No results found for: WBC, HGB, PLT, NA, K, CL, CO2, GLUCOSE, BUN, CREATININE, BILITOT, ALKPHOS, AST, ALT, PROT, ALBUMIN, CALCIUM, GFRAA, QFTBGOLD, QFTBGOLDPLUS  Speciality Comments: No specialty comments available.  Procedures:  Large Joint Inj: R knee on 12/30/2017 12:59 PM Indications: pain Details: 27 G 1.5 in needle, medial approach  Arthrogram: No  Medications: 1.5 mL lidocaine (PF) 1 %; 40 mg triamcinolone acetonide 40 MG/ML Aspirate: 0 mL Outcome: tolerated well, no immediate complications Procedure, treatment alternatives, risks and benefits explained, specific risks discussed. Consent was given by the patient. Immediately prior to procedure a time out was called to verify the correct patient, procedure, equipment, support staff and site/side marked as required. Patient was prepped and draped in the usual sterile fashion.     Allergies: Patient has no known allergies.   Assessment / Plan:     Visit Diagnoses: Primary osteoarthritis of both hands-continues to have pain stiffness and discomfort in her bilateral hands.  Joint protection muscle strengthening was discussed.  Primary osteoarthritis of both knees-she has been having increased pain in bilateral knee joints.  Right knee joint was injected with cortisone as described above.  She states she will have left knee joint injected.  Her blood pressure was elevated we opted to do only one knee joint injection.  Osteopenia of multiple sites-DEXA is followed by her PCP.  History of right shoulder replacement  History of hypertension -I advised her to monitor blood pressure closely.  Orders: No orders of the defined types were  placed in this encounter.  No orders of the defined types were placed in this encounter.   Follow-Up Instructions: Return for Osteoarthritis.   Pollyann Savoy, MD  Note - This record has been created using Animal nutritionist.  Chart creation errors have been sought, but may not always  have been located. Such creation errors do not reflect on  the standard of medical care.

## 2018-02-11 ENCOUNTER — Ambulatory Visit (INDEPENDENT_AMBULATORY_CARE_PROVIDER_SITE_OTHER): Payer: Medicare Other | Admitting: Podiatry

## 2018-02-11 ENCOUNTER — Encounter: Payer: Self-pay | Admitting: Podiatry

## 2018-02-11 DIAGNOSIS — L84 Corns and callosities: Secondary | ICD-10-CM | POA: Diagnosis not present

## 2018-02-11 DIAGNOSIS — M2042 Other hammer toe(s) (acquired), left foot: Secondary | ICD-10-CM

## 2018-02-11 NOTE — Progress Notes (Signed)
Subjective:   Patient ID: Danielle Dean, female   DOB: 84 y.o.   MRN: [id number]   HPI Patient presents with severe lesions on the outer side of the fifth digit bilateral with distal keratotic lesions that are very painful and lesions underneath the foot that are painful and states she knows at one point surgery will be necessary   ROS      Objective:  Physical Exam  Neurovascular status intact with digital deformities of the fifth digit bilateral with hammertoe deformities that are painful when palpated with distal keratotic lesion     Assessment:  Chronic hammertoe deformity with keratotic lesion     Plan:  Reviewed the deformity and hammertoes which were I do not recommend correction currently and I debrided lesions which was tolerated well and reappoint as needed

## 2018-03-11 NOTE — Progress Notes (Signed)
Office Visit Note  Patient: Danielle Dean             Date of Birth: 08-01-1928           MRN: [id number]             PCP: Georgann Housekeeper, MD Referring: Georgann Housekeeper, MD Visit Date: 03/23/2018 Occupation: @GUAROCC @  Subjective:  Other (BIL knee, left arm pain )   History of Present Illness: Danielle Dean is a 84 y.o. female with history of osteoarthritis.  She states she continues to have pain and discomfort in her bilateral knee joints.  She has been also having discomfort in her left shoulder.  He denies any difficulty sleeping on her side.  She denies any joint swelling.  Activities of Daily Living:  Patient reports morning stiffness for 0 minute.   Patient Denies nocturnal pain.  Difficulty dressing/grooming: Denies Difficulty climbing stairs: Reports Difficulty getting out of chair: Denies Difficulty using hands for taps, buttons, cutlery, and/or writing: Denies  Review of Systems  Constitutional: Negative for fatigue, night sweats, weight gain and weight loss.  HENT: Negative for mouth sores, trouble swallowing, trouble swallowing, mouth dryness and nose dryness.   Eyes: Positive for dryness. Negative for pain, redness and visual disturbance.  Respiratory: Negative for cough, shortness of breath and difficulty breathing.   Cardiovascular: Negative for chest pain, palpitations, hypertension, irregular heartbeat and swelling in legs/feet.  Gastrointestinal: Negative for blood in stool, constipation and diarrhea.  Endocrine: Negative for increased urination.  Genitourinary: Negative for vaginal dryness.  Musculoskeletal: Positive for arthralgias and joint pain. Negative for joint swelling, myalgias, muscle weakness, morning stiffness, muscle tenderness and myalgias.  Skin: Negative for color change, rash, hair loss, skin tightness, ulcers and sensitivity to sunlight.  Allergic/Immunologic: Negative for susceptible to infections.  Neurological: Negative for dizziness,  memory loss, night sweats and weakness.  Hematological: Negative for swollen glands.  Psychiatric/Behavioral: Negative for depressed mood and sleep disturbance. The patient is not nervous/anxious.     PMFS History:  Patient Active Problem List   Diagnosis Date Noted  . History of right shoulder replacement 10/07/2016  . Chronic pain of both knees 05/20/2016  . Osteoarthritis of both hands 05/20/2016  . Essential hypertension 05/20/2016  . Environmental allergies 05/20/2016  . Primary osteoarthritis of both knees 05/19/2016  . Callus of foot 11/24/2012  . Pain in joint, ankle and foot 09/15/2012    Past Medical History:  Diagnosis Date  . Callus   . Cataract fragments in both eyes following surgery   . Corns and callosities   . Hx of hysterectomy   . Hypertension     Family History  Problem Relation Age of Onset  . Arthritis Mother   . Diabetes Father    Past Surgical History:  Procedure Laterality Date  . ABDOMINAL HYSTERECTOMY    . NASAL SEPTUM SURGERY    . ROTATOR CUFF REPAIR Right    Social History   Social History Narrative  . Not on file    Objective: Vital Signs: BP (!) 144/67 (BP Location: Left Arm, Patient Position: Sitting, Cuff Size: Normal)   Pulse (!) 57   Resp 12   Ht 4\' 11"  (1.499 m)   Wt 107 lb 12.8 oz (48.9 kg)   BMI 21.77 kg/m    Physical Exam  Constitutional: She is oriented to person, place, and time. She appears well-developed and well-nourished.  HENT:  Head: Normocephalic and atraumatic.  Eyes: Conjunctivae and EOM are normal.  Neck: Normal range of motion.  Cardiovascular: Normal rate, regular rhythm, normal heart sounds and intact distal pulses.  Pulmonary/Chest: Effort normal and breath sounds normal.  Abdominal: Soft. Bowel sounds are normal.  Lymphadenopathy:    She has no cervical adenopathy.  Neurological: She is alert and oriented to person, place, and time.  Skin: Skin is warm and dry. Capillary refill takes less than 2  seconds.  Psychiatric: She has a normal mood and affect. Her behavior is normal.  Nursing note and vitals reviewed.    Musculoskeletal Exam: C-spine limited range of motion.  Thoracic and lumbar spine good range of motion.  She discomfort range of motion of her lumbar spine.  She describes left-sided radiculopathy with forward flexion.  Shoulder joints elbow joints in good range of motion.  She has some DIP and PIP thickening in her hands consistent with osteoarthritis.  She has crepitus in her knee joints without any warmth swelling or effusion.  Ankle joints MTPs PIPs were in good range of motion.  CDAI Exam: CDAI Score: Not documented Patient Global Assessment: Not documented; Provider Global Assessment: Not documented Swollen: Not documented; Tender: Not documented Joint Exam   Not documented   There is currently no information documented on the homunculus. Go to the Rheumatology activity and complete the homunculus joint exam.  Investigation: No additional findings.  Imaging: No results found.  Recent Labs: No results found for: WBC, HGB, PLT, NA, K, CL, CO2, GLUCOSE, BUN, CREATININE, BILITOT, ALKPHOS, AST, ALT, PROT, ALBUMIN, CALCIUM, GFRAA, QFTBGOLD, QFTBGOLDPLUS  Speciality Comments: No specialty comments available.  Procedures:  Large Joint Inj: L glenohumeral on 03/23/2018 10:07 AM Indications: pain Details: 27 G 1.5 in needle, posterior approach  Arthrogram: No  Medications: 1 mL lidocaine 1 %; 40 mg triamcinolone acetonide 40 MG/ML Aspirate: 0 mL Outcome: tolerated well, no immediate complications Procedure, treatment alternatives, risks and benefits explained, specific risks discussed. Consent was given by the patient. Immediately prior to procedure a time out was called to verify the correct patient, procedure, equipment, support staff and site/side marked as required. Patient was prepped and draped in the usual sterile fashion.   Large Joint Inj: R knee on  03/23/2018 10:10 AM Indications: pain Details: 27 G 1.5 in needle, medial approach  Arthrogram: No  Medications: 40 mg triamcinolone acetonide 40 MG/ML; 1.5 mL lidocaine 1 % Aspirate: 0 mL Outcome: tolerated well, no immediate complications Procedure, treatment alternatives, risks and benefits explained, specific risks discussed. Consent was given by the patient. Immediately prior to procedure a time out was called to verify the correct patient, procedure, equipment, support staff and site/side marked as required. Patient was prepped and draped in the usual sterile fashion.     Allergies: Patient has no known allergies.   Assessment / Plan:     Visit Diagnoses: Chronic left shoulder pain-she has had discomfort in her left shoulder off and on.  She has been having increased pain and discomfort currently.  Per her request after informed consent was obtained left shoulder joint was injected with cortisone which she tolerated well.  History of right shoulder replacement-she has limited range of motion but not much discomfort.  Primary osteoarthritis of both knees - right knee injected on 12/2017.Marland Kitchen  Patient requests cortisone injection to her right knee joint.  Frequent injections were discouraged.  She had no response to Visco supplement injections in the past.  Per her request right knee joint was injected with cortisone as described above.  She tolerated the procedure  well.  Primary osteoarthritis of both hands-joint protection muscle strengthening was discussed.  Osteopenia of multiple sites - DEXA is followed by her PCP.  History of hypertension -she has been advised to monitor blood pressure closely.  Orders: Orders Placed This Encounter  Procedures  . Large Joint Inj  . Large Joint Inj   No orders of the defined types were placed in this encounter.     Follow-Up Instructions: Return in about 3 months (around 06/23/2018) for Osteoarthritis.   Pollyann Savoy, MD  Note - This  record has been created using Animal nutritionist.  Chart creation errors have been sought, but may not always  have been located. Such creation errors do not reflect on  the standard of medical care.

## 2018-03-23 ENCOUNTER — Encounter: Payer: Self-pay | Admitting: Rheumatology

## 2018-03-23 ENCOUNTER — Ambulatory Visit (INDEPENDENT_AMBULATORY_CARE_PROVIDER_SITE_OTHER): Payer: Medicare Other | Admitting: Rheumatology

## 2018-03-23 VITALS — BP 144/67 | HR 57 | Resp 12 | Ht 59.0 in | Wt 107.8 lb

## 2018-03-23 DIAGNOSIS — Z8679 Personal history of other diseases of the circulatory system: Secondary | ICD-10-CM

## 2018-03-23 DIAGNOSIS — M8589 Other specified disorders of bone density and structure, multiple sites: Secondary | ICD-10-CM

## 2018-03-23 DIAGNOSIS — Z96611 Presence of right artificial shoulder joint: Secondary | ICD-10-CM

## 2018-03-23 DIAGNOSIS — M25512 Pain in left shoulder: Secondary | ICD-10-CM

## 2018-03-23 DIAGNOSIS — M19041 Primary osteoarthritis, right hand: Secondary | ICD-10-CM | POA: Diagnosis not present

## 2018-03-23 DIAGNOSIS — M19042 Primary osteoarthritis, left hand: Secondary | ICD-10-CM

## 2018-03-23 DIAGNOSIS — G8929 Other chronic pain: Secondary | ICD-10-CM | POA: Diagnosis not present

## 2018-03-23 DIAGNOSIS — M170 Bilateral primary osteoarthritis of knee: Secondary | ICD-10-CM | POA: Diagnosis not present

## 2018-03-23 MED ORDER — LIDOCAINE HCL 1 % IJ SOLN
1.0000 mL | INTRAMUSCULAR | Status: AC | PRN
  Administered 2018-03-23: 1 mL

## 2018-03-23 MED ORDER — LIDOCAINE HCL 1 % IJ SOLN
1.5000 mL | INTRAMUSCULAR | Status: AC | PRN
  Administered 2018-03-23: 1.5 mL

## 2018-03-23 MED ORDER — TRIAMCINOLONE ACETONIDE 40 MG/ML IJ SUSP
40.0000 mg | INTRAMUSCULAR | Status: AC | PRN
  Administered 2018-03-23: 40 mg via INTRA_ARTICULAR

## 2018-05-17 ENCOUNTER — Encounter: Payer: Self-pay | Admitting: Podiatry

## 2018-05-17 ENCOUNTER — Ambulatory Visit (INDEPENDENT_AMBULATORY_CARE_PROVIDER_SITE_OTHER): Payer: Medicare Other | Admitting: Podiatry

## 2018-05-17 DIAGNOSIS — L84 Corns and callosities: Secondary | ICD-10-CM | POA: Diagnosis not present

## 2018-05-18 NOTE — Progress Notes (Signed)
Subjective:   Patient ID: Danielle Dean, female   DOB: 84 y.o.   MRN: [id number]   HPI Patient presents with chronic lesions of plantar aspect of both feet   ROS      Objective:  Physical Exam  Neurovascular status intact with keratotic lesion plantar aspect both the     Assessment:  Chronic lesion formation with pain bilateral     Plan:  Debridement of lesions bilateral with no iatrogenic bleeding noted

## 2018-06-17 NOTE — Progress Notes (Deleted)
   Office Visit Note  Patient: Danielle Dean             Date of Birth: [birth year]           MRN: [id number]             PCP: Georgann Housekeeper, MD Referring: Georgann Housekeeper, MD Visit Date: 07/01/2018 Occupation: @GUAROCC @  Subjective:  No chief complaint on file.   History of Present Illness: Danielle Dean is a 85 y.o. female ***   Activities of Daily Living:  Patient reports morning stiffness for *** {minute/hour:19697}.   Patient {ACTIONS;DENIES/REPORTS:21021675::"Denies"} nocturnal pain.  Difficulty dressing/grooming: {ACTIONS;DENIES/REPORTS:21021675::"Denies"} Difficulty climbing stairs: {ACTIONS;DENIES/REPORTS:21021675::"Denies"} Difficulty getting out of chair: {ACTIONS;DENIES/REPORTS:21021675::"Denies"} Difficulty using hands for taps, buttons, cutlery, and/or writing: {ACTIONS;DENIES/REPORTS:21021675::"Denies"}  No Rheumatology ROS completed.   PMFS History:  Patient Active Problem List   Diagnosis Date Noted  . History of right shoulder replacement 10/07/2016  . Chronic pain of both knees 05/20/2016  . Osteoarthritis of both hands 05/20/2016  . Essential hypertension 05/20/2016  . Environmental allergies 05/20/2016  . Primary osteoarthritis of both knees 05/19/2016  . Callus of foot 11/24/2012  . Pain in joint, ankle and foot 09/15/2012    Past Medical History:  Diagnosis Date  . Callus   . Cataract fragments in both eyes following surgery   . Corns and callosities   . Hx of hysterectomy   . Hypertension     Family History  Problem Relation Age of Onset  . Arthritis Mother   . Diabetes Father    Past Surgical History:  Procedure Laterality Date  . ABDOMINAL HYSTERECTOMY    . NASAL SEPTUM SURGERY    . ROTATOR CUFF REPAIR Right    Social History   Social History Narrative  . Not on file    Objective: Vital Signs: There were no vitals taken for this visit.   Physical Exam   Musculoskeletal Exam: ***  CDAI Exam: CDAI Score: Not  documented Patient Global Assessment: Not documented; Provider Global Assessment: Not documented Swollen: Not documented; Tender: Not documented Joint Exam   Not documented   There is currently no information documented on the homunculus. Go to the Rheumatology activity and complete the homunculus joint exam.  Investigation: No additional findings.  Imaging: No results found.  Recent Labs: No results found for: WBC, HGB, PLT, NA, K, CL, CO2, GLUCOSE, BUN, CREATININE, BILITOT, ALKPHOS, AST, ALT, PROT, ALBUMIN, CALCIUM, GFRAA, QFTBGOLD, QFTBGOLDPLUS  Speciality Comments: No specialty comments available.  Procedures:  No procedures performed Allergies: Patient has no known allergies.   Assessment / Plan:     Visit Diagnoses: No diagnosis found.   Orders: No orders of the defined types were placed in this encounter.  No orders of the defined types were placed in this encounter.   Face-to-face time spent with patient was *** minutes. Greater than 50% of time was spent in counseling and coordination of care.  Follow-Up Instructions: No follow-ups on file.   Ellen Henri, CMA  Note - This record has been created using Animal nutritionist.  Chart creation errors have been sought, but may not always  have been located. Such creation errors do not reflect on  the standard of medical care.

## 2018-07-01 ENCOUNTER — Ambulatory Visit: Payer: Medicare Other | Admitting: Rheumatology

## 2018-08-24 NOTE — Progress Notes (Signed)
Office Visit Note  Patient: Danielle Dean             Date of Birth: [birth year]           MRN: [id number]             PCP: Georgann Housekeeper, MD Referring: Georgann Housekeeper, MD Visit Date: 08/31/2018 Occupation: @GUAROCC @  Subjective:  Other (BIL knee pain )   History of Present Illness: Danielle Dean is a 85 y.o. female with history of osteoarthritis.  She has been experiencing increased pain in her knee joint since January.  Has any joint swelling.  She experiences some discomfort in her shoulder joints.  Activities of Daily Living:  Patient reports morning stiffness for 24 hours.   Patient Denies nocturnal pain.  Difficulty dressing/grooming: Denies Difficulty climbing stairs: Denies Difficulty getting out of chair: Reports Difficulty using hands for taps, buttons, cutlery, and/or writing: Reports  Review of Systems  Constitutional: Negative for fatigue.  HENT: Negative for mouth dryness and nose dryness.   Eyes: Negative for itching and dryness.  Respiratory: Negative for shortness of breath, wheezing and difficulty breathing.   Cardiovascular: Negative for chest pain, palpitations and irregular heartbeat.  Gastrointestinal: Negative for abdominal pain, constipation and diarrhea.  Endocrine: Negative for increased urination.  Genitourinary: Negative for painful urination and pelvic pain.  Musculoskeletal: Positive for arthralgias, joint pain and morning stiffness. Negative for joint swelling.  Skin: Negative for rash and redness.  Allergic/Immunologic: Negative for susceptible to infections.  Neurological: Negative for dizziness, light-headedness, headaches and weakness.  Hematological: Negative for swollen glands.  Psychiatric/Behavioral: Positive for sleep disturbance. The patient is not nervous/anxious.     PMFS History:  Patient Active Problem List   Diagnosis Date Noted  . History of right shoulder replacement 10/07/2016  . Chronic pain of both knees 05/20/2016   . Osteoarthritis of both hands 05/20/2016  . Essential hypertension 05/20/2016  . Environmental allergies 05/20/2016  . Primary osteoarthritis of both knees 05/19/2016  . Callus of foot 11/24/2012  . Pain in joint, ankle and foot 09/15/2012    Past Medical History:  Diagnosis Date  . Callus   . Cataract fragments in both eyes following surgery   . Corns and callosities   . Hx of hysterectomy   . Hypertension     Family History  Problem Relation Age of Onset  . Arthritis Mother   . Diabetes Father    Past Surgical History:  Procedure Laterality Date  . ABDOMINAL HYSTERECTOMY    . NASAL SEPTUM SURGERY    . ROTATOR CUFF REPAIR Right    Social History   Social History Narrative  . Not on file    There is no immunization history on file for this patient.   Objective: Vital Signs: BP 125/63 (BP Location: Left Arm, Patient Position: Sitting, Cuff Size: Normal)   Pulse 62   Resp 12   Ht 5' (1.524 m)   Wt 108 lb (49 kg)   BMI 21.09 kg/m    Physical Exam Vitals signs and nursing note reviewed.  Constitutional:      Appearance: She is well-developed.  HENT:     Head: Normocephalic and atraumatic.  Eyes:     Conjunctiva/sclera: Conjunctivae normal.  Neck:     Musculoskeletal: Normal range of motion.  Cardiovascular:     Rate and Rhythm: Normal rate and regular rhythm.     Heart sounds: Normal heart sounds.  Pulmonary:     Effort: Pulmonary effort  is normal.     Breath sounds: Normal breath sounds.  Abdominal:     General: Bowel sounds are normal.     Palpations: Abdomen is soft.  Lymphadenopathy:     Cervical: No cervical adenopathy.  Skin:    General: Skin is warm and dry.     Capillary Refill: Capillary refill takes less than 2 seconds.  Neurological:     Mental Status: She is alert and oriented to person, place, and time.  Psychiatric:        Behavior: Behavior normal.      Musculoskeletal Exam: C-spine range of motion.  She has some discomfort range  of motion of her left shoulder joint.  Right shoulder joint is replaced.  Elbow joints wrist joints with good range of motion.  She has PIP and DIP thickening in her hands.  Hip joints with good range of motion.  She has crepitus in both knee joints without any warmth swelling or effusion.  CDAI Exam: CDAI Score: Not documented Patient Global Assessment: Not documented; Provider Global Assessment: Not documented Swollen: Not documented; Tender: Not documented Joint Exam   Not documented   There is currently no information documented on the homunculus. Go to the Rheumatology activity and complete the homunculus joint exam.  Investigation: No additional findings.  Imaging: No results found.  Recent Labs: No results found for: WBC, HGB, PLT, NA, K, CL, CO2, GLUCOSE, BUN, CREATININE, BILITOT, ALKPHOS, AST, ALT, PROT, ALBUMIN, CALCIUM, GFRAA, QFTBGOLD, QFTBGOLDPLUS  Speciality Comments: No specialty comments available.  Procedures:  Large Joint Inj: bilateral knee on 08/31/2018 2:22 PM Indications: pain Details: 27 G 1.5 in needle, medial approach  Arthrogram: No  Medications (Right): 1.5 mL lidocaine 1 %; 40 mg triamcinolone acetonide 40 MG/ML Medications (Left): 1.5 mL lidocaine 1 %; 40 mg triamcinolone acetonide 40 MG/ML Outcome: tolerated well, no immediate complications Procedure, treatment alternatives, risks and benefits explained, specific risks discussed. Consent was given by the patient. Immediately prior to procedure a time out was called to verify the correct patient, procedure, equipment, support staff and site/side marked as required. Patient was prepped and draped in the usual sterile fashion.     Allergies: Patient has no known allergies.   Assessment / Plan:     Visit Diagnoses: Chronic pain of both knees -she is having pain and discomfort in her bilateral knee joints.  No warmth swelling or effusion was noted.  Per her request bilateral knee joints were injected  with cortisone as described above.  She tolerated the procedure well.  Postprocedure instructions were given.  Plan: Large Joint Inj: bilateral knee  Primary osteoarthritis of both knees-chronic pain  Primary osteoarthritis of both hands-joint protection muscle strengthening discussed.  Chronic left shoulder pain  History of right shoulder replacement-she continues to have some discomfort.  Osteopenia of multiple sites - DEXA followed by PCP  History of hypertension -blood pressure is well controlled.  Orders: Orders Placed This Encounter  Procedures  . Large Joint Inj: bilateral knee   No orders of the defined types were placed in this encounter.     Follow-Up Instructions: Return in about 4 months (around 12/31/2018) for Osteoarthritis.   Pollyann Savoy, MD  Note - This record has been created using Animal nutritionist.  Chart creation errors have been sought, but may not always  have been located. Such creation errors do not reflect on  the standard of medical care.

## 2018-08-31 ENCOUNTER — Encounter: Payer: Self-pay | Admitting: Rheumatology

## 2018-08-31 ENCOUNTER — Ambulatory Visit (INDEPENDENT_AMBULATORY_CARE_PROVIDER_SITE_OTHER): Payer: Medicare Other | Admitting: Rheumatology

## 2018-08-31 ENCOUNTER — Other Ambulatory Visit: Payer: Self-pay

## 2018-08-31 VITALS — BP 125/63 | HR 62 | Resp 12 | Ht 60.0 in | Wt 108.0 lb

## 2018-08-31 DIAGNOSIS — M8589 Other specified disorders of bone density and structure, multiple sites: Secondary | ICD-10-CM

## 2018-08-31 DIAGNOSIS — M170 Bilateral primary osteoarthritis of knee: Secondary | ICD-10-CM | POA: Diagnosis not present

## 2018-08-31 DIAGNOSIS — M25512 Pain in left shoulder: Secondary | ICD-10-CM

## 2018-08-31 DIAGNOSIS — M19041 Primary osteoarthritis, right hand: Secondary | ICD-10-CM

## 2018-08-31 DIAGNOSIS — Z96611 Presence of right artificial shoulder joint: Secondary | ICD-10-CM

## 2018-08-31 DIAGNOSIS — G8929 Other chronic pain: Secondary | ICD-10-CM

## 2018-08-31 DIAGNOSIS — Z8679 Personal history of other diseases of the circulatory system: Secondary | ICD-10-CM

## 2018-08-31 DIAGNOSIS — M25561 Pain in right knee: Secondary | ICD-10-CM

## 2018-08-31 DIAGNOSIS — M25562 Pain in left knee: Secondary | ICD-10-CM

## 2018-08-31 DIAGNOSIS — M19042 Primary osteoarthritis, left hand: Secondary | ICD-10-CM

## 2018-08-31 MED ORDER — LIDOCAINE HCL 1 % IJ SOLN
1.5000 mL | INTRAMUSCULAR | Status: AC | PRN
  Administered 2018-08-31: 1.5 mL

## 2018-08-31 MED ORDER — TRIAMCINOLONE ACETONIDE 40 MG/ML IJ SUSP
40.0000 mg | INTRAMUSCULAR | Status: AC | PRN
  Administered 2018-08-31: 40 mg via INTRA_ARTICULAR

## 2018-11-11 ENCOUNTER — Telehealth: Payer: Self-pay | Admitting: Rheumatology

## 2018-11-11 NOTE — Telephone Encounter (Signed)
Spoke with patient's daughter and advised patient would needs an appointment to update knee x-rays and to discuss visco . Appointment was scheduled for 11/25/18.

## 2018-11-11 NOTE — Telephone Encounter (Signed)
She will need to update x-rays of both knee joints prior to apply for visco injection.  Ok to schedule appointment for x-rays and to discuss visco.

## 2018-11-11 NOTE — Telephone Encounter (Signed)
Patient's daughter Danielle Dean called stating her mom has been experiencing a lot of pain in her knees and is asking if she might be a candidate for gel injections instead of cortisone.  Please call back at [phone]

## 2018-11-11 NOTE — Telephone Encounter (Signed)
Patient's daughter Danielle Dean states Danielle Dean has had cortisone injection and the are not being very effective for pain. Patient states she is only getting relief for about 1 week. Patient most recently had a cortisone injection on 09/03/18. Patient is interested in gel injections in bilateral knees. Please advise.

## 2018-11-15 NOTE — Progress Notes (Signed)
Office Visit Note  Patient: Danielle Dean             Date of Birth: 03-23-1929           MRN: [id number]             PCP: Georgann Housekeeper, MD Referring: Georgann Housekeeper, MD Visit Date: 11/25/2018 Occupation: @  Subjective:  Pain in both knees.   History of Present Illness: Danielle Dean is a 85 y.o. female with history of osteoarthritis.  She states she has been having pain and discomfort in her bilateral knee joints.  Her right knee joint gives out on her.  She has some discomfort in the left shoulder.  Her right shoulder joint has been replaced which is doing fairly well.  She also has severe osteoarthritis in her hands which bothers her.  She has done cortisone injections in the past with an adequate response.  She is interested in getting Visco supplement injections to her knee joints.  Activities of Daily Living:  Patient reports morning stiffness for 24 hours.   Patient Denies nocturnal pain.  Difficulty dressing/grooming: Denies Difficulty climbing stairs: Reports Difficulty getting out of chair: Reports Difficulty using hands for taps, buttons, cutlery, and/or writing: Reports  Review of Systems  Constitutional: Negative for fatigue, night sweats, weight gain and weight loss.  HENT: Negative for mouth sores, trouble swallowing, trouble swallowing, mouth dryness and nose dryness.   Eyes: Positive for dryness. Negative for pain, redness and visual disturbance.  Respiratory: Negative for cough, shortness of breath and difficulty breathing.   Cardiovascular: Negative for chest pain, palpitations, hypertension, irregular heartbeat and swelling in legs/feet.  Gastrointestinal: Negative for blood in stool, constipation and diarrhea.  Endocrine: Negative for cold intolerance and increased urination.  Genitourinary: Negative for painful urination and vaginal dryness.  Musculoskeletal: Positive for arthralgias, gait problem, joint pain and morning stiffness. Negative for  joint swelling, myalgias, muscle weakness, muscle tenderness and myalgias.  Skin: Negative for color change, rash, hair loss, skin tightness, ulcers and sensitivity to sunlight.  Allergic/Immunologic: Negative for susceptible to infections.  Neurological: Negative for dizziness, numbness, memory loss, night sweats and weakness.  Hematological: Negative for swollen glands.  Psychiatric/Behavioral: Positive for sleep disturbance. Negative for depressed mood. The patient is not nervous/anxious.     PMFS History:  Patient Active Problem List   Diagnosis Date Noted  . History of right shoulder replacement 10/07/2016  . Chronic pain of both knees 05/20/2016  . Osteoarthritis of both hands 05/20/2016  . Essential hypertension 05/20/2016  . Environmental allergies 05/20/2016  . Primary osteoarthritis of both knees 05/19/2016  . Callus of foot 11/24/2012  . Pain in joint, ankle and foot 09/15/2012    Past Medical History:  Diagnosis Date  . Callus   . Cataract fragments in both eyes following surgery   . Corns and callosities   . Hx of hysterectomy   . Hypertension     Family History  Problem Relation Age of Onset  . Arthritis Mother   . Diabetes Father    Past Surgical History:  Procedure Laterality Date  . ABDOMINAL HYSTERECTOMY    . NASAL SEPTUM SURGERY    . ROTATOR CUFF REPAIR Right   . TOTAL SHOULDER ARTHROPLASTY     Social History   Social History Narrative  . Not on file    There is no immunization history on file for this patient.   Objective: Vital Signs: BP (!) 153/76 (BP Location: Left Arm, Patient  Position: Sitting, Cuff Size: Normal)   Pulse (!) 56   Resp 18   Ht 5' (1.524 m)   Wt 107 lb 7.2 oz (48.7 kg)   BMI 20.98 kg/m    Physical Exam Vitals signs and nursing note reviewed.  Constitutional:      Appearance: She is well-developed.  HENT:     Head: Normocephalic and atraumatic.  Eyes:     Conjunctiva/sclera: Conjunctivae normal.  Neck:      Musculoskeletal: Normal range of motion.  Cardiovascular:     Rate and Rhythm: Normal rate and regular rhythm.     Heart sounds: Normal heart sounds.  Pulmonary:     Effort: Pulmonary effort is normal.     Breath sounds: Normal breath sounds.  Abdominal:     General: Bowel sounds are normal.     Palpations: Abdomen is soft.  Lymphadenopathy:     Cervical: No cervical adenopathy.  Skin:    General: Skin is warm and dry.     Capillary Refill: Capillary refill takes less than 2 seconds.  Neurological:     Mental Status: She is alert and oriented to person, place, and time.  Psychiatric:        Behavior: Behavior normal.      Musculoskeletal Exam: C-spine thoracic spine and lumbar spine fairly good range of motion.  She has thoracic kyphosis.  Shoulder joint abduction is limited on both sides.  Elbow joints wrist joints were in good range of motion.  She has DIP and PIP thickening and subluxation of some of the DIP joints consistent with osteoarthritis.  She has some warmth on palpation of her right knee joint.  Discomfort range of motion of bilateral knee joints.  No effusion was noted.  CDAI Exam: CDAI Score: - Patient Global: -; Provider Global: - Swollen: -; Tender: - Joint Exam   No joint exam has been documented for this visit   There is currently no information documented on the homunculus. Go to the Rheumatology activity and complete the homunculus joint exam.  Investigation: No additional findings.  Imaging: Xr Knee 3 View Left  Result Date: 11/25/2018 Severe medial compartment narrowing and severe patellofemoral narrowing was noted.  No chondrocalcinosis was noted. Impression: These findings are consistent with severe osteoarthritis and severe chondromalacia patella.  Xr Knee 3 View Right  Result Date: 11/25/2018 Severe lateral compartment narrowing with lateral osteophytes was noted.  Severe patellofemoral narrowing was noted. Impression: These findings are  consistent with severe osteoarthritis and severe chondromalacia patella.   Recent Labs: No results found for: WBC, HGB, PLT, NA, K, CL, CO2, GLUCOSE, BUN, CREATININE, BILITOT, ALKPHOS, AST, ALT, PROT, ALBUMIN, CALCIUM, GFRAA, QFTBGOLD, QFTBGOLDPLUS  Speciality Comments: No specialty comments available.  Procedures:  No procedures performed Allergies: Patient has no known allergies.   Assessment / Plan:     Visit Diagnoses: Chronic pain of both knees -patient has been having increased pain and discomfort in her bilateral knee joints.  She had warmth on palpation of her right knee joint.  No effusion was noted.  She has had cortisone injection in the past with an adequate response.  She is interested in getting Visco supplement injections.  Plan: XR KNEE 3 VIEW RIGHT, XR KNEE 3 VIEW LEFT, x-rays obtained today showed severe osteoarthritis and severe chondromalacia patella.  Per her request we will apply for Visco supplement injections.  Primary osteoarthritis of both knees - Plan: XR KNEE 3 VIEW RIGHT, XR KNEE 3 VIEW LEFT  Primary osteoarthritis  of both hands -she has severe osteoarthritis in her bilateral hands.  Plan: Joint protection muscle strengthening was discussed.  Chronic left shoulder pain -she has osteoarthritis in her left shoulder joint.  Plan: Range of motion exercises were discussed.  History of right shoulder replacement -she is doing fairly well.  She still has some discomfort.  Osteopenia of multiple sites - DEXA followed by PCP - Plan: Calcium and vitamin D supplement was discussed.  History of hypertension - Plan: Her systolic blood pressure is elevated.  Have advised her to monitor blood pressure and follow-up with her PCP.  Orders: Orders Placed This Encounter  Procedures  . XR KNEE 3 VIEW RIGHT  . XR KNEE 3 VIEW LEFT   No orders of the defined types were placed in this encounter.     Follow-Up Instructions: Return if symptoms worsen or fail to improve, for  Osteoarthritis.   Pollyann SavoyShaili Deveshwar, MD  Note - This record has been created using Animal nutritionistDragon software.  Chart creation errors have been sought, but may not always  have been located. Such creation errors do not reflect on  the standard of medical care.

## 2018-11-25 ENCOUNTER — Ambulatory Visit: Payer: Self-pay

## 2018-11-25 ENCOUNTER — Other Ambulatory Visit: Payer: Self-pay

## 2018-11-25 ENCOUNTER — Ambulatory Visit (INDEPENDENT_AMBULATORY_CARE_PROVIDER_SITE_OTHER): Payer: Medicare Other

## 2018-11-25 ENCOUNTER — Ambulatory Visit (INDEPENDENT_AMBULATORY_CARE_PROVIDER_SITE_OTHER): Payer: Medicare Other | Admitting: Rheumatology

## 2018-11-25 ENCOUNTER — Encounter: Payer: Self-pay | Admitting: Physician Assistant

## 2018-11-25 ENCOUNTER — Telehealth: Payer: Self-pay | Admitting: *Deleted

## 2018-11-25 VITALS — BP 153/76 | HR 56 | Resp 18 | Ht 60.0 in | Wt 107.5 lb

## 2018-11-25 DIAGNOSIS — M25562 Pain in left knee: Secondary | ICD-10-CM | POA: Diagnosis not present

## 2018-11-25 DIAGNOSIS — M25561 Pain in right knee: Secondary | ICD-10-CM | POA: Diagnosis not present

## 2018-11-25 DIAGNOSIS — Z8679 Personal history of other diseases of the circulatory system: Secondary | ICD-10-CM

## 2018-11-25 DIAGNOSIS — G8929 Other chronic pain: Secondary | ICD-10-CM

## 2018-11-25 DIAGNOSIS — M170 Bilateral primary osteoarthritis of knee: Secondary | ICD-10-CM | POA: Diagnosis not present

## 2018-11-25 DIAGNOSIS — M19041 Primary osteoarthritis, right hand: Secondary | ICD-10-CM | POA: Diagnosis not present

## 2018-11-25 DIAGNOSIS — M25512 Pain in left shoulder: Secondary | ICD-10-CM

## 2018-11-25 DIAGNOSIS — Z96611 Presence of right artificial shoulder joint: Secondary | ICD-10-CM

## 2018-11-25 DIAGNOSIS — M8589 Other specified disorders of bone density and structure, multiple sites: Secondary | ICD-10-CM

## 2018-11-25 DIAGNOSIS — M19042 Primary osteoarthritis, left hand: Secondary | ICD-10-CM

## 2018-11-25 NOTE — Telephone Encounter (Signed)
Please apply for visco for bilateral knees for Danielle Dean patient. Thank you.

## 2018-11-26 ENCOUNTER — Ambulatory Visit (INDEPENDENT_AMBULATORY_CARE_PROVIDER_SITE_OTHER): Payer: Medicare Other | Admitting: Podiatry

## 2018-11-26 ENCOUNTER — Telehealth: Payer: Self-pay

## 2018-11-26 ENCOUNTER — Other Ambulatory Visit: Payer: Self-pay

## 2018-11-26 ENCOUNTER — Encounter: Payer: Self-pay | Admitting: Podiatry

## 2018-11-26 DIAGNOSIS — Q828 Other specified congenital malformations of skin: Secondary | ICD-10-CM | POA: Diagnosis not present

## 2018-11-26 DIAGNOSIS — L84 Corns and callosities: Secondary | ICD-10-CM | POA: Diagnosis not present

## 2018-11-26 NOTE — Telephone Encounter (Signed)
Noted  

## 2018-11-26 NOTE — Progress Notes (Signed)
This patient presents to the office for treatment of her multiple calluses both feet.  She has previously been treated with debridement of these skin lesions.  She presents for preventative foot care services.  Vascular  Dorsalis pedis and posterior tibial pulses are palpable  B/L.  Capillary return  WNL.  Temperature gradient is  WNL.  Skin turgor  WNL  Sensorium  Senn Weinstein monofilament wire  WNL. Normal tactile sensation.  Nail Exam  Patient has normal nails with no evidence of bacterial or fungal infection.  Orthopedic  Exam  Muscle tone and muscle strength  WNL.  No limitations of motion feet  B/L.  No crepitus or joint effusion noted.  Foot type is unremarkable and digits show no abnormalities.  HAV  B/L.  Tailors bunion 5th MPJ  B/L.  Skin  No open lesions.  Normal skin texture and turgor.  [place] durum 5th toe left foot.  Listers corns fifth toes  B/l  Callus sub 5th met  B/l  Callus/Porokeratosis  B/L  Debride callus/porokeratosis  B/l.   Gardiner Barefoot DPM

## 2018-11-26 NOTE — Telephone Encounter (Signed)
Submitted VOB for Orthovisc, bilateral knee.  

## 2018-12-02 ENCOUNTER — Telehealth: Payer: Self-pay

## 2018-12-02 NOTE — Telephone Encounter (Signed)
Please schedule patient an appointment with Dr. Estanislado Pandy or Lovena Le for gel injection.  Thank You.  Approved for Orthovisc series, bilateral knee. Buy & Bill Covered at 100% through her insurance. No Co-pay No PA required

## 2018-12-09 ENCOUNTER — Other Ambulatory Visit: Payer: Self-pay

## 2018-12-09 ENCOUNTER — Ambulatory Visit (INDEPENDENT_AMBULATORY_CARE_PROVIDER_SITE_OTHER): Payer: Medicare Other | Admitting: Physician Assistant

## 2018-12-09 DIAGNOSIS — M170 Bilateral primary osteoarthritis of knee: Secondary | ICD-10-CM

## 2018-12-09 MED ORDER — HYALURONAN 30 MG/2ML IX SOSY
30.0000 mg | PREFILLED_SYRINGE | INTRA_ARTICULAR | Status: AC | PRN
  Administered 2018-12-09: 30 mg via INTRA_ARTICULAR

## 2018-12-09 MED ORDER — LIDOCAINE HCL 1 % IJ SOLN
1.5000 mL | INTRAMUSCULAR | Status: AC | PRN
  Administered 2018-12-09: 1.5 mL

## 2018-12-09 NOTE — Progress Notes (Signed)
   Procedure Note  Patient: Danielle Dean             Date of Birth: 1928/11/15           MRN: [id number]             Visit Date: 12/09/2018  Procedures: Visit Diagnoses:  1. Primary osteoarthritis of both knees    Orthovisc #1 Bilateral knee joint injections B/B  Large Joint Inj: bilateral knee on 12/09/2018 8:16 AM Indications: pain Details: 25 G 1.5 in needle, medial approach  Arthrogram: No  Medications (Right): 30 mg Hyaluronan 30 MG/2ML; 1.5 mL lidocaine 1 % Aspirate (Right): 0 mL Medications (Left): 30 mg Hyaluronan 30 MG/2ML; 1.5 mL lidocaine 1 % Aspirate (Left): 0 mL Outcome: tolerated well, no immediate complications Procedure, treatment alternatives, risks and benefits explained, specific risks discussed. Consent was given by the patient. Immediately prior to procedure a time out was called to verify the correct patient, procedure, equipment, support staff and site/side marked as required. Patient was prepped and draped in the usual sterile fashion.     Patient tolerated the procedure well. Hazel Sams, PA-C

## 2018-12-16 ENCOUNTER — Ambulatory Visit (INDEPENDENT_AMBULATORY_CARE_PROVIDER_SITE_OTHER): Payer: Medicare Other | Admitting: Rheumatology

## 2018-12-16 ENCOUNTER — Other Ambulatory Visit: Payer: Self-pay

## 2018-12-16 DIAGNOSIS — M170 Bilateral primary osteoarthritis of knee: Secondary | ICD-10-CM | POA: Diagnosis not present

## 2018-12-16 MED ORDER — LIDOCAINE HCL 1 % IJ SOLN
1.5000 mL | INTRAMUSCULAR | Status: AC | PRN
  Administered 2018-12-16: 1.5 mL

## 2018-12-16 MED ORDER — HYALURONAN 30 MG/2ML IX SOSY
30.0000 mg | PREFILLED_SYRINGE | INTRA_ARTICULAR | Status: AC | PRN
  Administered 2018-12-16: 30 mg via INTRA_ARTICULAR

## 2018-12-16 NOTE — Progress Notes (Deleted)
Office Visit Note  Patient: Danielle Dean             Date of Birth: 07-28-1928           MRN: [id number]             PCP: Wenda Low, MD Referring: Wenda Low, MD Visit Date: 12/30/2018 Occupation: @GUAROCC @  Subjective:  No chief complaint on file.   History of Present Illness: Danielle Dean is a 85 y.o. female ***   Activities of Daily Living:  Patient reports morning stiffness for *** {minute/hour:19697}.   Patient {ACTIONS;DENIES/REPORTS:21021675::"Denies"} nocturnal pain.  Difficulty dressing/grooming: {ACTIONS;DENIES/REPORTS:21021675::"Denies"} Difficulty climbing stairs: {ACTIONS;DENIES/REPORTS:21021675::"Denies"} Difficulty getting out of chair: {ACTIONS;DENIES/REPORTS:21021675::"Denies"} Difficulty using hands for taps, buttons, cutlery, and/or writing: {ACTIONS;DENIES/REPORTS:21021675::"Denies"}  No Rheumatology ROS completed.   PMFS History:  Patient Active Problem List   Diagnosis Date Noted  . Porokeratosis 11/26/2018  . Corns and callosities 11/26/2018  . History of right shoulder replacement 10/07/2016  . Chronic pain of both knees 05/20/2016  . Osteoarthritis of both hands 05/20/2016  . Essential hypertension 05/20/2016  . Environmental allergies 05/20/2016  . Primary osteoarthritis of both knees 05/19/2016  . Callus of foot 11/24/2012  . Pain in joint, ankle and foot 09/15/2012    Past Medical History:  Diagnosis Date  . Callus   . Cataract fragments in both eyes following surgery   . Corns and callosities   . Hx of hysterectomy   . Hypertension     Family History  Problem Relation Age of Onset  . Arthritis Mother   . Diabetes Father    Past Surgical History:  Procedure Laterality Date  . ABDOMINAL HYSTERECTOMY    . NASAL SEPTUM SURGERY    . ROTATOR CUFF REPAIR Right   . TOTAL SHOULDER ARTHROPLASTY     Social History   Social History Narrative  . Not on file    There is no immunization history on file for this patient.    Objective: Vital Signs: There were no vitals taken for this visit.   Physical Exam   Musculoskeletal Exam: ***  CDAI Exam: CDAI Score: - Patient Global: -; Provider Global: - Swollen: -; Tender: - Joint Exam   No joint exam has been documented for this visit   There is currently no information documented on the homunculus. Go to the Rheumatology activity and complete the homunculus joint exam.  Investigation: No additional findings.  Imaging: Xr Knee 3 View Left  Result Date: 11/25/2018 Severe medial compartment narrowing and severe patellofemoral narrowing was noted.  No chondrocalcinosis was noted. Impression: These findings are consistent with severe osteoarthritis and severe chondromalacia patella.  Xr Knee 3 View Right  Result Date: 11/25/2018 Severe lateral compartment narrowing with lateral osteophytes was noted.  Severe patellofemoral narrowing was noted. Impression: These findings are consistent with severe osteoarthritis and severe chondromalacia patella.   Recent Labs: No results found for: WBC, HGB, PLT, NA, K, CL, CO2, GLUCOSE, BUN, CREATININE, BILITOT, ALKPHOS, AST, ALT, PROT, ALBUMIN, CALCIUM, GFRAA, QFTBGOLD, QFTBGOLDPLUS  Speciality Comments: No specialty comments available.  Procedures:  No procedures performed Allergies: Patient has no known allergies.   Assessment / Plan:     Visit Diagnoses: No diagnosis found.   Orders: No orders of the defined types were placed in this encounter.  No orders of the defined types were placed in this encounter.   Face-to-face time spent with patient was *** minutes. Greater than 50% of time was spent in counseling and coordination of  care.  Follow-Up Instructions: No follow-ups on file.   Ellen HenriMarissa C Cyleigh Massaro, CMA  Note - This record has been created using Animal nutritionistDragon software.  Chart creation errors have been sought, but may not always  have been located. Such creation errors do not reflect on  the standard of  medical care.

## 2018-12-16 NOTE — Progress Notes (Signed)
   Procedure Note  Patient: Danielle Dean             Date of Birth: [birth year]           MRN: [id number]             Visit Date: 12/16/2018  Procedures: Visit Diagnoses: Osteoarthritis of both knees orthovisc #2 bilateral B/B Large Joint Inj: bilateral knee on 12/16/2018 10:36 AM Indications: pain Details: 25 G 1.5 in needle, medial approach  Arthrogram: No  Medications (Right): 30 mg Hyaluronan 30 MG/2ML; 1.5 mL lidocaine 1 % Aspirate (Right): 0 mL Medications (Left): 30 mg Hyaluronan 30 MG/2ML; 1.5 mL lidocaine 1 % Aspirate (Left): 0 mL Outcome: tolerated well, no immediate complications Procedure, treatment alternatives, risks and benefits explained, specific risks discussed. Consent was given by the patient. Immediately prior to procedure a time out was called to verify the correct patient, procedure, equipment, support staff and site/side marked as required. Patient was prepped and draped in the usual sterile fashion.     Bo Merino, MD

## 2018-12-23 ENCOUNTER — Ambulatory Visit (INDEPENDENT_AMBULATORY_CARE_PROVIDER_SITE_OTHER): Payer: Medicare Other | Admitting: Physician Assistant

## 2018-12-23 ENCOUNTER — Other Ambulatory Visit: Payer: Self-pay

## 2018-12-23 DIAGNOSIS — M170 Bilateral primary osteoarthritis of knee: Secondary | ICD-10-CM

## 2018-12-23 MED ORDER — LIDOCAINE HCL 1 % IJ SOLN
1.5000 mL | INTRAMUSCULAR | Status: AC | PRN
  Administered 2018-12-23: 1.5 mL

## 2018-12-23 MED ORDER — HYALURONAN 30 MG/2ML IX SOSY
30.0000 mg | PREFILLED_SYRINGE | INTRA_ARTICULAR | Status: AC | PRN
  Administered 2018-12-23: 30 mg via INTRA_ARTICULAR

## 2018-12-23 NOTE — Progress Notes (Signed)
   Procedure Note  Patient: Danielle Dean             Date of Birth: 09-Mar-1929           MRN: [id number]             Visit Date: 12/23/2018  Procedures: Visit Diagnoses:  1. Primary osteoarthritis of both knees    orthovisc #3 bilateral B/B Large Joint Inj: bilateral knee on 12/23/2018 10:54 AM Indications: pain Details: 25 G 1.5 in needle, medial approach  Arthrogram: No  Medications (Right): 30 mg Hyaluronan 30 MG/2ML; 1.5 mL lidocaine 1 % Aspirate (Right): 0 mL Medications (Left): 30 mg Hyaluronan 30 MG/2ML; 1.5 mL lidocaine 1 % Aspirate (Left): 0 mL Outcome: tolerated well, no immediate complications Procedure, treatment alternatives, risks and benefits explained, specific risks discussed. Consent was given by the patient. Immediately prior to procedure a time out was called to verify the correct patient, procedure, equipment, support staff and site/side marked as required. Patient was prepped and draped in the usual sterile fashion.     Patient tolerated the procedure well.  Hazel Sams, PA-C

## 2018-12-30 ENCOUNTER — Ambulatory Visit: Payer: Self-pay | Admitting: Rheumatology

## 2019-03-01 ENCOUNTER — Other Ambulatory Visit: Payer: Self-pay

## 2019-03-01 ENCOUNTER — Encounter: Payer: Self-pay | Admitting: Podiatry

## 2019-03-01 ENCOUNTER — Ambulatory Visit (INDEPENDENT_AMBULATORY_CARE_PROVIDER_SITE_OTHER): Payer: Medicare Other | Admitting: Podiatry

## 2019-03-01 DIAGNOSIS — L84 Corns and callosities: Secondary | ICD-10-CM | POA: Diagnosis not present

## 2019-03-01 DIAGNOSIS — Q828 Other specified congenital malformations of skin: Secondary | ICD-10-CM

## 2019-03-01 NOTE — Progress Notes (Signed)
This patient presents to the office for treatment of her multiple calluses both feet.  She has previously been treated with debridement of these skin lesions.  She presents for preventative foot care services.  Vascular  Dorsalis pedis and posterior tibial pulses are palpable  B/L.  Capillary return  WNL.  Temperature gradient is  WNL.  Skin turgor  WNL  Sensorium  Senn Weinstein monofilament wire  WNL. Normal tactile sensation.  Nail Exam  Patient has normal nails with no evidence of bacterial or fungal infection.  Orthopedic  Exam  Muscle tone and muscle strength  WNL.  No limitations of motion feet  B/L.  No crepitus or joint effusion noted.  Foot type is unremarkable and digits show no abnormalities.  HAV  B/L.  Tailors bunion 5th MPJ  B/L.  Skin  No open lesions.  Normal skin texture and turgor.  [place] durum 5th toe left foot.  Listers corns fifth toes  B/l  Callus sub 5th met  B/l  Callus/Porokeratosis  B/L  Debride callus/porokeratosis  B/l.   Gardiner Barefoot DPM

## 2019-07-06 NOTE — Progress Notes (Signed)
Office Visit Note  Patient: Danielle Dean             Date of Birth: Jul 24, 1928           MRN: [id number]             PCP: Georgann Housekeeper, MD Referring: Georgann Housekeeper, MD Visit Date: 07/15/2019 Occupation: @GUAROCC @  Subjective:  Pain in both knees.  History of Present Illness: Danielle Dean is a 86 y.o. female with history of osteoarthritis.  She states the last cortisone injections were helpful but the Visco supplement injections did not give her any relief.  She has been having increased pain and discomfort in her knee joints for the last 3 months.  She wants to get cortisone injection to her knee joints.  She also has osteoarthritis in her hands which causes discomfort.  She recently has been having some lower back pain as well.  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  Activities of Daily Living:  Patient reports morning stiffness for 0 minutes.   Patient Denies nocturnal pain.  Difficulty dressing/grooming: Denies Difficulty climbing stairs: Reports Difficulty getting out of chair: Denies Difficulty using hands for taps, buttons, cutlery, and/or writing: Reports  Review of Systems  Constitutional: Positive for fatigue. Negative for night sweats, weight gain and weight loss.  HENT: Negative for mouth sores, trouble swallowing, trouble swallowing, mouth dryness and nose dryness.   Eyes: Positive for dryness. Negative for pain, redness, itching and visual disturbance.  Respiratory: Negative for cough, shortness of breath and difficulty breathing.   Cardiovascular: Negative for chest pain, palpitations, hypertension, irregular heartbeat and swelling in legs/feet.  Gastrointestinal: Negative for blood in stool, constipation and diarrhea.  Endocrine: Negative for increased urination.  Genitourinary: Negative for difficulty urinating, painful urination and vaginal dryness.  Musculoskeletal: Positive for arthralgias and joint pain. Negative for joint swelling, myalgias, muscle  weakness, morning stiffness, muscle tenderness and myalgias.  Skin: Negative for color change, rash, hair loss, skin tightness, ulcers and sensitivity to sunlight.  Allergic/Immunologic: Negative for susceptible to infections.  Neurological: Negative for dizziness, numbness, headaches, memory loss, night sweats and weakness.  Hematological: Negative for bruising/bleeding tendency and swollen glands.  Psychiatric/Behavioral: Negative for depressed mood and sleep disturbance. The patient is not nervous/anxious.     PMFS History:  Patient Active Problem List   Diagnosis Date Noted  . Porokeratosis 11/26/2018  . Corns and callosities 11/26/2018  . History of right shoulder replacement 10/07/2016  . Chronic pain of both knees 05/20/2016  . Osteoarthritis of both hands 05/20/2016  . Essential hypertension 05/20/2016  . Environmental allergies 05/20/2016  . Primary osteoarthritis of both knees 05/19/2016  . Callus of foot 11/24/2012  . Pain in joint, ankle and foot 09/15/2012    Past Medical History:  Diagnosis Date  . Callus   . Cataract fragments in both eyes following surgery   . Corns and callosities   . Hx of hysterectomy   . Hypertension     Family History  Problem Relation Age of Onset  . Arthritis Mother   . Diabetes Father    Past Surgical History:  Procedure Laterality Date  . ABDOMINAL HYSTERECTOMY    . NASAL SEPTUM SURGERY    . ROTATOR CUFF REPAIR Right   . TOTAL SHOULDER ARTHROPLASTY     Social History   Social History Narrative  . Not on file    There is no immunization history on file for this patient.   Objective: Vital  Signs: BP 133/73 (BP Location: Left Arm, Patient Position: Sitting, Cuff Size: Normal)   Pulse (!) 55   Resp 11   Ht 5' (1.524 m)   Wt 108 lb (49 kg)   BMI 21.09 kg/m    Physical Exam Vitals and nursing note reviewed.  Constitutional:      Appearance: She is well-developed.  HENT:     Head: Normocephalic and atraumatic.  Eyes:       Conjunctiva/sclera: Conjunctivae normal.  Cardiovascular:     Rate and Rhythm: Normal rate and regular rhythm.     Heart sounds: Normal heart sounds.  Pulmonary:     Effort: Pulmonary effort is normal.     Breath sounds: Normal breath sounds.  Abdominal:     General: Bowel sounds are normal.     Palpations: Abdomen is soft.  Musculoskeletal:     Cervical back: Normal range of motion.  Lymphadenopathy:     Cervical: No cervical adenopathy.  Skin:    General: Skin is warm and dry.     Capillary Refill: Capillary refill takes less than 2 seconds.  Neurological:     Mental Status: She is alert and oriented to person, place, and time.  Psychiatric:        Behavior: Behavior normal.      Musculoskeletal Exam: C-spine was in good range of motion.  Right shoulder joint abduction was limited to 90 degrees.  Left shoulder abduction was full.  Elbow joints with good range of motion.  She has subluxation of several of her PIP and DIP joints.  Thickening of PIP and DIP joints was noted.  No synovitis was noted.  Hip joints and knee joints were in good range of motion.  She has end-stage osteoarthritis which causes incomplete extension of her knee joints.  CDAI Exam: CDAI Score: -- Patient Global: --; Provider Global: -- Swollen: --; Tender: -- Joint Exam 07/15/2019   No joint exam has been documented for this visit   There is currently no information documented on the homunculus. Go to the Rheumatology activity and complete the homunculus joint exam.  Investigation: No additional findings.  Imaging: No results found.  Recent Labs: No results found for: WBC, HGB, PLT, NA, K, CL, CO2, GLUCOSE, BUN, CREATININE, BILITOT, ALKPHOS, AST, ALT, PROT, ALBUMIN, CALCIUM, GFRAA, QFTBGOLD, QFTBGOLDPLUS  Speciality Comments: No specialty comments available.  Procedures:  Large Joint Inj: bilateral knee on 07/15/2019 11:48 AM Indications: pain Details: 27 G 1.5 in needle, medial  approach  Arthrogram: No  Medications (Right): 1.5 mL lidocaine 1 %; 40 mg triamcinolone acetonide 40 MG/ML Medications (Left): 1.5 mL lidocaine 1 %; 40 mg triamcinolone acetonide 40 MG/ML Outcome: tolerated well, no immediate complications Procedure, treatment alternatives, risks and benefits explained, specific risks discussed. Consent was given by the patient. Immediately prior to procedure a time out was called to verify the correct patient, procedure, equipment, support staff and site/side marked as required. Patient was prepped and draped in the usual sterile fashion.     Allergies: Patient has no known allergies.   Assessment / Plan:     Visit Diagnoses: Primary osteoarthritis of both knees-patient has severe end-stage osteoarthritis of bilateral knee joints.  The x-rays were reviewed with the patient and her daughter today.  She is not ready for total knee replacement.  The Visco supplement injections are not effective anymore.  Per her request bilateral knee joints were injected with cortisone as described above.  Indications and side effects were discussed.  Primary  osteoarthritis of both hands-she has severe osteoarthritis in her bilateral hands.  Joint protection was discussed.  Chronic left shoulder pain-she has minimal discomfort currently.  History of right shoulder replacement-she is doing fairly well and the range of motion is limited.  Osteopenia of multiple sites-she is on calcium and vitamin D.  History of hypertension-blood pressure is controlled. Orders: No orders of the defined types were placed in this encounter.  No orders of the defined types were placed in this encounter.   Follow-Up Instructions: Return if symptoms worsen or fail to improve, for Osteoarthritis.   Bo Merino, MD  Note - This record has been created using Editor, commissioning.  Chart creation errors have been sought, but may not always  have been located. Such creation errors do not  reflect on  the standard of medical care.

## 2019-07-14 ENCOUNTER — Ambulatory Visit: Payer: Medicare Other

## 2019-07-15 ENCOUNTER — Ambulatory Visit (INDEPENDENT_AMBULATORY_CARE_PROVIDER_SITE_OTHER): Payer: Medicare Other | Admitting: Rheumatology

## 2019-07-15 ENCOUNTER — Other Ambulatory Visit: Payer: Self-pay

## 2019-07-15 ENCOUNTER — Encounter: Payer: Self-pay | Admitting: Rheumatology

## 2019-07-15 VITALS — BP 133/73 | HR 55 | Resp 11 | Ht 60.0 in | Wt 108.0 lb

## 2019-07-15 DIAGNOSIS — M19042 Primary osteoarthritis, left hand: Secondary | ICD-10-CM

## 2019-07-15 DIAGNOSIS — Z8679 Personal history of other diseases of the circulatory system: Secondary | ICD-10-CM

## 2019-07-15 DIAGNOSIS — M170 Bilateral primary osteoarthritis of knee: Secondary | ICD-10-CM

## 2019-07-15 DIAGNOSIS — M25512 Pain in left shoulder: Secondary | ICD-10-CM

## 2019-07-15 DIAGNOSIS — M19041 Primary osteoarthritis, right hand: Secondary | ICD-10-CM

## 2019-07-15 DIAGNOSIS — Z96611 Presence of right artificial shoulder joint: Secondary | ICD-10-CM | POA: Diagnosis not present

## 2019-07-15 DIAGNOSIS — M8589 Other specified disorders of bone density and structure, multiple sites: Secondary | ICD-10-CM

## 2019-07-15 DIAGNOSIS — G8929 Other chronic pain: Secondary | ICD-10-CM

## 2019-07-15 MED ORDER — TRIAMCINOLONE ACETONIDE 40 MG/ML IJ SUSP
40.0000 mg | INTRAMUSCULAR | Status: AC | PRN
  Administered 2019-07-15: 40 mg via INTRA_ARTICULAR

## 2019-07-15 MED ORDER — LIDOCAINE HCL 1 % IJ SOLN
1.5000 mL | INTRAMUSCULAR | Status: AC | PRN
  Administered 2019-07-15: 1.5 mL

## 2019-07-25 ENCOUNTER — Ambulatory Visit: Payer: Medicare Other

## 2019-08-05 ENCOUNTER — Other Ambulatory Visit: Payer: Self-pay

## 2019-08-05 ENCOUNTER — Ambulatory Visit (INDEPENDENT_AMBULATORY_CARE_PROVIDER_SITE_OTHER): Payer: Medicare Other | Admitting: Podiatry

## 2019-08-05 ENCOUNTER — Encounter: Payer: Self-pay | Admitting: Podiatry

## 2019-08-05 DIAGNOSIS — Q828 Other specified congenital malformations of skin: Secondary | ICD-10-CM | POA: Diagnosis not present

## 2019-08-05 DIAGNOSIS — L84 Corns and callosities: Secondary | ICD-10-CM

## 2019-08-05 NOTE — Progress Notes (Signed)
This patient presents to the office for treatment of her painful  callus left  feet.  She has previously been treated with debridement of her  skin lesions.  She presents for preventative foot care services.  Vascular  Dorsalis pedis and posterior tibial pulses are palpable  B/L.  Capillary return  WNL.  Temperature gradient is  WNL.  Skin turgor  WNL  Sensorium  Senn Weinstein monofilament wire  WNL. Normal tactile sensation.  Nail Exam  Patient has normal nails with no evidence of bacterial or fungal infection.  Orthopedic  Exam  Muscle tone and muscle strength  WNL.  No limitations of motion feet  B/L.  No crepitus or joint effusion noted.  Foot type is unremarkable and digits show no abnormalities.  HAV  B/L.  Tailors bunion 5th MPJ  B/L.  Skin  No open lesions.  Normal skin texture and turgor.  [place] durum 5th toe left foot.    Callus sub 5th met  Left foot.  Callus/Porokeratosis  Left foot  Debride callus/porokeratosis  Left foot Plantar flexed fifth metatarsal  B/L.  Padding sub 5th left foot.  RTC prn.   Gardiner Barefoot DPM

## 2019-11-30 NOTE — Progress Notes (Signed)
Office Visit Note  Patient: Danielle Dean             Date of Birth: 09-08-1928           MRN: [id number]             PCP: Wenda Low, MD Referring: Wenda Low, MD Visit Date: 12/13/2019 Occupation: @GUAROCC @  Subjective:  Other (bilateral knee pain, back pain )   History of Present Illness: Danielle Dean is a 86 y.o. female with history of severe osteoarthritis involving her knee joints.  She states she has chronic discomfort in her knee joints.  The cortisone injection helps her temporarily and then the pain recurs.  She has been also experiencing lower back pain.  There is no radiculopathy.  The pain is localized.  Activities of Daily Living:  Patient reports morning stiffness for  A few minutes.   Patient Denies nocturnal pain.  Difficulty dressing/grooming: Denies Difficulty climbing stairs: Reports Difficulty getting out of chair: Denies Difficulty using hands for taps, buttons, cutlery, and/or writing: Reports  Review of Systems  Constitutional: Positive for fatigue. Negative for night sweats, weight gain and weight loss.  HENT: Negative for mouth sores, trouble swallowing, trouble swallowing, mouth dryness and nose dryness.   Eyes: Positive for pain and dryness. Negative for redness and visual disturbance.  Respiratory: Positive for cough. Negative for shortness of breath and difficulty breathing.   Cardiovascular: Negative for chest pain, palpitations, hypertension, irregular heartbeat and swelling in legs/feet.  Gastrointestinal: Negative for blood in stool, constipation and diarrhea.  Endocrine: Negative for increased urination.  Genitourinary: Negative for difficulty urinating and vaginal dryness.  Musculoskeletal: Positive for arthralgias, joint pain and morning stiffness. Negative for joint swelling, myalgias, muscle weakness, muscle tenderness and myalgias.  Skin: Negative for color change, rash, hair loss, redness, skin tightness, ulcers and  sensitivity to sunlight.  Allergic/Immunologic: Negative for susceptible to infections.  Neurological: Positive for headaches and memory loss. Negative for dizziness, numbness, night sweats and weakness.  Hematological: Negative for bruising/bleeding tendency and swollen glands.  Psychiatric/Behavioral: Positive for sleep disturbance. Negative for depressed mood and confusion. The patient is not nervous/anxious.     PMFS History:  Patient Active Problem List   Diagnosis Date Noted  . Porokeratosis 11/26/2018  . Corns and callosities 11/26/2018  . History of right shoulder replacement 10/07/2016  . Chronic pain of both knees 05/20/2016  . Osteoarthritis of both hands 05/20/2016  . Essential hypertension 05/20/2016  . Environmental allergies 05/20/2016  . Primary osteoarthritis of both knees 05/19/2016  . Callus of foot 11/24/2012  . Pain in joint, ankle and foot 09/15/2012    Past Medical History:  Diagnosis Date  . Callus   . Cataract fragments in both eyes following surgery   . Corns and callosities   . Hx of hysterectomy   . Hypertension     Family History  Problem Relation Age of Onset  . Arthritis Mother   . Diabetes Father    Past Surgical History:  Procedure Laterality Date  . ABDOMINAL HYSTERECTOMY    . NASAL SEPTUM SURGERY    . ROTATOR CUFF REPAIR Right   . TOTAL SHOULDER ARTHROPLASTY     Social History   Social History Narrative  . Not on file    There is no immunization history on file for this patient.   Objective: Vital Signs: BP (!) 145/73 (BP Location: Left Arm, Patient Position: Sitting, Cuff Size: Normal)   Pulse (!) 55  Resp 14   Ht 5' (1.524 m)   Wt 106 lb 9.6 oz (48.4 kg)   BMI 20.82 kg/m    Physical Exam Vitals and nursing note reviewed.  Constitutional:      Appearance: She is well-developed.  HENT:     Head: Normocephalic and atraumatic.  Eyes:     Conjunctiva/sclera: Conjunctivae normal.  Cardiovascular:     Rate and Rhythm:  Normal rate and regular rhythm.     Heart sounds: Normal heart sounds.  Pulmonary:     Effort: Pulmonary effort is normal.     Breath sounds: Normal breath sounds.  Abdominal:     General: Bowel sounds are normal.     Palpations: Abdomen is soft.  Musculoskeletal:     Cervical back: Normal range of motion.  Lymphadenopathy:     Cervical: No cervical adenopathy.  Skin:    General: Skin is warm and dry.     Capillary Refill: Capillary refill takes less than 2 seconds.  Neurological:     Mental Status: She is alert and oriented to person, place, and time.  Psychiatric:        Behavior: Behavior normal.      Musculoskeletal Exam: C-spine was in good range of motion.  She has thoracic scoliosis and kyphosis.  Right shoulder joint abduction is limited to 100 degrees.  Elbow joints and wrist joints with good range of motion.  She has DIP and PIP thickening.  No synovitis was noted.  She is good range of motion of her hip joints.  She had painful range of motion of her right joint with some warmth.  CDAI Exam: CDAI Score: -- Patient Global: --; Provider Global: -- Swollen: --; Tender: -- Joint Exam 12/13/2019   No joint exam has been documented for this visit   There is currently no information documented on the homunculus. Go to the Rheumatology activity and complete the homunculus joint exam.  Investigation: No additional findings.  Imaging: No results found.  Recent Labs: No results found for: WBC, HGB, PLT, NA, K, CL, CO2, GLUCOSE, BUN, CREATININE, BILITOT, ALKPHOS, AST, ALT, PROT, ALBUMIN, CALCIUM, GFRAA, QFTBGOLD, QFTBGOLDPLUS  Speciality Comments: No specialty comments available.  Procedures:  Large Joint Inj: R knee on 12/13/2019 3:37 PM Indications: pain Details: 27 G 1.5 in needle, medial approach  Arthrogram: No  Medications: 40 mg triamcinolone acetonide 40 MG/ML; 1.5 mL lidocaine 1 % Aspirate: 0 mL Outcome: tolerated well, no immediate  complications Procedure, treatment alternatives, risks and benefits explained, specific risks discussed. Consent was given by the patient. Immediately prior to procedure a time out was called to verify the correct patient, procedure, equipment, support staff and site/side marked as required. Patient was prepped and draped in the usual sterile fashion.     Allergies: Patient has no known allergies.   Assessment / Plan:     Visit Diagnoses: Primary osteoarthritis of both knees - patient has severe end-stage osteoarthritis of bilateral knee joints. bilateral knee joint injections on 07/15/2019.  The cortisone injections are not lasting long enough per patient.  She had an adequate response to Visco supplement injections in the past.  She is not ready for total knee replacement.  Per her request right knee joint was injected with cortisone as described above.  She tolerated the procedure well.  She is having difficulty walking.  We discussed use of walker with a seat.  We will give her prescription.  Primary osteoarthritis of both hands - she has severe osteoarthritis  in her bilateral hands.  Joint protection muscle strengthening was discussed.  Chronic left shoulder pain-the pain is manageable currently.  History of right shoulder replacement-her shoulder joint abduction is limited to 100 degrees  Osteopenia of multiple sites-I do not have any of the bone density is available.  She has been getting bone density with her PCP.  Patient states that she has taken bisphosphonates in the past but could not tolerate them and stopped it.  They will forward me a copy of DEXA scan from her PCP.  Loss of height-patient also mentions that she has lost height.  She has worsening of scoliosis and kyphosis.  I offered x-rays with the declined as they had to leave early today.  I will put x-rays of thoracic and lumbar spine in the [facility] system so they can go there at their convenience.  History of  hypertension-her systolic was slightly elevated today.  Have advised her to monitor blood pressure closely.    Orders: Orders Placed This Encounter  Procedures  . Large Joint Inj  . DG Lumbar Spine Complete  . DG Thoracic Spine 4V   No orders of the defined types were placed in this encounter.    Follow-Up Instructions: Return in about 3 months (around 03/14/2020) for Osteoarthritis.   Pollyann Savoy, MD  Note - This record has been created using Animal nutritionist.  Chart creation errors have been sought, but may not always  have been located. Such creation errors do not reflect on  the standard of medical care.

## 2019-12-13 ENCOUNTER — Ambulatory Visit (INDEPENDENT_AMBULATORY_CARE_PROVIDER_SITE_OTHER): Payer: Medicare Other | Admitting: Rheumatology

## 2019-12-13 ENCOUNTER — Other Ambulatory Visit: Payer: Self-pay

## 2019-12-13 ENCOUNTER — Encounter: Payer: Self-pay | Admitting: Rheumatology

## 2019-12-13 VITALS — BP 145/73 | HR 55 | Resp 14 | Ht 60.0 in | Wt 106.6 lb

## 2019-12-13 DIAGNOSIS — M19041 Primary osteoarthritis, right hand: Secondary | ICD-10-CM | POA: Diagnosis not present

## 2019-12-13 DIAGNOSIS — Z8679 Personal history of other diseases of the circulatory system: Secondary | ICD-10-CM

## 2019-12-13 DIAGNOSIS — M1711 Unilateral primary osteoarthritis, right knee: Secondary | ICD-10-CM | POA: Diagnosis not present

## 2019-12-13 DIAGNOSIS — M25512 Pain in left shoulder: Secondary | ICD-10-CM

## 2019-12-13 DIAGNOSIS — M170 Bilateral primary osteoarthritis of knee: Secondary | ICD-10-CM

## 2019-12-13 DIAGNOSIS — G8929 Other chronic pain: Secondary | ICD-10-CM

## 2019-12-13 DIAGNOSIS — M8589 Other specified disorders of bone density and structure, multiple sites: Secondary | ICD-10-CM

## 2019-12-13 DIAGNOSIS — M19042 Primary osteoarthritis, left hand: Secondary | ICD-10-CM

## 2019-12-13 DIAGNOSIS — Z96611 Presence of right artificial shoulder joint: Secondary | ICD-10-CM

## 2019-12-13 DIAGNOSIS — R29890 Loss of height: Secondary | ICD-10-CM

## 2019-12-13 MED ORDER — TRIAMCINOLONE ACETONIDE 40 MG/ML IJ SUSP
40.0000 mg | INTRAMUSCULAR | Status: AC | PRN
  Administered 2019-12-13: 40 mg via INTRA_ARTICULAR

## 2019-12-13 MED ORDER — LIDOCAINE HCL 1 % IJ SOLN
1.5000 mL | INTRAMUSCULAR | Status: AC | PRN
  Administered 2019-12-13: 1.5 mL

## 2020-01-05 ENCOUNTER — Ambulatory Visit (HOSPITAL_COMMUNITY)
Admission: RE | Admit: 2020-01-05 | Discharge: 2020-01-05 | Disposition: A | Discharge status: Home / Self Care | Payer: Medicare Other | Source: Ambulatory Visit | Attending: Rheumatology | Admitting: Rheumatology

## 2020-01-05 ENCOUNTER — Other Ambulatory Visit: Payer: Self-pay

## 2020-01-05 DIAGNOSIS — R29890 Loss of height: Secondary | ICD-10-CM

## 2020-01-05 DIAGNOSIS — M8589 Other specified disorders of bone density and structure, multiple sites: Secondary | ICD-10-CM | POA: Insufficient documentation

## 2020-01-06 NOTE — Progress Notes (Signed)
Scoliosis, degenerative disc disease and facet joint arthropathy was noted.

## 2020-01-06 NOTE — Progress Notes (Signed)
Scoliosis, degenerative disc disease, facet joint arthropathy was noted.

## 2020-02-03 ENCOUNTER — Telehealth: Payer: Self-pay | Admitting: Rheumatology

## 2020-02-03 DIAGNOSIS — M170 Bilateral primary osteoarthritis of knee: Secondary | ICD-10-CM

## 2020-02-03 DIAGNOSIS — M545 Low back pain, unspecified: Secondary | ICD-10-CM

## 2020-02-03 DIAGNOSIS — G8929 Other chronic pain: Secondary | ICD-10-CM

## 2020-02-03 NOTE — Telephone Encounter (Signed)
Patient advised referral has been placed.

## 2020-02-03 NOTE — Telephone Encounter (Signed)
Okay to send for physical therapy

## 2020-02-03 NOTE — Telephone Encounter (Signed)
Referral placed.

## 2020-02-03 NOTE — Telephone Encounter (Signed)
Patient called stating she would like a referral for physical therapy for her back and bilateral knee pain.  Patient states she would like to be referred to the same facility that Dr. Corliss Skains has referred her in the past that is close to her home.  Patient requested a return call.

## 2020-02-16 ENCOUNTER — Ambulatory Visit: Payer: Medicare Other | Attending: Rheumatology | Admitting: Physical Therapy

## 2020-02-16 DIAGNOSIS — M545 Low back pain: Secondary | ICD-10-CM | POA: Insufficient documentation

## 2020-02-16 DIAGNOSIS — R2689 Other abnormalities of gait and mobility: Secondary | ICD-10-CM | POA: Insufficient documentation

## 2020-02-16 DIAGNOSIS — R262 Difficulty in walking, not elsewhere classified: Secondary | ICD-10-CM | POA: Insufficient documentation

## 2020-02-23 ENCOUNTER — Encounter: Payer: Self-pay | Admitting: Physical Therapy

## 2020-02-23 ENCOUNTER — Other Ambulatory Visit: Payer: Self-pay

## 2020-02-23 ENCOUNTER — Ambulatory Visit: Payer: Medicare Other | Admitting: Physical Therapy

## 2020-02-23 DIAGNOSIS — R2689 Other abnormalities of gait and mobility: Secondary | ICD-10-CM | POA: Diagnosis present

## 2020-02-23 DIAGNOSIS — M545 Low back pain, unspecified: Secondary | ICD-10-CM

## 2020-02-23 DIAGNOSIS — R262 Difficulty in walking, not elsewhere classified: Secondary | ICD-10-CM | POA: Diagnosis present

## 2020-02-23 NOTE — Therapy (Signed)
[facility] [place] [address] [place], [name], [address] Phone: [phone]   Fax:  [phone]  Physical Therapy Evaluation  Patient Details  Name: Danielle Dean MRN: [id number] Date of Birth: Dec 05, 1928 Referring Provider (PT): Deveshwar   Encounter Date: 02/23/2020   PT End of Session - 02/23/20 1008    Visit Number 1    Date for PT Re-Evaluation 04/24/20    PT Start Time 0922    PT Stop Time 1013    PT Time Calculation (min) 51 min    Activity Tolerance Patient tolerated treatment well    Behavior During Therapy [facility] for tasks assessed/performed           Past Medical History:  Diagnosis Date  . Callus   . Cataract fragments in both eyes following surgery   . Corns and callosities   . Hx of hysterectomy   . Hypertension     Past Surgical History:  Procedure Laterality Date  . ABDOMINAL HYSTERECTOMY    . NASAL SEPTUM SURGERY    . ROTATOR CUFF REPAIR Right   . TOTAL SHOULDER ARTHROPLASTY      There were no vitals filed for this visit.    Subjective Assessment - 02/23/20 0925    Subjective Patient reports that over the past few years, she reports recently the pain in the knees and back.  She reports that standing or walking 10-15 minutes and she will have to sit to relieve pain.  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  Recent imaging show significant degenrative changes.    Limitations Lifting;Standing;Walking;House hold activities    Patient Stated Goals have less pain, walk better    Currently in Pain? Yes    Pain Score 2     Pain Location Back   bilateral knees   Pain Orientation Right;Left;Lower    Pain Descriptors / Indicators Discomfort    Pain Type Chronic pain    Pain Onset More than a month ago    Pain Frequency Intermittent    Aggravating Factors  walk, stand, cook and clean pain up to 8/10    Pain Relieving Factors sit and rest, pain will go away    Effect of Pain on  Daily Activities limited standing and walking.  Difficulty ADL's              [facility] PT Assessment - 02/23/20 0001      Assessment   Medical Diagnosis bilateral knee and LBP    Referring Provider (PT) Deveshwar    Onset Date/Surgical Date 01/23/20    Prior Therapy 3 years ago      Precautions   Precautions None      Balance Screen   Has the patient fallen in the past 6 months No    Has the patient had a decrease in activity level because of a fear of falling?  No    Is the patient reluctant to leave their home because of a fear of falling?  No      Home Environment   Additional Comments has stairs but does not do these, only cooks and cleans      Prior Function   Level of Independence Independent    Vocation Retired    Leisure walks about a block a day for exercise      Posture/Postural Control   Posture Comments weak abs, forward head, rounded shoulders kyphotic      ROM / Strength  AROM / PROM / Strength AROM;Strength      AROM   Overall AROM Comments AROM of the knees WFL's with some tightness int eh HS      Strength   Overall Strength Comments UE strength 3+/5, LE 3+/5 pain ful and crepitus in the knees weakness throughout the LE's      Palpation   Palpation comment tight in the lumbar paraspinals      Ambulation/Gait   Gait Comments no device, slow, slight shuffle      Standardized Balance Assessment   Standardized Balance Assessment Timed Up and Go Test;Berg Balance Test      Berg Balance Test   Sit to Stand Able to stand without using hands and stabilize independently    Standing Unsupported Able to stand safely 2 minutes    Sitting with Back Unsupported but Feet Supported on Floor or Stool Able to sit safely and securely 2 minutes    Stand to Sit Sits safely with minimal use of hands    Transfers Able to transfer safely, minor use of hands    Standing Unsupported with Eyes Closed Able to stand 10 seconds safely    Standing Unsupported with Feet  Together Able to place feet together independently and stand 1 minute safely    From Standing, Reach Forward with Outstretched Arm Can reach forward >12 cm safely (5")    From Standing Position, Pick up Object from Floor Able to pick up shoe, needs supervision    From Standing Position, Turn to Look Behind Over each Shoulder Turn sideways only but maintains balance    Turn 360 Degrees Able to turn 360 degrees safely but slowly    Standing Unsupported, Alternately Place Feet on Step/Stool Able to complete >2 steps/needs minimal assist    Standing Unsupported, One Foot in [employer] balance while stepping or standing    Standing on One Leg Tries to lift leg/unable to hold 3 seconds but remains standing independently    Total Score 40      Timed Up and Go Test   Normal TUG (seconds) 22    TUG Comments no device, tried with a FWW this was slower and she had difficulty navigating with it time 39 seconds, with a SPC 28 seconds, poor use of the SPC                      Objective measurements completed on examination: See above findings.                 PT Short Term Goals - 02/23/20 1012      PT SHORT TERM GOAL #1   Title independent with initial HEP    Time 2    Period Weeks    Status New             PT Long Term Goals - 02/23/20 1013      PT LONG TERM GOAL #1   Title decrease TUG time to 15 seconds    Time 8    Period Weeks    Status New      PT LONG TERM GOAL #2   Title increase Berg blance score to 49/56    Time 8    Period Weeks    Status New      PT LONG TERM GOAL #3   Title report pain 50% less with cooking    Time 8    Period Weeks    Status New  PT LONG TERM GOAL #4   Title tolerate walking 15 minutes without pain >5/10    Time 8    Period Weeks    Status New      PT LONG TERM GOAL #5   Title independent and safe with advance HEP and balance    Time 8    Period Weeks    Status New                  Plan -  02/23/20 1009    Clinical Impression Statement Patient has some significant arthritis inthe knees and the back, she has DDD in the back, she reports that she has had more and more difficulty with walking and ADL's, reports activity walking and standing tolerance is 15 minutes.  Her TUG was 22 seconds, she tends to hold onto walls and is unsteady, small shuffling steps.  Berg balance test was 40/56.  Has weakness in the UE and LE's    Stability/Clinical Decision Making Evolving/Moderate complexity    Clinical Decision Making Moderate    Rehab Potential Good    PT Frequency 3x / week    PT Duration 8 weeks    PT Treatment/Interventions ADLs/Self Care Home Management;Electrical Stimulation;Moist Heat;Gait training;Neuromuscular re-education;Balance training;Therapeutic exercise;Therapeutic activities;Functional mobility training;Stair training;Patient/family education    PT Next Visit Plan work on movements for some pain relief, needs balance training    Consulted and Agree with Plan of Care Patient           Patient will benefit from skilled therapeutic intervention in order to improve the following deficits and impairments:  Abnormal gait, Decreased activity tolerance, Decreased balance, Decreased mobility, Decreased strength, Difficulty walking, Decreased safety awareness, Impaired flexibility  Visit Diagnosis: Acute bilateral low back pain without sciatica - Plan: PT plan of care cert/re-cert  Difficulty in walking, not elsewhere classified - Plan: PT plan of care cert/re-cert  Other abnormalities of gait and mobility - Plan: PT plan of care cert/re-cert     Problem List Patient Active Problem List   Diagnosis Date Noted  . Porokeratosis 11/26/2018  . Corns and callosities 11/26/2018  . History of right shoulder replacement 10/07/2016  . Chronic pain of both knees 05/20/2016  . Osteoarthritis of both hands 05/20/2016  . Essential hypertension 05/20/2016  . Environmental allergies  05/20/2016  . Primary osteoarthritis of both knees 05/19/2016  . Callus of foot 11/24/2012  . Pain in joint, ankle and foot 09/15/2012    Jearld Lesch., PT 02/23/2020, 10:16 AM  [facility] [place] [address] [place], [name], [address] Phone: [phone]   Fax:  [phone]  Name: Danielle Dean MRN: [id number] Date of Birth: 02/21/1929

## 2020-02-23 NOTE — Patient Instructions (Signed)
Access Code: [email or web address] URL: [email or web address] Date: 02/23/2020 Prepared by: Stacie Glaze  Exercises Single Leg Stance with Support - 1 x daily - 7 x weekly - 1 sets - 10 reps - 3 hold Tandem Stance with Support - 1 x daily - 7 x weekly - 1 sets - 10 reps - 5 hold Single Leg Balance in March Position - 1 x daily - 7 x weekly - 1 sets - 10 reps - 3 hold Side Stepping with Counter Support - 1 x daily - 7 x weekly - 1 sets - 10 reps - 1 hold

## 2020-02-24 ENCOUNTER — Ambulatory Visit: Payer: Medicare Other | Admitting: Physical Therapy

## 2020-02-24 ENCOUNTER — Encounter: Payer: Self-pay | Admitting: Physical Therapy

## 2020-02-24 DIAGNOSIS — R2689 Other abnormalities of gait and mobility: Secondary | ICD-10-CM

## 2020-02-24 DIAGNOSIS — M545 Low back pain, unspecified: Secondary | ICD-10-CM

## 2020-02-24 DIAGNOSIS — R262 Difficulty in walking, not elsewhere classified: Secondary | ICD-10-CM

## 2020-02-24 NOTE — Therapy (Signed)
[facility] [place] [facility] [address] [place], [name], [address] Phone: [phone]   Fax:  [phone]  Physical Therapy Treatment  Patient Details  Name: Danielle Dean MRN: [id number] Date of Birth: 02/15/1929 Referring Provider (PT): Deveshwar   Encounter Date: 02/24/2020   PT End of Session - 02/24/20 1036    Visit Number 2    Date for PT Re-Evaluation 04/24/20    PT Start Time 0930    PT Stop Time 1010    PT Time Calculation (min) 40 min    Activity Tolerance Patient tolerated treatment well    Behavior During Therapy [facility] for tasks assessed/performed           Past Medical History:  Diagnosis Date  . Callus   . Cataract fragments in both eyes following surgery   . Corns and callosities   . Hx of hysterectomy   . Hypertension     Past Surgical History:  Procedure Laterality Date  . ABDOMINAL HYSTERECTOMY    . NASAL SEPTUM SURGERY    . ROTATOR CUFF REPAIR Right   . TOTAL SHOULDER ARTHROPLASTY      There were no vitals filed for this visit.   Subjective Assessment - 02/24/20 0924    Subjective I am a little stiff this morning    Currently in Pain? Yes    Pain Score 2     Pain Location Knee    Pain Orientation Right;Left    Pain Descriptors / Indicators Sore;Aching                             [facility] Adult PT Treatment/Exercise - 02/24/20 0001      Ambulation/Gait   Gait Comments worked on the stairs just for strength and some balance       High Level Balance   High Level Balance Comments on airex head turns, ball toss on airex, on solid surface, ball kicks, balance beam, side stepping on balance beam, , side step overs      Exercises   Exercises Knee/Hip      Knee/Hip Exercises: Stretches   Passive Hamstring Stretch Both;4 reps;10 seconds    Other Knee/Hip Stretches gentle lower trunk rotation      Knee/Hip Exercises: Aerobic   Recumbent Bike 5 minutes with a few rests due to  left knee pain    Nustep leve 3 x 5 minutes      Knee/Hip Exercises: Supine   Other Supine Knee/Hip Exercises fee on ball K2C, trunk rotation, small bridges                    PT Short Term Goals - 02/23/20 1012      PT SHORT TERM GOAL #1   Title independent with initial HEP    Time 2    Period Weeks    Status New             PT Long Term Goals - 02/23/20 1013      PT LONG TERM GOAL #1   Title decrease TUG time to 15 seconds    Time 8    Period Weeks    Status New      PT LONG TERM GOAL #2   Title increase Berg blance score to 49/56    Time 8    Period Weeks    Status New      PT LONG TERM  GOAL #3   Title report pain 50% less with cooking    Time 8    Period Weeks    Status New      PT LONG TERM GOAL #4   Title tolerate walking 15 minutes without pain >5/10    Time 8    Period Weeks    Status New      PT LONG TERM GOAL #5   Title independent and safe with advance HEP and balance    Time 8    Period Weeks    Status New                 Plan - 02/24/20 1036    Clinical Impression Statement Patient had difficulty with the bike, some left knee pain and stiffness, did better on the nustep, with the higher level balance issues she does need CGA to min A.  C/O back pain with the standing activities    PT Next Visit Plan work on movements for some pain relief, needs balance training    Consulted and Agree with Plan of Care Patient           Patient will benefit from skilled therapeutic intervention in order to improve the following deficits and impairments:  Abnormal gait, Decreased activity tolerance, Decreased balance, Decreased mobility, Decreased strength, Difficulty walking, Decreased safety awareness, Impaired flexibility  Visit Diagnosis: Acute bilateral low back pain without sciatica  Difficulty in walking, not elsewhere classified  Other abnormalities of gait and mobility     Problem List Patient Active Problem List    Diagnosis Date Noted  . Porokeratosis 11/26/2018  . Corns and callosities 11/26/2018  . History of right shoulder replacement 10/07/2016  . Chronic pain of both knees 05/20/2016  . Osteoarthritis of both hands 05/20/2016  . Essential hypertension 05/20/2016  . Environmental allergies 05/20/2016  . Primary osteoarthritis of both knees 05/19/2016  . Callus of foot 11/24/2012  . Pain in joint, ankle and foot 09/15/2012    Jearld Lesch., PT 02/24/2020, 10:39 AM  [facility] [place] [facility] [address] [place], [name], [address] Phone: [phone]   Fax:  [phone]  Name: Danielle Dean MRN: [id number] Date of Birth: 07/04/1928

## 2020-02-28 ENCOUNTER — Ambulatory Visit: Payer: Medicare Other

## 2020-02-28 ENCOUNTER — Other Ambulatory Visit: Payer: Self-pay

## 2020-02-28 DIAGNOSIS — M545 Low back pain, unspecified: Secondary | ICD-10-CM

## 2020-02-28 DIAGNOSIS — R262 Difficulty in walking, not elsewhere classified: Secondary | ICD-10-CM

## 2020-02-28 DIAGNOSIS — R2689 Other abnormalities of gait and mobility: Secondary | ICD-10-CM

## 2020-02-28 NOTE — Therapy (Signed)
[facility] [place] [facility] [address] [place], [name], [address] Phone: [phone]   Fax:  [phone]  Physical Therapy Treatment  Patient Details  Name: Danielle Dean MRN: [id number] Date of Birth: 05/29/1929 Referring Provider (PT): Deveshwar   Encounter Date: 02/28/2020   PT End of Session - 02/28/20 1019    Visit Number 3    Date for PT Re-Evaluation 04/24/20    PT Start Time 1016    PT Stop Time 1056    PT Time Calculation (min) 40 min    Activity Tolerance Patient tolerated treatment well    Behavior During Therapy [facility] for tasks assessed/performed           Past Medical History:  Diagnosis Date  . Callus   . Cataract fragments in both eyes following surgery   . Corns and callosities   . Hx of hysterectomy   . Hypertension     Past Surgical History:  Procedure Laterality Date  . ABDOMINAL HYSTERECTOMY    . NASAL SEPTUM SURGERY    . ROTATOR CUFF REPAIR Right   . TOTAL SHOULDER ARTHROPLASTY      There were no vitals filed for this visit.   Subjective Assessment - 02/28/20 1019    Subjective Pt says she feels good today.    Currently in Pain? No/denies                             [facility] Adult PT Treatment/Exercise - 02/28/20 0001      High Level Balance   High Level Balance Comments Airex: multi-directional head turns with feet apart and together (min A for FTEO); tandem on balance beam 1 lap, 3 laps on floor; side stepping on floor x 3 laps; attempted amb with reciprocal arm swing and marching, but pt unable to coordinate   gait belt and CGA to min A     Knee/Hip Exercises: Stretches   Passive Hamstring Stretch Both;4 reps;10 seconds      Knee/Hip Exercises: Aerobic   Nustep L 4 x 5 min    Other Aerobic UBE 2 min fwd/2 min bkwd level 3      Knee/Hip Exercises: Supine   Other Supine Knee/Hip Exercises Feet on ball K2C, small bridges (pain so back tracked to PPT then pt able to  perform x 12 without pain), trunk rot'ns    Other Supine Knee/Hip Exercises PPT with ball ADD and towel roll under low back for tactile feedback 15x5", ball squeeze 10 x 5" in H/L                    PT Short Term Goals - 02/23/20 1012      PT SHORT TERM GOAL #1   Title independent with initial HEP    Time 2    Period Weeks    Status New             PT Long Term Goals - 02/23/20 1013      PT LONG TERM GOAL #1   Title decrease TUG time to 15 seconds    Time 8    Period Weeks    Status New      PT LONG TERM GOAL #2   Title increase Berg blance score to 49/56    Time 8    Period Weeks    Status New      PT LONG TERM GOAL #3  Title report pain 50% less with cooking    Time 8    Period Weeks    Status New      PT LONG TERM GOAL #4   Title tolerate walking 15 minutes without pain >5/10    Time 8    Period Weeks    Status New      PT LONG TERM GOAL #5   Title independent and safe with advance HEP and balance    Time 8    Period Weeks    Status New                 Plan - 02/28/20 1020    Clinical Impression Statement Pt tolerated UBE and NuStep well with no report of pain. Pt initially had LBP performing small bridging that was abolished with focus on PPT and ball ADD. She fatigued easily with ball adduction in H/L and was unable to keep ball b/t knees with bridging after PPT. Pt has very impaired balance and struggled with balance beam AMB, so transitioned to floor with CGA to min A.    PT Next Visit Plan work on movements for some pain relief, needs balance training    Consulted and Agree with Plan of Care Patient           Patient will benefit from skilled therapeutic intervention in order to improve the following deficits and impairments:  Abnormal gait, Decreased activity tolerance, Decreased balance, Decreased mobility, Decreased strength, Difficulty walking, Decreased safety awareness, Impaired flexibility  Visit Diagnosis: Acute  bilateral low back pain without sciatica  Difficulty in walking, not elsewhere classified  Other abnormalities of gait and mobility     Problem List Patient Active Problem List   Diagnosis Date Noted  . Porokeratosis 11/26/2018  . Corns and callosities 11/26/2018  . History of right shoulder replacement 10/07/2016  . Chronic pain of both knees 05/20/2016  . Osteoarthritis of both hands 05/20/2016  . Essential hypertension 05/20/2016  . Environmental allergies 05/20/2016  . Primary osteoarthritis of both knees 05/19/2016  . Callus of foot 11/24/2012  . Pain in joint, ankle and foot 09/15/2012    Marcelline Mates, PT, DPT 02/28/2020, 1:23 PM  [facility] [place] [facility] [address] [place], [name], [address] Phone: [phone]   Fax:  [phone]  Name: JALECIA LEON MRN: [id number] Date of Birth: [birth year]

## 2020-02-29 ENCOUNTER — Other Ambulatory Visit: Payer: Self-pay

## 2020-02-29 ENCOUNTER — Encounter: Payer: Self-pay | Admitting: Physical Therapy

## 2020-02-29 ENCOUNTER — Ambulatory Visit: Payer: Medicare Other | Admitting: Physical Therapy

## 2020-02-29 DIAGNOSIS — R2689 Other abnormalities of gait and mobility: Secondary | ICD-10-CM

## 2020-02-29 DIAGNOSIS — M545 Low back pain, unspecified: Secondary | ICD-10-CM

## 2020-02-29 DIAGNOSIS — R262 Difficulty in walking, not elsewhere classified: Secondary | ICD-10-CM

## 2020-02-29 NOTE — Therapy (Signed)
[place], [place], [address] Phone: [phone]   Fax:  [phone]  Physical Therapy Treatment  Patient Details  Name: Danielle Dean Dean MRN: [id number] Date of Birth: 1928/08/28 Referring Provider (PT): Deveshwar   Encounter Date: 02/29/2020   PT End of Session - 02/29/20 1056    Visit Number 4    Date for PT Re-Evaluation 04/24/20    PT Start Time 1013    PT Stop Time 1056    PT Time Calculation (min) 43 min    Activity Tolerance Patient tolerated treatment well    Behavior During Therapy [facility] for tasks assessed/performed           Past Medical History:  Diagnosis Date  . Callus   . Cataract fragments in both eyes following surgery   . Corns and callosities   . Hx of hysterectomy   . Hypertension     Past Surgical History:  Procedure Laterality Date  . ABDOMINAL HYSTERECTOMY    . NASAL SEPTUM SURGERY    . ROTATOR CUFF REPAIR Right   . TOTAL SHOULDER ARTHROPLASTY      There were no vitals filed for this visit.   Subjective Assessment - 02/29/20 1018    Subjective Feeling ok, always have pain in knees in back    Currently in Pain? Yes    Pain Score 5     Pain Location Knee    Pain Orientation Left;Right                             [facility] Adult PT Treatment/Exercise - 02/29/20 0001      Ambulation/Gait   Gait Comments step up with 4 in stairs and bilat rail 2x5 each      High Level Balance   High Level Balance Activities Side stepping;Backward walking    High Level Balance Comments ball toss on airex, side stepping on balance beam, , side step overs, forward step overs      Knee/Hip Exercises: Stretches   Other Knee/Hip Stretches gentle lower trunk rotation      Knee/Hip Exercises: Aerobic   Recumbent Bike x1 min     Nustep L 4 x 5 min      Knee/Hip Exercises: Seated   Long Arc Quad Both;2 sets;10 reps    Long Arc Quad Weight 0 lbs.    Hamstring  Curl Both;2 sets;10 reps    Hamstring Limitations yellow Tband       Knee/Hip Exercises: Supine   Bridges 2 sets;10 reps;5 reps                    PT Short Term Goals - 02/29/20 1100      PT SHORT TERM GOAL #1   Title independent with initial HEP    Status Partially Met             PT Long Term Goals - 02/23/20 1013      PT LONG TERM GOAL #1   Title decrease TUG time to 15 seconds    Time 8    Period Weeks    Status New      PT LONG TERM GOAL #2   Title increase Berg blance score to 49/56    Time 8    Period Weeks    Status New      PT LONG TERM GOAL #3   Title report  pain 50% less with cooking    Time 8    Period Weeks    Status New      PT LONG TERM GOAL #4   Title tolerate walking 15 minutes without pain >5/10    Time 8    Period Weeks    Status New      PT LONG TERM GOAL #5   Title independent and safe with advance HEP and balance    Time 8    Period Weeks    Status New                 Plan - 02/29/20 1057    Clinical Impression Statement Added some LE strengthening interventions that's isolated quads and hamstrings. UE assist needed with sides steps on balance beams. Some instability on airex with ball toss but able to recover on her own. LOB x 1 when stepping over objects when leading with LLE. Bilat HS tightness noted with passive stretching.    Stability/Clinical Decision Making Evolving/Moderate complexity    Rehab Potential Good    PT Frequency 3x / week    PT Duration 8 weeks    PT Treatment/Interventions ADLs/Self Care Home Management;Electrical Stimulation;Moist Heat;Gait training;Neuromuscular re-education;Balance training;Therapeutic exercise;Therapeutic activities;Functional mobility training;Stair training;Patient/family education    PT Next Visit Plan work on movements for some pain relief, needs balance training           Patient will benefit from skilled therapeutic intervention in order to improve the following  deficits and impairments:  Abnormal gait, Decreased activity tolerance, Decreased balance, Decreased mobility, Decreased strength, Difficulty walking, Decreased safety awareness, Impaired flexibility  Visit Diagnosis: Difficulty in walking, not elsewhere classified  Other abnormalities of gait and mobility  Acute bilateral low back pain without sciatica     Problem List Patient Active Problem List   Diagnosis Date Noted  . Porokeratosis 11/26/2018  . Corns and callosities 11/26/2018  . History of right shoulder replacement 10/07/2016  . Chronic pain of both knees 05/20/2016  . Osteoarthritis of both hands 05/20/2016  . Essential hypertension 05/20/2016  . Environmental allergies 05/20/2016  . Primary osteoarthritis of both knees 05/19/2016  . Callus of foot 11/24/2012  . Pain in joint, ankle and foot 09/15/2012    Scot Jun, PTA 02/29/2020, 11:00 AM  [name], [place], [address] Phone: [phone]   Fax:  [phone]  Name: Danielle Dean Dean MRN: [id number] Date of Birth: 02/23/1929

## 2020-03-05 ENCOUNTER — Ambulatory Visit: Payer: Medicare Other | Admitting: Physical Therapy

## 2020-03-05 ENCOUNTER — Encounter: Payer: Self-pay | Admitting: Physical Therapy

## 2020-03-05 ENCOUNTER — Other Ambulatory Visit: Payer: Self-pay

## 2020-03-05 DIAGNOSIS — M545 Low back pain, unspecified: Secondary | ICD-10-CM

## 2020-03-05 DIAGNOSIS — R2689 Other abnormalities of gait and mobility: Secondary | ICD-10-CM

## 2020-03-05 DIAGNOSIS — R262 Difficulty in walking, not elsewhere classified: Secondary | ICD-10-CM

## 2020-03-05 NOTE — Progress Notes (Signed)
Office Visit Note  Patient: Danielle Dean             Date of Birth: June 02, 1929           MRN: [id number]             PCP: Georgann Housekeeper, MD Referring: Georgann Housekeeper, MD Visit Date: 03/15/2020 Occupation: @GUAROCC @  Subjective:  Other (bilateral knee pain and low back pain )   History of Present Illness: Danielle Dean is a 86 y.o. female with history of osteoarthritis and degenerative disc disease.  She continues to have discomfort in her left shoulder.  She wanted to discuss her x-ray findings from the last visit.  Which showed multilevel spondylosis and thoracolumbar scoliosis.  She continues to have discomfort in her lower back.  She has been going to physical therapy and had only one visit so far.  She continues to have discomfort in her knee joints.  Activities of Daily Living:  Patient reports morning stiffness for 0  minutes.   Patient Reports nocturnal pain.  Difficulty dressing/grooming: Denies Difficulty climbing stairs: Reports Difficulty getting out of chair: Denies Difficulty using hands for taps, buttons, cutlery, and/or writing: Reports  Review of Systems  Constitutional: Negative for fatigue.  HENT: Negative for mouth sores, mouth dryness and nose dryness.   Eyes: Positive for dryness. Negative for itching.  Respiratory: Negative for shortness of breath and difficulty breathing.   Cardiovascular: Negative for chest pain and palpitations.  Gastrointestinal: Negative for blood in stool, constipation and diarrhea.  Endocrine: Negative for increased urination.  Genitourinary: Negative for difficulty urinating.  Musculoskeletal: Positive for arthralgias, joint pain, joint swelling, myalgias and myalgias. Negative for morning stiffness and muscle tenderness.  Skin: Negative for color change, rash and redness.  Allergic/Immunologic: Negative for susceptible to infections.  Neurological: Positive for numbness and memory loss. Negative for dizziness, headaches and  weakness.  Hematological: Positive for bruising/bleeding tendency.  Psychiatric/Behavioral: Negative for confusion.    PMFS History:  Patient Active Problem List   Diagnosis Date Noted   Porokeratosis 11/26/2018   Corns and callosities 11/26/2018   History of right shoulder replacement 10/07/2016   Chronic pain of both knees 05/20/2016   Osteoarthritis of both hands 05/20/2016   Essential hypertension 05/20/2016   Environmental allergies 05/20/2016   Primary osteoarthritis of both knees 05/19/2016   Callus of foot 11/24/2012   Pain in joint, ankle and foot 09/15/2012    Past Medical History:  Diagnosis Date   Callus    Cataract fragments in both eyes following surgery    Corns and callosities    Hx of hysterectomy    Hypertension     Family History  Problem Relation Age of Onset   Arthritis Mother    Diabetes Father    Past Surgical History:  Procedure Laterality Date   ABDOMINAL HYSTERECTOMY     NASAL SEPTUM SURGERY     ROTATOR CUFF REPAIR Right    TOTAL SHOULDER ARTHROPLASTY     Social History   Social History Narrative   Not on file   Immunization History  Administered Date(s) Administered   PFIZER SARS-COV-2 Vaccination 07/06/2019, 07/27/2019, 03/10/2020     Objective: Vital Signs: BP (!) 148/68 (BP Location: Left Arm, Patient Position: Sitting, Cuff Size: Normal)    Pulse (!) 53    Resp 13    Ht 5' (1.524 m)    Wt 102 lb 9.6 oz (46.5 kg)    BMI 20.04 kg/m  Physical Exam Vitals and nursing note reviewed.  Constitutional:      Appearance: She is well-developed.  HENT:     Head: Normocephalic and atraumatic.  Eyes:     Conjunctiva/sclera: Conjunctivae normal.  Cardiovascular:     Rate and Rhythm: Normal rate and regular rhythm.     Heart sounds: Normal heart sounds.  Pulmonary:     Effort: Pulmonary effort is normal.     Breath sounds: Normal breath sounds.  Abdominal:     General: Bowel sounds are normal.     Palpations:  Abdomen is soft.  Musculoskeletal:     Cervical back: Normal range of motion.  Lymphadenopathy:     Cervical: No cervical adenopathy.  Skin:    General: Skin is warm and dry.     Capillary Refill: Capillary refill takes less than 2 seconds.  Neurological:     Mental Status: She is alert and oriented to person, place, and time.  Psychiatric:        Behavior: Behavior normal.      Musculoskeletal Exam: She had thoracolumbar scoliosis.  She had no point tenderness.  Right shoulder joint abduction was limited to 90 degrees.  Left shoulder joint was in good range of motion with some discomfort over her acromioclavicular joint.  Elbow joints, wrist joints with good range of motion.  She has bilateral PIP and DIP thickening with no synovitis.  Hip joints and knee joints are in good range of motion.  No warmth swelling or effusion was noted in the knee joints.  No tenderness over ankle joints or MTPs was noted.  CDAI Exam: CDAI Score: -- Patient Global: --; Provider Global: -- Swollen: --; Tender: -- Joint Exam 03/15/2020   No joint exam has been documented for this visit   There is currently no information documented on the homunculus. Go to the Rheumatology activity and complete the homunculus joint exam.  Investigation: No additional findings.  Imaging: No results found.  Recent Labs: No results found for: WBC, HGB, PLT, NA, K, CL, CO2, GLUCOSE, BUN, CREATININE, BILITOT, ALKPHOS, AST, ALT, PROT, ALBUMIN, CALCIUM, GFRAA, QFTBGOLD, QFTBGOLDPLUS  Speciality Comments: No specialty comments available.  Procedures:  No procedures performed Allergies: Patient has no known allergies.   Assessment / Plan:     Visit Diagnoses: Primary osteoarthritis of both knees - patient has severe end-stage osteoarthritis of bilateral knee joints. bilateral knee joint injections on 07/15/2019.  She continues to have some discomfort in the knee joints.  No warmth swelling effusion was noted.  I have  advised her to continue to exercise.  Primary osteoarthritis of both hands - she has severe osteoarthritis in her bilateral hands.  Joint protection muscle strengthening was discussed.  Chronic left shoulder pain-she has left shoulder joint discomfort due to acromioclavicular arthritis.  History of right shoulder replacement-doing well with limited range of motion.  DDD (degenerative disc disease), thoracic-she has thoracolumbar scoliosis.  DDD (degenerative disc disease), lumbar-she has thoracolumbar scoliosis and chronic pain.  She is going to physical therapy which should be helpful.  Other idiopathic scoliosis, thoracolumbar region  Osteopenia of multiple sites - Patient states that she has taken bisphosphonates in the past but could not tolerate them and stopped it.  I will schedule DEXA scan to evaluate for osteoporosis.  History of hypertension  Loss of height - patient also mentions that she has lost height.  She has worsening of scoliosis and kyphosis.  Educated about COVID-19 virus infection-use of mask, social distancing and hand  hygiene is discussed.  She is fully vaccinated against COVID-19 and had her booster.  Use of monoclonal antibodies were also discussed.  Orders: No orders of the defined types were placed in this encounter.  No orders of the defined types were placed in this encounter.    Follow-Up Instructions: Return in about 3 months (around 06/14/2020) for Osteoarthritis,DDD.   Pollyann Savoy, MD  Note - This record has been created using Animal nutritionist.  Chart creation errors have been sought, but may not always  have been located. Such creation errors do not reflect on  the standard of medical care.

## 2020-03-05 NOTE — Therapy (Signed)
[facility] [address] [place], [name], [address] Phone: [phone]   Fax:  [phone]  Physical Therapy Treatment  Patient Details  Name: Danielle Dean MRN: [id number] Date of Birth: [birth year] Referring Provider (PT): Deveshwar   Encounter Date: 03/05/2020   PT End of Session - 03/05/20 1057    Visit Number 5    Date for PT Re-Evaluation 04/24/20    PT Start Time 1013    PT Stop Time 1056    PT Time Calculation (min) 43 min           Past Medical History:  Diagnosis Date  . Callus   . Cataract fragments in both eyes following surgery   . Corns and callosities   . Hx of hysterectomy   . Hypertension     Past Surgical History:  Procedure Laterality Date  . ABDOMINAL HYSTERECTOMY    . NASAL SEPTUM SURGERY    . ROTATOR CUFF REPAIR Right   . TOTAL SHOULDER ARTHROPLASTY      There were no vitals filed for this visit.   Subjective Assessment - 03/05/20 1013    Subjective Doing ok    Currently in Pain? No/denies                             [facility] Adult PT Treatment/Exercise - 03/05/20 0001      Knee/Hip Exercises: Stretches   Passive Hamstring Stretch Both;4 reps;10 seconds    Other Knee/Hip Stretches gentle lower trunk rotation    Other Knee/Hip Stretches single K2C 3 reps x10 sec      Knee/Hip Exercises: Aerobic   Nustep L 3 x 6 min    Other Aerobic UBE 2 min fwd/2 min bkwd level 3      Knee/Hip Exercises: Standing   Walking with Sports Cord 10lb 4 way x 3 each     Other Standing Knee Exercises Alt 4in box taps. x10 each      Knee/Hip Exercises: Seated   Long Arc Quad Both;2 sets;5 reps    Long Arc Quad Weight 2 lbs.    Other Seated Knee/Hip Exercises Tband Rows red 2x10     Other Seated Knee/Hip Exercises Shoulder Ext yellow 2x10     Hamstring Curl Both;2 sets;10 reps    Hamstring Limitations red band                     PT Short Term Goals - 03/05/20 1102       PT SHORT TERM GOAL #1   Title independent with initial HEP    Status Achieved             PT Long Term Goals - 02/23/20 1013      PT LONG TERM GOAL #1   Title decrease TUG time to 15 seconds    Time 8    Period Weeks    Status New      PT LONG TERM GOAL #2   Title increase Berg blance score to 49/56    Time 8    Period Weeks    Status New      PT LONG TERM GOAL #3   Title report pain 50% less with cooking    Time 8    Period Weeks    Status New      PT LONG TERM GOAL #4   Title tolerate walking 15 minutes without pain >5/10  Time 8    Period Weeks    Status New      PT LONG TERM GOAL #5   Title independent and safe with advance HEP and balance    Time 8    Period Weeks    Status New                 Plan - 03/05/20 1059    Clinical Impression Statement Pt did well with progressed session. Added some resisted gait, pt has some initial instability but able to correct as reps progressed. Pt did have some LE fatigue with resisted gait. lose SBA needed with alt step ups. Cues to keep shoulder down with seated Tband rows.    Stability/Clinical Decision Making Evolving/Moderate complexity    Rehab Potential Good    PT Frequency 3x / week    PT Duration 8 weeks    PT Treatment/Interventions ADLs/Self Care Home Management;Electrical Stimulation;Moist Heat;Gait training;Neuromuscular re-education;Balance training;Therapeutic exercise;Therapeutic activities;Functional mobility training;Stair training;Patient/family education    PT Next Visit Plan work on movements for some pain relief, needs balance training           Patient will benefit from skilled therapeutic intervention in order to improve the following deficits and impairments:  Abnormal gait, Decreased activity tolerance, Decreased balance, Decreased mobility, Decreased strength, Difficulty walking, Decreased safety awareness, Impaired flexibility  Visit Diagnosis: Other abnormalities of gait and  mobility  Acute bilateral low back pain without sciatica  Difficulty in walking, not elsewhere classified     Problem List Patient Active Problem List   Diagnosis Date Noted  . Porokeratosis 11/26/2018  . Corns and callosities 11/26/2018  . History of right shoulder replacement 10/07/2016  . Chronic pain of both knees 05/20/2016  . Osteoarthritis of both hands 05/20/2016  . Essential hypertension 05/20/2016  . Environmental allergies 05/20/2016  . Primary osteoarthritis of both knees 05/19/2016  . Callus of foot 11/24/2012  . Pain in joint, ankle and foot 09/15/2012    Grayce Sessions, PTA 03/05/2020, 11:04 AM  [facility] [address] [place], [name], [address] Phone: [phone]   Fax:  [phone]  Name: KAELA BEITZ MRN: [id number] Date of Birth: 05-23-1929

## 2020-03-07 ENCOUNTER — Ambulatory Visit: Payer: Medicare Other | Admitting: Physical Therapy

## 2020-03-07 ENCOUNTER — Encounter: Payer: Self-pay | Admitting: Physical Therapy

## 2020-03-07 ENCOUNTER — Other Ambulatory Visit: Payer: Self-pay

## 2020-03-07 DIAGNOSIS — R2689 Other abnormalities of gait and mobility: Secondary | ICD-10-CM

## 2020-03-07 DIAGNOSIS — M545 Low back pain, unspecified: Secondary | ICD-10-CM

## 2020-03-07 DIAGNOSIS — R262 Difficulty in walking, not elsewhere classified: Secondary | ICD-10-CM

## 2020-03-07 NOTE — Therapy (Signed)
[facility] [place] [facility] [address] [place], [name], [address] Phone: [phone]   Fax:  [phone]  Physical Therapy Treatment  Patient Details  Name: Danielle Dean MRN: [id number] Date of Birth: Jan 19, 1929 Referring Provider (PT): Deveshwar   Encounter Date: 03/07/2020   PT End of Session - 03/07/20 1142    Visit Number 6    Date for PT Re-Evaluation 04/24/20    PT Start Time 1056    PT Stop Time 1143    PT Time Calculation (min) 47 min    Activity Tolerance Patient tolerated treatment well    Behavior During Therapy [facility] for tasks assessed/performed           Past Medical History:  Diagnosis Date  . Callus   . Cataract fragments in both eyes following surgery   . Corns and callosities   . Hx of hysterectomy   . Hypertension     Past Surgical History:  Procedure Laterality Date  . ABDOMINAL HYSTERECTOMY    . NASAL SEPTUM SURGERY    . ROTATOR CUFF REPAIR Right   . TOTAL SHOULDER ARTHROPLASTY      There were no vitals filed for this visit.   Subjective Assessment - 03/07/20 1100    Subjective Ok, always pain in knees and back    Currently in Pain? No/denies    Pain Score 3                              [facility] Adult PT Treatment/Exercise - 03/07/20 0001      High Level Balance   High Level Balance Comments ball toss on airex, side steping on airex       Knee/Hip Exercises: Stretches   Passive Hamstring Stretch Both;4 reps;10 seconds    Other Knee/Hip Stretches gentle lower trunk rotation    Other Knee/Hip Stretches single K2C 3 reps x10 sec      Knee/Hip Exercises: Aerobic   Nustep L 3 x 6 min    Other Aerobic UBE L2 x 3 min       Knee/Hip Exercises: Standing   Forward Step Up Both;2 sets;5 reps;Hand Hold: 0;Step Height: 4"      Knee/Hip Exercises: Seated   Other Seated Knee/Hip Exercises Tband Rows green 2x10. mini sit up with BOSU 2x10     Other Seated Knee/Hip Exercises standing  Shoulder Ext yellow 2x10     Marching Both;2 sets;10 reps    Hamstring Curl Both;2 sets;10 reps    Hamstring Limitations green     Sit to Sand 2 sets;5 reps;with UE support   Airex under LEs     Knee/Hip Exercises: Supine   Bridges 2 sets;10 reps;Strengthening                    PT Short Term Goals - 03/05/20 1102      PT SHORT TERM GOAL #1   Title independent with initial HEP    Status Achieved             PT Long Term Goals - 03/07/20 1130      PT LONG TERM GOAL #1   Title decrease TUG time to 15 seconds    Status On-going      PT LONG TERM GOAL #2   Title increase Berg blance score to 49/56    Status On-going      PT LONG TERM GOAL #3  Title report pain 50% less with cooking    Status Achieved      PT LONG TERM GOAL #4   Title tolerate walking 15 minutes without pain >5/10    Status On-going                 Plan - 03/07/20 1143    Clinical Impression Statement Added mote balance interventions. She has some difficulty with exercises on non compliant surfaces. Good righting reaction with ball toss noted. Some difficulty doing step ups with RLE reports that it feels heavy. Again cues required to keep shoulders down with seated rows.    Stability/Clinical Decision Making Evolving/Moderate complexity    Rehab Potential Good    PT Duration 8 weeks    PT Treatment/Interventions ADLs/Self Care Home Management;Electrical Stimulation;Moist Heat;Gait training;Neuromuscular re-education;Balance training;Therapeutic exercise;Therapeutic activities;Functional mobility training;Stair training;Patient/family education    PT Next Visit Plan work on movements for some pain relief, needs balance training           Patient will benefit from skilled therapeutic intervention in order to improve the following deficits and impairments:  Abnormal gait, Decreased activity tolerance, Decreased balance, Decreased mobility, Decreased strength, Difficulty walking,  Decreased safety awareness, Impaired flexibility  Visit Diagnosis: Acute bilateral low back pain without sciatica  Difficulty in walking, not elsewhere classified  Other abnormalities of gait and mobility     Problem List Patient Active Problem List   Diagnosis Date Noted  . Porokeratosis 11/26/2018  . Corns and callosities 11/26/2018  . History of right shoulder replacement 10/07/2016  . Chronic pain of both knees 05/20/2016  . Osteoarthritis of both hands 05/20/2016  . Essential hypertension 05/20/2016  . Environmental allergies 05/20/2016  . Primary osteoarthritis of both knees 05/19/2016  . Callus of foot 11/24/2012  . Pain in joint, ankle and foot 09/15/2012    Grayce Sessions, PTA 03/07/2020, 11:45 AM  [facility] [address] [place], [name], [address] Phone: [phone]   Fax:  [phone]  Name: Danielle Dean MRN: [id number] Date of Birth: Oct 29, 1928

## 2020-03-09 ENCOUNTER — Encounter: Payer: Self-pay | Admitting: Physical Therapy

## 2020-03-09 ENCOUNTER — Other Ambulatory Visit: Payer: Self-pay

## 2020-03-09 ENCOUNTER — Ambulatory Visit: Payer: Medicare Other | Admitting: Physical Therapy

## 2020-03-09 DIAGNOSIS — R262 Difficulty in walking, not elsewhere classified: Secondary | ICD-10-CM

## 2020-03-09 DIAGNOSIS — R2689 Other abnormalities of gait and mobility: Secondary | ICD-10-CM

## 2020-03-09 DIAGNOSIS — M545 Low back pain, unspecified: Secondary | ICD-10-CM

## 2020-03-09 NOTE — Therapy (Signed)
[facility] [place] [facility] [address] [place], [name], [address] Phone: [phone]   Fax:  [phone]  Physical Therapy Treatment  Patient Details  Name: Danielle Dean MRN: [id number] Date of Birth: December 07, 1928 Referring Provider (PT): Deveshwar   Encounter Date: 03/09/2020   PT End of Session - 03/09/20 1146    Visit Number 7    Date for PT Re-Evaluation 04/24/20    PT Start Time 1100    PT Stop Time 1145    PT Time Calculation (min) 45 min    Activity Tolerance Patient tolerated treatment well    Behavior During Therapy [facility] for tasks assessed/performed           Past Medical History:  Diagnosis Date  . Callus   . Cataract fragments in both eyes following surgery   . Corns and callosities   . Hx of hysterectomy   . Hypertension     Past Surgical History:  Procedure Laterality Date  . ABDOMINAL HYSTERECTOMY    . NASAL SEPTUM SURGERY    . ROTATOR CUFF REPAIR Right   . TOTAL SHOULDER ARTHROPLASTY      There were no vitals filed for this visit.   Subjective Assessment - 03/09/20 1101    Subjective Doing good, she's fine    Currently in Pain? Yes    Pain Score 3     Pain Orientation Left;Right              [facility] PT Assessment - 03/09/20 0001      Timed Up and Go Test   Normal TUG (seconds) 14.94                         [facility] Adult PT Treatment/Exercise - 03/09/20 0001      High Level Balance   High Level Balance Comments ball toss on airex, side steping on airex       Knee/Hip Exercises: Aerobic   Nustep L 3 x 6 min    Other Aerobic UBE L2 x 4 min       Knee/Hip Exercises: Standing   Forward Step Up Both;2 sets;5 reps;Hand Hold: 0;Step Height: 4"    Other Standing Knee Exercises Rows green 2x15, Ext yellow x15      Knee/Hip Exercises: Seated   Hamstring Curl Both;2 sets;10 reps    Hamstring Limitations green     Sit to Sand 2 sets;5 reps;with UE support                     PT Short Term Goals - 03/05/20 1102      PT SHORT TERM GOAL #1   Title independent with initial HEP    Status Achieved             PT Long Term Goals - 03/09/20 1106      PT LONG TERM GOAL #1   Title decrease TUG time to 15 seconds    Status Achieved                 Plan - 03/09/20 1146    Clinical Impression Statement Pt has progressed meeting her TUG goal. Pt reports a low rated knee pain when standing that does not go away. Sone difficulty lifting RLE with step ups remains. Instability noted with all airex interventions. RLE HS tightness noted with passive stretching.    Stability/Clinical Decision Making Evolving/Moderate complexity    Rehab Potential Good  PT Frequency 3x / week    PT Duration 8 weeks    PT Treatment/Interventions ADLs/Self Care Home Management;Electrical Stimulation;Moist Heat;Gait training;Neuromuscular re-education;Balance training;Therapeutic exercise;Therapeutic activities;Functional mobility training;Stair training;Patient/family education    PT Next Visit Plan work on movements for some pain relief, needs balance training           Patient will benefit from skilled therapeutic intervention in order to improve the following deficits and impairments:  Abnormal gait, Decreased activity tolerance, Decreased balance, Decreased mobility, Decreased strength, Difficulty walking, Decreased safety awareness, Impaired flexibility  Visit Diagnosis: Other abnormalities of gait and mobility  Difficulty in walking, not elsewhere classified  Acute bilateral low back pain without sciatica     Problem List Patient Active Problem List   Diagnosis Date Noted  . Porokeratosis 11/26/2018  . Corns and callosities 11/26/2018  . History of right shoulder replacement 10/07/2016  . Chronic pain of both knees 05/20/2016  . Osteoarthritis of both hands 05/20/2016  . Essential hypertension 05/20/2016  . Environmental allergies 05/20/2016  . Primary  osteoarthritis of both knees 05/19/2016  . Callus of foot 11/24/2012  . Pain in joint, ankle and foot 09/15/2012    Grayce Sessions 03/09/2020, 11:49 AM  [facility] [address] [place], [name], [address] Phone: [phone]   Fax:  [phone]  Name: Danielle Dean MRN: [id number] Date of Birth: Apr 02, 1929

## 2020-03-13 ENCOUNTER — Ambulatory Visit: Payer: Medicare Other

## 2020-03-13 ENCOUNTER — Other Ambulatory Visit: Payer: Self-pay

## 2020-03-13 DIAGNOSIS — M545 Low back pain, unspecified: Secondary | ICD-10-CM

## 2020-03-13 DIAGNOSIS — R2689 Other abnormalities of gait and mobility: Secondary | ICD-10-CM

## 2020-03-13 DIAGNOSIS — R262 Difficulty in walking, not elsewhere classified: Secondary | ICD-10-CM

## 2020-03-13 NOTE — Therapy (Signed)
[facility] [place] [facility] [address] [place], [name], [address] Phone: [phone]   Fax:  [phone]  Physical Therapy Treatment  Patient Details  Name: Danielle Dean MRN: [id number] Date of Birth: 1929-02-11 Referring Provider (PT): Deveshwar   Encounter Date: 03/13/2020   PT End of Session - 03/13/20 1115    Visit Number 8    Date for PT Re-Evaluation 04/24/20    PT Start Time 1102    PT Stop Time 1145    PT Time Calculation (min) 43 min    Activity Tolerance Patient tolerated treatment well    Behavior During Therapy WFL for tasks assessed/performed           Past Medical History:  Diagnosis Date   Callus    Cataract fragments in both eyes following surgery    Corns and callosities    Hx of hysterectomy    Hypertension     Past Surgical History:  Procedure Laterality Date   ABDOMINAL HYSTERECTOMY     NASAL SEPTUM SURGERY     ROTATOR CUFF REPAIR Right    TOTAL SHOULDER ARTHROPLASTY      There were no vitals filed for this visit.   Subjective Assessment - 03/13/20 1113    Subjective Pt reports she received her booster on the 25th and felt sick that day, but ok now.    Currently in Pain? Yes    Pain Score 2     Pain Location Knee    Pain Orientation Right;Left                             [facility] Adult PT Treatment/Exercise - 03/13/20 0001      Knee/Hip Exercises: Aerobic   Nustep L5 x 6 min    Other Aerobic UBE L2 x 4 min       Knee/Hip Exercises: Standing   Lateral Step Up Both;2 sets;5 reps    Forward Step Up Both;2 sets;5 reps;Hand Hold: 0;Step Height: 4"    Other Standing Knee Exercises Rows green 2x15, Ext yellow x15      Knee/Hip Exercises: Seated   Hamstring Curl Both;2 sets;10 reps    Hamstring Limitations green     Sit to Sand 1 set;10 reps;with UE support                    PT Short Term Goals - 03/05/20 1102      PT SHORT TERM GOAL #1   Title  independent with initial HEP    Status Achieved             PT Long Term Goals - 03/09/20 1106      PT LONG TERM GOAL #1   Title decrease TUG time to 15 seconds    Status Achieved                 Plan - 03/13/20 1115    Clinical Impression Statement Pt demo good balance during session, but does start to demo impairment in balance during step-ups with onset of fatigue, requiring close SBA (R > L LE). Pt able to increase endurance today with more reps in standing exercises: i.e. sit to stand, step ups forward and lateral    Stability/Clinical Decision Making Evolving/Moderate complexity    Rehab Potential Good    PT Frequency 3x / week    PT Duration 8 weeks    PT Treatment/Interventions ADLs/Self Care  Home Management;Electrical Stimulation;Moist Heat;Gait training;Neuromuscular re-education;Balance training;Therapeutic exercise;Therapeutic activities;Functional mobility training;Stair training;Patient/family education    PT Next Visit Plan work on movements for some pain relief, needs balance training    Consulted and Agree with Plan of Care Patient           Patient will benefit from skilled therapeutic intervention in order to improve the following deficits and impairments:  Abnormal gait, Decreased activity tolerance, Decreased balance, Decreased mobility, Decreased strength, Difficulty walking, Decreased safety awareness, Impaired flexibility  Visit Diagnosis: Other abnormalities of gait and mobility  Difficulty in walking, not elsewhere classified  Acute bilateral low back pain without sciatica     Problem List Patient Active Problem List   Diagnosis Date Noted   Porokeratosis 11/26/2018   Corns and callosities 11/26/2018   History of right shoulder replacement 10/07/2016   Chronic pain of both knees 05/20/2016   Osteoarthritis of both hands 05/20/2016   Essential hypertension 05/20/2016   Environmental allergies 05/20/2016   Primary  osteoarthritis of both knees 05/19/2016   Callus of foot 11/24/2012   Pain in joint, ankle and foot 09/15/2012    Marcelline Mates, PT, DPT 03/13/2020, 11:42 AM  [facility] [place] [facility] [address] [place], [name], [address] Phone: [phone]   Fax:  [phone]  Name: MERIN BORJON MRN: [id number] Date of Birth: Mar 13, 1929

## 2020-03-14 ENCOUNTER — Encounter: Payer: Self-pay | Admitting: Physical Therapy

## 2020-03-14 ENCOUNTER — Ambulatory Visit: Payer: Medicare Other | Admitting: Physical Therapy

## 2020-03-14 DIAGNOSIS — R262 Difficulty in walking, not elsewhere classified: Secondary | ICD-10-CM

## 2020-03-14 DIAGNOSIS — R2689 Other abnormalities of gait and mobility: Secondary | ICD-10-CM

## 2020-03-14 DIAGNOSIS — M545 Low back pain, unspecified: Secondary | ICD-10-CM

## 2020-03-14 NOTE — Therapy (Signed)
[facility] [place] [facility] [address] [place], [name], [address] Phone: [phone]   Fax:  [phone]  Physical Therapy Treatment  Patient Details  Name: Danielle Dean MRN: [id number] Date of Birth: [birth year] Referring Provider (PT): Deveshwar   Encounter Date: 03/14/2020   PT End of Session - 03/14/20 1052    Visit Number 9    Date for PT Re-Evaluation 04/24/20    PT Start Time 1013    PT Stop Time 1054    PT Time Calculation (min) 41 min    Activity Tolerance Patient tolerated treatment well    Behavior During Therapy [facility] for tasks assessed/performed           Past Medical History:  Diagnosis Date  . Callus   . Cataract fragments in both eyes following surgery   . Corns and callosities   . Hx of hysterectomy   . Hypertension     Past Surgical History:  Procedure Laterality Date  . ABDOMINAL HYSTERECTOMY    . NASAL SEPTUM SURGERY    . ROTATOR CUFF REPAIR Right   . TOTAL SHOULDER ARTHROPLASTY      There were no vitals filed for this visit.   Subjective Assessment - 03/14/20 1012    Subjective "today I can say fine"    Currently in Pain? Yes    Pain Score 2     Pain Location Knee                             [facility] Adult PT Treatment/Exercise - 03/14/20 0001      Knee/Hip Exercises: Aerobic   Nustep L3 x 6 min    Other Aerobic UBE L2 x 4 min       Knee/Hip Exercises: Standing   Walking with Sports Cord 10lb 4 way x 3 each       Knee/Hip Exercises: Seated   Other Seated Knee/Hip Exercises Tband Rows green 2x15. mini sit up with BOSU 2x10     Sit to Sand 1 set;10 reps;with UE support      Knee/Hip Exercises: Supine   Bridges Both;15 reps;1 set;Strengthening    Other Supine Knee/Hip Exercises Feet on ball K2C, small bridges (pain so back tracked to PPT then pt able to perform x 12 without pain), trunk rot'ns    Other Supine Knee/Hip Exercises ab sets 2x10                    PT  Short Term Goals - 03/05/20 1102      PT SHORT TERM GOAL #1   Title independent with initial HEP    Status Achieved             PT Long Term Goals - 03/09/20 1106      PT LONG TERM GOAL #1   Title decrease TUG time to 15 seconds    Status Achieved                 Plan - 03/14/20 1053    Clinical Impression Statement Some instability with resisted side steps. No reports of pain reported throughout session. Some fatigue reported with sit to stands. Postural cues required with seated rows not to sway trunk. Some core weakness with mini sit up and supine interventions    Stability/Clinical Decision Making Evolving/Moderate complexity    Rehab Potential Good    PT Frequency 3x / week    PT  Treatment/Interventions ADLs/Self Care Home Management;Electrical Stimulation;Moist Heat;Gait training;Neuromuscular re-education;Balance training;Therapeutic exercise;Therapeutic activities;Functional mobility training;Stair training;Patient/family education    PT Next Visit Plan work on movements for some pain relief, needs balance training           Patient will benefit from skilled therapeutic intervention in order to improve the following deficits and impairments:  Abnormal gait, Decreased activity tolerance, Decreased balance, Decreased mobility, Decreased strength, Difficulty walking, Decreased safety awareness, Impaired flexibility  Visit Diagnosis: Acute bilateral low back pain without sciatica  Difficulty in walking, not elsewhere classified  Other abnormalities of gait and mobility     Problem List Patient Active Problem List   Diagnosis Date Noted  . Porokeratosis 11/26/2018  . Corns and callosities 11/26/2018  . History of right shoulder replacement 10/07/2016  . Chronic pain of both knees 05/20/2016  . Osteoarthritis of both hands 05/20/2016  . Essential hypertension 05/20/2016  . Environmental allergies 05/20/2016  . Primary osteoarthritis of both knees  05/19/2016  . Callus of foot 11/24/2012  . Pain in joint, ankle and foot 09/15/2012    Grayce Sessions, PTA 03/14/2020, 10:56 AM  [facility] [address] [place], [name], [address] Phone: [phone]   Fax:  [phone]  Name: Danielle Dean MRN: [id number] Date of Birth: Jan 25, 1929

## 2020-03-15 ENCOUNTER — Ambulatory Visit (INDEPENDENT_AMBULATORY_CARE_PROVIDER_SITE_OTHER): Payer: Medicare Other | Admitting: Rheumatology

## 2020-03-15 ENCOUNTER — Encounter: Payer: Self-pay | Admitting: Rheumatology

## 2020-03-15 ENCOUNTER — Other Ambulatory Visit: Payer: Self-pay

## 2020-03-15 VITALS — BP 148/68 | HR 53 | Resp 13 | Ht 60.0 in | Wt 102.6 lb

## 2020-03-15 DIAGNOSIS — Z96611 Presence of right artificial shoulder joint: Secondary | ICD-10-CM | POA: Diagnosis not present

## 2020-03-15 DIAGNOSIS — R29890 Loss of height: Secondary | ICD-10-CM

## 2020-03-15 DIAGNOSIS — M170 Bilateral primary osteoarthritis of knee: Secondary | ICD-10-CM | POA: Diagnosis not present

## 2020-03-15 DIAGNOSIS — M19041 Primary osteoarthritis, right hand: Secondary | ICD-10-CM

## 2020-03-15 DIAGNOSIS — M8589 Other specified disorders of bone density and structure, multiple sites: Secondary | ICD-10-CM

## 2020-03-15 DIAGNOSIS — M5136 Other intervertebral disc degeneration, lumbar region: Secondary | ICD-10-CM

## 2020-03-15 DIAGNOSIS — M4125 Other idiopathic scoliosis, thoracolumbar region: Secondary | ICD-10-CM

## 2020-03-15 DIAGNOSIS — M25512 Pain in left shoulder: Secondary | ICD-10-CM

## 2020-03-15 DIAGNOSIS — M19042 Primary osteoarthritis, left hand: Secondary | ICD-10-CM

## 2020-03-15 DIAGNOSIS — G8929 Other chronic pain: Secondary | ICD-10-CM

## 2020-03-15 DIAGNOSIS — Z7189 Other specified counseling: Secondary | ICD-10-CM

## 2020-03-15 DIAGNOSIS — M5134 Other intervertebral disc degeneration, thoracic region: Secondary | ICD-10-CM

## 2020-03-15 DIAGNOSIS — Z8679 Personal history of other diseases of the circulatory system: Secondary | ICD-10-CM

## 2020-03-19 ENCOUNTER — Ambulatory Visit: Payer: Medicare Other | Attending: Rheumatology | Admitting: Physical Therapy

## 2020-03-19 ENCOUNTER — Other Ambulatory Visit: Payer: Self-pay

## 2020-03-19 ENCOUNTER — Encounter: Payer: Self-pay | Admitting: Physical Therapy

## 2020-03-19 DIAGNOSIS — R262 Difficulty in walking, not elsewhere classified: Secondary | ICD-10-CM | POA: Diagnosis present

## 2020-03-19 DIAGNOSIS — M545 Low back pain, unspecified: Secondary | ICD-10-CM | POA: Diagnosis present

## 2020-03-19 DIAGNOSIS — R2689 Other abnormalities of gait and mobility: Secondary | ICD-10-CM | POA: Diagnosis present

## 2020-03-19 NOTE — Therapy (Addendum)
[facility] [place] [facility] [address] [place], [name], [address] Phone: [phone]   Fax:  [phone] Progress Note Reporting Period 02/23/20 to 03/19/20 for the first 10 visits  See note below for Objective Data and Assessment of Progress/Goals.      Physical Therapy Treatment  Patient Details  Name: Danielle Dean MRN: [id number] Date of Birth: 10/22/1928 Referring Provider (PT): Deveshwar   Encounter Date: 03/19/2020   PT End of Session - 03/19/20 1100    Visit Number 10    Date for PT Re-Evaluation 04/24/20    PT Start Time 1015    PT Stop Time 1100    PT Time Calculation (min) 45 min    Activity Tolerance Patient tolerated treatment well    Behavior During Therapy [facility] for tasks assessed/performed           Past Medical History:  Diagnosis Date  . Callus   . Cataract fragments in both eyes following surgery   . Corns and callosities   . Hx of hysterectomy   . Hypertension     Past Surgical History:  Procedure Laterality Date  . ABDOMINAL HYSTERECTOMY    . NASAL SEPTUM SURGERY    . ROTATOR CUFF REPAIR Right   . TOTAL SHOULDER ARTHROPLASTY      There were no vitals filed for this visit.   Subjective Assessment - 03/19/20 1017    Subjective "Fine"    Currently in Pain? --   "Pain I have, can't give number"                            [facility] Adult PT Treatment/Exercise - 03/19/20 0001      Knee/Hip Exercises: Aerobic   Nustep L3 x 6 min    Other Aerobic UBE L2 x 3 min each      Knee/Hip Exercises: Standing   Lateral Step Up Both;2 sets;5 reps    Forward Step Up Both;2 sets;5 reps;Hand Hold: 0;Step Height: 4"    Other Standing Knee Exercises Row red 2x10  & Ext red x10       Knee/Hip Exercises: Seated   Long Arc Quad Both;2 sets;5 reps    Long Arc Quad Weight 2 lbs.    Marching Both;10 reps;1 set    Marching Limitations 2    Hamstring Curl Both;2 sets;10 reps    Hamstring Limitations red  band       Knee/Hip Exercises: Supine   Other Supine Knee/Hip Exercises Feet on ball K2C, small bridges (pain so back tracked to PPT then pt able to perform x 12 without pain), trunk rot'ns    Other Supine Knee/Hip Exercises ab sets 2x10                    PT Short Term Goals - 03/05/20 1102      PT SHORT TERM GOAL #1   Title independent with initial HEP    Status Achieved             PT Long Term Goals - 03/09/20 1106      PT LONG TERM GOAL #1   Title decrease TUG time to 15 seconds    Status Achieved                 Plan - 03/19/20 1104    Clinical Impression Statement Pt reports a low constant pain when standing due to old age. She did  well with step ups but has a slight increase in knee pain and fatigue. UE did fatigue during the second set of shoulder extensions. Some core weakness present with supine interventions.    Stability/Clinical Decision Making Evolving/Moderate complexity    Rehab Potential Good    PT Frequency 3x / week    PT Duration 8 weeks    PT Next Visit Plan work on movements for some pain relief, needs balance training    PT Home Exercise Plan S2S, Rows, shoulder Extensions           Patient will benefit from skilled therapeutic intervention in order to improve the following deficits and impairments:  Abnormal gait, Decreased activity tolerance, Decreased balance, Decreased mobility, Decreased strength, Difficulty walking, Decreased safety awareness, Impaired flexibility  Visit Diagnosis: Difficulty in walking, not elsewhere classified  Other abnormalities of gait and mobility  Acute bilateral low back pain without sciatica     Problem List Patient Active Problem List   Diagnosis Date Noted  . Porokeratosis 11/26/2018  . Corns and callosities 11/26/2018  . History of right shoulder replacement 10/07/2016  . Chronic pain of both knees 05/20/2016  . Osteoarthritis of both hands 05/20/2016  . Essential hypertension  05/20/2016  . Environmental allergies 05/20/2016  . Primary osteoarthritis of both knees 05/19/2016  . Callus of foot 11/24/2012  . Pain in joint, ankle and foot 09/15/2012    Grayce Sessions, PTA 03/19/2020, 11:06 AM  [facility] [place] [facility] [address] [place], [name], [address] Phone: [phone]   Fax:  [phone]  Name: Danielle Dean MRN: [id number] Date of Birth: 1929/01/16

## 2020-03-21 ENCOUNTER — Ambulatory Visit: Payer: Medicare Other | Admitting: Physical Therapy

## 2020-03-21 ENCOUNTER — Other Ambulatory Visit: Payer: Self-pay

## 2020-03-21 ENCOUNTER — Encounter: Payer: Self-pay | Admitting: Physical Therapy

## 2020-03-21 DIAGNOSIS — R262 Difficulty in walking, not elsewhere classified: Secondary | ICD-10-CM

## 2020-03-21 DIAGNOSIS — R2689 Other abnormalities of gait and mobility: Secondary | ICD-10-CM

## 2020-03-21 DIAGNOSIS — M545 Low back pain, unspecified: Secondary | ICD-10-CM

## 2020-03-21 NOTE — Therapy (Signed)
[facility] [place] [facility] [address] [place], [name], [address] Phone: [phone]   Fax:  [phone]  Physical Therapy Treatment  Patient Details  Name: Danielle Dean MRN: [id number] Date of Birth: 1929/03/19 Referring Provider (PT): Deveshwar   Encounter Date: 03/21/2020   PT End of Session - 03/21/20 1055    Visit Number 11    Date for PT Re-Evaluation 04/24/20    PT Start Time 1015    PT Stop Time 1058    PT Time Calculation (min) 43 min    Activity Tolerance Patient tolerated treatment well    Behavior During Therapy [facility] for tasks assessed/performed           Past Medical History:  Diagnosis Date  . Callus   . Cataract fragments in both eyes following surgery   . Corns and callosities   . Hx of hysterectomy   . Hypertension     Past Surgical History:  Procedure Laterality Date  . ABDOMINAL HYSTERECTOMY    . NASAL SEPTUM SURGERY    . ROTATOR CUFF REPAIR Right   . TOTAL SHOULDER ARTHROPLASTY      There were no vitals filed for this visit.   Subjective Assessment - 03/21/20 1017    Subjective "Im fine never without pain"    Currently in Pain? Yes    Pain Score 3     Pain Location --   knees and back                            [facility] Adult PT Treatment/Exercise - 03/21/20 0001      High Level Balance   High Level Balance Comments Side step on and off aires 2x5 each       Knee/Hip Exercises: Aerobic   Nustep L3 x 6 min    Other Aerobic UBE L2 x 3 min each      Knee/Hip Exercises: Standing   Forward Step Up Both;5 reps;1 set;Hand Hold: 1;Hand Hold: 0;Step Height: 6"   3 reps     Knee/Hip Exercises: Seated   Long Arc Quad Both;2 sets;10 reps;Strengthening    Long Arc Quad Weight 2 lbs.    Hamstring Curl Both;2 sets;10 reps    Hamstring Limitations green     Sit to Sand 2 sets;5 reps;without UE support      Knee/Hip Exercises: Supine   Other Supine Knee/Hip Exercises Feet on ball K2C,  small bridges    Other Supine Knee/Hip Exercises ab sets 2x10                    PT Short Term Goals - 03/05/20 1102      PT SHORT TERM GOAL #1   Title independent with initial HEP    Status Achieved             PT Long Term Goals - 03/21/20 1022      PT LONG TERM GOAL #3   Title report pain 50% less with cooking    Status Achieved      PT LONG TERM GOAL #4   Title tolerate walking 15 minutes without pain >5/10    Status On-going      PT LONG TERM GOAL #5   Title independent and safe with advance HEP and balance    Status Achieved                 Plan - 03/21/20  1055    Clinical Impression Statement Pt did well overall. She was able to performed alt 6in step up but required increase time. 6 inch step up was difficult for patient requiring UE assist. Core weakness remains with supine interventions. Cues for full TKE with LAQ.    Stability/Clinical Decision Making Evolving/Moderate complexity    Rehab Potential Good    PT Frequency 3x / week    PT Duration 8 weeks    PT Treatment/Interventions ADLs/Self Care Home Management;Electrical Stimulation;Moist Heat;Gait training;Neuromuscular re-education;Balance training;Therapeutic exercise;Therapeutic activities;Functional mobility training;Stair training;Patient/family education    PT Next Visit Plan work on movements for some pain relief, needs balance training           Patient will benefit from skilled therapeutic intervention in order to improve the following deficits and impairments:  Abnormal gait, Decreased activity tolerance, Decreased balance, Decreased mobility, Decreased strength, Difficulty walking, Decreased safety awareness, Impaired flexibility  Visit Diagnosis: Other abnormalities of gait and mobility  Difficulty in walking, not elsewhere classified  Acute bilateral low back pain without sciatica     Problem List Patient Active Problem List   Diagnosis Date Noted  . Porokeratosis  11/26/2018  . Corns and callosities 11/26/2018  . History of right shoulder replacement 10/07/2016  . Chronic pain of both knees 05/20/2016  . Osteoarthritis of both hands 05/20/2016  . Essential hypertension 05/20/2016  . Environmental allergies 05/20/2016  . Primary osteoarthritis of both knees 05/19/2016  . Callus of foot 11/24/2012  . Pain in joint, ankle and foot 09/15/2012    Grayce Sessions, PTA 03/21/2020, 11:00 AM  [facility] [place] [facility] [address] [place], [name], [address] Phone: [phone]   Fax:  [phone]  Name: Danielle Dean MRN: [id number] Date of Birth: 03-Apr-1929

## 2020-03-28 ENCOUNTER — Ambulatory Visit: Payer: Medicare Other | Admitting: Physical Therapy

## 2020-03-28 ENCOUNTER — Other Ambulatory Visit: Payer: Self-pay

## 2020-03-28 ENCOUNTER — Encounter: Payer: Self-pay | Admitting: Physical Therapy

## 2020-03-28 DIAGNOSIS — R2689 Other abnormalities of gait and mobility: Secondary | ICD-10-CM

## 2020-03-28 DIAGNOSIS — R262 Difficulty in walking, not elsewhere classified: Secondary | ICD-10-CM

## 2020-03-28 DIAGNOSIS — M545 Low back pain, unspecified: Secondary | ICD-10-CM

## 2020-03-28 NOTE — Therapy (Signed)
[place]. [place], [place], [address] Phone: [phone]   Fax:  [phone]  Physical Therapy Treatment  Patient Details  Name: Danielle Dean MRN: [id number] Date of Birth: 1929/04/24 Referring Provider (PT): Deveshwar   Encounter Date: 03/28/2020   PT End of Session - 03/28/20 1052    Visit Number 12    Date for PT Re-Evaluation 04/24/20    PT Start Time 9417    PT Stop Time 1100    PT Time Calculation (min) 45 min    Activity Tolerance Patient tolerated treatment well    Behavior During Therapy [facility] for tasks assessed/performed           Past Medical History:  Diagnosis Date  . Callus   . Cataract fragments in both eyes following surgery   . Corns and callosities   . Hx of hysterectomy   . Hypertension     Past Surgical History:  Procedure Laterality Date  . ABDOMINAL HYSTERECTOMY    . NASAL SEPTUM SURGERY    . ROTATOR CUFF REPAIR Right   . TOTAL SHOULDER ARTHROPLASTY      There were no vitals filed for this visit.   Subjective Assessment - 03/28/20 1014    Subjective "fine"    Currently in Pain? Yes    Pain Score 3     Pain Location Knee    Pain Orientation Left;Right              [facility] PT Assessment - 03/28/20 0001      Berg Balance Test   Sit to Stand Able to stand without using hands and stabilize independently    Standing Unsupported Able to stand safely 2 minutes    Sitting with Back Unsupported but Feet Supported on Floor or Stool Able to sit safely and securely 2 minutes    Stand to Sit Sits safely with minimal use of hands    Transfers Able to transfer safely, minor use of hands    Standing Unsupported with Eyes Closed Able to stand 10 seconds safely    Standing Unsupported with Feet Together Able to place feet together independently and stand 1 minute safely    From Standing, Reach Forward with Outstretched Arm Can reach forward >12 cm safely (5")    From Standing  Position, Pick up Object from Floor Able to pick up shoe safely and easily    From Standing Position, Turn to Look Behind Over each Shoulder Looks behind one side only/other side shows less weight shift    Turn 360 Degrees Able to turn 360 degrees safely in 4 seconds or less    Standing Unsupported, Alternately Place Feet on Step/Stool Able to stand independently and safely and complete 8 steps in 20 seconds    Standing Unsupported, One Foot in Front Needs help to step but can hold 15 seconds    Standing on One Leg Unable to try or needs assist to prevent fall    Total Score 47                         [facility] Adult PT Treatment/Exercise - 03/28/20 0001      Knee/Hip Exercises: Aerobic   Nustep L3 x 7 min    Other Aerobic UBE L2 x 3 min each      Knee/Hip Exercises: Standing   Walking with Sports Cord 10lb 4 way x 3 each     Other  Standing Knee Exercises Rows green 2x1      Knee/Hip Exercises: Seated   Hamstring Curl Both;2 sets;10 reps    Hamstring Limitations green     Sit to Sand 1 set;10 reps;without UE support   Forward press with red ball      Knee/Hip Exercises: Supine   Other Supine Knee/Hip Exercises Feet on ball K2C, small bridges                    PT Short Term Goals - 03/05/20 1102      PT SHORT TERM GOAL #1   Title independent with initial HEP    Status Achieved             PT Long Term Goals - 03/28/20 1038      PT LONG TERM GOAL #2   Title increase Berg blance score to 49/56    Status Partially Met   47/56                Plan - 03/28/20 1053    Clinical Impression Statement Pt has progressed increasing her BERG balance score. She has the most difficulty with SLS. Cues needed to keep his square with resisted side steps. Some increase in knee pain with sit to stands. Cues needed for core engagement with supine interventions.    Stability/Clinical Decision Making Evolving/Moderate complexity    Rehab Potential Good    PT  Duration 8 weeks    PT Treatment/Interventions ADLs/Self Care Home Management;Electrical Stimulation;Moist Heat;Gait training;Neuromuscular re-education;Balance training;Therapeutic exercise;Therapeutic activities;Functional mobility training;Stair training;Patient/family education    PT Next Visit Plan work on movements for some pain relief, needs balance training           Patient will benefit from skilled therapeutic intervention in order to improve the following deficits and impairments:  Abnormal gait, Decreased activity tolerance, Decreased balance, Decreased mobility, Decreased strength, Difficulty walking, Decreased safety awareness, Impaired flexibility  Visit Diagnosis: Acute bilateral low back pain without sciatica  Other abnormalities of gait and mobility  Difficulty in walking, not elsewhere classified     Problem List Patient Active Problem List   Diagnosis Date Noted  . Porokeratosis 11/26/2018  . Corns and callosities 11/26/2018  . History of right shoulder replacement 10/07/2016  . Chronic pain of both knees 05/20/2016  . Osteoarthritis of both hands 05/20/2016  . Essential hypertension 05/20/2016  . Environmental allergies 05/20/2016  . Primary osteoarthritis of both knees 05/19/2016  . Callus of foot 11/24/2012  . Pain in joint, ankle and foot 09/15/2012    Scot Jun, PTA 03/28/2020, 10:55 AM  [place]. [place], [place], [address] Phone: [phone]   Fax:  [phone]  Name: Danielle Dean MRN: [id number] Date of Birth: Apr 26, 1929

## 2020-03-29 ENCOUNTER — Ambulatory Visit: Payer: Medicare Other | Admitting: Physical Therapy

## 2020-03-29 ENCOUNTER — Encounter: Payer: Self-pay | Admitting: Physical Therapy

## 2020-03-29 DIAGNOSIS — R2689 Other abnormalities of gait and mobility: Secondary | ICD-10-CM

## 2020-03-29 DIAGNOSIS — R262 Difficulty in walking, not elsewhere classified: Secondary | ICD-10-CM

## 2020-03-29 DIAGNOSIS — M545 Low back pain, unspecified: Secondary | ICD-10-CM

## 2020-03-29 NOTE — Therapy (Signed)
[place]. [place], [place], [address] Phone: [phone]   Fax:  [phone]  Physical Therapy Treatment  Patient Details  Name: Danielle Dean MRN: [id number] Date of Birth: [birth year] Referring Provider (PT): Deveshwar   Encounter Date: 03/29/2020   PT End of Session - 03/29/20 1231    Visit Number 13    Date for PT Re-Evaluation 04/24/20    PT Start Time 1145    PT Stop Time 1230    PT Time Calculation (min) 45 min    Activity Tolerance Patient tolerated treatment well    Behavior During Therapy [facility] for tasks assessed/performed           Past Medical History:  Diagnosis Date  . Callus   . Cataract fragments in both eyes following surgery   . Corns and callosities   . Hx of hysterectomy   . Hypertension     Past Surgical History:  Procedure Laterality Date  . ABDOMINAL HYSTERECTOMY    . NASAL SEPTUM SURGERY    . ROTATOR CUFF REPAIR Right   . TOTAL SHOULDER ARTHROPLASTY      There were no vitals filed for this visit.   Subjective Assessment - 03/29/20 1143    Subjective "I feel fine"    Currently in Pain? Yes    Pain Score 3     Pain Location --   knees and back                            [facility] Adult PT Treatment/Exercise - 03/29/20 0001      High Level Balance   High Level Balance Activities Negotiating over obstacles;Negotitating around obstacles    High Level Balance Comments forward ans side step over objects       Knee/Hip Exercises: Aerobic   Recumbent Bike x1 min     Nustep L3 x 6 min      Knee/Hip Exercises: Standing   Forward Step Up Both;1 set;5 reps;Hand Hold: 2;Step Height: 6"    Other Standing Knee Exercises Shoulder Ext red 2x10    Other Standing Knee Exercises Rows green 2x15      Knee/Hip Exercises: Seated   Long Arc Quad Both;2 sets;10 reps;Strengthening    Long Arc Quad Weight 2 lbs.    Hamstring Curl Both;2 sets;15 reps    Hamstring  Limitations red      Knee/Hip Exercises: Supine   Bridges 2 sets;Both;10 reps    Bridges with [employer] Both;1 set;10 reps    Other Supine Knee/Hip Exercises ball squeeze 2x10                     PT Short Term Goals - 03/05/20 1102      PT SHORT TERM GOAL #1   Title independent with initial HEP    Status Achieved             PT Long Term Goals - 03/28/20 1038      PT LONG TERM GOAL #2   Title increase Berg blance score to 49/56    Status Partially Met   47/56                Plan - 03/29/20 1231    Clinical Impression Statement Pt voiced that she wanted to try the recumbent bike but has to stop due to knee pain. She still has difficulty with the 6 inch step  up due to weakness. Postural cue needed for standing rows and extensions. SBA needed for all balance activities.    Stability/Clinical Decision Making Evolving/Moderate complexity    Rehab Potential Good    PT Frequency 3x / week    PT Duration 8 weeks    PT Treatment/Interventions ADLs/Self Care Home Management;Electrical Stimulation;Moist Heat;Gait training;Neuromuscular re-education;Balance training;Therapeutic exercise;Therapeutic activities;Functional mobility training;Stair training;Patient/family education    PT Next Visit Plan work on movements for some pain relief, needs balance training           Patient will benefit from skilled therapeutic intervention in order to improve the following deficits and impairments:  Abnormal gait, Decreased activity tolerance, Decreased balance, Decreased mobility, Decreased strength, Difficulty walking, Decreased safety awareness, Impaired flexibility  Visit Diagnosis: Acute bilateral low back pain without sciatica  Other abnormalities of gait and mobility  Difficulty in walking, not elsewhere classified     Problem List Patient Active Problem List   Diagnosis Date Noted  . Porokeratosis 11/26/2018  . Corns and callosities 11/26/2018  . History  of right shoulder replacement 10/07/2016  . Chronic pain of both knees 05/20/2016  . Osteoarthritis of both hands 05/20/2016  . Essential hypertension 05/20/2016  . Environmental allergies 05/20/2016  . Primary osteoarthritis of both knees 05/19/2016  . Callus of foot 11/24/2012  . Pain in joint, ankle and foot 09/15/2012    Scot Jun 03/29/2020, 12:33 PM  [place]. [place], [place], [address] Phone: [phone]   Fax:  [phone]  Name: Danielle Dean MRN: [id number] Date of Birth: July 13, 1928

## 2020-04-02 ENCOUNTER — Other Ambulatory Visit: Payer: Self-pay

## 2020-04-02 ENCOUNTER — Ambulatory Visit: Payer: Medicare Other | Admitting: Physical Therapy

## 2020-04-02 ENCOUNTER — Encounter: Payer: Self-pay | Admitting: Physical Therapy

## 2020-04-02 DIAGNOSIS — R262 Difficulty in walking, not elsewhere classified: Secondary | ICD-10-CM

## 2020-04-02 DIAGNOSIS — M545 Low back pain, unspecified: Secondary | ICD-10-CM

## 2020-04-02 DIAGNOSIS — R2689 Other abnormalities of gait and mobility: Secondary | ICD-10-CM

## 2020-04-02 NOTE — Therapy (Signed)
[place]. [place], [place], [address] Phone: [phone]   Fax:  [phone]  Physical Therapy Treatment  Patient Details  Name: KEYSHIA ORWICK MRN: [id number] Date of Birth: 07-03-1928 Referring Provider (PT): Deveshwar   Encounter Date: 04/02/2020    Past Medical History:  Diagnosis Date  . Callus   . Cataract fragments in both eyes following surgery   . Corns and callosities   . Hx of hysterectomy   . Hypertension     Past Surgical History:  Procedure Laterality Date  . ABDOMINAL HYSTERECTOMY    . NASAL SEPTUM SURGERY    . ROTATOR CUFF REPAIR Right   . TOTAL SHOULDER ARTHROPLASTY      There were no vitals filed for this visit.   Subjective Assessment - 04/02/20 1017    Subjective Pt reports that she is fine    Currently in Pain? Yes    Pain Location Knee    Pain Orientation Left;Right                             [facility] Adult PT Treatment/Exercise - 04/02/20 0001      Knee/Hip Exercises: Stretches   Passive Hamstring Stretch Both;4 reps;10 seconds    Other Knee/Hip Stretches Single K2C both 10 sec x 3      Knee/Hip Exercises: Aerobic   Nustep L4 x 7 min      Knee/Hip Exercises: Machines for Strengthening   Cybex Knee Extension x10 no weight some LLE pain     Cybex Knee Flexion 15lb 2x10    Other Machine Rows 5lb Lats 10lb 2x10       Knee/Hip Exercises: Standing   Other Standing Knee Exercises Step ups on airex x10 each      Knee/Hip Exercises: Seated   Long Arc Quad Both;2 sets;10 reps;Strengthening    Long Arc Quad Weight 2 lbs.    Sit to Sand 2 sets;10 reps;without UE support      Knee/Hip Exercises: Supine   Bridges 2 sets;Both;10 reps    Bridges with [employer] Both;10 reps;2 sets                    PT Short Term Goals - 03/05/20 1102      PT SHORT TERM GOAL #1   Title independent with initial HEP    Status Achieved             PT  Long Term Goals - 03/28/20 1038      PT LONG TERM GOAL #2   Title increase Berg blance score to 49/56    Status Partially Met   47/56                Plan - 04/02/20 1057    Clinical Impression Statement Pt did well today. Progressed to some machine level postural strengthening under light resistance. Some knee pain when attempting leg ext on machine. CGA needed at times with step ups on airex. Bilat HS tightness noted with passive stretching.    Stability/Clinical Decision Making Evolving/Moderate complexity    Rehab Potential Good    PT Frequency 3x / week    PT Duration 8 weeks    PT Treatment/Interventions ADLs/Self Care Home Management;Electrical Stimulation;Moist Heat;Gait training;Neuromuscular re-education;Balance training;Therapeutic exercise;Therapeutic activities;Functional mobility training;Stair training;Patient/family education    PT Next Visit Plan work on movements for some pain relief, needs balance training  Patient will benefit from skilled therapeutic intervention in order to improve the following deficits and impairments:  Abnormal gait, Decreased activity tolerance, Decreased balance, Decreased mobility, Decreased strength, Difficulty walking, Decreased safety awareness, Impaired flexibility  Visit Diagnosis: Acute bilateral low back pain without sciatica  Other abnormalities of gait and mobility  Difficulty in walking, not elsewhere classified     Problem List Patient Active Problem List   Diagnosis Date Noted  . Porokeratosis 11/26/2018  . Corns and callosities 11/26/2018  . History of right shoulder replacement 10/07/2016  . Chronic pain of both knees 05/20/2016  . Osteoarthritis of both hands 05/20/2016  . Essential hypertension 05/20/2016  . Environmental allergies 05/20/2016  . Primary osteoarthritis of both knees 05/19/2016  . Callus of foot 11/24/2012  . Pain in joint, ankle and foot 09/15/2012    Scot Jun,  PTA 04/02/2020, 10:59 AM  [place]. [place], [place], [address] Phone: [phone]   Fax:  [phone]  Name: SHOUA ULLOA MRN: [id number] Date of Birth: 1929/03/12

## 2020-04-04 ENCOUNTER — Other Ambulatory Visit: Payer: Self-pay

## 2020-04-04 ENCOUNTER — Encounter: Payer: Self-pay | Admitting: Physical Therapy

## 2020-04-04 ENCOUNTER — Ambulatory Visit: Payer: Medicare Other | Admitting: Physical Therapy

## 2020-04-04 DIAGNOSIS — M545 Low back pain, unspecified: Secondary | ICD-10-CM

## 2020-04-04 DIAGNOSIS — R262 Difficulty in walking, not elsewhere classified: Secondary | ICD-10-CM | POA: Diagnosis not present

## 2020-04-04 DIAGNOSIS — R2689 Other abnormalities of gait and mobility: Secondary | ICD-10-CM

## 2020-04-04 NOTE — Therapy (Signed)
[place]. [place], [place], [address] Phone: [phone]   Fax:  [phone]  Physical Therapy Treatment  Patient Details  Name: Danielle Dean MRN: [id number] Date of Birth: 1928-06-20 Referring Provider (PT): Deveshwar   Encounter Date: 04/04/2020   PT End of Session - 04/04/20 1056    Visit Number 15    Date for PT Re-Evaluation 04/24/20    PT Start Time 6962    PT Stop Time 1057    PT Time Calculation (min) 42 min    Activity Tolerance Patient tolerated treatment well    Behavior During Therapy [facility] for tasks assessed/performed           Past Medical History:  Diagnosis Date  . Callus   . Cataract fragments in both eyes following surgery   . Corns and callosities   . Hx of hysterectomy   . Hypertension     Past Surgical History:  Procedure Laterality Date  . ABDOMINAL HYSTERECTOMY    . NASAL SEPTUM SURGERY    . ROTATOR CUFF REPAIR Right   . TOTAL SHOULDER ARTHROPLASTY      There were no vitals filed for this visit.   Subjective Assessment - 04/04/20 1019    Subjective Feeling fine    Limitations Lifting;Standing;Walking;House hold activities    Currently in Pain? Yes    Pain Location Knee    Pain Orientation Right;Left                             [facility] Adult PT Treatment/Exercise - 04/04/20 0001      Knee/Hip Exercises: Aerobic   Nustep L4 x 7 min      Knee/Hip Exercises: Machines for Strengthening   Other Machine Rows 5lb Lats 10lb 2x10       Knee/Hip Exercises: Standing   Walking with Sports Cord 10lb 4 way x 3 each     Other Standing Knee Exercises Standing marches 3lb 2x10       Knee/Hip Exercises: Seated   Long Arc Quad Both;2 sets;10 reps;Strengthening    Long Arc Quad Weight 3 lbs.    Hamstring Curl Both;2 sets;15 reps    Hamstring Limitations red      Knee/Hip Exercises: Supine   Other Supine Knee/Hip Exercises Feet on ball K2C, small bridges     Other Supine Knee/Hip Exercises ab sets 2x10                    PT Short Term Goals - 03/05/20 1102      PT SHORT TERM GOAL #1   Title independent with initial HEP    Status Achieved             PT Long Term Goals - 03/28/20 1038      PT LONG TERM GOAL #2   Title increase Berg blance score to 49/56    Status Partially Met   47/56                Plan - 04/04/20 1056    Clinical Impression Statement Good carryover from last session. Tactile cues needed with seated rows to prevent trunk sway. Some core weakness noted with supine bridges. She had some instability with resisted side steps. Cue needed to increase hip flexion with standing marches.    Stability/Clinical Decision Making Evolving/Moderate complexity    Rehab Potential Good    PT Frequency 3x /  week    PT Treatment/Interventions ADLs/Self Care Home Management;Electrical Stimulation;Moist Heat;Gait training;Neuromuscular re-education;Balance training;Therapeutic exercise;Therapeutic activities;Functional mobility training;Stair training;Patient/family education    PT Next Visit Plan work on movements for some pain relief, needs balance training           Patient will benefit from skilled therapeutic intervention in order to improve the following deficits and impairments:  Abnormal gait, Decreased activity tolerance, Decreased balance, Decreased mobility, Decreased strength, Difficulty walking, Decreased safety awareness, Impaired flexibility  Visit Diagnosis: Difficulty in walking, not elsewhere classified  Other abnormalities of gait and mobility  Acute bilateral low back pain without sciatica     Problem List Patient Active Problem List   Diagnosis Date Noted  . Porokeratosis 11/26/2018  . Corns and callosities 11/26/2018  . History of right shoulder replacement 10/07/2016  . Chronic pain of both knees 05/20/2016  . Osteoarthritis of both hands 05/20/2016  . Essential hypertension  05/20/2016  . Environmental allergies 05/20/2016  . Primary osteoarthritis of both knees 05/19/2016  . Callus of foot 11/24/2012  . Pain in joint, ankle and foot 09/15/2012    Scot Jun, PTA 04/04/2020, 10:58 AM  [place]. [place], [place], [address] Phone: [phone]   Fax:  [phone]  Name: Danielle Dean MRN: [id number] Date of Birth: [birth year]

## 2020-04-09 ENCOUNTER — Encounter: Payer: Self-pay | Admitting: Physical Therapy

## 2020-04-09 ENCOUNTER — Ambulatory Visit: Payer: Medicare Other | Admitting: Physical Therapy

## 2020-04-09 ENCOUNTER — Other Ambulatory Visit: Payer: Self-pay

## 2020-04-09 DIAGNOSIS — R262 Difficulty in walking, not elsewhere classified: Secondary | ICD-10-CM

## 2020-04-09 DIAGNOSIS — R2689 Other abnormalities of gait and mobility: Secondary | ICD-10-CM

## 2020-04-09 DIAGNOSIS — M545 Low back pain, unspecified: Secondary | ICD-10-CM

## 2020-04-09 NOTE — Therapy (Signed)
[place]. [place], [place], [address] Phone: [phone]   Fax:  [phone]  Physical Therapy Treatment  Patient Details  Name: Danielle Dean MRN: [id number] Date of Birth: [birth year] Referring Provider (PT): Deveshwar   Encounter Date: 04/09/2020   PT End of Session - 04/09/20 1145    Visit Number 16    Date for PT Re-Evaluation 04/24/20    PT Start Time 1058    PT Stop Time 1142    PT Time Calculation (min) 44 min    Activity Tolerance Patient tolerated treatment well    Behavior During Therapy [facility] for tasks assessed/performed           Past Medical History:  Diagnosis Date  . Callus   . Cataract fragments in both eyes following surgery   . Corns and callosities   . Hx of hysterectomy   . Hypertension     Past Surgical History:  Procedure Laterality Date  . ABDOMINAL HYSTERECTOMY    . NASAL SEPTUM SURGERY    . ROTATOR CUFF REPAIR Right   . TOTAL SHOULDER ARTHROPLASTY      There were no vitals filed for this visit.   Subjective Assessment - 04/09/20 1057    Subjective "Fine"    Currently in Pain? Yes    Pain Score 3     Pain Location Knee    Pain Orientation Right;Left                             [facility] Adult PT Treatment/Exercise - 04/09/20 0001      High Level Balance   High Level Balance Comments Side step over half foam roll; Standing march on airex       Knee/Hip Exercises: Aerobic   Nustep L4 x 7 min    Other Aerobic UBE L2 x 3 min      Knee/Hip Exercises: Machines for Strengthening   Other Machine Rows 5lb Lats 10lb 2x10       Knee/Hip Exercises: Standing   Other Standing Knee Exercises Rows green 2x15; Shoulder Ext red 2x10      Knee/Hip Exercises: Seated   Long Arc Quad Both;2 sets;Strengthening;15 reps    Long Arc Quad Weight 2 lbs.    Marching Both;2 sets;10 reps    Marching Limitations 2    Hamstring Curl Both;2 sets;15 reps    Hamstring  Limitations red      Knee/Hip Exercises: Supine   Other Supine Knee/Hip Exercises Feet on ball K2C, small bridges                    PT Short Term Goals - 03/05/20 1102      PT SHORT TERM GOAL #1   Title independent with initial HEP    Status Achieved             PT Long Term Goals - 03/28/20 1038      PT LONG TERM GOAL #2   Title increase Berg blance score to 49/56    Status Partially Met   47/56                Plan - 04/09/20 1146    Clinical Impression Statement Pt did well overall today. Increase reports tolerated with seated leg curls and extensions. Some fatigue reported with supine interventions. She has some instability with marching on airex. Postural cues needed with seated rows and  standing lats.    Stability/Clinical Decision Making Evolving/Moderate complexity    Rehab Potential Good    PT Frequency 3x / week    PT Duration 8 weeks    PT Treatment/Interventions ADLs/Self Care Home Management;Electrical Stimulation;Moist Heat;Gait training;Neuromuscular re-education;Balance training;Therapeutic exercise;Therapeutic activities;Functional mobility training;Stair training;Patient/family education    PT Next Visit Plan work on movements for some pain relief, needs balance training           Patient will benefit from skilled therapeutic intervention in order to improve the following deficits and impairments:  Abnormal gait, Decreased activity tolerance, Decreased balance, Decreased mobility, Decreased strength, Difficulty walking, Decreased safety awareness, Impaired flexibility  Visit Diagnosis: Other abnormalities of gait and mobility  Acute bilateral low back pain without sciatica  Difficulty in walking, not elsewhere classified     Problem List Patient Active Problem List   Diagnosis Date Noted  . Porokeratosis 11/26/2018  . Corns and callosities 11/26/2018  . History of right shoulder replacement 10/07/2016  . Chronic pain of both  knees 05/20/2016  . Osteoarthritis of both hands 05/20/2016  . Essential hypertension 05/20/2016  . Environmental allergies 05/20/2016  . Primary osteoarthritis of both knees 05/19/2016  . Callus of foot 11/24/2012  . Pain in joint, ankle and foot 09/15/2012    Scot Jun 04/09/2020, 12:00 PM  [place]. [place], [place], [address] Phone: [phone]   Fax:  [phone]  Name: Danielle Dean MRN: [id number] Date of Birth: 05-03-1929

## 2020-04-11 ENCOUNTER — Ambulatory Visit: Payer: Medicare Other | Admitting: Physical Therapy

## 2020-04-11 ENCOUNTER — Encounter: Payer: Self-pay | Admitting: Physical Therapy

## 2020-04-11 ENCOUNTER — Other Ambulatory Visit: Payer: Self-pay

## 2020-04-11 DIAGNOSIS — M545 Low back pain, unspecified: Secondary | ICD-10-CM

## 2020-04-11 DIAGNOSIS — R262 Difficulty in walking, not elsewhere classified: Secondary | ICD-10-CM | POA: Diagnosis not present

## 2020-04-11 DIAGNOSIS — R2689 Other abnormalities of gait and mobility: Secondary | ICD-10-CM

## 2020-04-11 NOTE — Therapy (Signed)
[place]. [place], [place], [address] Phone: [phone]   Fax:  [phone]  Physical Therapy Treatment  Patient Details  Name: Danielle Dean MRN: [id number] Date of Birth: [birth year] Referring Provider (PT): Deveshwar   Encounter Date: 04/11/2020   PT End of Session - 04/11/20 1146    Visit Number 17    Date for PT Re-Evaluation 04/24/20    PT Start Time 1100    PT Stop Time 1144    PT Time Calculation (min) 44 min    Activity Tolerance Patient tolerated treatment well    Behavior During Therapy [facility] for tasks assessed/performed           Past Medical History:  Diagnosis Date  . Callus   . Cataract fragments in both eyes following surgery   . Corns and callosities   . Hx of hysterectomy   . Hypertension     Past Surgical History:  Procedure Laterality Date  . ABDOMINAL HYSTERECTOMY    . NASAL SEPTUM SURGERY    . ROTATOR CUFF REPAIR Right   . TOTAL SHOULDER ARTHROPLASTY      There were no vitals filed for this visit.   Subjective Assessment - 04/11/20 1100    Subjective Feeling okay today. Knees started to bother her last night but feel fine today.                             [facility] Adult PT Treatment/Exercise - 04/11/20 0001      Knee/Hip Exercises: Stretches   Passive Hamstring Stretch 2 reps;30 seconds;Both      Knee/Hip Exercises: Aerobic   Nustep L5 x88mns      Knee/Hip Exercises: Standing   Hip ADduction 2 sets;10 reps   #2   Hip Extension 2 sets;10 reps   2#   Other Standing Knee Exercises Standing Alt. Marches 2x10 2#      Knee/Hip Exercises: Seated   Long Arc Quad Both;2 sets;10 reps    Long Arc Quad Weight 2 lbs.    Clamshell with [employer]   2x10 5" hold   Other Seated Knee/Hip Exercises Lat pull-downs 2x10 #10      Knee/Hip Exercises: Supine   Bridges 2 sets;10 reps;AROM   legs on G ball   Bridges with [employer] sets;10 reps;Both     Other Supine Knee/Hip Exercises Feet on ball K2C 2x10                    PT Short Term Goals - 03/05/20 1102      PT SHORT TERM GOAL #1   Title independent with initial HEP    Status Achieved             PT Long Term Goals - 03/28/20 1038      PT LONG TERM GOAL #2   Title increase Berg blance score to 49/56    Status Partially Met   47/56                Plan - 04/11/20 1146    Clinical Impression Statement No c/o increased pain throughout treatment but reported feeling a little better after PT. Cuing throughout to sit up/stand upright with proper posture when performing exercises. Demonstrates LE fatigue with standing exercises.  Would benefit from continuted PT to address difficulties with walking, decreased strength, and overall decreased activity tolerance.    PT Treatment/Interventions  ADLs/Self Care Home Management;Electrical Stimulation;Moist Heat;Gait training;Neuromuscular re-education;Balance training;Therapeutic exercise;Therapeutic activities;Functional mobility training;Stair training;Patient/family education    PT Next Visit Plan Emphasize balance traning on static and dynamic surfaces.           Patient will benefit from skilled therapeutic intervention in order to improve the following deficits and impairments:  Abnormal gait, Decreased activity tolerance, Decreased balance, Decreased mobility, Decreased strength, Difficulty walking, Decreased safety awareness, Impaired flexibility  Visit Diagnosis: Other abnormalities of gait and mobility  Acute bilateral low back pain without sciatica  Difficulty in walking, not elsewhere classified     Problem List Patient Active Problem List   Diagnosis Date Noted  . Porokeratosis 11/26/2018  . Corns and callosities 11/26/2018  . History of right shoulder replacement 10/07/2016  . Chronic pain of both knees 05/20/2016  . Osteoarthritis of both hands 05/20/2016  . Essential hypertension 05/20/2016   . Environmental allergies 05/20/2016  . Primary osteoarthritis of both knees 05/19/2016  . Callus of foot 11/24/2012  . Pain in joint, ankle and foot 09/15/2012    Lavenia Atlas 04/11/2020, 11:54 AM  [place]. [place], [place], [address] Phone: [phone]   Fax:  [phone]  Name: Danielle Dean MRN: [id number] Date of Birth: 1928/09/11

## 2020-04-17 ENCOUNTER — Other Ambulatory Visit: Payer: Self-pay

## 2020-04-17 ENCOUNTER — Ambulatory Visit: Payer: Medicare Other | Attending: Rheumatology | Admitting: Physical Therapy

## 2020-04-17 ENCOUNTER — Encounter: Payer: Self-pay | Admitting: Physical Therapy

## 2020-04-17 DIAGNOSIS — R2689 Other abnormalities of gait and mobility: Secondary | ICD-10-CM | POA: Diagnosis present

## 2020-04-17 DIAGNOSIS — M545 Low back pain, unspecified: Secondary | ICD-10-CM | POA: Diagnosis present

## 2020-04-17 DIAGNOSIS — R262 Difficulty in walking, not elsewhere classified: Secondary | ICD-10-CM | POA: Insufficient documentation

## 2020-04-17 NOTE — Therapy (Signed)
[facility]. [place], [place], [address] Phone: [phone]   Fax:  [phone]  Physical Therapy Treatment  Patient Details  Name: Danielle Dean MRN: [id number] Date of Birth: June 13, 1929 Referring Provider (PT): Deveshwar   Encounter Date: 04/17/2020   PT End of Session - 04/17/20 1055    Visit Number 18    Date for PT Re-Evaluation 04/24/20    PT Start Time 5329    PT Stop Time 1056    PT Time Calculation (min) 41 min    Activity Tolerance Patient tolerated treatment well    Behavior During Therapy [facility] for tasks assessed/performed           Past Medical History:  Diagnosis Date  . Callus   . Cataract fragments in both eyes following surgery   . Corns and callosities   . Hx of hysterectomy   . Hypertension     Past Surgical History:  Procedure Laterality Date  . ABDOMINAL HYSTERECTOMY    . NASAL SEPTUM SURGERY    . ROTATOR CUFF REPAIR Right   . TOTAL SHOULDER ARTHROPLASTY      There were no vitals filed for this visit.   Subjective Assessment - 04/17/20 1016    Subjective "I am ok, always pain"    Currently in Pain? Yes    Pain Score 3     Pain Location Knee                             [facility] Adult PT Treatment/Exercise - 04/17/20 0001      High Level Balance   High Level Balance Comments side step on soft balance beam      Knee/Hip Exercises: Aerobic   Nustep L4 x7 mins      Knee/Hip Exercises: Standing   Other Standing Knee Exercises Standing Alt 6 in taps      Knee/Hip Exercises: Seated   Long Arc Quad Both;2 sets;10 reps    Long Arc Quad Weight 2 lbs.    Other Seated Knee/Hip Exercises Lat pull-downs 2x10 #10    Other Seated Knee/Hip Exercises Rows 5lb 2x10     Hamstring Curl Both;2 sets;15 reps    Hamstring Limitations red      Knee/Hip Exercises: Supine   Other Supine Knee/Hip Exercises Feet on ball K2C, bridges, oblq                    PT  Short Term Goals - 03/05/20 1102      PT SHORT TERM GOAL #1   Title independent with initial HEP    Status Achieved             PT Long Term Goals - 04/17/20 1055      PT LONG TERM GOAL #1   Title decrease TUG time to 15 seconds    Status Achieved      PT LONG TERM GOAL #2   Title increase Berg blance score to 49/56    Status Partially Met      PT LONG TERM GOAL #3   Title report pain 50% less with cooking    Status Achieved                 Plan - 04/17/20 1056    Clinical Impression Statement No reports of increase pain throughout session. She does report some LE fatigue with LAQ and HS curls. She has  some difficulty today with side steps on soft balance beam. CGA needed with alt box taps.    Stability/Clinical Decision Making Evolving/Moderate complexity    Rehab Potential Good    PT Frequency 3x / week    PT Duration 8 weeks    PT Treatment/Interventions ADLs/Self Care Home Management;Electrical Stimulation;Moist Heat;Gait training;Neuromuscular re-education;Balance training;Therapeutic exercise;Therapeutic activities;Functional mobility training;Stair training;Patient/family education    PT Next Visit Plan Emphasize balance training on static and dynamic surfaces.           Patient will benefit from skilled therapeutic intervention in order to improve the following deficits and impairments:  Abnormal gait, Decreased activity tolerance, Decreased balance, Decreased mobility, Decreased strength, Difficulty walking, Decreased safety awareness, Impaired flexibility  Visit Diagnosis: Other abnormalities of gait and mobility  Acute bilateral low back pain without sciatica  Difficulty in walking, not elsewhere classified     Problem List Patient Active Problem List   Diagnosis Date Noted  . Porokeratosis 11/26/2018  . Corns and callosities 11/26/2018  . History of right shoulder replacement 10/07/2016  . Chronic pain of both knees 05/20/2016  .  Osteoarthritis of both hands 05/20/2016  . Essential hypertension 05/20/2016  . Environmental allergies 05/20/2016  . Primary osteoarthritis of both knees 05/19/2016  . Callus of foot 11/24/2012  . Pain in joint, ankle and foot 09/15/2012    Scot Jun, PTA 04/17/2020, 11:00 AM  [facility]. [place], [place], [address] Phone: [phone]   Fax:  [phone]  Name: DETRIA CUMMINGS MRN: [id number] Date of Birth: Dec 09, 1928

## 2020-04-19 ENCOUNTER — Other Ambulatory Visit: Payer: Self-pay

## 2020-04-19 ENCOUNTER — Ambulatory Visit: Payer: Medicare Other | Admitting: Physical Therapy

## 2020-04-19 ENCOUNTER — Encounter: Payer: Self-pay | Admitting: Physical Therapy

## 2020-04-19 DIAGNOSIS — R2689 Other abnormalities of gait and mobility: Secondary | ICD-10-CM

## 2020-04-19 DIAGNOSIS — R262 Difficulty in walking, not elsewhere classified: Secondary | ICD-10-CM

## 2020-04-19 DIAGNOSIS — M545 Low back pain, unspecified: Secondary | ICD-10-CM

## 2020-04-19 NOTE — Therapy (Signed)
[facility] [address]. [place], [name], [address] Phone: [phone]   Fax:  [phone]  Physical Therapy Treatment  Patient Details  Name: Danielle Dean MRN: [id number] Date of Birth: April 21, 1929 Referring Provider (PT): Deveshwar   Encounter Date: 04/19/2020   PT End of Session - 04/19/20 1138    Visit Number 19    Date for PT Re-Evaluation 04/24/20    PT Start Time 1057    PT Stop Time 1139    PT Time Calculation (min) 42 min    Activity Tolerance Patient tolerated treatment well    Behavior During Therapy [facility] for tasks assessed/performed           Past Medical History:  Diagnosis Date  . Callus   . Cataract fragments in both eyes following surgery   . Corns and callosities   . Hx of hysterectomy   . Hypertension     Past Surgical History:  Procedure Laterality Date  . ABDOMINAL HYSTERECTOMY    . NASAL SEPTUM SURGERY    . ROTATOR CUFF REPAIR Right   . TOTAL SHOULDER ARTHROPLASTY      There were no vitals filed for this visit.   Subjective Assessment - 04/19/20 1057    Subjective "Im ok"    Pain Score 3     Pain Location Knee    Pain Orientation Left;Right              [facility] PT Assessment - 04/19/20 0001      Berg Balance Test   Sit to Stand Able to stand without using hands and stabilize independently    Standing Unsupported Able to stand safely 2 minutes    Sitting with Back Unsupported but Feet Supported on Floor or Stool Able to sit safely and securely 2 minutes    Stand to Sit Sits safely with minimal use of hands    Transfers Able to transfer safely, minor use of hands    Standing Unsupported with Eyes Closed Able to stand 10 seconds safely    Standing Unsupported with Feet Together Able to place feet together independently and stand 1 minute safely    From Standing, Reach Forward with Outstretched Arm Can reach confidently >25 cm (10")    From Standing Position, Pick up Object from  Floor Able to pick up shoe safely and easily    From Standing Position, Turn to Look Behind Over each Shoulder Looks behind one side only/other side shows less weight shift    Turn 360 Degrees Able to turn 360 degrees safely in 4 seconds or less    Standing Unsupported, Alternately Place Feet on Step/Stool Able to stand independently and safely and complete 8 steps in 20 seconds    Standing Unsupported, One Foot in Front Able to plae foot ahead of the other independently and hold 30 seconds    Standing on One Leg Unable to try or needs assist to prevent fall    Total Score 50                         [facility] Adult PT Treatment/Exercise - 04/19/20 0001      High Level Balance   High Level Balance Comments side step & tandem walking on soft balance beam in II bars       Knee/Hip Exercises: Aerobic   Nustep L4 x7 mins      Knee/Hip Exercises: Standing   Other Standing Knee  Exercises standing marches 2x10     Other Standing Knee Exercises Shoulder Ext red 2x10       Knee/Hip Exercises: Seated   Other Seated Knee/Hip Exercises Lat pull-downs 2x15 #10    Other Seated Knee/Hip Exercises Rows 5lb 2x10, Tband rows green 2x15    Hamstring Curl Both;2 sets;10 reps    Hamstring Limitations red    Sit to Sand 3 sets;5 reps      Knee/Hip Exercises: Supine   Bridges with Ball Squeeze 2 sets;10 reps;Both    Other Supine Knee/Hip Exercises Feet on ball K2C, oblq    Other Supine Knee/Hip Exercises ab sets 2x10; Hooklying marches 2x10                     PT Short Term Goals - 03/05/20 1102      PT SHORT TERM GOAL #1   Title independent with initial HEP    Status Achieved             PT Long Term Goals - 04/19/20 1110      PT LONG TERM GOAL #2   Title increase Berg blance score to 49/56    Status Achieved      PT LONG TERM GOAL #3   Title report pain 50% less with cooking    Status Achieved                 Plan - 04/19/20 1140    Clinical Impression  Statement Pt has progressed increasing her BERG balance score meeting goal. She does report a increase in R knee and L shoulder pain that she stated is normal with activity. Some UE assist needed with soft balance beam activities. Cues for core engagement with supine exercises.    Stability/Clinical Decision Making Evolving/Moderate complexity    Rehab Potential Good    PT Frequency 3x / week    PT Duration 8 weeks    PT Treatment/Interventions ADLs/Self Care Home Management;Electrical Stimulation;Moist Heat;Gait training;Neuromuscular re-education;Balance training;Therapeutic exercise;Therapeutic activities;Functional mobility training;Stair training;Patient/family education    PT Next Visit Plan Emphasize balance traning on static and dynamic surfaces.           Patient will benefit from skilled therapeutic intervention in order to improve the following deficits and impairments:  Abnormal gait, Decreased activity tolerance, Decreased balance, Decreased mobility, Decreased strength, Difficulty walking, Decreased safety awareness, Impaired flexibility  Visit Diagnosis: Difficulty in walking, not elsewhere classified  Acute bilateral low back pain without sciatica  Other abnormalities of gait and mobility     Problem List Patient Active Problem List   Diagnosis Date Noted  . Porokeratosis 11/26/2018  . Corns and callosities 11/26/2018  . History of right shoulder replacement 10/07/2016  . Chronic pain of both knees 05/20/2016  . Osteoarthritis of both hands 05/20/2016  . Essential hypertension 05/20/2016  . Environmental allergies 05/20/2016  . Primary osteoarthritis of both knees 05/19/2016  . Callus of foot 11/24/2012  . Pain in joint, ankle and foot 09/15/2012    Grayce Sessions 04/19/2020, 11:41 AM  [facility] [address]. [place], [name], [address] Phone: [phone]   Fax:  [phone]  Name: Danielle Dean MRN: [id number] Date of Birth: 1929-04-01

## 2020-04-24 ENCOUNTER — Other Ambulatory Visit: Payer: Self-pay

## 2020-04-24 ENCOUNTER — Encounter: Payer: Self-pay | Admitting: Physical Therapy

## 2020-04-24 ENCOUNTER — Ambulatory Visit: Payer: Medicare Other | Admitting: Physical Therapy

## 2020-04-24 DIAGNOSIS — R262 Difficulty in walking, not elsewhere classified: Secondary | ICD-10-CM

## 2020-04-24 DIAGNOSIS — R2689 Other abnormalities of gait and mobility: Secondary | ICD-10-CM

## 2020-04-24 DIAGNOSIS — M545 Low back pain, unspecified: Secondary | ICD-10-CM

## 2020-04-24 NOTE — Therapy (Signed)
[facility]. [place], [place], [address] Phone: [phone]   Fax:  [phone] Progress Note Reporting Period 03/21/20 to 04/24/20 for visits 11-20  See note below for Objective Data and Assessment of Progress/Goals.      Physical Therapy Treatment  Patient Details  Name: Danielle Dean MRN: [id number] Date of Birth: [birth year] Referring Provider (PT): Deveshwar   Encounter Date: 04/24/2020   PT End of Session - 04/24/20 1140    Visit Number 20    Date for PT Re-Evaluation 04/24/20    PT Start Time 1100    PT Stop Time 1143    PT Time Calculation (min) 43 min    Activity Tolerance Patient tolerated treatment well    Behavior During Therapy [facility] for tasks assessed/performed           Past Medical History:  Diagnosis Date  . Callus   . Cataract fragments in both eyes following surgery   . Corns and callosities   . Hx of hysterectomy   . Hypertension     Past Surgical History:  Procedure Laterality Date  . ABDOMINAL HYSTERECTOMY    . NASAL SEPTUM SURGERY    . ROTATOR CUFF REPAIR Right   . TOTAL SHOULDER ARTHROPLASTY      There were no vitals filed for this visit.   Subjective Assessment - 04/24/20 1101    Subjective Pt report she same pain as always in knees and shoulder. She stated L shoulder pain ins due to years of compensation.    Currently in Pain? Yes    Pain Score 4     Pain Location Knee    Pain Orientation Left;Right                             [facility] Adult PT Treatment/Exercise - 04/24/20 0001      Knee/Hip Exercises: Aerobic   Nustep L3 x7 mins      Knee/Hip Exercises: Machines for Strengthening   Cybex Knee Flexion 10lb 2x10      Knee/Hip Exercises: Standing   Other Standing Knee Exercises standing marches 2x10     Other Standing Knee Exercises Shoulder Ext red 2x10       Knee/Hip Exercises: Seated   Long Arc Quad Both;2 sets;10 reps    Other Seated  Knee/Hip Exercises Lat pull-downs 2x15 #10    Other Seated Knee/Hip Exercises red tband row 2x15    Marching Both;2 sets;10 reps    Marching Limitations 2    Hamstring Curl Both;2 sets;15 reps;10 reps    Hamstring Limitations red      Knee/Hip Exercises: Supine   Other Supine Knee/Hip Exercises Feet on ball K2C, oblq    Other Supine Knee/Hip Exercises ab sets 2x10; Hooklying marches 2x10                     PT Short Term Goals - 03/05/20 1102      PT SHORT TERM GOAL #1   Title independent with initial HEP    Status Achieved             PT Long Term Goals - 04/24/20 1142      PT LONG TERM GOAL #1   Title decrease TUG time to 15 seconds    Status Achieved      PT LONG TERM GOAL #2   Title increase Berg blance score to 49/56  Status Achieved      PT LONG TERM GOAL #3   Title report pain 50% less with cooking    Status Achieved      PT LONG TERM GOAL #4   Title tolerate walking 15 minutes without pain >5/10    Status On-going                 Plan - 04/24/20 1140    Clinical Impression Statement Most goals met. Pt did we today, some interventions limited due to  pt not wanting to cause any increase in pain. Tactile cues for core engagement needed with supine interventions. Postural cues provided with standing extensions.    Stability/Clinical Decision Making Evolving/Moderate complexity    Rehab Potential Good    PT Frequency 3x / week    PT Treatment/Interventions ADLs/Self Care Home Management;Electrical Stimulation;Moist Heat;Gait training;Neuromuscular re-education;Balance training;Therapeutic exercise;Therapeutic activities;Functional mobility training;Stair training;Patient/family education    PT Next Visit Plan D/C PT           Patient will benefit from skilled therapeutic intervention in order to improve the following deficits and impairments:  Abnormal gait, Decreased activity tolerance, Decreased balance, Decreased mobility, Decreased  strength, Difficulty walking, Decreased safety awareness, Impaired flexibility  Visit Diagnosis: Other abnormalities of gait and mobility  Acute bilateral low back pain without sciatica  Difficulty in walking, not elsewhere classified     Problem List Patient Active Problem List   Diagnosis Date Noted  . Porokeratosis 11/26/2018  . Corns and callosities 11/26/2018  . History of right shoulder replacement 10/07/2016  . Chronic pain of both knees 05/20/2016  . Osteoarthritis of both hands 05/20/2016  . Essential hypertension 05/20/2016  . Environmental allergies 05/20/2016  . Primary osteoarthritis of both knees 05/19/2016  . Callus of foot 11/24/2012  . Pain in joint, ankle and foot 09/15/2012   PHYSICAL THERAPY DISCHARGE SUMMARY  Visits from Start of Care: 20   Plan: Patient agrees to discharge.  Patient goals were partially met. Patient is being discharged due to being pleased with the current functional level.  ?????     Scot Jun, PTA 04/24/2020, 11:44 AM  [facility]. [place], [place], [address] Phone: [phone]   Fax:  [phone]  Name: Danielle Dean MRN: [id number] Date of Birth: Jul 14, 1928

## 2020-04-25 ENCOUNTER — Telehealth: Payer: Self-pay | Admitting: Rheumatology

## 2020-04-25 DIAGNOSIS — M545 Low back pain, unspecified: Secondary | ICD-10-CM

## 2020-04-25 DIAGNOSIS — M170 Bilateral primary osteoarthritis of knee: Secondary | ICD-10-CM

## 2020-04-25 DIAGNOSIS — M5134 Other intervertebral disc degeneration, thoracic region: Secondary | ICD-10-CM

## 2020-04-25 NOTE — Telephone Encounter (Signed)
Ok to place new referral for PT for pain in both knees due to osteoarthritis and low back pain.

## 2020-04-25 NOTE — Telephone Encounter (Signed)
Patient's advised referral has been placed.

## 2020-04-25 NOTE — Telephone Encounter (Signed)
Daughter calling to request rx for patient to have more PT. Patient is benefiting from those sessions, and this week is her last week per rx. Please call to advise. (daughter or brother)

## 2020-05-04 ENCOUNTER — Encounter: Payer: Self-pay | Admitting: Rheumatology

## 2020-05-09 ENCOUNTER — Telehealth: Payer: Self-pay

## 2020-05-09 NOTE — Telephone Encounter (Signed)
Patient's son Viviano Simas (who is on [place]) returned the call and he declined an appointment for the patient at this time. He requested a copy of the results so he could provide them to patient's daughter who is a rheumatologist in [place]. A copy of the results is at the front desk ready to be picked up.

## 2020-05-09 NOTE — Telephone Encounter (Signed)
DEXA received via fax from [facility].   Date of scan: 05/04/2020  T-score: -4.6 BMD: 0.421 Lowest site measured: 1/3 left distal radius   Patient states she has previously taken bisphosphonate's. Unable to tolerate and stopped them.  DEXA reviewed by Sherron Ales, PA-C and noted to please schedule follow up visit to discuss other options.   I attempted to contact patient and left message on machine to advise patient to call the office to schedule an appointment to discuss bone density results.

## 2020-05-16 ENCOUNTER — Telehealth: Payer: Self-pay

## 2020-05-24 NOTE — Telephone Encounter (Signed)
Patient's son Viviano Simas called stating he had additional questions regarding his mom's medication.  Call was transferred to [facility].

## 2020-05-25 NOTE — Progress Notes (Signed)
Office Visit Note  Patient: Danielle Dean             Date of Birth: January 02, 1929           MRN: [id number]             PCP: Georgann Housekeeper, MD Referring: Georgann Housekeeper, MD Visit Date: 06/07/2020 Occupation: @GUAROCC @  Subjective:  Other (Bilateral knee pain)   History of Present Illness: Danielle Dean is a 86 y.o. female with history of osteoarthritis, degenerative disc disease and osteoporosis.  She states she continues to have pain and discomfort in her bilateral knee joints.  She would like to have cortisone injections to her bilateral knees.  She has some arthritis in her hands which continues to bother her.  She wants to discuss bone density results and treatment plans.  Activities of Daily Living:  Patient reports morning stiffness for 0 minutes.   Patient Reports nocturnal pain.  Difficulty dressing/grooming: Denies Difficulty climbing stairs: Reports Difficulty getting out of chair: Reports Difficulty using hands for taps, buttons, cutlery, and/or writing: Reports  Review of Systems  Constitutional: Positive for fatigue.  HENT: Negative for mouth sores, mouth dryness and nose dryness.   Eyes: Positive for dryness. Negative for pain and itching.  Respiratory: Negative for shortness of breath and difficulty breathing.   Cardiovascular: Negative for chest pain and palpitations.  Gastrointestinal: Negative for blood in stool, constipation and diarrhea.  Endocrine: Negative for increased urination.  Genitourinary: Negative for difficulty urinating.  Musculoskeletal: Positive for arthralgias, joint pain, myalgias, muscle tenderness and myalgias. Negative for joint swelling and morning stiffness.  Skin: Negative for color change, rash and redness.  Allergic/Immunologic: Negative for susceptible to infections.  Neurological: Positive for numbness. Negative for dizziness, headaches, memory loss and weakness.  Hematological: Positive for bruising/bleeding tendency.   Psychiatric/Behavioral: Negative for confusion.    PMFS History:  Patient Active Problem List   Diagnosis Date Noted  . Porokeratosis 11/26/2018  . Corns and callosities 11/26/2018  . History of right shoulder replacement 10/07/2016  . Chronic pain of both knees 05/20/2016  . Osteoarthritis of both hands 05/20/2016  . Essential hypertension 05/20/2016  . Environmental allergies 05/20/2016  . Primary osteoarthritis of both knees 05/19/2016  . Callus of foot 11/24/2012  . Pain in joint, ankle and foot 09/15/2012    Past Medical History:  Diagnosis Date  . Callus   . Cataract fragments in both eyes following surgery   . Corns and callosities   . Hx of hysterectomy   . Hypertension     Family History  Problem Relation Age of Onset  . Arthritis Mother   . Diabetes Father    Past Surgical History:  Procedure Laterality Date  . ABDOMINAL HYSTERECTOMY    . NASAL SEPTUM SURGERY    . ROTATOR CUFF REPAIR Right   . TOTAL SHOULDER ARTHROPLASTY     Social History   Social History Narrative  . Not on file   Immunization History  Administered Date(s) Administered  . PFIZER SARS-COV-2 Vaccination 07/06/2019, 07/27/2019, 03/10/2020     Objective: Vital Signs: BP (!) 128/56 (BP Location: Left Arm, Patient Position: Sitting, Cuff Size: Normal)   Pulse 60   Resp 13   Ht 4' 7.25" (1.403 m)   Wt 104 lb 9.6 oz (47.4 kg)   BMI 24.09 kg/m    Physical Exam Vitals and nursing note reviewed.  Constitutional:      Appearance: She is well-developed and well-nourished.  HENT:  Head: Normocephalic and atraumatic.  Eyes:     Extraocular Movements: EOM normal.     Conjunctiva/sclera: Conjunctivae normal.  Cardiovascular:     Rate and Rhythm: Normal rate and regular rhythm.     Pulses: Intact distal pulses.     Heart sounds: Normal heart sounds.  Pulmonary:     Effort: Pulmonary effort is normal.     Breath sounds: Normal breath sounds.  Abdominal:     General: Bowel sounds  are normal.     Palpations: Abdomen is soft.  Musculoskeletal:     Cervical back: Normal range of motion.  Lymphadenopathy:     Cervical: No cervical adenopathy.  Skin:    General: Skin is warm and dry.     Capillary Refill: Capillary refill takes less than 2 seconds.  Neurological:     Mental Status: She is alert and oriented to person, place, and time.  Psychiatric:        Mood and Affect: Mood and affect normal.        Behavior: Behavior normal.      Musculoskeletal Exam: C-spine was in good range of motion.  She has thoracic kyphosis.  Shoulder joints are in good range of motion.  Right shoulder joint is replaced.  Elbow joints and wrist joints with good range of motion.  She has bilateral PIP and DIP prominence and subluxation.  Hip joints and knee joints with good range of motion.  She had discomfort range of motion of bilateral knee joints without any warmth.  There was no tenderness over ankles or MTPs.  CDAI Exam: CDAI Score: -- Patient Global: --; Provider Global: -- Swollen: --; Tender: -- Joint Exam 06/07/2020   No joint exam has been documented for this visit   There is currently no information documented on the homunculus. Go to the Rheumatology activity and complete the homunculus joint exam.  Investigation: No additional findings.  Imaging: No results found.  Recent Labs: No results found for: WBC, HGB, PLT, NA, K, CL, CO2, GLUCOSE, BUN, CREATININE, BILITOT, ALKPHOS, AST, ALT, PROT, ALBUMIN, CALCIUM, GFRAA, QFTBGOLD, QFTBGOLDPLUS  Speciality Comments: No specialty comments available.  Procedures:  Large Joint Inj: bilateral knee on 06/07/2020 3:18 PM Indications: pain Details: 27 G 1.5 in needle, medial approach  Arthrogram: No  Medications (Right): 1.5 mL lidocaine 1 %; 40 mg triamcinolone acetonide 40 MG/ML Aspirate (Right): 0 mL Medications (Left): 1.5 mL lidocaine 1 %; 40 mg triamcinolone acetonide 40 MG/ML Aspirate (Left): 0 mL Outcome:  tolerated well, no immediate complications Procedure, treatment alternatives, risks and benefits explained, specific risks discussed. Consent was given by the patient. Immediately prior to procedure a time out was called to verify the correct patient, procedure, equipment, support staff and site/side marked as required. Patient was prepped and draped in the usual sterile fashion.     Allergies: Actonel [risedronate], Fosamax [alendronate], and Lisinopril   Assessment / Plan:     Visit Diagnoses: Primary osteoarthritis of both hands-she has severe osteoarthritis in her hands.  She has subluxation of PIPs and DIPs.  Joint protection was discussed.  Chronic pain of both knees-she has severe osteoarthritis in her knee joints.  She has been having increased pain in her both knees.  Per her request after informed consent was obtained bilateral knee joints were injected with cortisone as described above.  She tolerated the procedure well.  Postprocedure instructions were given.  Primary osteoarthritis of both knees-chronic pain  Chronic left shoulder pain-she has some discomfort in her  shoulder.  History of right shoulder replacement-she had good range of motion of her right shoulder.  DDD (degenerative disc disease), thoracic-she has some discomfort in the thoracic region.  DDD (degenerative disc disease), lumbar-she has some chronic lower back pain.  Other idiopathic scoliosis, thoracolumbar region  Age-related osteoporosis without current pathological fracture - DEXA: 1/3 Left distal radius BMD 0.421 with T-score -4.6. Cannot tolerate bisphophonates she has reflux and intolerance to bisphosphonates in the past.  Different treatment options and their side effects were discussed.  She inclined towards Prolia.  She is not interested in daily injections.  We will apply for Prolia.  She will need following labs prior to injection.- Plan: Parathyroid hormone, intact (no Ca), VITAMIN D 25 Hydroxy (Vit-D  Deficiency, Fractures), TSH, Serum protein electrophoresis with reflex  Medication monitoring encounter -we will need these labs prior to injection.  Plan: CBC with Differential/Platelet, COMPLETE METABOLIC PANEL WITH GFR  Other fatigue - Plan: TSH  History of hypertension-her blood pressure is well controlled.  Orders: Orders Placed This Encounter  Procedures  . CBC with Differential/Platelet  . COMPLETE METABOLIC PANEL WITH GFR  . Parathyroid hormone, intact (no Ca)  . VITAMIN D 25 Hydroxy (Vit-D Deficiency, Fractures)  . TSH  . Serum protein electrophoresis with reflex   No orders of the defined types were placed in this encounter.     Follow-Up Instructions: Return in about 3 months (around 09/05/2020) for Osteoarthritis.   Pollyann Savoy, MD  Note - This record has been created using Animal nutritionist.  Chart creation errors have been sought, but may not always  have been located. Such creation errors do not reflect on  the standard of medical care.

## 2020-06-07 ENCOUNTER — Encounter: Payer: Self-pay | Admitting: Rheumatology

## 2020-06-07 ENCOUNTER — Other Ambulatory Visit: Payer: Self-pay

## 2020-06-07 ENCOUNTER — Ambulatory Visit (INDEPENDENT_AMBULATORY_CARE_PROVIDER_SITE_OTHER): Payer: Medicare Other | Admitting: Rheumatology

## 2020-06-07 ENCOUNTER — Telehealth: Payer: Self-pay

## 2020-06-07 VITALS — BP 128/56 | HR 60 | Resp 13 | Ht <= 58 in | Wt 104.6 lb

## 2020-06-07 DIAGNOSIS — M19042 Primary osteoarthritis, left hand: Secondary | ICD-10-CM

## 2020-06-07 DIAGNOSIS — M4125 Other idiopathic scoliosis, thoracolumbar region: Secondary | ICD-10-CM

## 2020-06-07 DIAGNOSIS — G8929 Other chronic pain: Secondary | ICD-10-CM | POA: Diagnosis not present

## 2020-06-07 DIAGNOSIS — Z5181 Encounter for therapeutic drug level monitoring: Secondary | ICD-10-CM

## 2020-06-07 DIAGNOSIS — M5134 Other intervertebral disc degeneration, thoracic region: Secondary | ICD-10-CM

## 2020-06-07 DIAGNOSIS — R5383 Other fatigue: Secondary | ICD-10-CM

## 2020-06-07 DIAGNOSIS — M5136 Other intervertebral disc degeneration, lumbar region: Secondary | ICD-10-CM

## 2020-06-07 DIAGNOSIS — M25561 Pain in right knee: Secondary | ICD-10-CM

## 2020-06-07 DIAGNOSIS — M170 Bilateral primary osteoarthritis of knee: Secondary | ICD-10-CM | POA: Diagnosis not present

## 2020-06-07 DIAGNOSIS — M25512 Pain in left shoulder: Secondary | ICD-10-CM | POA: Diagnosis not present

## 2020-06-07 DIAGNOSIS — M19041 Primary osteoarthritis, right hand: Secondary | ICD-10-CM | POA: Diagnosis not present

## 2020-06-07 DIAGNOSIS — M25562 Pain in left knee: Secondary | ICD-10-CM | POA: Diagnosis not present

## 2020-06-07 DIAGNOSIS — M810 Age-related osteoporosis without current pathological fracture: Secondary | ICD-10-CM

## 2020-06-07 DIAGNOSIS — Z8679 Personal history of other diseases of the circulatory system: Secondary | ICD-10-CM

## 2020-06-07 DIAGNOSIS — Z96611 Presence of right artificial shoulder joint: Secondary | ICD-10-CM

## 2020-06-07 MED ORDER — LIDOCAINE HCL 1 % IJ SOLN
1.5000 mL | INTRAMUSCULAR | Status: AC | PRN
  Administered 2020-06-07: 1.5 mL

## 2020-06-07 MED ORDER — TRIAMCINOLONE ACETONIDE 40 MG/ML IJ SUSP
40.0000 mg | INTRAMUSCULAR | Status: AC | PRN
  Administered 2020-06-07: 40 mg via INTRA_ARTICULAR

## 2020-06-07 NOTE — Telephone Encounter (Signed)
Please apply for prolia, per Dr. Deveshwar. Thanks!  

## 2020-06-07 NOTE — Patient Instructions (Signed)
Denosumab injection °What is this medicine? °DENOSUMAB (den oh sue mab) slows bone breakdown. Prolia is used to treat osteoporosis in women after menopause and in men, and in people who are taking corticosteroids for 6 months or more. Xgeva is used to treat a high calcium level due to cancer and to prevent bone fractures and other bone problems caused by multiple myeloma or cancer bone metastases. Xgeva is also used to treat giant cell tumor of the bone. °This medicine may be used for other purposes; ask your health care provider or pharmacist if you have questions. °COMMON BRAND NAME(S): Prolia, XGEVA °What should I tell my health care provider before I take this medicine? °They need to know if you have any of these conditions: °· dental disease °· having surgery or tooth extraction °· infection °· kidney disease °· low levels of calcium or Vitamin D in the blood °· malnutrition °· on hemodialysis °· skin conditions or sensitivity °· thyroid or parathyroid disease °· an unusual reaction to denosumab, other medicines, foods, dyes, or preservatives °· pregnant or trying to get pregnant °· breast-feeding °How should I use this medicine? °This medicine is for injection under the skin. It is given by a health care professional in a hospital or clinic setting. °A special MedGuide will be given to you before each treatment. Be sure to read this information carefully each time. °For Prolia, talk to your pediatrician regarding the use of this medicine in children. Special care may be needed. For Xgeva, talk to your pediatrician regarding the use of this medicine in children. While this drug may be prescribed for children as young as 13 years for selected conditions, precautions do apply. °Overdosage: If you think you have taken too much of this medicine contact a poison control center or emergency room at once. °NOTE: This medicine is only for you. Do not share this medicine with others. °What if I miss a dose? °It is  important not to miss your dose. Call your doctor or health care professional if you are unable to keep an appointment. °What may interact with this medicine? °Do not take this medicine with any of the following medications: °· other medicines containing denosumab °This medicine may also interact with the following medications: °· medicines that lower your chance of fighting infection °· steroid medicines like prednisone or cortisone °This list may not describe all possible interactions. Give your health care provider a list of all the medicines, herbs, non-prescription drugs, or dietary supplements you use. Also tell them if you smoke, drink alcohol, or use illegal drugs. Some items may interact with your medicine. °What should I watch for while using this medicine? °Visit your doctor or health care professional for regular checks on your progress. Your doctor or health care professional may order blood tests and other tests to see how you are doing. °Call your doctor or health care professional for advice if you get a fever, chills or sore throat, or other symptoms of a cold or flu. Do not treat yourself. This drug may decrease your body's ability to fight infection. Try to avoid being around people who are sick. °You should make sure you get enough calcium and vitamin D while you are taking this medicine, unless your doctor tells you not to. Discuss the foods you eat and the vitamins you take with your health care professional. °See your dentist regularly. Brush and floss your teeth as directed. Before you have any dental work done, tell your dentist you are   receiving this medicine. Do not become pregnant while taking this medicine or for 5 months after stopping it. Talk with your doctor or health care professional about your birth control options while taking this medicine. Women should inform their doctor if they wish to become pregnant or think they might be pregnant. There is a potential for serious side  effects to an unborn child. Talk to your health care professional or pharmacist for more information. What side effects may I notice from receiving this medicine? Side effects that you should report to your doctor or health care professional as soon as possible:  allergic reactions like skin rash, itching or hives, swelling of the face, lips, or tongue  bone pain  breathing problems  dizziness  jaw pain, especially after dental work  redness, blistering, peeling of the skin  signs and symptoms of infection like fever or chills; cough; sore throat; pain or trouble passing urine  signs of low calcium like fast heartbeat, muscle cramps or muscle pain; pain, tingling, numbness in the hands or feet; seizures  unusual bleeding or bruising  unusually weak or tired Side effects that usually do not require medical attention (report to your doctor or health care professional if they continue or are bothersome):  constipation  diarrhea  headache  joint pain  loss of appetite  muscle pain  runny nose  tiredness  upset stomach This list may not describe all possible side effects. Call your doctor for medical advice about side effects. You may report side effects to FDA at 1-800-FDA-1088. Where should I keep my medicine? This medicine is only given in a clinic, doctor's office, or other health care setting and will not be stored at home. NOTE: This sheet is a summary. It may not cover all possible information. If you have questions about this medicine, talk to your doctor, pharmacist, or health care provider.  2020 Elsevier/Gold Standard (2017-10-09 16:10:44)

## 2020-06-11 NOTE — Telephone Encounter (Signed)
PA not required for Prolia. Patient's copay is $4.00. Patient can fill through [facility].

## 2020-06-13 NOTE — Telephone Encounter (Signed)
Patient advised Prolia is approved. Patient advised she will need labs prior to sending the prescription to the pharmacy. Patient states she will come to have them performed.

## 2020-06-14 ENCOUNTER — Other Ambulatory Visit: Payer: Self-pay | Admitting: *Deleted

## 2020-06-14 DIAGNOSIS — M810 Age-related osteoporosis without current pathological fracture: Secondary | ICD-10-CM

## 2020-06-14 DIAGNOSIS — Z5181 Encounter for therapeutic drug level monitoring: Secondary | ICD-10-CM

## 2020-06-14 DIAGNOSIS — R5383 Other fatigue: Secondary | ICD-10-CM

## 2020-06-14 NOTE — Telephone Encounter (Signed)
Patient cam into the office on 06/14/2020 for labs.

## 2020-06-18 ENCOUNTER — Ambulatory Visit: Payer: Medicare Other | Admitting: Physical Therapy

## 2020-06-18 ENCOUNTER — Other Ambulatory Visit: Payer: Self-pay | Admitting: Rheumatology

## 2020-06-18 LAB — COMPLETE METABOLIC PANEL WITH GFR
AG Ratio: 1.9 (calc) (ref 1.0–2.5)
ALT: 12 U/L (ref 6–29)
AST: 16 U/L (ref 10–35)
Albumin: 3.9 g/dL (ref 3.6–5.1)
Alkaline phosphatase (APISO): 87 U/L (ref 37–153)
BUN: 17 mg/dL (ref 7–25)
CO2: 26 mmol/L (ref 20–32)
Calcium: 8.9 mg/dL (ref 8.6–10.4)
Chloride: 99 mmol/L (ref 98–110)
Creat: 0.76 mg/dL (ref 0.60–0.88)
GFR, Est African American: 81 mL/min/{1.73_m2} (ref >=60)
GFR, Est Non African American: 70 mL/min/{1.73_m2} (ref >=60)
Globulin: 2.1 g/dL (calc) (ref 1.9–3.7)
Glucose, Bld: 107 mg/dL — ABNORMAL HIGH (ref 65–99)
Potassium: 4.8 mmol/L (ref 3.5–5.3)
Sodium: 130 mmol/L — ABNORMAL LOW (ref 135–146)
Total Bilirubin: 0.4 mg/dL (ref 0.2–1.2)
Total Protein: 6 g/dL — ABNORMAL LOW (ref 6.1–8.1)

## 2020-06-18 LAB — CBC WITH DIFFERENTIAL/PLATELET
Absolute Monocytes: 861 cells/uL (ref 200–950)
Basophils Absolute: 28 cells/uL (ref 0–200)
Basophils Relative: 0.4 %
Eosinophils Absolute: 28 cells/uL (ref 15–500)
Eosinophils Relative: 0.4 %
HCT: 32.6 % — ABNORMAL LOW (ref 35.0–45.0)
Hemoglobin: 11.1 g/dL — ABNORMAL LOW (ref 11.7–15.5)
Lymphs Abs: 1491 cells/uL (ref 850–3900)
MCH: 31.4 pg (ref 27.0–33.0)
MCHC: 34 g/dL (ref 32.0–36.0)
MCV: 92.1 fL (ref 80.0–100.0)
MPV: 9.8 fL (ref 7.5–12.5)
Monocytes Relative: 12.3 %
Neutro Abs: 4592 cells/uL (ref 1500–7800)
Neutrophils Relative %: 65.6 %
Platelets: 239 10*3/uL (ref 140–400)
RBC: 3.54 10*6/uL — ABNORMAL LOW (ref 3.80–5.10)
RDW: 11.7 % (ref 11.0–15.0)
Total Lymphocyte: 21.3 %
WBC: 7 10*3/uL (ref 3.8–10.8)

## 2020-06-18 LAB — PROTEIN ELECTROPHORESIS, SERUM, WITH REFLEX
Albumin ELP: 3.7 g/dL — ABNORMAL LOW (ref 3.8–4.8)
Alpha 1: 0.2 g/dL (ref 0.2–0.3)
Alpha 2: 0.7 g/dL (ref 0.5–0.9)
Beta 2: 0.2 g/dL (ref 0.2–0.5)
Beta Globulin: 0.4 g/dL (ref 0.4–0.6)
Gamma Globulin: 0.9 g/dL (ref 0.8–1.7)
Total Protein: 6.2 g/dL (ref 6.1–8.1)

## 2020-06-18 LAB — TSH: TSH: 2.57 mIU/L (ref 0.40–4.50)

## 2020-06-18 LAB — VITAMIN D 25 HYDROXY (VIT D DEFICIENCY, FRACTURES): Vit D, 25-Hydroxy: 56 ng/mL (ref 30–100)

## 2020-06-18 LAB — PARATHYROID HORMONE, INTACT (NO CA): PTH: 31 pg/mL (ref 14–64)

## 2020-06-18 MED ORDER — DENOSUMAB 60 MG/ML ~~LOC~~ SOSY: 60.0000 mg | PREFILLED_SYRINGE | SUBCUTANEOUS | 0 refills | Status: DC

## 2020-06-18 NOTE — Progress Notes (Signed)
Anemia, and low protein noted.  All other labs are unremarkable.  We may start her on Prolia.

## 2020-06-18 NOTE — Addendum Note (Signed)
Addended by: Pollyann Savoy on: 06/18/2020 01:26 PM   Modules accepted: Orders

## 2020-06-18 NOTE — Addendum Note (Signed)
Addended by: Henriette Combs on: 06/18/2020 01:14 PM   Modules accepted: Orders

## 2020-06-19 MED FILL — PROLIA 60 MG/ML SOLN: 60 | 180 days supply | Qty: 1 | Fill #0

## 2020-06-20 NOTE — Telephone Encounter (Signed)
prolia received in the office and placed in the fridge.

## 2020-06-20 NOTE — Telephone Encounter (Signed)
Patient scheduled for 06/27/2020 at 10:30 am for Prolia injection.

## 2020-06-25 ENCOUNTER — Ambulatory Visit: Payer: Medicare Other | Attending: Rheumatology | Admitting: Physical Therapy

## 2020-06-25 ENCOUNTER — Other Ambulatory Visit: Payer: Self-pay

## 2020-06-25 ENCOUNTER — Encounter: Payer: Self-pay | Admitting: Physical Therapy

## 2020-06-25 DIAGNOSIS — R2689 Other abnormalities of gait and mobility: Secondary | ICD-10-CM

## 2020-06-25 DIAGNOSIS — M545 Low back pain, unspecified: Secondary | ICD-10-CM | POA: Diagnosis present

## 2020-06-25 DIAGNOSIS — R262 Difficulty in walking, not elsewhere classified: Secondary | ICD-10-CM

## 2020-06-25 NOTE — Patient Instructions (Signed)
Access Code: [email or web address] URL: [email or web address] Date: 06/25/2020 Prepared by: Lysle Rubens  Exercises Seated Long Arc Quad - 1 x daily - 7 x weekly - 3 sets - 10 reps Seated Hamstring Stretch - 1 x daily - 7 x weekly - 3 sets - 2 reps - 15-20 sec hold Sit to Stand without Arm Support - 1 x daily - 7 x weekly - 3 sets - 10 reps Seated March - 1 x daily - 7 x weekly - 3 sets - 10 reps

## 2020-06-25 NOTE — Therapy (Signed)
[facility] [place] [address]. [place], [name], [address] Phone: [phone]   Fax:  [phone]  Physical Therapy Evaluation  Patient Details  Name: Danielle Dean MRN: [id number] Date of Birth: May 24, 1929 Referring Provider (PT): Deveshwar   Encounter Date: 06/25/2020   PT End of Session - 06/25/20 1148    Visit Number 1    Date for PT Re-Evaluation 08/23/20    PT Start Time 1058    PT Stop Time 1138    PT Time Calculation (min) 40 min    Activity Tolerance Patient tolerated treatment well    Behavior During Therapy [facility] for tasks assessed/performed           Past Medical History:  Diagnosis Date  . Callus   . Cataract fragments in both eyes following surgery   . Corns and callosities   . Hx of hysterectomy   . Hypertension     Past Surgical History:  Procedure Laterality Date  . ABDOMINAL HYSTERECTOMY    . NASAL SEPTUM SURGERY    . ROTATOR CUFF REPAIR Right   . TOTAL SHOULDER ARTHROPLASTY      There were no vitals filed for this visit.    Subjective Assessment - 06/25/20 1059    Subjective Pt reports that she is having recurring LBP and knee pain. She was seen at the end of last year for a similar issue. Pt reports that she has osteoporosis and she will be starting injections soon. Pt reports that LBP is worse when standing for prolonged periods. Pt reports that knees also hurt with prolonged standing/walking.    Limitations Lifting;Standing;Walking;House hold activities    Patient Stated Goals have less pain, walk better    Currently in Pain? Yes    Pain Score 1     Pain Location Knee    Pain Orientation Right;Left    Pain Descriptors / Indicators Aching;Sore    Pain Type Chronic pain    Pain Onset More than a month ago    Pain Frequency Intermittent    Aggravating Factors  walk, stand, cook and clean pain up to 8/10    Pain Relieving Factors sit and rest    Effect of Pain on Daily Activities limited  standing and walking              [facility] PT Assessment - 06/25/20 0001      Assessment   Medical Diagnosis bilateral knee and LBP    Referring Provider (PT) Deveshwar    Hand Dominance Right    Prior Therapy 2021 for LBP and knee pain      Precautions   Precautions None      Restrictions   Weight Bearing Restrictions No      Balance Screen   Has the patient fallen in the past 6 months No    Has the patient had a decrease in activity level because of a fear of falling?  No    Is the patient reluctant to leave their home because of a fear of falling?  No      Home Environment   Additional Comments has stairs but does not do these, only cooks and cleans      Prior Function   Level of Independence Independent    Vocation Retired    Leisure walks about a block a day for exercise      Functional Tests   Functional tests Sit to Stand      Sit to  Stand   Comments pain in B knees with STS      Posture/Postural Control   Posture Comments weak abs, forward head, rounded shoulders kyphotic      AROM   Overall AROM Comments AROM of the knees WFL's with some tightness int eh HS      Strength   Overall Strength Comments LE strength 4-/5 B except hip abduction 3+/5 B      Flexibility   Soft Tissue Assessment /Muscle Length yes    Hamstrings tight B      Palpation   Palpation comment tightness lumbar paraspinals, tender to palpation B knee joint line      Berg Balance Test   Sit to Stand Able to stand without using hands and stabilize independently    Standing Unsupported Able to stand safely 2 minutes    Sitting with Back Unsupported but Feet Supported on Floor or Stool Able to sit safely and securely 2 minutes    Stand to Sit Sits safely with minimal use of hands    Transfers Able to transfer safely, minor use of hands    Standing Unsupported with Eyes Closed Able to stand 10 seconds safely    Standing Unsupported with Feet Together Able to place feet together  independently and stand 1 minute safely    From Standing, Reach Forward with Outstretched Arm Can reach confidently >25 cm (10")    From Standing Position, Pick up Object from Floor Able to pick up shoe safely and easily    From Standing Position, Turn to Look Behind Over each Shoulder Looks behind one side only/other side shows less weight shift    Turn 360 Degrees Able to turn 360 degrees safely in 4 seconds or less    Standing Unsupported, Alternately Place Feet on Step/Stool Able to stand independently and complete 8 steps >20 seconds    Standing Unsupported, One Foot in Front Able to plae foot ahead of the other independently and hold 30 seconds    Standing on One Leg Tries to lift leg/unable to hold 3 seconds but remains standing independently    Total Score 50      Timed Up and Go Test   Normal TUG (seconds) 13.59    TUG Comments no AD                      Objective measurements completed on examination: See above findings.       [facility] Adult PT Treatment/Exercise - 06/25/20 0001      Knee/Hip Exercises: Stretches   Active Hamstring Stretch Both;1 rep;20 seconds      Knee/Hip Exercises: Aerobic   Nustep L5 x 6 min      Knee/Hip Exercises: Machines for Strengthening   Other Machine rows and lats 10# 2x10      Knee/Hip Exercises: Seated   Long Arc Quad Both;1 set;10 reps    Marching Both;1 set;10 reps    Sit to [employer] 2 sets;5 reps;with UE support                  PT Education - 06/25/20 1147    Education Details Pt educated on POC and HEP    Person(s) Educated Patient    Methods Explanation;Demonstration;Handout    Comprehension Verbalized understanding;Returned demonstration            PT Short Term Goals - 06/25/20 1152      PT SHORT TERM GOAL #1   Title independent with initial  HEP    Time 2    Period Weeks    Status New    Target Date 07/09/20             PT Long Term Goals - 06/25/20 1152      PT LONG TERM GOAL #1   Title  tolerate walking >15 min with <5/10 pain    Time 6    Period Weeks    Status New    Target Date 08/06/20      PT LONG TERM GOAL #2   Title increase BLE hip abduction MMT to 4/5    Baseline 3+/5    Time 6    Period Weeks    Status New    Target Date 08/06/20      PT LONG TERM GOAL #3   Title report pain 50% less with cooking    Time 6    Period Weeks    Status New    Target Date 08/06/20                  Plan - 06/25/20 1148    Clinical Impression Statement Pt presents to clinic with reports of increasing B knee pain and LBP with prolonged standing/walking. Pt was discharged from PT in November 2021; states that since then she feels she has been regressing. Pt demos deficits in LE strength, TUG of 13.59 sec no AD, Berg Balance score 50/56, and pain in B knees with WB exercises. Pt strength has diminished slightly since d/c from PT but balance scores remain the same. Issued HEP to address LE strengthening. Initiated TE; plan to progress functional LE strengthening/endurance.    Stability/Clinical Decision Making Stable/Uncomplicated    Clinical Decision Making Low    Rehab Potential Good    PT Frequency 2x / week    PT Duration 6 weeks    PT Treatment/Interventions ADLs/Self Care Home Management;Electrical Stimulation;Moist Heat;Gait training;Neuromuscular re-education;Balance training;Therapeutic exercise;Therapeutic activities;Functional mobility training;Stair training;Patient/family education    PT Next Visit Plan functional LE strength/endurance    PT Home Exercise Plan see pt instructions    Consulted and Agree with Plan of Care Patient           Patient will benefit from skilled therapeutic intervention in order to improve the following deficits and impairments:  Abnormal gait,Decreased activity tolerance,Decreased balance,Decreased mobility,Decreased strength,Difficulty walking,Decreased safety awareness,Impaired flexibility  Visit Diagnosis: Other  abnormalities of gait and mobility  Acute bilateral low back pain without sciatica  Difficulty in walking, not elsewhere classified     Problem List Patient Active Problem List   Diagnosis Date Noted  . Porokeratosis 11/26/2018  . Corns and callosities 11/26/2018  . History of right shoulder replacement 10/07/2016  . Chronic pain of both knees 05/20/2016  . Osteoarthritis of both hands 05/20/2016  . Essential hypertension 05/20/2016  . Environmental allergies 05/20/2016  . Primary osteoarthritis of both knees 05/19/2016  . Callus of foot 11/24/2012  . Pain in joint, ankle and foot 09/15/2012    Lysle Rubens, PT, DPT Danielle Dean 06/25/2020, 11:54 AM  [facility] [address] [place], [name], [address] Phone: [phone]   Fax:  [phone]  Name: Danielle Dean MRN: [id number] Date of Birth: Sep 16, 1928

## 2020-06-27 ENCOUNTER — Ambulatory Visit (INDEPENDENT_AMBULATORY_CARE_PROVIDER_SITE_OTHER): Payer: Medicare Other | Admitting: *Deleted

## 2020-06-27 ENCOUNTER — Other Ambulatory Visit: Payer: Self-pay

## 2020-06-27 VITALS — BP 133/71 | HR 56

## 2020-06-27 DIAGNOSIS — M810 Age-related osteoporosis without current pathological fracture: Secondary | ICD-10-CM

## 2020-06-27 MED ORDER — DENOSUMAB 60 MG/ML ~~LOC~~ SOSY
60.0000 mg | PREFILLED_SYRINGE | Freq: Once | SUBCUTANEOUS | Status: AC
  Administered 2020-06-27: 60 mg via SUBCUTANEOUS

## 2020-06-27 NOTE — Progress Notes (Signed)
Pharmacy Note  Subjective:   Patient presents to clinic today to receive bi-annual dose of Prolia.  Patient running a fever or have signs/symptoms of infection? No  Patient currently on antibiotics for the treatment of infection? No  Patient had fall in the last 6 months?  No  If yes, did it require medical attention? No   Patient taking calcium 1200 mg daily through diet or supplement and at least 800 units vitamin D? No  Objective: CMP     Component Value Date/Time   NA 130 (L) 06/14/2020 0000   K 4.8 06/14/2020 0000   CL 99 06/14/2020 0000   CO2 26 06/14/2020 0000   GLUCOSE 107 (H) 06/14/2020 0000   BUN 17 06/14/2020 0000   CREATININE 0.76 06/14/2020 0000   CALCIUM 8.9 06/14/2020 0000   PROT 6.2 06/14/2020 0000   PROT 6.0 (L) 06/14/2020 0000   AST 16 06/14/2020 0000   ALT 12 06/14/2020 0000   BILITOT 0.4 06/14/2020 0000   GFRNONAA 70 06/14/2020 0000   GFRAA 81 06/14/2020 0000    CBC    Component Value Date/Time   WBC 7.0 06/14/2020 0000   RBC 3.54 (L) 06/14/2020 0000   HGB 11.1 (L) 06/14/2020 0000   HCT 32.6 (L) 06/14/2020 0000   PLT 239 06/14/2020 0000   MCV 92.1 06/14/2020 0000   MCH 31.4 06/14/2020 0000   MCHC 34.0 06/14/2020 0000   RDW 11.7 06/14/2020 0000   LYMPHSABS 1,491 06/14/2020 0000   EOSABS 28 06/14/2020 0000   BASOSABS 28 06/14/2020 0000    Lab Results  Component Value Date   VD25OH 56 06/14/2020    T-score: -4.6  Assessment/Plan:   Administrations This Visit    denosumab (PROLIA) injection 60 mg    Admin Date 06/27/2020 Action Given Dose 60 mg Route Subcutaneous Administered By Henriette Combs, LPN          Patient tolerated injection well. Patient was monitored in office for 30 minutes for adverse reactions. No adverse reactions noted.    All questions encouraged and answered.  Instructed patient to call with any further questions or concerns.

## 2020-06-28 ENCOUNTER — Other Ambulatory Visit: Payer: Self-pay

## 2020-06-28 ENCOUNTER — Encounter: Payer: Self-pay | Admitting: Physical Therapy

## 2020-06-28 ENCOUNTER — Ambulatory Visit: Payer: Medicare Other | Admitting: Physical Therapy

## 2020-06-28 ENCOUNTER — Ambulatory Visit: Payer: Medicare Other | Admitting: Rheumatology

## 2020-06-28 DIAGNOSIS — R262 Difficulty in walking, not elsewhere classified: Secondary | ICD-10-CM

## 2020-06-28 DIAGNOSIS — M545 Low back pain, unspecified: Secondary | ICD-10-CM

## 2020-06-28 DIAGNOSIS — R2689 Other abnormalities of gait and mobility: Secondary | ICD-10-CM

## 2020-06-28 NOTE — Therapy (Signed)
[facility] [place] [facility] [address] [place], [name], [address] Phone: [phone]   Fax:  [phone]  Physical Therapy Treatment  Patient Details  Name: Danielle Dean MRN: [id number] Date of Birth: [birth year] Referring Provider (PT): Deveshwar   Encounter Date: 06/28/2020   PT End of Session - 06/28/20 1142    Visit Number 2    Date for PT Re-Evaluation 08/23/20    PT Start Time 1059    PT Stop Time 1140    PT Time Calculation (min) 41 min    Activity Tolerance Patient tolerated treatment well    Behavior During Therapy [facility] for tasks assessed/performed           Past Medical History:  Diagnosis Date  . Callus   . Cataract fragments in both eyes following surgery   . Corns and callosities   . Hx of hysterectomy   . Hypertension     Past Surgical History:  Procedure Laterality Date  . ABDOMINAL HYSTERECTOMY    . NASAL SEPTUM SURGERY    . ROTATOR CUFF REPAIR Right   . TOTAL SHOULDER ARTHROPLASTY      There were no vitals filed for this visit.   Subjective Assessment - 06/28/20 1111    Subjective Pt reports she had injection yesterday; knees are a little sore today. States that LB is feeling pretty good this morning.    Currently in Pain? Yes    Pain Score 2     Pain Location Knee    Pain Orientation Right;Left                             [facility] Adult PT Treatment/Exercise - 06/28/20 0001      High Level Balance   High Level Balance Activities Side stepping;Tandem walking    High Level Balance Comments side step & tandem walking on soft balance beam in II bars       Knee/Hip Exercises: Aerobic   Nustep L4 x 7 min    Other Aerobic UBE L1 2 min each      Knee/Hip Exercises: Machines for Strengthening   Other Machine rows and lats 5#, 10# 2x10      Knee/Hip Exercises: Standing   Heel Raises Both;1 set;10 reps    Hip Abduction Both;1 set;10 reps    Hip Extension Both;1 set;10 reps    Other  Standing Knee Exercises alt step taps 6" stairs 1x10 B    Other Standing Knee Exercises stairs 4" and 6" with no HR SBA      Knee/Hip Exercises: Seated   Marching Both;1 set;10 reps    Hamstring Curl Both;2 sets;10 reps    Hamstring Limitations red                    PT Short Term Goals - 06/25/20 1152      PT SHORT TERM GOAL #1   Title independent with initial HEP    Time 2    Period Weeks    Status New    Target Date 07/09/20             PT Long Term Goals - 06/25/20 1152      PT LONG TERM GOAL #1   Title tolerate walking >15 min with <5/10 pain    Time 6    Period Weeks    Status New    Target Date 08/06/20  PT LONG TERM GOAL #2   Title increase BLE hip abduction MMT to 4/5    Baseline 3+/5    Time 6    Period Weeks    Status New    Target Date 08/06/20      PT LONG TERM GOAL #3   Title report pain 50% less with cooking    Time 6    Period Weeks    Status New    Target Date 08/06/20                 Plan - 06/28/20 1142    Clinical Impression Statement Pt demos mild increase in B knee soreness today following injections and reports increase in LBP this rx. Pt CGA for high level balance ex's; cuing to avoid using UE support. Pt demos circumduction of LEs with step taps, cuing to reduce compensation. Limited ex's in standing this rx d/t injection yesterday. Frequent rest breaks. Continue to progress functional strength/endurance.    PT Treatment/Interventions ADLs/Self Care Home Management;Electrical Stimulation;Moist Heat;Gait training;Neuromuscular re-education;Balance training;Therapeutic exercise;Therapeutic activities;Functional mobility training;Stair training;Patient/family education    PT Next Visit Plan functional LE strength/endurance    Consulted and Agree with Plan of Care Patient           Patient will benefit from skilled therapeutic intervention in order to improve the following deficits and impairments:  Abnormal  gait,Decreased activity tolerance,Decreased balance,Decreased mobility,Decreased strength,Difficulty walking,Decreased safety awareness,Impaired flexibility  Visit Diagnosis: Other abnormalities of gait and mobility  Acute bilateral low back pain without sciatica  Difficulty in walking, not elsewhere classified     Problem List Patient Active Problem List   Diagnosis Date Noted  . Porokeratosis 11/26/2018  . Corns and callosities 11/26/2018  . History of right shoulder replacement 10/07/2016  . Chronic pain of both knees 05/20/2016  . Osteoarthritis of both hands 05/20/2016  . Essential hypertension 05/20/2016  . Environmental allergies 05/20/2016  . Primary osteoarthritis of both knees 05/19/2016  . Callus of foot 11/24/2012  . Pain in joint, ankle and foot 09/15/2012   Lysle Rubens, PT, DPT Maryanna Shape Angelie Kram 06/28/2020, 11:44 AM  [facility] [place] [facility] [address] [place], [name], [address] Phone: [phone]   Fax:  [phone]  Name: Danielle Dean MRN: [id number] Date of Birth: 13-Sep-1928

## 2020-07-03 ENCOUNTER — Ambulatory Visit: Payer: Medicare Other | Admitting: Physical Therapy

## 2020-07-03 ENCOUNTER — Encounter: Payer: Self-pay | Admitting: Physical Therapy

## 2020-07-03 ENCOUNTER — Other Ambulatory Visit: Payer: Self-pay

## 2020-07-03 DIAGNOSIS — R262 Difficulty in walking, not elsewhere classified: Secondary | ICD-10-CM

## 2020-07-03 DIAGNOSIS — R2689 Other abnormalities of gait and mobility: Secondary | ICD-10-CM | POA: Diagnosis not present

## 2020-07-03 DIAGNOSIS — M545 Low back pain, unspecified: Secondary | ICD-10-CM

## 2020-07-03 NOTE — Therapy (Signed)
[facility] [place] [facility] [address]. [place], [name], [address] Phone: [phone]   Fax:  [phone]  Physical Therapy Treatment  Patient Details  Name: Danielle Dean MRN: [id number] Date of Birth: [birth year] Referring Provider (PT): Deveshwar   Encounter Date: 07/03/2020   PT End of Session - 07/03/20 1140    Visit Number 3    Date for PT Re-Evaluation 08/23/20    PT Start Time 1052    PT Stop Time 1137    PT Time Calculation (min) 45 min    Activity Tolerance Patient tolerated treatment well    Behavior During Therapy [facility] for tasks assessed/performed           Past Medical History:  Diagnosis Date  . Callus   . Cataract fragments in both eyes following surgery   . Corns and callosities   . Hx of hysterectomy   . Hypertension     Past Surgical History:  Procedure Laterality Date  . ABDOMINAL HYSTERECTOMY    . NASAL SEPTUM SURGERY    . ROTATOR CUFF REPAIR Right   . TOTAL SHOULDER ARTHROPLASTY      There were no vitals filed for this visit.   Subjective Assessment - 07/03/20 1056    Subjective Pt reports not too much change since injection. Feels good this morning, states pain increases as the day goes on    Currently in Pain? No/denies    Pain Score 0-No pain                             [facility] Adult PT Treatment/Exercise - 07/03/20 0001      High Level Balance   High Level Balance Activities Side stepping;Backward walking    High Level Balance Comments side stepping on foam balance beam, bkwds walking in II bars      Knee/Hip Exercises: Aerobic   Nustep L4 x 6 min    Other Aerobic UBE L1 2 min each      Knee/Hip Exercises: Machines for Strengthening   Other Machine rows and lats 5#, 10# 2x10      Knee/Hip Exercises: Standing   Hip Abduction Both;1 set;10 reps    Hip Extension Both;1 set;10 reps    Other Standing Knee Exercises alt step taps 6" stairs 1x10 B      Knee/Hip Exercises:  Seated   Long Arc Quad Both;10 reps;2 sets    Long Arc Quad Weight 2 lbs.    Ball Squeeze x15 3 sec hold    Marching Both;2 sets;10 reps    Marching Weights 2 lbs.    Hamstring Curl Both;2 sets;10 reps    Hamstring Limitations red    Sit to Sand 2 sets;5 reps;with UE support                    PT Short Term Goals - 07/03/20 1141      PT SHORT TERM GOAL #1   Title independent with initial HEP    Time 2    Period Weeks    Status Achieved    Target Date 07/09/20             PT Long Term Goals - 06/25/20 1152      PT LONG TERM GOAL #1   Title tolerate walking >15 min with <5/10 pain    Time 6    Period Weeks    Status New  Target Date 08/06/20      PT LONG TERM GOAL #2   Title increase BLE hip abduction MMT to 4/5    Baseline 3+/5    Time 6    Period Weeks    Status New    Target Date 08/06/20      PT LONG TERM GOAL #3   Title report pain 50% less with cooking    Time 6    Period Weeks    Status New    Target Date 08/06/20                 Plan - 07/03/20 1140    Clinical Impression Statement Pt with improved knee pain and LBP symptoms this rx. Increased stability with high level balance ex's; SBA for sidestepping on foam. Pt demos difficulty with STS requiring UE assist from lower surfaces d/t pain in knees. Cues for form with seated rows. Continue to progress functional strength/endurance.    PT Treatment/Interventions ADLs/Self Care Home Management;Electrical Stimulation;Moist Heat;Gait training;Neuromuscular re-education;Balance training;Therapeutic exercise;Therapeutic activities;Functional mobility training;Stair training;Patient/family education    PT Next Visit Plan functional LE strength/endurance    Consulted and Agree with Plan of Care Patient           Patient will benefit from skilled therapeutic intervention in order to improve the following deficits and impairments:  Abnormal gait,Decreased activity tolerance,Decreased  balance,Decreased mobility,Decreased strength,Difficulty walking,Decreased safety awareness,Impaired flexibility  Visit Diagnosis: Other abnormalities of gait and mobility  Acute bilateral low back pain without sciatica  Difficulty in walking, not elsewhere classified     Problem List Patient Active Problem List   Diagnosis Date Noted  . Porokeratosis 11/26/2018  . Corns and callosities 11/26/2018  . History of right shoulder replacement 10/07/2016  . Chronic pain of both knees 05/20/2016  . Osteoarthritis of both hands 05/20/2016  . Essential hypertension 05/20/2016  . Environmental allergies 05/20/2016  . Primary osteoarthritis of both knees 05/19/2016  . Callus of foot 11/24/2012  . Pain in joint, ankle and foot 09/15/2012   Danielle Dean, PT, DPT Danielle Dean Danielle Dean 07/03/2020, 11:42 AM  [facility] [place] [facility] [address] [facility] [place], [name], [address] Phone: [phone]   Fax:  [phone]  Name: Danielle Dean MRN: [id number] Date of Birth: 09-20-1928

## 2020-07-05 ENCOUNTER — Other Ambulatory Visit: Payer: Self-pay

## 2020-07-05 ENCOUNTER — Encounter: Payer: Self-pay | Admitting: Physical Therapy

## 2020-07-05 ENCOUNTER — Ambulatory Visit: Payer: Medicare Other | Admitting: Physical Therapy

## 2020-07-05 DIAGNOSIS — M545 Low back pain, unspecified: Secondary | ICD-10-CM

## 2020-07-05 DIAGNOSIS — R262 Difficulty in walking, not elsewhere classified: Secondary | ICD-10-CM

## 2020-07-05 DIAGNOSIS — R2689 Other abnormalities of gait and mobility: Secondary | ICD-10-CM | POA: Diagnosis not present

## 2020-07-05 NOTE — Therapy (Signed)
[facility] [address] [place], [name], [address] Phone: [phone]   Fax:  [phone]  Physical Therapy Treatment  Patient Details  Name: Danielle Dean MRN: [id number] Date of Birth: 1929/01/20 Referring Provider (PT): Deveshwar   Encounter Date: 07/05/2020   PT End of Session - 07/05/20 1154    Visit Number 4    Date for PT Re-Evaluation 08/23/20    PT Start Time 1057    PT Stop Time 1140    PT Time Calculation (min) 43 min    Activity Tolerance Patient tolerated treatment well    Behavior During Therapy [facility] for tasks assessed/performed           Past Medical History:  Diagnosis Date  . Callus   . Cataract fragments in both eyes following surgery   . Corns and callosities   . Hx of hysterectomy   . Hypertension     Past Surgical History:  Procedure Laterality Date  . ABDOMINAL HYSTERECTOMY    . NASAL SEPTUM SURGERY    . ROTATOR CUFF REPAIR Right   . TOTAL SHOULDER ARTHROPLASTY      There were no vitals filed for this visit.   Subjective Assessment - 07/05/20 1103    Subjective Pt reports no knee pain this morning; states she has not been up long enough to really tell how knees feeling    Currently in Pain? No/denies    Pain Score 0-No pain                             [facility] Adult PT Treatment/Exercise - 07/05/20 0001      High Level Balance   High Level Balance Activities Side stepping;Backward walking;Tandem walking    High Level Balance Comments side stepping on foam balance beam, bkwds walking in II bars      Knee/Hip Exercises: Aerobic   Nustep L5 x 6 min    Other Aerobic UBE L1 2 min each      Knee/Hip Exercises: Machines for Strengthening   Other Machine rows and lats 5#, 10# 2x10      Knee/Hip Exercises: Standing   Hip Abduction Both;1 set;10 reps    Abduction Limitations 2#    Hip Extension Both;1 set;10 reps    Extension Limitations 2#    Other Standing Knee  Exercises alt step taps 6" stairs 1x10 B      Knee/Hip Exercises: Seated   Long Arc Quad Both;10 reps;2 sets    Long Arc Quad Weight 2 lbs.    Clamshell with TheraBand Red   2x10   Marching Both;2 sets;10 reps    Marching Weights 2 lbs.    Hamstring Curl Both;2 sets;10 reps    Hamstring Limitations red    Sit to Sand 2 sets;5 reps;with UE support                    PT Short Term Goals - 07/03/20 1141      PT SHORT TERM GOAL #1   Title independent with initial HEP    Time 2    Period Weeks    Status Achieved    Target Date 07/09/20             PT Long Term Goals - 06/25/20 1152      PT LONG TERM GOAL #1   Title tolerate walking >15 min with <5/10 pain    Time  6    Period Weeks    Status New    Target Date 08/06/20      PT LONG TERM GOAL #2   Title increase BLE hip abduction MMT to 4/5    Baseline 3+/5    Time 6    Period Weeks    Status New    Target Date 08/06/20      PT LONG TERM GOAL #3   Title report pain 50% less with cooking    Time 6    Period Weeks    Status New    Target Date 08/06/20                 Plan - 07/05/20 1154    Clinical Impression Statement Pt demos improved stability with high level balance ex's this rx. Occasional posterior LOB with SBA but able to utilize ankle strategy to self correct. Still with B knee pain for STS exercise, limited tolerance for standing ex's d/t increasing knee pain. Continue to progress functional strength/endurance.    PT Treatment/Interventions ADLs/Self Care Home Management;Electrical Stimulation;Moist Heat;Gait training;Neuromuscular re-education;Balance training;Therapeutic exercise;Therapeutic activities;Functional mobility training;Stair training;Patient/family education    PT Next Visit Plan functional LE strength/endurance    Consulted and Agree with Plan of Care Patient           Patient will benefit from skilled therapeutic intervention in order to improve the following deficits  and impairments:  Abnormal gait,Decreased activity tolerance,Decreased balance,Decreased mobility,Decreased strength,Difficulty walking,Decreased safety awareness,Impaired flexibility  Visit Diagnosis: Other abnormalities of gait and mobility  Acute bilateral low back pain without sciatica  Difficulty in walking, not elsewhere classified     Problem List Patient Active Problem List   Diagnosis Date Noted  . Porokeratosis 11/26/2018  . Corns and callosities 11/26/2018  . History of right shoulder replacement 10/07/2016  . Chronic pain of both knees 05/20/2016  . Osteoarthritis of both hands 05/20/2016  . Essential hypertension 05/20/2016  . Environmental allergies 05/20/2016  . Primary osteoarthritis of both knees 05/19/2016  . Callus of foot 11/24/2012  . Pain in joint, ankle and foot 09/15/2012   Lysle Rubens, PT, DPT [name] 07/05/2020, 11:56 AM  [facility] [address] [place], [name], [address] Phone: [phone]   Fax:  [phone]  Name: Danielle Dean MRN: [id number] Date of Birth: 1928-08-26

## 2020-07-11 ENCOUNTER — Ambulatory Visit: Payer: Medicare Other

## 2020-07-11 ENCOUNTER — Other Ambulatory Visit: Payer: Self-pay

## 2020-07-11 DIAGNOSIS — R2689 Other abnormalities of gait and mobility: Secondary | ICD-10-CM | POA: Diagnosis not present

## 2020-07-11 DIAGNOSIS — M545 Low back pain, unspecified: Secondary | ICD-10-CM

## 2020-07-11 DIAGNOSIS — R262 Difficulty in walking, not elsewhere classified: Secondary | ICD-10-CM

## 2020-07-11 NOTE — Therapy (Signed)
[facility] [address] [place], [name], [address] Phone: [phone]   Fax:  [phone]  Physical Therapy Treatment  Patient Details  Name: Danielle Dean MRN: [id number] Date of Birth: 1928/07/18 Referring Provider (PT): Deveshwar   Encounter Date: 07/11/2020   PT End of Session - 07/11/20 1032    Visit Number 5    Date for PT Re-Evaluation 08/23/20    PT Start Time 1016    Activity Tolerance Patient tolerated treatment well    Behavior During Therapy [facility] for tasks assessed/performed           Past Medical History:  Diagnosis Date  . Callus   . Cataract fragments in both eyes following surgery   . Corns and callosities   . Hx of hysterectomy   . Hypertension     Past Surgical History:  Procedure Laterality Date  . ABDOMINAL HYSTERECTOMY    . NASAL SEPTUM SURGERY    . ROTATOR CUFF REPAIR Right   . TOTAL SHOULDER ARTHROPLASTY      There were no vitals filed for this visit.   Subjective Assessment - 07/11/20 1018    Subjective Pt reports no knee pain this morning; states she has not been up long enough to really tell how knee is feeling    Limitations Lifting;Standing;Walking;House hold activities    Patient Stated Goals have less pain, walk better    Currently in Pain? No/denies                             [facility] Adult PT Treatment/Exercise - 07/11/20 0001      Ambulation/Gait   Gait Comments step up with 4 in stairs and bilat rail 2x5 each, Step downs 4" stairs x5 each leg.  lateral stepping up/down stairs with BUE on HR      High Level Balance   High Level Balance Activities Side stepping;Backward walking;Tandem walking    High Level Balance Comments side stepping & tandem on foam balance beam, bkwds walking in II bars      Knee/Hip Exercises: Aerobic   Nustep L5 x 6 min    Other Aerobic UBE L1 2 min each      Knee/Hip Exercises: Machines for Strengthening   Other Machine rows  5# 2 x 10, lats 10# 2x10      Knee/Hip Exercises: Standing   Hip Abduction Both;10 reps;2 sets    Abduction Limitations 2#    Hip Extension Both;10 reps;2 sets    Extension Limitations 2#    Other Standing Knee Exercises alt step taps 6" stairs 2x10 B      Knee/Hip Exercises: Seated   Long Arc Quad Both;10 reps;2 sets    Long Arc Quad Weight 2 lbs.    Clamshell with TheraBand Red    Marching Both;2 sets;10 reps    Marching Weights 2 lbs.    Sit to Sand 5 reps;2 sets;without UE support                    PT Short Term Goals - 07/03/20 1141      PT SHORT TERM GOAL #1   Title independent with initial HEP    Time 2    Period Weeks    Status Achieved    Target Date 07/09/20             PT Long Term Goals - 06/25/20 1152  PT LONG TERM GOAL #1   Title tolerate walking >15 min with <5/10 pain    Time 6    Period Weeks    Status New    Target Date 08/06/20      PT LONG TERM GOAL #2   Title increase BLE hip abduction MMT to 4/5    Baseline 3+/5    Time 6    Period Weeks    Status New    Target Date 08/06/20      PT LONG TERM GOAL #3   Title report pain 50% less with cooking    Time 6    Period Weeks    Status New    Target Date 08/06/20                 Plan - 07/11/20 1033    Clinical Impression Statement Pt tolerated exercises well today. Balance was most challenged with tandem and side stepping on foam balance beam, as well as with bwd walking. Did stairs today with trial of repeated step downs from 4" step with BUE support which were difficult, caused some knee pain. Reviewed lateral step ups/downs with BUE on 1 HR as a strategy to safely negotiate stairs if needed.    Rehab Potential Good    PT Frequency 2x / week    PT Duration 6 weeks    PT Treatment/Interventions ADLs/Self Care Home Management;Electrical Stimulation;Moist Heat;Gait training;Neuromuscular re-education;Balance training;Therapeutic exercise;Therapeutic  activities;Functional mobility training;Stair training;Patient/family education    PT Next Visit Plan functional LE strength/endurance    Consulted and Agree with Plan of Care Patient           Patient will benefit from skilled therapeutic intervention in order to improve the following deficits and impairments:  Abnormal gait,Decreased activity tolerance,Decreased balance,Decreased mobility,Decreased strength,Difficulty walking,Decreased safety awareness,Impaired flexibility  Visit Diagnosis: Other abnormalities of gait and mobility  Acute bilateral low back pain without sciatica  Difficulty in walking, not elsewhere classified     Problem List Patient Active Problem List   Diagnosis Date Noted  . Porokeratosis 11/26/2018  . Corns and callosities 11/26/2018  . History of right shoulder replacement 10/07/2016  . Chronic pain of both knees 05/20/2016  . Osteoarthritis of both hands 05/20/2016  . Essential hypertension 05/20/2016  . Environmental allergies 05/20/2016  . Primary osteoarthritis of both knees 05/19/2016  . Callus of foot 11/24/2012  . Pain in joint, ankle and foot 09/15/2012    Anson Crofts, PT, DPT 07/11/2020, 12:14 PM  [facility] [address] [place], [name], [address] Phone: [phone]   Fax:  [phone]  Name: Danielle Dean MRN: [id number] Date of Birth: 04-27-1929

## 2020-07-13 ENCOUNTER — Encounter: Payer: Self-pay | Admitting: Physical Therapy

## 2020-07-13 ENCOUNTER — Other Ambulatory Visit: Payer: Self-pay

## 2020-07-13 ENCOUNTER — Ambulatory Visit: Payer: Medicare Other | Admitting: Physical Therapy

## 2020-07-13 DIAGNOSIS — M545 Low back pain, unspecified: Secondary | ICD-10-CM

## 2020-07-13 DIAGNOSIS — R2689 Other abnormalities of gait and mobility: Secondary | ICD-10-CM

## 2020-07-13 DIAGNOSIS — R262 Difficulty in walking, not elsewhere classified: Secondary | ICD-10-CM

## 2020-07-13 NOTE — Therapy (Signed)
[facility] [address] [place], [name], [address] Phone: [phone]   Fax:  [phone]  Physical Therapy Treatment  Patient Details  Name: Danielle Dean MRN: [id number] Date of Birth: [birth year] Referring Provider (PT): Deveshwar   Encounter Date: 07/13/2020   PT End of Session - 07/13/20 1140    Visit Number 6    Date for PT Re-Evaluation 08/23/20    PT Start Time 1055    PT Stop Time 1141    PT Time Calculation (min) 46 min    Activity Tolerance Patient tolerated treatment well    Behavior During Therapy [facility] for tasks assessed/performed           Past Medical History:  Diagnosis Date  . Callus   . Cataract fragments in both eyes following surgery   . Corns and callosities   . Hx of hysterectomy   . Hypertension     Past Surgical History:  Procedure Laterality Date  . ABDOMINAL HYSTERECTOMY    . NASAL SEPTUM SURGERY    . ROTATOR CUFF REPAIR Right   . TOTAL SHOULDER ARTHROPLASTY      There were no vitals filed for this visit.   Subjective Assessment - 07/13/20 1053    Subjective Pt reports no knee pain in the morning; gets worse throughout the day.    Currently in Pain? No/denies                             [facility] Adult PT Treatment/Exercise - 07/13/20 0001      High Level Balance   High Level Balance Activities Side stepping;Backward walking;Tandem walking;Marching forwards;Marching backwards    High Level Balance Comments sidestepping on foam balance beam, fwd/bkwd on firm surface, marching fwd/bkwd firm surface, marching on airex, fwd and lat stepping onto airex x5 B      Knee/Hip Exercises: Aerobic   Nustep L5 x 6 min    Other Aerobic UBE L1 2 min each      Knee/Hip Exercises: Machines for Strengthening   Other Machine rows 5# 2 x 10, lats 10# 2x10      Knee/Hip Exercises: Seated   Long Arc Quad Both;10 reps;2 sets    Long Arc Quad Limitations 2.5#    Clamshell with  TheraBand Red   1x15   Marching Both;2 sets;10 reps    Marching Limitations 2.5#    Sit to [employer] 5 reps;2 sets;without UE support                    PT Short Term Goals - 07/03/20 1141      PT SHORT TERM GOAL #1   Title independent with initial HEP    Time 2    Period Weeks    Status Achieved    Target Date 07/09/20             PT Long Term Goals - 06/25/20 1152      PT LONG TERM GOAL #1   Title tolerate walking >15 min with <5/10 pain    Time 6    Period Weeks    Status New    Target Date 08/06/20      PT LONG TERM GOAL #2   Title increase BLE hip abduction MMT to 4/5    Baseline 3+/5    Time 6    Period Weeks    Status New    Target Date 08/06/20  PT LONG TERM GOAL #3   Title report pain 50% less with cooking    Time 6    Period Weeks    Status New    Target Date 08/06/20                 Plan - 07/13/20 1141    Clinical Impression Statement Pt demos improved stability with balance ex's this rx. Did demo occasional posterior LOB with sidestepping on foam. Cues for increased step length when stepping onto/off airex and with backwards walking. Extensive functional strengthening ex's and balance training this rx.    PT Treatment/Interventions ADLs/Self Care Home Management;Electrical Stimulation;Moist Heat;Gait training;Neuromuscular re-education;Balance training;Therapeutic exercise;Therapeutic activities;Functional mobility training;Stair training;Patient/family education    PT Next Visit Plan functional LE strength/endurance    Consulted and Agree with Plan of Care Patient           Patient will benefit from skilled therapeutic intervention in order to improve the following deficits and impairments:  Abnormal gait,Decreased activity tolerance,Decreased balance,Decreased mobility,Decreased strength,Difficulty walking,Decreased safety awareness,Impaired flexibility  Visit Diagnosis: Other abnormalities of gait and mobility  Acute  bilateral low back pain without sciatica  Difficulty in walking, not elsewhere classified     Problem List Patient Active Problem List   Diagnosis Date Noted  . Porokeratosis 11/26/2018  . Corns and callosities 11/26/2018  . History of right shoulder replacement 10/07/2016  . Chronic pain of both knees 05/20/2016  . Osteoarthritis of both hands 05/20/2016  . Essential hypertension 05/20/2016  . Environmental allergies 05/20/2016  . Primary osteoarthritis of both knees 05/19/2016  . Callus of foot 11/24/2012  . Pain in joint, ankle and foot 09/15/2012   Lysle Rubens, PT, DPT Maryanna Shape Abdur Hoglund 07/13/2020, 11:46 AM  [facility] [address] [place], [name], [address] Phone: [phone]   Fax:  [phone]  Name: Danielle Dean MRN: [id number] Date of Birth: 07-16-1928

## 2020-07-16 ENCOUNTER — Encounter: Payer: Self-pay | Admitting: Physical Therapy

## 2020-07-16 ENCOUNTER — Ambulatory Visit: Payer: Medicare Other | Admitting: Physical Therapy

## 2020-07-16 ENCOUNTER — Other Ambulatory Visit: Payer: Self-pay

## 2020-07-16 DIAGNOSIS — R2689 Other abnormalities of gait and mobility: Secondary | ICD-10-CM

## 2020-07-16 DIAGNOSIS — M545 Low back pain, unspecified: Secondary | ICD-10-CM

## 2020-07-16 DIAGNOSIS — R262 Difficulty in walking, not elsewhere classified: Secondary | ICD-10-CM

## 2020-07-16 NOTE — Therapy (Signed)
[facility] [address] [place], [name], [address] Phone: [phone]   Fax:  [phone]  Physical Therapy Treatment  Patient Details  Name: Danielle Dean MRN: [id number] Date of Birth: 02-02-1929 Referring Provider (PT): Deveshwar   Encounter Date: 07/16/2020   PT End of Session - 07/16/20 1140    Visit Number 7    Date for PT Re-Evaluation 08/23/20    PT Start Time 1057    PT Stop Time 1139    PT Time Calculation (min) 42 min    Activity Tolerance Patient tolerated treatment well    Behavior During Therapy [facility] for tasks assessed/performed           Past Medical History:  Diagnosis Date  . Callus   . Cataract fragments in both eyes following surgery   . Corns and callosities   . Hx of hysterectomy   . Hypertension     Past Surgical History:  Procedure Laterality Date  . ABDOMINAL HYSTERECTOMY    . NASAL SEPTUM SURGERY    . ROTATOR CUFF REPAIR Right   . TOTAL SHOULDER ARTHROPLASTY      There were no vitals filed for this visit.   Subjective Assessment - 07/16/20 1100    Subjective Pt reports R knee is mildly painful today; states she was able to climb stairs at [facility] this past weekend.    Currently in Pain? Yes    Pain Score 1     Pain Location Knee    Pain Orientation Right                             [facility] Adult PT Treatment/Exercise - 07/16/20 0001      High Level Balance   High Level Balance Activities Side stepping;Backward walking;Tandem walking;Marching forwards;Marching backwards    High Level Balance Comments sidestepping on foam balance beam, fwd/bkwd on firm surface, marching fwd/bkwd firm surface, tandem fwd/bkwd walking firm surface      Knee/Hip Exercises: Aerobic   Nustep L5 x 6 min    Other Aerobic UBE L1 2 min each      Knee/Hip Exercises: Machines for Strengthening   Other Machine rows 5# 2 x 10, lats 10# 2x10      Knee/Hip Exercises: Standing   Other  Standing Knee Exercises alt step taps 6" stairs 2x10 B    Other Standing Knee Exercises step ups x5 6" stair one HR      Knee/Hip Exercises: Seated   Long Arc Quad Both;10 reps;2 sets    Long Arc Quad Limitations 2.5#    [employer] x15 3 sec hold    Marching Both;2 sets;10 reps    Marching Limitations 2.5#    Hamstring Curl Both;2 sets;10 reps    Hamstring Limitations red   reports mild cramping in HS following this exercise   Sit to [employer] 10 reps;without UE support;2 sets                    PT Short Term Goals - 07/03/20 1141      PT SHORT TERM GOAL #1   Title independent with initial HEP    Time 2    Period Weeks    Status Achieved    Target Date 07/09/20             PT Long Term Goals - 06/25/20 1152      PT LONG TERM GOAL #  1   Title tolerate walking >15 min with <5/10 pain    Time 6    Period Weeks    Status New    Target Date 08/06/20      PT LONG TERM GOAL #2   Title increase BLE hip abduction MMT to 4/5    Baseline 3+/5    Time 6    Period Weeks    Status New    Target Date 08/06/20      PT LONG TERM GOAL #3   Title report pain 50% less with cooking    Time 6    Period Weeks    Status New    Target Date 08/06/20                 Plan - 07/16/20 1140    Clinical Impression Statement Pt demos occasional LOB requiring CGA with alt step taps and step ups 6" stair. Difficulty with reducing hip compensation with forward step ups. Did well with balance ex's; SBA except for sidestepping on foam, CGA d/t occasional posterior LOB. Pt reports improved community mobility and able to climb stairs at [employer] this past weekend. Continue to progress functional strengthening and balance.    PT Treatment/Interventions ADLs/Self Care Home Management;Electrical Stimulation;Moist Heat;Gait training;Neuromuscular re-education;Balance training;Therapeutic exercise;Therapeutic activities;Functional mobility training;Stair training;Patient/family education    PT  Next Visit Plan functional LE strength/endurance    Consulted and Agree with Plan of Care Patient           Patient will benefit from skilled therapeutic intervention in order to improve the following deficits and impairments:  Abnormal gait,Decreased activity tolerance,Decreased balance,Decreased mobility,Decreased strength,Difficulty walking,Decreased safety awareness,Impaired flexibility  Visit Diagnosis: Other abnormalities of gait and mobility  Acute bilateral low back pain without sciatica  Difficulty in walking, not elsewhere classified     Problem List Patient Active Problem List   Diagnosis Date Noted  . Porokeratosis 11/26/2018  . Corns and callosities 11/26/2018  . History of right shoulder replacement 10/07/2016  . Chronic pain of both knees 05/20/2016  . Osteoarthritis of both hands 05/20/2016  . Essential hypertension 05/20/2016  . Environmental allergies 05/20/2016  . Primary osteoarthritis of both knees 05/19/2016  . Callus of foot 11/24/2012  . Pain in joint, ankle and foot 09/15/2012   Lysle Rubens, PT, DPT Danielle Dean 07/16/2020, 11:42 AM  [facility] [address] [place], [name], [address] Phone: [phone]   Fax:  [phone]  Name: Danielle Dean MRN: [id number] Date of Birth: 1929/03/09

## 2020-07-18 ENCOUNTER — Ambulatory Visit: Payer: Medicare Other | Attending: Rheumatology | Admitting: Physical Therapy

## 2020-07-18 ENCOUNTER — Encounter: Payer: Self-pay | Admitting: Physical Therapy

## 2020-07-18 ENCOUNTER — Other Ambulatory Visit: Payer: Self-pay

## 2020-07-18 DIAGNOSIS — M545 Low back pain, unspecified: Secondary | ICD-10-CM | POA: Diagnosis present

## 2020-07-18 DIAGNOSIS — R262 Difficulty in walking, not elsewhere classified: Secondary | ICD-10-CM | POA: Insufficient documentation

## 2020-07-18 DIAGNOSIS — R2689 Other abnormalities of gait and mobility: Secondary | ICD-10-CM | POA: Insufficient documentation

## 2020-07-18 NOTE — Therapy (Signed)
[facility] [address] [place], [name], [address] Phone: [phone]   Fax:  [phone]  Physical Therapy Treatment  Patient Details  Name: Danielle Dean MRN: [id number] Date of Birth: 03-19-1929 Referring Provider (PT): Deveshwar   Encounter Date: 07/18/2020   PT End of Session - 07/18/20 1145    Visit Number 8    Date for PT Re-Evaluation 08/23/20    PT Start Time 1058    PT Stop Time 1140    PT Time Calculation (min) 42 min    Activity Tolerance Patient tolerated treatment well    Behavior During Therapy [facility] for tasks assessed/performed           Past Medical History:  Diagnosis Date  . Callus   . Cataract fragments in both eyes following surgery   . Corns and callosities   . Hx of hysterectomy   . Hypertension     Past Surgical History:  Procedure Laterality Date  . ABDOMINAL HYSTERECTOMY    . NASAL SEPTUM SURGERY    . ROTATOR CUFF REPAIR Right   . TOTAL SHOULDER ARTHROPLASTY      There were no vitals filed for this visit.   Subjective Assessment - 07/18/20 1057    Subjective Pt reports doing well today with tolerable levels of pain in knees    Currently in Pain? Yes    Pain Score 1     Pain Location Knee    Pain Orientation Right;Left                             [facility] Adult PT Treatment/Exercise - 07/18/20 0001      High Level Balance   High Level Balance Activities Side stepping;Backward walking;Tandem walking;Marching forwards;Marching backwards    High Level Balance Comments sidestepping on foam balance beam, fwd/bkwd on firm surface, marching fwd/bkwd firm surface, tandem fwd/bkwd walking firm surface; marching on airex, fwd step ups onto airex, sidestepping onto airex      Knee/Hip Exercises: Aerobic   Nustep L5 x 6 min    Other Aerobic UBE L1 2 min each      Knee/Hip Exercises: Standing   Other Standing Knee Exercises alt step taps 6" stairs 2x10 B    Other Standing  Knee Exercises stairs x2      Knee/Hip Exercises: Seated   Long Arc Quad Both;10 reps;2 sets    Long Arc Quad Limitations 2.5#    Clamshell with TheraBand Red   1x15   Marching Both;2 sets;10 reps    Marching Limitations 2.5#    Hamstring Curl Both;10 reps;1 set    Hamstring Limitations red; 1 set d/t reports of HS cramping    Sit to Sand 20 reps;without UE support   2x10 with 2# chest press                   PT Short Term Goals - 07/03/20 1141      PT SHORT TERM GOAL #1   Title independent with initial HEP    Time 2    Period Weeks    Status Achieved    Target Date 07/09/20             PT Long Term Goals - 06/25/20 1152      PT LONG TERM GOAL #1   Title tolerate walking >15 min with <5/10 pain    Time 6    Period Weeks  Status New    Target Date 08/06/20      PT LONG TERM GOAL #2   Title increase BLE hip abduction MMT to 4/5    Baseline 3+/5    Time 6    Period Weeks    Status New    Target Date 08/06/20      PT LONG TERM GOAL #3   Title report pain 50% less with cooking    Time 6    Period Weeks    Status New    Target Date 08/06/20                 Plan - 07/18/20 1146    Clinical Impression Statement Pt demos improved stability with high level balance activities. Still demos difficulties with balance on foam with occasional posterior LOB. Limited tolerance for standing ex's d/t knee pain. Continue to progress functional strengthening and balance.    PT Treatment/Interventions ADLs/Self Care Home Management;Electrical Stimulation;Moist Heat;Gait training;Neuromuscular re-education;Balance training;Therapeutic exercise;Therapeutic activities;Functional mobility training;Stair training;Patient/family education    PT Next Visit Plan functional LE strength/endurance    Consulted and Agree with Plan of Care Patient           Patient will benefit from skilled therapeutic intervention in order to improve the following deficits and  impairments:  Abnormal gait,Decreased activity tolerance,Decreased balance,Decreased mobility,Decreased strength,Difficulty walking,Decreased safety awareness,Impaired flexibility  Visit Diagnosis: Other abnormalities of gait and mobility  Acute bilateral low back pain without sciatica  Difficulty in walking, not elsewhere classified     Problem List Patient Active Problem List   Diagnosis Date Noted  . Porokeratosis 11/26/2018  . Corns and callosities 11/26/2018  . History of right shoulder replacement 10/07/2016  . Chronic pain of both knees 05/20/2016  . Osteoarthritis of both hands 05/20/2016  . Essential hypertension 05/20/2016  . Environmental allergies 05/20/2016  . Primary osteoarthritis of both knees 05/19/2016  . Callus of foot 11/24/2012  . Pain in joint, ankle and foot 09/15/2012   Lysle Rubens, PT, DPT [name] 07/18/2020, 11:49 AM  [facility] [address] [place], [name], [address] Phone: [phone]   Fax:  [phone]  Name: Danielle Dean MRN: [id number] Date of Birth: Oct 13, 1928

## 2020-07-25 ENCOUNTER — Other Ambulatory Visit: Payer: Self-pay

## 2020-07-25 ENCOUNTER — Ambulatory Visit: Payer: Medicare Other | Admitting: Physical Therapy

## 2020-07-25 ENCOUNTER — Encounter: Payer: Self-pay | Admitting: Physical Therapy

## 2020-07-25 DIAGNOSIS — R2689 Other abnormalities of gait and mobility: Secondary | ICD-10-CM

## 2020-07-25 DIAGNOSIS — M545 Low back pain, unspecified: Secondary | ICD-10-CM

## 2020-07-25 DIAGNOSIS — R262 Difficulty in walking, not elsewhere classified: Secondary | ICD-10-CM

## 2020-07-25 NOTE — Therapy (Signed)
[facility] [address] [place], [name], [address] Phone: [phone]   Fax:  [phone]  Physical Therapy Treatment  Patient Details  Name: Danielle Dean MRN: [id number] Date of Birth: 22-Jul-1928 Referring Provider (PT): Deveshwar   Encounter Date: 07/25/2020   PT End of Session - 07/25/20 1142    Visit Number 9    Date for PT Re-Evaluation 08/23/20    PT Start Time 1056    PT Stop Time 1141    PT Time Calculation (min) 45 min    Activity Tolerance Patient tolerated treatment well    Behavior During Therapy [facility] for tasks assessed/performed           Past Medical History:  Diagnosis Date  . Callus   . Cataract fragments in both eyes following surgery   . Corns and callosities   . Hx of hysterectomy   . Hypertension     Past Surgical History:  Procedure Laterality Date  . ABDOMINAL HYSTERECTOMY    . NASAL SEPTUM SURGERY    . ROTATOR CUFF REPAIR Right   . TOTAL SHOULDER ARTHROPLASTY      There were no vitals filed for this visit.   Subjective Assessment - 07/25/20 1104    Subjective Pt reports doing well today with tolerable levels of pain in knees    Currently in Pain? Yes    Pain Score 1     Pain Location Knee    Pain Orientation Right;Left                             [facility] Adult PT Treatment/Exercise - 07/25/20 0001      High Level Balance   High Level Balance Activities Side stepping;Backward walking;Marching forwards;Marching backwards    High Level Balance Comments sidestepping on foam balance beam      Knee/Hip Exercises: Aerobic   Nustep L5 x 6 min    Other Aerobic UBE L1 2 min each      Knee/Hip Exercises: Machines for Strengthening   Other Machine rows and lats 10# 2x10      Knee/Hip Exercises: Seated   Long Arc Quad Both;10 reps;2 sets    Long Arc Quad Limitations 2.5#    [employer] x15 3 sec hold    Other Seated Knee/Hip Exercises iso abs x15 3 sec hold     Marching Both;2 sets;10 reps    Marching Limitations 2.5#    Hamstring Curl Both;10 reps;1 set    Hamstring Limitations red    Sit to Sand 20 reps;without UE support   with yellow ball chest switching to 2# dumbbell for 2nd set                   PT Short Term Goals - 07/03/20 1141      PT SHORT TERM GOAL #1   Title independent with initial HEP    Time 2    Period Weeks    Status Achieved    Target Date 07/09/20             PT Long Term Goals - 07/25/20 1142      PT LONG TERM GOAL #1   Title tolerate walking >15 min with <5/10 pain    Time 6    Period Weeks    Status On-going      PT LONG TERM GOAL #2   Title increase BLE hip abduction MMT to  4/5    Baseline 3+/5    Time 6    Period Weeks    Status On-going      PT LONG TERM GOAL #3   Title report pain 50% less with cooking    Time 6    Period Weeks    Status On-going                 Plan - 07/25/20 1143    Clinical Impression Statement Pt demos improved balance with side stepping on foam, occasional posterior LOB. Working on self correction of L lateral shift in mirror, cuing to decrease compensations with postural corrections. Continues to be limited in tolerance for standing ex's d/t knee pain. Progress functional strengthening and balance working towards discharge with updated HEP at last scheduled rx.    PT Treatment/Interventions ADLs/Self Care Home Management;Electrical Stimulation;Moist Heat;Gait training;Neuromuscular re-education;Balance training;Therapeutic exercise;Therapeutic activities;Functional mobility training;Stair training;Patient/family education    PT Next Visit Plan functional LE strength/endurance    Consulted and Agree with Plan of Care Patient           Patient will benefit from skilled therapeutic intervention in order to improve the following deficits and impairments:  Abnormal gait,Decreased activity tolerance,Decreased balance,Decreased mobility,Decreased  strength,Difficulty walking,Decreased safety awareness,Impaired flexibility  Visit Diagnosis: Other abnormalities of gait and mobility  Acute bilateral low back pain without sciatica  Difficulty in walking, not elsewhere classified     Problem List Patient Active Problem List   Diagnosis Date Noted  . Porokeratosis 11/26/2018  . Corns and callosities 11/26/2018  . History of right shoulder replacement 10/07/2016  . Chronic pain of both knees 05/20/2016  . Osteoarthritis of both hands 05/20/2016  . Essential hypertension 05/20/2016  . Environmental allergies 05/20/2016  . Primary osteoarthritis of both knees 05/19/2016  . Callus of foot 11/24/2012  . Pain in joint, ankle and foot 09/15/2012   Lysle Rubens, PT, DPT Maryanna Shape Everest Hacking 07/25/2020, 11:45 AM  [facility] [address] [place], [name], [address] Phone: [phone]   Fax:  [phone]  Name: Danielle Dean MRN: [id number] Date of Birth: 06/01/1929

## 2020-07-27 ENCOUNTER — Other Ambulatory Visit: Payer: Self-pay

## 2020-07-27 ENCOUNTER — Encounter: Payer: Self-pay | Admitting: Physical Therapy

## 2020-07-27 ENCOUNTER — Ambulatory Visit: Payer: Medicare Other | Admitting: Physical Therapy

## 2020-07-27 DIAGNOSIS — M545 Low back pain, unspecified: Secondary | ICD-10-CM

## 2020-07-27 DIAGNOSIS — R2689 Other abnormalities of gait and mobility: Secondary | ICD-10-CM | POA: Diagnosis not present

## 2020-07-27 DIAGNOSIS — R262 Difficulty in walking, not elsewhere classified: Secondary | ICD-10-CM

## 2020-07-27 NOTE — Therapy (Signed)
[place]. [place], [place], [address] Phone: [phone]   Fax:  [phone]  Physical Therapy Treatment Progress Note Reporting Period 06/25/2020 to 07/27/2020  See note below for Objective Data and Assessment of Progress/Goals.      Patient Details  Name: Danielle Dean MRN: [id number] Date of Birth: 08/28/1928 Referring Provider (PT): Deveshwar   Encounter Date: 07/27/2020   PT End of Session - 07/27/20 1142    Visit Number 10    Date for PT Re-Evaluation 08/23/20    PT Start Time 1058    PT Stop Time 1141    PT Time Calculation (min) 43 min    Activity Tolerance Patient tolerated treatment well    Behavior During Therapy [facility] for tasks assessed/performed           Past Medical History:  Diagnosis Date  . Callus   . Cataract fragments in both eyes following surgery   . Corns and callosities   . Hx of hysterectomy   . Hypertension     Past Surgical History:  Procedure Laterality Date  . ABDOMINAL HYSTERECTOMY    . NASAL SEPTUM SURGERY    . ROTATOR CUFF REPAIR Right   . TOTAL SHOULDER ARTHROPLASTY      There were no vitals filed for this visit.   Subjective Assessment - 07/27/20 1105    Subjective Pt reports doing well today with tolerable levels of pain in knees    Currently in Pain? Yes    Pain Score 1     Pain Location Knee    Pain Orientation Right;Left                             [facility] Adult PT Treatment/Exercise - 07/27/20 0001      High Level Balance   High Level Balance Activities Side stepping;Backward walking;Marching forwards;Marching backwards    High Level Balance Comments sidestepping on foam balance beam      Knee/Hip Exercises: Aerobic   Nustep L5 x 6 min    Other Aerobic UBE L1 2 min each      Knee/Hip Exercises: Machines for Strengthening   Other Machine rows and lats 10# 2x10      Knee/Hip Exercises: Standing   Heel Raises Both;1 set;10  reps    Heel Raises Limitations 2.5#    Hip Abduction Both;10 reps;1 set    Abduction Limitations 2.5#    Hip Extension Both;1 set    Extension Limitations 2.5#    Other Standing Knee Exercises stairs x3 SBA      Knee/Hip Exercises: Seated   Long Arc Quad Both;10 reps;2 sets    Long Arc Quad Limitations 2.5#    [facility] [facility] x15 3 sec hold    Marching Both;2 sets;10 reps    Marching Limitations 2.5#    Hamstring Curl Both;2 sets;10 reps    Hamstring Limitations red    Sit to Sand without UE support;15 reps   with 2# chest press 1x10, 1x5                   PT Short Term Goals - 07/03/20 1141      PT SHORT TERM GOAL #1   Title independent with initial HEP    Time 2    Period Weeks    Status Achieved    Target Date 07/09/20  PT Long Term Goals - 07/27/20 1142      PT LONG TERM GOAL #1   Title tolerate walking >15 min with <5/10 pain    Time 6    Period Weeks    Status On-going      PT LONG TERM GOAL #2   Title increase BLE hip abduction MMT to 4/5    Baseline 3+/5    Time 6    Period Weeks    Status On-going      PT LONG TERM GOAL #3   Title report pain 50% less with cooking    Time 6    Period Weeks    Status Partially Met                 Plan - 07/27/20 1142    Clinical Impression Statement Pt making minimal progress toward LTG; working towards increasing standing tolerance and functional strength in LEs. Able to tolerate >10 minutes of standing balance ex's before needing a rest break. SBA-CGA for balance ex's this rx with occasional posterior LOB with sidestepping on foam. Working towards discharge with updated HEP at last scheduled rx.    PT Treatment/Interventions ADLs/Self Care Home Management;Electrical Stimulation;Moist Heat;Gait training;Neuromuscular re-education;Balance training;Therapeutic exercise;Therapeutic activities;Functional mobility training;Stair training;Patient/family education    PT Next Visit Plan functional  LE strength/endurance    Consulted and Agree with Plan of Care Patient           Patient will benefit from skilled therapeutic intervention in order to improve the following deficits and impairments:  Abnormal gait,Decreased activity tolerance,Decreased balance,Decreased mobility,Decreased strength,Difficulty walking,Decreased safety awareness,Impaired flexibility  Visit Diagnosis: Other abnormalities of gait and mobility  Acute bilateral low back pain without sciatica  Difficulty in walking, not elsewhere classified     Problem List Patient Active Problem List   Diagnosis Date Noted  . Porokeratosis 11/26/2018  . Corns and callosities 11/26/2018  . History of right shoulder replacement 10/07/2016  . Chronic pain of both knees 05/20/2016  . Osteoarthritis of both hands 05/20/2016  . Essential hypertension 05/20/2016  . Environmental allergies 05/20/2016  . Primary osteoarthritis of both knees 05/19/2016  . Callus of foot 11/24/2012  . Pain in joint, ankle and foot 09/15/2012   Amador Cunas, PT, DPT Donald Prose Rosamaria Donn 07/27/2020, 11:44 AM  [place]. [place], [place], [address] Phone: [phone]   Fax:  [phone]  Name: Danielle Dean MRN: [id number] Date of Birth: 1928/10/12

## 2020-08-01 ENCOUNTER — Ambulatory Visit: Payer: Medicare Other | Admitting: Physical Therapy

## 2020-08-01 ENCOUNTER — Other Ambulatory Visit: Payer: Self-pay

## 2020-08-01 ENCOUNTER — Encounter: Payer: Self-pay | Admitting: Physical Therapy

## 2020-08-01 DIAGNOSIS — R2689 Other abnormalities of gait and mobility: Secondary | ICD-10-CM | POA: Diagnosis not present

## 2020-08-01 DIAGNOSIS — M545 Low back pain, unspecified: Secondary | ICD-10-CM

## 2020-08-01 DIAGNOSIS — R262 Difficulty in walking, not elsewhere classified: Secondary | ICD-10-CM

## 2020-08-01 NOTE — Therapy (Signed)
[place]. [place], [place], [address] Phone: [phone]   Fax:  [phone]  Physical Therapy Treatment  Patient Details  Name: Danielle Dean MRN: [id number] Date of Birth: 1929/02/15 Referring Provider (PT): Danielle Dean   Encounter Date: 08/01/2020   PT End of Session - 08/01/20 1149    Visit Number 11    Date for PT Re-Evaluation 08/23/20    PT Start Time 1101    PT Stop Time 1144    PT Time Calculation (min) 43 min    Activity Tolerance Patient tolerated treatment well    Behavior During Therapy Danielle Dean for tasks assessed/performed           Past Medical History:  Diagnosis Date  . Callus   . Cataract fragments in both eyes following surgery   . Corns and callosities   . Hx of hysterectomy   . Hypertension     Past Surgical History:  Procedure Laterality Date  . ABDOMINAL HYSTERECTOMY    . NASAL SEPTUM SURGERY    . ROTATOR CUFF REPAIR Right   . TOTAL SHOULDER ARTHROPLASTY      There were no vitals filed for this visit.   Subjective Assessment - 08/01/20 1059    Subjective Pt reports doing well today with tolerable levels of pain in knees    Currently in Pain? Yes    Pain Score 1     Pain Location Knee    Pain Orientation Right;Left                             [facility] Adult PT Treatment/Exercise - 08/01/20 0001      High Level Balance   High Level Balance Comments fwd and lat stepping onto airex CGA      Knee/Hip Exercises: Aerobic   Nustep L5 x 6 min    Other Aerobic UBE L1 2 min each      Knee/Hip Exercises: Machines for Strengthening   Other Machine rows and lats 10# 2x10      Knee/Hip Exercises: Standing   Heel Raises Both;1 set;10 reps    Heel Raises Limitations 2.5#    Hip Abduction Both;10 reps;1 set    Abduction Limitations 2.5#    Hip Extension Both;1 set    Extension Limitations 2.5#    Other Standing Knee Exercises rows/ext with red TB 1x15       Knee/Hip Exercises: Seated   Long Arc Quad Both;10 reps;2 sets    Long Arc Quad Limitations 2.5#    [facility] x15 3 sec hold    Other Seated Knee/Hip Exercises iso abs x15 3 sec hold    Marching Both;2 sets;10 reps    Marching Limitations 2.5#    Sit to [employer] 2 sets;10 reps;without UE support   with 2# chest press                   PT Short Term Goals - 07/03/20 1141      PT SHORT TERM GOAL #1   Title independent with initial HEP    Time 2    Period Weeks    Status Achieved    Target Date 07/09/20             PT Long Term Goals - 07/27/20 1142      PT LONG TERM GOAL #1   Title tolerate walking >15 min with <5/10 pain  Time 6    Period Weeks    Status On-going      PT LONG TERM GOAL #2   Title increase BLE hip abduction MMT to 4/5    Baseline 3+/5    Time 6    Period Weeks    Status On-going      PT LONG TERM GOAL #3   Title report pain 50% less with cooking    Time 6    Period Weeks    Status Partially Met                 Plan - 08/01/20 1149    Clinical Impression Statement Pt reporting increased soreness in B knees today; needed more frequent rest breaks following standing ex's. Cues for form with standing rows/extension. CGA for high level balance on airex with occasional LOB. Discussed potential for discharge soon with updated HEP; pt feels she needs further sessions to address LE weakness and knee pain. Discussed working towards discharge with updated HEP with pt VU.    PT Treatment/Interventions ADLs/Self Care Home Management;Electrical Stimulation;Moist Heat;Gait training;Neuromuscular re-education;Balance training;Therapeutic exercise;Therapeutic activities;Functional mobility training;Stair training;Patient/family education    PT Next Visit Plan functional LE strength/endurance    Consulted and Agree with Plan of Care Patient           Patient will benefit from skilled therapeutic intervention in order to improve the following  deficits and impairments:  Abnormal gait,Decreased activity tolerance,Decreased balance,Decreased mobility,Decreased strength,Difficulty walking,Decreased safety awareness,Impaired flexibility  Visit Diagnosis: Other abnormalities of gait and mobility  Acute bilateral low back pain without sciatica  Difficulty in walking, not elsewhere classified     Problem List Patient Active Problem List   Diagnosis Date Noted  . Porokeratosis 11/26/2018  . Corns and callosities 11/26/2018  . History of right shoulder replacement 10/07/2016  . Chronic pain of both knees 05/20/2016  . Osteoarthritis of both hands 05/20/2016  . Essential hypertension 05/20/2016  . Environmental allergies 05/20/2016  . Primary osteoarthritis of both knees 05/19/2016  . Callus of foot 11/24/2012  . Pain in joint, ankle and foot 09/15/2012   Danielle Dean, PT, DPT Danielle Dean Danielle Dean 08/01/2020, 11:52 AM  [place]. [place], [place], [address] Phone: [phone]   Fax:  [phone]  Name: Danielle Dean MRN: [id number] Date of Birth: 1928-08-28

## 2020-08-03 ENCOUNTER — Encounter: Payer: Self-pay | Admitting: Physical Therapy

## 2020-08-03 ENCOUNTER — Ambulatory Visit: Payer: Medicare Other | Admitting: Physical Therapy

## 2020-08-03 ENCOUNTER — Other Ambulatory Visit: Payer: Self-pay

## 2020-08-03 DIAGNOSIS — M545 Low back pain, unspecified: Secondary | ICD-10-CM

## 2020-08-03 DIAGNOSIS — R2689 Other abnormalities of gait and mobility: Secondary | ICD-10-CM | POA: Diagnosis not present

## 2020-08-03 DIAGNOSIS — R262 Difficulty in walking, not elsewhere classified: Secondary | ICD-10-CM

## 2020-08-03 NOTE — Therapy (Signed)
[place]. [place], [place], [address] Phone: [phone]   Fax:  [phone]  Physical Therapy Treatment  Patient Details  Name: Danielle Dean MRN: [id number] Date of Birth: Dec 12, 1928 Referring Provider (PT): Deveshwar   Encounter Date: 08/03/2020   PT End of Session - 08/03/20 1148    Visit Number 12    Date for PT Re-Evaluation 08/23/20    PT Start Time 1100    PT Stop Time 1145    PT Time Calculation (min) 45 min    Activity Tolerance Patient tolerated treatment well    Behavior During Therapy [facility] for tasks assessed/performed           Past Medical History:  Diagnosis Date  . Callus   . Cataract fragments in both eyes following surgery   . Corns and callosities   . Hx of hysterectomy   . Hypertension     Past Surgical History:  Procedure Laterality Date  . ABDOMINAL HYSTERECTOMY    . NASAL SEPTUM SURGERY    . ROTATOR CUFF REPAIR Right   . TOTAL SHOULDER ARTHROPLASTY      There were no vitals filed for this visit.   Subjective Assessment - 08/03/20 1103    Subjective Pt reports doing well today with tolerable levels of pain in knees    Currently in Pain? Yes    Pain Score --   unable to rate but reports increased pain in R knee today   Pain Location Knee    Pain Orientation Right                             [facility] Adult PT Treatment/Exercise - 08/03/20 0001      Ambulation/Gait   Gait Comments stairs x4 ascend/descend step over step up and step to down      High Level Balance   High Level Balance Comments sidestepping on foam balance beam      Knee/Hip Exercises: Aerobic   Nustep L5 x 6 min    Other Aerobic UBE L1 2 min each      Knee/Hip Exercises: Machines for Strengthening   Other Machine rows and lats 10# 2x10      Knee/Hip Exercises: Seated   Long Arc Quad Both;10 reps;2 sets    Long Arc Quad Limitations 2.5#    [employer] x15 3 sec hold    Other  Seated Knee/Hip Exercises iso abs x15 3 sec hold    Marching Both;2 sets;10 reps    Marching Limitations 2.5#    Hamstring Curl Both;2 sets;10 reps    Hamstring Limitations red    Sit to Sand 2 sets;10 reps;without UE support   with 2# chest press     Knee/Hip Exercises: Supine   Other Supine Knee/Hip Exercises supine dktc, trunk rotation, small bridges on pball                    PT Short Term Goals - 07/03/20 1141      PT SHORT TERM GOAL #1   Title independent with initial HEP    Time 2    Period Weeks    Status Achieved    Target Date 07/09/20             PT Long Term Goals - 07/27/20 1142      PT LONG TERM GOAL #1   Title tolerate walking >15 min with <  5/10 pain    Time 6    Period Weeks    Status On-going      PT LONG TERM GOAL #2   Title increase BLE hip abduction MMT to 4/5    Baseline 3+/5    Time 6    Period Weeks    Status On-going      PT LONG TERM GOAL #3   Title report pain 50% less with cooking    Time 6    Period Weeks    Status Partially Met                 Plan - 08/03/20 1149    Clinical Impression Statement Pt reporting increased soreness in R knee today; unable to tolerate many standing ex's this rx. Stairs with HR step over step pattern ascending and step to pattern descending. CGA for high level balance with sidestepping on foam balance beam, occasional posterior LOB.    PT Treatment/Interventions ADLs/Self Care Home Management;Electrical Stimulation;Moist Heat;Gait training;Neuromuscular re-education;Balance training;Therapeutic exercise;Therapeutic activities;Functional mobility training;Stair training;Patient/family education    PT Next Visit Plan functional LE strength/endurance    Consulted and Agree with Plan of Care Patient           Patient will benefit from skilled therapeutic intervention in order to improve the following deficits and impairments:  Abnormal gait,Decreased activity tolerance,Decreased  balance,Decreased mobility,Decreased strength,Difficulty walking,Decreased safety awareness,Impaired flexibility  Visit Diagnosis: Other abnormalities of gait and mobility  Acute bilateral low back pain without sciatica  Difficulty in walking, not elsewhere classified     Problem List Patient Active Problem List   Diagnosis Date Noted  . Porokeratosis 11/26/2018  . Corns and callosities 11/26/2018  . History of right shoulder replacement 10/07/2016  . Chronic pain of both knees 05/20/2016  . Osteoarthritis of both hands 05/20/2016  . Essential hypertension 05/20/2016  . Environmental allergies 05/20/2016  . Primary osteoarthritis of both knees 05/19/2016  . Callus of foot 11/24/2012  . Pain in joint, ankle and foot 09/15/2012   Amador Cunas, PT, DPT [name] 08/03/2020, 11:50 AM  [place]. [place], [place], [address] Phone: [phone]   Fax:  [phone]  Name: Danielle Dean MRN: [id number] Date of Birth: Feb 19, 1929

## 2020-08-08 ENCOUNTER — Ambulatory Visit: Payer: Medicare Other | Admitting: Physical Therapy

## 2020-08-08 ENCOUNTER — Encounter: Payer: Self-pay | Admitting: Physical Therapy

## 2020-08-08 ENCOUNTER — Other Ambulatory Visit: Payer: Self-pay

## 2020-08-08 DIAGNOSIS — R2689 Other abnormalities of gait and mobility: Secondary | ICD-10-CM

## 2020-08-08 DIAGNOSIS — M545 Low back pain, unspecified: Secondary | ICD-10-CM

## 2020-08-08 DIAGNOSIS — R262 Difficulty in walking, not elsewhere classified: Secondary | ICD-10-CM

## 2020-08-08 NOTE — Therapy (Signed)
[place]. [place], [place], [address] Phone: [phone]   Fax:  [phone]  Physical Therapy Treatment  Patient Details  Name: Danielle Dean MRN: [id number] Date of Birth: 1928-11-29 Referring Provider (PT): Deveshwar   Encounter Date: 08/08/2020   PT End of Session - 08/08/20 1011    Visit Number 13    Date for PT Re-Evaluation 08/23/20    PT Start Time 0928    PT Stop Time 1013    PT Time Calculation (min) 45 min    Activity Tolerance Patient tolerated treatment well    Behavior During Therapy [facility] for tasks assessed/performed           Past Medical History:  Diagnosis Date  . Callus   . Cataract fragments in both eyes following surgery   . Corns and callosities   . Hx of hysterectomy   . Hypertension     Past Surgical History:  Procedure Laterality Date  . ABDOMINAL HYSTERECTOMY    . NASAL SEPTUM SURGERY    . ROTATOR CUFF REPAIR Right   . TOTAL SHOULDER ARTHROPLASTY      There were no vitals filed for this visit.   Subjective Assessment - 08/08/20 0938    Subjective Pt reports doing well today; states she has not been up long enough to rate knee pain    Currently in Pain? No/denies    Pain Score 0-No pain    Pain Location Knee    Pain Orientation Right;Left                             [facility] Adult PT Treatment/Exercise - 08/08/20 0001      High Level Balance   High Level Balance Comments marching on airex, lateral step ups/downs onto airex      Knee/Hip Exercises: Aerobic   Nustep L5 x 6 min    Other Aerobic UBE L1 2 min each      Knee/Hip Exercises: Machines for Strengthening   Other Machine rows and lats 10# 2x10      Knee/Hip Exercises: Seated   Long Arc Quad Both;10 reps;2 sets    Long Arc Quad Limitations 2.5#    [facility] 2x15 3 sec hold    Other Seated Knee/Hip Exercises iso abs x15 3 sec hold    Marching Both;2 sets;10 reps    Marching  Limitations 2.5#    Sit to [employer] 2 sets;10 reps;without UE support   2# chest press     Knee/Hip Exercises: Supine   Other Supine Knee/Hip Exercises supine dktc, trunk rotation, small bridges on pball                    PT Short Term Goals - 07/03/20 1141      PT SHORT TERM GOAL #1   Title independent with initial HEP    Time 2    Period Weeks    Status Achieved    Target Date 07/09/20             PT Long Term Goals - 07/27/20 1142      PT LONG TERM GOAL #1   Title tolerate walking >15 min with <5/10 pain    Time 6    Period Weeks    Status On-going      PT LONG TERM GOAL #2   Title increase BLE hip abduction MMT to 4/5  Baseline 3+/5    Time 6    Period Weeks    Status On-going      PT LONG TERM GOAL #3   Title report pain 50% less with cooking    Time 6    Period Weeks    Status Partially Met                 Plan - 08/08/20 1011    Clinical Impression Statement Pt continues to report increased R knee pain and soreness with extensive standing ex's. Limited in number of standing ex's we can complete before LEs start to fatigue. CGA for high level balance ex's this rx. Continue to progress functional strengthening ex's.    PT Treatment/Interventions ADLs/Self Care Home Management;Electrical Stimulation;Moist Heat;Gait training;Neuromuscular re-education;Balance training;Therapeutic exercise;Therapeutic activities;Functional mobility training;Stair training;Patient/family education    PT Next Visit Plan functional LE strength/endurance    Consulted and Agree with Plan of Care Patient           Patient will benefit from skilled therapeutic intervention in order to improve the following deficits and impairments:  Abnormal gait,Decreased activity tolerance,Decreased balance,Decreased mobility,Decreased strength,Difficulty walking,Decreased safety awareness,Impaired flexibility  Visit Diagnosis: Other abnormalities of gait and mobility  Acute  bilateral low back pain without sciatica  Difficulty in walking, not elsewhere classified     Problem List Patient Active Problem List   Diagnosis Date Noted  . Porokeratosis 11/26/2018  . Corns and callosities 11/26/2018  . History of right shoulder replacement 10/07/2016  . Chronic pain of both knees 05/20/2016  . Osteoarthritis of both hands 05/20/2016  . Essential hypertension 05/20/2016  . Environmental allergies 05/20/2016  . Primary osteoarthritis of both knees 05/19/2016  . Callus of foot 11/24/2012  . Pain in joint, ankle and foot 09/15/2012   Amador Cunas, PT, DPT [facility] Pallas Wahlert 08/08/2020, 10:15 AM  [place]. [place], [place], [address] Phone: [phone]   Fax:  [phone]  Name: Danielle Dean MRN: [id number] Date of Birth: 1928-12-19

## 2020-08-15 ENCOUNTER — Encounter: Payer: Self-pay | Admitting: Physical Therapy

## 2020-08-15 ENCOUNTER — Ambulatory Visit: Payer: Medicare Other | Attending: Rheumatology | Admitting: Physical Therapy

## 2020-08-15 ENCOUNTER — Other Ambulatory Visit: Payer: Self-pay

## 2020-08-15 DIAGNOSIS — R262 Difficulty in walking, not elsewhere classified: Secondary | ICD-10-CM | POA: Insufficient documentation

## 2020-08-15 DIAGNOSIS — R2689 Other abnormalities of gait and mobility: Secondary | ICD-10-CM | POA: Diagnosis present

## 2020-08-15 DIAGNOSIS — M545 Low back pain, unspecified: Secondary | ICD-10-CM | POA: Diagnosis present

## 2020-08-15 NOTE — Therapy (Signed)
[place]. [place], [place], [address] Phone: [phone]   Fax:  [phone]  Physical Therapy Treatment  Patient Details  Name: GLENDALE WHERRY MRN: [id number] Date of Birth: 03/18/1929 Referring Provider (PT): Deveshwar   Encounter Date: 08/15/2020   PT End of Session - 08/15/20 1147    Visit Number 14    Date for PT Re-Evaluation 08/23/20    PT Start Time 1101    PT Stop Time 1145    PT Time Calculation (min) 44 min    Activity Tolerance Patient tolerated treatment well    Behavior During Therapy [facility] for tasks assessed/performed           Past Medical History:  Diagnosis Date  . Callus   . Cataract fragments in both eyes following surgery   . Corns and callosities   . Hx of hysterectomy   . Hypertension     Past Surgical History:  Procedure Laterality Date  . ABDOMINAL HYSTERECTOMY    . NASAL SEPTUM SURGERY    . ROTATOR CUFF REPAIR Right   . TOTAL SHOULDER ARTHROPLASTY      There were no vitals filed for this visit.   Subjective Assessment - 08/15/20 1111    Subjective Pt reports doing well today; daughter is rheumatologist and is visiting from [place]. Planning to give injection to R knee, so may not be here on Friday.    Currently in Pain? No/denies    Pain Score 0-No pain    Pain Location Knee                             [facility] Adult PT Treatment/Exercise - 08/15/20 0001      Ambulation/Gait   Gait Comments gait training with rollator x100 ft around clinic      High Level Balance   High Level Balance Comments marching on airex, lateral step ups/downs onto airex      Knee/Hip Exercises: Aerobic   Nustep L5 x 6 min    Other Aerobic UBE L1 2 min each      Knee/Hip Exercises: Machines for Strengthening   Other Machine rows and lats 10# 2x15      Knee/Hip Exercises: Standing   Other Standing Knee Exercises alt step taps no HR 6" stair 1x10 B                     PT Short Term Goals - 07/03/20 1141      PT SHORT TERM GOAL #1   Title independent with initial HEP    Time 2    Period Weeks    Status Achieved    Target Date 07/09/20             PT Long Term Goals - 07/27/20 1142      PT LONG TERM GOAL #1   Title tolerate walking >15 min with <5/10 pain    Time 6    Period Weeks    Status On-going      PT LONG TERM GOAL #2   Title increase BLE hip abduction MMT to 4/5    Baseline 3+/5    Time 6    Period Weeks    Status On-going      PT LONG TERM GOAL #3   Title report pain 50% less with cooking    Time 6    Period Weeks    Status  Partially Met                 Plan - 08/15/20 1147    Clinical Impression Statement Pt plans to receive injection to R knee so will return to clinic next week. Did well with ex's today; CGA for high level balance ex's. Occasional posterior LOB, some difficulty with sequencing LEs with step ups. Pt daughter brought new rollator into clinic. Education regarding locking brakes, adjustment of height, and staying close to rollator. Will need reinforcement next rx. Pt and daughter plan for her to transition back to independent gym program following discharge.    PT Treatment/Interventions ADLs/Self Care Home Management;Electrical Stimulation;Moist Heat;Gait training;Neuromuscular re-education;Balance training;Therapeutic exercise;Therapeutic activities;Functional mobility training;Stair training;Patient/family education    PT Next Visit Plan functional LE strength/endurance; pt plans to transition to independent gym program at discharge    Consulted and Agree with Plan of Care Patient;Family member/caregiver    Family Member Consulted daughter           Patient will benefit from skilled therapeutic intervention in order to improve the following deficits and impairments:  Abnormal gait,Decreased activity tolerance,Decreased balance,Decreased mobility,Decreased strength,Difficulty  walking,Decreased safety awareness,Impaired flexibility  Visit Diagnosis: Other abnormalities of gait and mobility  Acute bilateral low back pain without sciatica  Difficulty in walking, not elsewhere classified     Problem List Patient Active Problem List   Diagnosis Date Noted  . Porokeratosis 11/26/2018  . Corns and callosities 11/26/2018  . History of right shoulder replacement 10/07/2016  . Chronic pain of both knees 05/20/2016  . Osteoarthritis of both hands 05/20/2016  . Essential hypertension 05/20/2016  . Environmental allergies 05/20/2016  . Primary osteoarthritis of both knees 05/19/2016  . Callus of foot 11/24/2012  . Pain in joint, ankle and foot 09/15/2012   Amador Cunas, PT, DPT [facility] Prose Teriah Muela 08/15/2020, 11:53 AM  [place]. [place], [place], [address] Phone: [phone]   Fax:  [phone]  Name: AILY TZENG MRN: [id number] Date of Birth: July 23, 1928

## 2020-08-17 ENCOUNTER — Ambulatory Visit: Payer: Medicare Other | Admitting: Physical Therapy

## 2020-08-22 ENCOUNTER — Encounter: Payer: Self-pay | Admitting: Podiatry

## 2020-08-22 ENCOUNTER — Other Ambulatory Visit: Payer: Self-pay

## 2020-08-22 ENCOUNTER — Ambulatory Visit (INDEPENDENT_AMBULATORY_CARE_PROVIDER_SITE_OTHER): Payer: Medicare Other | Admitting: Podiatry

## 2020-08-22 ENCOUNTER — Ambulatory Visit: Payer: Medicare Other | Admitting: Physical Therapy

## 2020-08-22 DIAGNOSIS — Q828 Other specified congenital malformations of skin: Secondary | ICD-10-CM

## 2020-08-22 NOTE — Progress Notes (Signed)
Subjective:   Patient ID: Danielle Dean, female   DOB: 87 y.o.   MRN: [id number]   HPI Patient presents with painful lesions left foot that are making it hard to walk   ROS      Objective:  Physical Exam  Neurovascular status intact with keratotic lesion x2 left painful     Assessment:  Chronic lesion formation left     Plan:  Debridement of lesions slight iatrogenic bleeding applied dressing to some irritated tissue reappoint to recheck and applied Neosporin with instructions on watching it and soak

## 2020-08-24 ENCOUNTER — Ambulatory Visit: Payer: Medicare Other | Admitting: Physical Therapy

## 2020-08-24 ENCOUNTER — Other Ambulatory Visit: Payer: Self-pay

## 2020-08-24 DIAGNOSIS — R2689 Other abnormalities of gait and mobility: Secondary | ICD-10-CM

## 2020-08-24 DIAGNOSIS — R262 Difficulty in walking, not elsewhere classified: Secondary | ICD-10-CM

## 2020-08-24 NOTE — Therapy (Signed)
[place]. [place], [place], [address] Phone: [phone]   Fax:  [phone]  Physical Therapy Treatment  Patient Details  Name: Danielle Dean MRN: [id number] Date of Birth: 1928/06/19 Referring Provider (PT): Deveshwar   Encounter Date: 08/24/2020   PT End of Session - 08/24/20 1141    Visit Number 15    Date for PT Re-Evaluation 08/23/20    PT Start Time 1100    PT Stop Time 1144    PT Time Calculation (min) 44 min           Past Medical History:  Diagnosis Date  . Callus   . Cataract fragments in both eyes following surgery   . Corns and callosities   . Hx of hysterectomy   . Hypertension     Past Surgical History:  Procedure Laterality Date  . ABDOMINAL HYSTERECTOMY    . NASAL SEPTUM SURGERY    . ROTATOR CUFF REPAIR Right   . TOTAL SHOULDER ARTHROPLASTY      There were no vitals filed for this visit.   Subjective Assessment - 08/24/20 1107    Subjective sorry I had to cancel 2 appts got injection in my knee( that did not help) and then had an MD appt    Currently in Pain? Yes    Pain Score 4     Pain Location Knee    Pain Orientation Right;Left                             [facility] Adult PT Treatment/Exercise - 08/24/20 0001      High Level Balance   High Level Balance Activities Tandem walking;Side stepping   on foam beam   High Level Balance Comments marching on airex, lateral step ups/downs onto airex      Knee/Hip Exercises: Aerobic   Nustep L5 x 6 min    Other Aerobic UBE L2 2 min fwd/2 min backward      Knee/Hip Exercises: Machines for Strengthening   Cybex Knee Flexion 10lb 2x10    Other Machine rows and lats 10# 2x15   trunk ext black tband 2 sets 10     Knee/Hip Exercises: Standing   Hip Flexion Stengthening;Both;10 reps;Knee bent   2.5 #     Knee/Hip Exercises: Seated   Long Arc Quad Both;10 reps;2 sets    Long Arc Quad Limitations 2.5#    Ball  Squeeze 15x 3 sec hold    Clamshell with Thera[employer] Green   15x   Marching Both;2 sets;10 reps    Marching Limitations 2.5#                    PT Short Term Goals - 07/03/20 1141      PT SHORT TERM GOAL #1   Title independent with initial HEP    Time 2    Period Weeks    Status Achieved    Target Date 07/09/20             PT Long Term Goals - 08/24/20 1109      PT LONG TERM GOAL #1   Title tolerate walking >15 min with <5/10 pain    Status On-going      PT LONG TERM GOAL #2   Title increase BLE hip abduction MMT to 4/5    Status Partially Met      PT LONG TERM GOAL #3  Title report pain 50% less with cooking    Status Partially Met                 Plan - 08/24/20 1110    Clinical Impression Statement discussed with pt transition to independant ex program and started to instruct- pt states " if life circumsatnces with my husband allow I will try yo come when PT is over" slow progress towards goals, pain is variable and explained to pt need to keep muscles strong to avoid increased joint strain and she VU- attempted ot increase wt on machine sbut "too heavy-per pt"    PT Treatment/Interventions ADLs/Self Care Home Management;Electrical Stimulation;Moist Heat;Gait training;Neuromuscular re-education;Balance training;Therapeutic exercise;Therapeutic activities;Functional mobility training;Stair training;Patient/family education    PT Next Visit Plan functional LE strength/endurance; pt plans to transition to independent gym program at discharge- pt will need renewal next session if not ready for transition to gym program           Patient will benefit from skilled therapeutic intervention in order to improve the following deficits and impairments:  Abnormal gait,Decreased activity tolerance,Decreased balance,Decreased mobility,Decreased strength,Difficulty walking,Decreased safety awareness,Impaired flexibility  Visit Diagnosis: Other abnormalities of  gait and mobility  Difficulty in walking, not elsewhere classified     Problem List Patient Active Problem List   Diagnosis Date Noted  . Porokeratosis 11/26/2018  . Corns and callosities 11/26/2018  . History of right shoulder replacement 10/07/2016  . Chronic pain of both knees 05/20/2016  . Osteoarthritis of both hands 05/20/2016  . Essential hypertension 05/20/2016  . Environmental allergies 05/20/2016  . Primary osteoarthritis of both knees 05/19/2016  . Callus of foot 11/24/2012  . Pain in joint, ankle and foot 09/15/2012    Terri Rorrer,ANGIE PTA 08/24/2020, 11:42 AM  [place]. [place], [place], [address] Phone: [phone]   Fax:  [phone]  Name: Danielle Dean MRN: [id number] Date of Birth: 1928/10/22

## 2020-08-27 NOTE — Progress Notes (Deleted)
Office Visit Note  Patient: Danielle Dean             Date of Birth: Nov 09, 1928           MRN: [id number]             PCP: Georgann Housekeeper, MD Referring: Georgann Housekeeper, MD Visit Date: 09/10/2020 Occupation: @GUAROCC @  Subjective:  No chief complaint on file.   History of Present Illness: Danielle Dean is a 87 y.o. female ***   Activities of Daily Living:  Patient reports morning stiffness for *** {minute/hour:19697}.   Patient {ACTIONS;DENIES/REPORTS:21021675::"Denies"} nocturnal pain.  Difficulty dressing/grooming: {ACTIONS;DENIES/REPORTS:21021675::"Denies"} Difficulty climbing stairs: {ACTIONS;DENIES/REPORTS:21021675::"Denies"} Difficulty getting out of chair: {ACTIONS;DENIES/REPORTS:21021675::"Denies"} Difficulty using hands for taps, buttons, cutlery, and/or writing: {ACTIONS;DENIES/REPORTS:21021675::"Denies"}  No Rheumatology ROS completed.   PMFS History:  Patient Active Problem List   Diagnosis Date Noted  . Porokeratosis 11/26/2018  . Corns and callosities 11/26/2018  . History of right shoulder replacement 10/07/2016  . Chronic pain of both knees 05/20/2016  . Osteoarthritis of both hands 05/20/2016  . Essential hypertension 05/20/2016  . Environmental allergies 05/20/2016  . Primary osteoarthritis of both knees 05/19/2016  . Callus of foot 11/24/2012  . Pain in joint, ankle and foot 09/15/2012    Past Medical History:  Diagnosis Date  . Callus   . Cataract fragments in both eyes following surgery   . Corns and callosities   . Hx of hysterectomy   . Hypertension     Family History  Problem Relation Age of Onset  . Arthritis Mother   . Diabetes Father    Past Surgical History:  Procedure Laterality Date  . ABDOMINAL HYSTERECTOMY    . NASAL SEPTUM SURGERY    . ROTATOR CUFF REPAIR Right   . TOTAL SHOULDER ARTHROPLASTY     Social History   Social History Narrative  . Not on file   Immunization History  Administered Date(s) Administered  .  PFIZER(Purple Top)SARS-COV-2 Vaccination 07/06/2019, 07/27/2019, 03/10/2020     Objective: Vital Signs: There were no vitals taken for this visit.   Physical Exam   Musculoskeletal Exam: ***  CDAI Exam: CDAI Score: -- Patient Global: --; Provider Global: -- Swollen: --; Tender: -- Joint Exam 09/10/2020   No joint exam has been documented for this visit   There is currently no information documented on the homunculus. Go to the Rheumatology activity and complete the homunculus joint exam.  Investigation: No additional findings.  Imaging: No results found.  Recent Labs: Lab Results  Component Value Date   WBC 7.0 06/14/2020   HGB 11.1 (L) 06/14/2020   PLT 239 06/14/2020   NA 130 (L) 06/14/2020   K 4.8 06/14/2020   CL 99 06/14/2020   CO2 26 06/14/2020   GLUCOSE 107 (H) 06/14/2020   BUN 17 06/14/2020   CREATININE 0.76 06/14/2020   BILITOT 0.4 06/14/2020   AST 16 06/14/2020   ALT 12 06/14/2020   PROT 6.2 06/14/2020   PROT 6.0 (L) 06/14/2020   CALCIUM 8.9 06/14/2020   GFRAA 81 06/14/2020    Speciality Comments: Please get labs prior to Prolia injection.  Procedures:  No procedures performed Allergies: Actonel [risedronate], Fosamax [alendronate], and Lisinopril   Assessment / Plan:     Visit Diagnoses: No diagnosis found.  Orders: No orders of the defined types were placed in this encounter.  No orders of the defined types were placed in this encounter.   Face-to-face time spent with patient was *** minutes. Greater  than 50% of time was spent in counseling and coordination of care.  Follow-Up Instructions: No follow-ups on file.   Earnestine Mealing, CMA  Note - This record has been created using Editor, commissioning.  Chart creation errors have been sought, but may not always  have been located. Such creation errors do not reflect on  the standard of medical care.

## 2020-08-29 ENCOUNTER — Other Ambulatory Visit: Payer: Self-pay

## 2020-08-29 ENCOUNTER — Ambulatory Visit: Payer: Medicare Other | Admitting: Physical Therapy

## 2020-08-29 ENCOUNTER — Encounter: Payer: Self-pay | Admitting: Physical Therapy

## 2020-08-29 DIAGNOSIS — M545 Low back pain, unspecified: Secondary | ICD-10-CM

## 2020-08-29 DIAGNOSIS — R262 Difficulty in walking, not elsewhere classified: Secondary | ICD-10-CM

## 2020-08-29 DIAGNOSIS — R2689 Other abnormalities of gait and mobility: Secondary | ICD-10-CM | POA: Diagnosis not present

## 2020-08-29 NOTE — Therapy (Signed)
[facility] [address] [place], [place], [address] Phone: [phone]   Fax:  [phone]  Physical Therapy Treatment  Patient Details  Name: Danielle Dean MRN: [id number] Date of Birth: 09/28/1928 Referring Provider (PT): Deveshwar   Encounter Date: 08/29/2020   PT End of Session - 08/29/20 1140    Visit Number 16    Date for PT Re-Evaluation 09/29/20    PT Start Time 1057    PT Stop Time 1142    PT Time Calculation (min) 45 min    Activity Tolerance Patient tolerated treatment well    Behavior During Therapy WFL for tasks assessed/performed           Past Medical History:  Diagnosis Date  . Callus   . Cataract fragments in both eyes following surgery   . Corns and callosities   . Hx of hysterectomy   . Hypertension     Past Surgical History:  Procedure Laterality Date  . ABDOMINAL HYSTERECTOMY    . NASAL SEPTUM SURGERY    . ROTATOR CUFF REPAIR Right   . TOTAL SHOULDER ARTHROPLASTY      There were no vitals filed for this visit.   Subjective Assessment - 08/29/20 1101    Subjective Pt reports injection did not help. No changes.    Currently in Pain? Yes    Pain Score 4     Pain Location Knee    Pain Orientation Right;Left                             [facility] Adult PT Treatment/Exercise - 08/29/20 0001      High Level Balance   High Level Balance Activities Side stepping;Backward walking;Tandem walking;Marching forwards;Marching backwards    High Level Balance Comments tandem stance firm surface x30 sec B, sidestepping on foam balance beam      Knee/Hip Exercises: Aerobic   Nustep L5 x 6 min    Other Aerobic UBE L2 2 min fwd/2 min backward      Knee/Hip Exercises: Machines for Strengthening   Cybex Knee Flexion 10lb 2x10    Other Machine rows and lats 10# 2x15      Knee/Hip Exercises: Standing   Heel Raises Both;1 set;10 reps    Heel Raises Limitations 2.5#    Hip Flexion  Both;1 set;10 reps    Hip Flexion Limitations 2.5#    Hip Abduction Both;10 reps;1 set    Abduction Limitations 2.5#    Hip Extension Both;1 set;10 reps    Extension Limitations 2.5#      Knee/Hip Exercises: Seated   Long Arc Quad Both;10 reps;2 sets    Long Arc Quad Limitations 2.5#    Clamshell with TheraBand Green   x15   Marching Both;2 sets;10 reps    Marching Limitations 2.5#    Sit to Sand 2 sets;10 reps;without UE support   2# chest press                   PT Short Term Goals - 07/03/20 1141      PT SHORT TERM GOAL #1   Title independent with initial HEP    Time 2    Period Weeks    Status Achieved    Target Date 07/09/20             PT Long Term Goals - 08/24/20 1109      PT LONG TERM GOAL #  1   Title tolerate walking >15 min with <5/10 pain    Status On-going      PT LONG TERM GOAL #2   Title increase BLE hip abduction MMT to 4/5    Status Partially Met      PT LONG TERM GOAL #3   Title report pain 50% less with cooking    Status Partially Met                 Plan - 08/29/20 1141    Clinical Impression Statement Pt making slow progress toward goals. Further discussion on transition to ind gym program; pt still hesitant to d/c feeling she is not quite ready. Reinforced education about the importance of progressing strength and balance ex's with pt VU. More standing ex's this rx; pt reporting increased R knee pain at end of session.    PT Treatment/Interventions ADLs/Self Care Home Management;Electrical Stimulation;Moist Heat;Gait training;Neuromuscular re-education;Balance training;Therapeutic exercise;Therapeutic activities;Functional mobility training;Stair training;Patient/family education    PT Next Visit Plan functional LE strength/endurance; pt plans to transition to independent gym program at discharge- pt will need renewal next session if not ready for transition to gym program    Consulted and Agree with Plan of Care Patient            Patient will benefit from skilled therapeutic intervention in order to improve the following deficits and impairments:  Abnormal gait,Decreased activity tolerance,Decreased balance,Decreased mobility,Decreased strength,Difficulty walking,Decreased safety awareness,Impaired flexibility  Visit Diagnosis: Other abnormalities of gait and mobility  Difficulty in walking, not elsewhere classified  Acute bilateral low back pain without sciatica     Problem List Patient Active Problem List   Diagnosis Date Noted  . Porokeratosis 11/26/2018  . Corns and callosities 11/26/2018  . History of right shoulder replacement 10/07/2016  . Chronic pain of both knees 05/20/2016  . Osteoarthritis of both hands 05/20/2016  . Essential hypertension 05/20/2016  . Environmental allergies 05/20/2016  . Primary osteoarthritis of both knees 05/19/2016  . Callus of foot 11/24/2012  . Pain in joint, ankle and foot 09/15/2012   Amanda Sugg, PT, DPT Amanda M Sugg 08/29/2020, 11:43 AM  [facility] [address] [place], [place], [address] Phone: [phone]   Fax:  [phone]  Name: Danielle Dean MRN: [id number] Date of Birth: 03/26/1929   

## 2020-08-31 ENCOUNTER — Ambulatory Visit: Payer: Medicare Other | Admitting: Physical Therapy

## 2020-08-31 ENCOUNTER — Other Ambulatory Visit: Payer: Self-pay

## 2020-08-31 DIAGNOSIS — M545 Low back pain, unspecified: Secondary | ICD-10-CM

## 2020-08-31 DIAGNOSIS — R2689 Other abnormalities of gait and mobility: Secondary | ICD-10-CM | POA: Diagnosis not present

## 2020-08-31 DIAGNOSIS — R262 Difficulty in walking, not elsewhere classified: Secondary | ICD-10-CM

## 2020-08-31 NOTE — Therapy (Signed)
[place]. [place], [place], [address] Phone: [phone]   Fax:  [phone]  Physical Therapy Treatment  Patient Details  Name: Danielle Dean MRN: [id number] Date of Birth: [birth year] Referring Provider (PT): Deveshwar   Encounter Date: 08/31/2020   PT End of Session - 08/31/20 1142    Visit Number 17    Date for PT Re-Evaluation 09/29/20    PT Start Time 1056    PT Stop Time 1141    PT Time Calculation (min) 45 min           Past Medical History:  Diagnosis Date  . Callus   . Cataract fragments in both eyes following surgery   . Corns and callosities   . Hx of hysterectomy   . Hypertension     Past Surgical History:  Procedure Laterality Date  . ABDOMINAL HYSTERECTOMY    . NASAL SEPTUM SURGERY    . ROTATOR CUFF REPAIR Right   . TOTAL SHOULDER ARTHROPLASTY      There were no vitals filed for this visit.   Subjective Assessment - 08/31/20 1102    Subjective my knees and back are just so bad    Currently in Pain? Yes    Pain Score 5                              [facility] Adult PT Treatment/Exercise - 08/31/20 0001      High Level Balance   High Level Balance Activities Side stepping;Backward walking;Tandem walking;Marching forwards;Marching backwards    High Level Balance Comments tandem stance firm surface x30 sec B, sidestepping on foam balance beam      Knee/Hip Exercises: Aerobic   Nustep L5 x 6 min    Other Aerobic UBE L3  2 min fwd/2 min backward      Knee/Hip Exercises: Machines for Strengthening   Other Machine rows and lats 10# 2x15      Knee/Hip Exercises: Standing   Walking with Sports Cord 10lb 4 way x 3 each       Knee/Hip Exercises: Seated   Long Arc Quad Both;10 reps;2 sets    Long Arc Quad Limitations 2.5#    Ball Squeeze 15x 3 sec hold    Clamshell with TheraBand Green   20x   Marching Both;2 sets;10 reps    Marching Limitations green tband     Hamstring Curl Strengthening;Both;2 sets;10 reps   green tband   Sit to Sand 2 sets;10 reps;without UE support   on airex, second set wt ball chest press                   PT Short Term Goals - 07/03/20 1141      PT SHORT TERM GOAL #1   Title independent with initial HEP    Time 2    Period Weeks    Status Achieved    Target Date 07/09/20             PT Long Term Goals - 08/31/20 1140      PT LONG TERM GOAL #1   Title tolerate walking >15 min with <5/10 pain    Status Partially Met      PT LONG TERM GOAL #2   Title increase BLE hip abduction MMT to 4/5    Baseline 4-/5    Status Partially Met      PT LONG TERM  GOAL #3   Title report pain 50% less with cooking    Status Partially Met                 Plan - 08/31/20 1142    Clinical Impression Statement progressing ex as pt tolerates. instability with activities on non compliant surfaces requiring assistance, also assistance needed with resisted gait. slow progression towards LTGs- working to get stronger without increasing pain    PT Treatment/Interventions ADLs/Self Care Home Management;Electrical Stimulation;Moist Heat;Gait training;Neuromuscular re-education;Balance training;Therapeutic exercise;Therapeutic activities;Functional mobility training;Stair training;Patient/family education    PT Next Visit Plan functional LE strength/endurance; pt plans to transition to independent gym program [facility]           Patient will benefit from skilled therapeutic intervention in order to improve the following deficits and impairments:  Abnormal gait,Decreased activity tolerance,Decreased balance,Decreased mobility,Decreased strength,Difficulty walking,Decreased safety awareness,Impaired flexibility  Visit Diagnosis: Other abnormalities of gait and mobility  Difficulty in walking, not elsewhere classified  Acute bilateral low back pain without sciatica     Problem List Patient Active Problem List    Diagnosis Date Noted  . Porokeratosis 11/26/2018  . Corns and callosities 11/26/2018  . History of right shoulder replacement 10/07/2016  . Chronic pain of both knees 05/20/2016  . Osteoarthritis of both hands 05/20/2016  . Essential hypertension 05/20/2016  . Environmental allergies 05/20/2016  . Primary osteoarthritis of both knees 05/19/2016  . Callus of foot 11/24/2012  . Pain in joint, ankle and foot 09/15/2012    Shakendra Griffeth,ANGIE PTA 08/31/2020, 11:45 AM  [place]. [place], [place], [address] Phone: [phone]   Fax:  [phone]  Name: Danielle Dean MRN: [id number] Date of Birth: [birth year]

## 2020-09-05 ENCOUNTER — Ambulatory Visit: Payer: Medicare Other | Admitting: Physical Therapy

## 2020-09-05 ENCOUNTER — Encounter: Payer: Self-pay | Admitting: Physical Therapy

## 2020-09-05 ENCOUNTER — Other Ambulatory Visit: Payer: Self-pay

## 2020-09-05 DIAGNOSIS — R262 Difficulty in walking, not elsewhere classified: Secondary | ICD-10-CM

## 2020-09-05 DIAGNOSIS — R2689 Other abnormalities of gait and mobility: Secondary | ICD-10-CM

## 2020-09-05 DIAGNOSIS — M545 Low back pain, unspecified: Secondary | ICD-10-CM

## 2020-09-05 NOTE — Therapy (Signed)
[place]. [place], [place], [address] Phone: [phone]   Fax:  [phone]  Physical Therapy Treatment  Patient Details  Name: Danielle Dean MRN: [id number] Date of Birth: November 17, 1928 Referring Provider (PT): Deveshwar   Encounter Date: 09/05/2020   PT End of Session - 09/05/20 1143    Visit Number 18    Date for PT Re-Evaluation 09/29/20    PT Start Time 1100    PT Stop Time 1143    PT Time Calculation (min) 43 min    Activity Tolerance Patient tolerated treatment well    Behavior During Therapy [facility] for tasks assessed/performed           Past Medical History:  Diagnosis Date  . Callus   . Cataract fragments in both eyes following surgery   . Corns and callosities   . Hx of hysterectomy   . Hypertension     Past Surgical History:  Procedure Laterality Date  . ABDOMINAL HYSTERECTOMY    . NASAL SEPTUM SURGERY    . ROTATOR CUFF REPAIR Right   . TOTAL SHOULDER ARTHROPLASTY      There were no vitals filed for this visit.   Subjective Assessment - 09/05/20 1103    Subjective Pt reports that she is feeling extra sore all over today and would like to go a little easier today's tx.    Currently in Pain? Yes    Pain Score 4     Pain Location Knee   and shoulders   Pain Orientation Right;Left                             [facility] Adult PT Treatment/Exercise - 09/05/20 0001      Knee/Hip Exercises: Aerobic   Nustep L5 x 6 min    Other Aerobic UBE L3  2 min fwd/2 min backward      Knee/Hip Exercises: Machines for Strengthening   Other Machine rows and lats 10# 1x15      Knee/Hip Exercises: Standing   Heel Raises Both;1 set;15 reps    Heel Raises Limitations 2.5#    Knee Flexion Both;1 set;10 reps    Knee Flexion Limitations 2.5#    Hip Flexion Both;1 set;10 reps    Hip Flexion Limitations 2.5#    Hip Abduction Both;10 reps;1 set    Abduction Limitations 2.5#    Hip Extension  Both;1 set;10 reps    Extension Limitations 2.5#    Walking with Sports Cord 20# 4 way x3 each      Knee/Hip Exercises: Seated   Long Arc Quad Both;15 reps;1 set    Long Arc Quad Limitations 2.5#    Ball Squeeze 15x 3 sec hold    Clamshell with TheraBand Green   x20   Marching Both;1 set;15 reps    Marching Limitations 2.5#    Hamstring Curl Strengthening;Both;1 set;15 reps    Hamstring Limitations green tb                    PT Short Term Goals - 07/03/20 1141      PT SHORT TERM GOAL #1   Title independent with initial HEP    Time 2    Period Weeks    Status Achieved    Target Date 07/09/20             PT Long Term Goals - 08/31/20 1140  PT LONG TERM GOAL #1   Title tolerate walking >15 min with <5/10 pain    Status Partially Met      PT LONG TERM GOAL #2   Title increase BLE hip abduction MMT to 4/5    Baseline 4-/5    Status Partially Met      PT LONG TERM GOAL #3   Title report pain 50% less with cooking    Status Partially Met                 Plan - 09/05/20 1144    Clinical Impression Statement Pt demos progress towards LTG per improvement on FOTO scores. Discussed again plan to discharge at last scheduled visit with updated HEP or to independent gym program with pt VU and agreement. Occasional posterior LOB with high level balance ex's. CGA-minA with resisted gait, esp side stepping.    PT Treatment/Interventions ADLs/Self Care Home Management;Electrical Stimulation;Moist Heat;Gait training;Neuromuscular re-education;Balance training;Therapeutic exercise;Therapeutic activities;Functional mobility training;Stair training;Patient/family education    PT Next Visit Plan functional LE strength/endurance; pt plans to transition to independent gym program at [employer] and Agree with Plan of Care Patient           Patient will benefit from skilled therapeutic intervention in order to improve the following deficits and  impairments:  Abnormal gait,Decreased activity tolerance,Decreased balance,Decreased mobility,Decreased strength,Difficulty walking,Decreased safety awareness,Impaired flexibility  Visit Diagnosis: Other abnormalities of gait and mobility  Difficulty in walking, not elsewhere classified  Acute bilateral low back pain without sciatica     Problem List Patient Active Problem List   Diagnosis Date Noted  . Porokeratosis 11/26/2018  . Corns and callosities 11/26/2018  . History of right shoulder replacement 10/07/2016  . Chronic pain of both knees 05/20/2016  . Osteoarthritis of both hands 05/20/2016  . Essential hypertension 05/20/2016  . Environmental allergies 05/20/2016  . Primary osteoarthritis of both knees 05/19/2016  . Callus of foot 11/24/2012  . Pain in joint, ankle and foot 09/15/2012   Amador Cunas, PT, DPT Donald Prose Janissa Bertram 09/05/2020, 11:46 AM  [place]. [place], [place], [address] Phone: [phone]   Fax:  [phone]  Name: Danielle Dean MRN: [id number] Date of Birth: 04/06/1929

## 2020-09-07 ENCOUNTER — Other Ambulatory Visit: Payer: Self-pay

## 2020-09-07 ENCOUNTER — Ambulatory Visit: Payer: Medicare Other | Admitting: Physical Therapy

## 2020-09-07 ENCOUNTER — Other Ambulatory Visit (HOSPITAL_COMMUNITY): Payer: Self-pay

## 2020-09-07 DIAGNOSIS — M545 Low back pain, unspecified: Secondary | ICD-10-CM

## 2020-09-07 DIAGNOSIS — R2689 Other abnormalities of gait and mobility: Secondary | ICD-10-CM

## 2020-09-07 DIAGNOSIS — R262 Difficulty in walking, not elsewhere classified: Secondary | ICD-10-CM

## 2020-09-07 NOTE — Therapy (Signed)
[place]. [place], [place], [address] Phone: [phone]   Fax:  [phone]  Physical Therapy Treatment  Patient Details  Name: Danielle Dean MRN: [id number] Date of Birth: [birth year] Referring Provider (PT): Deveshwar   Encounter Date: 09/07/2020   PT End of Session - 09/07/20 1136    Visit Number 19    Date for PT Re-Evaluation 09/29/20    PT Start Time 1050    PT Stop Time 1136    PT Time Calculation (min) 46 min           Past Medical History:  Diagnosis Date  . Callus   . Cataract fragments in both eyes following surgery   . Corns and callosities   . Hx of hysterectomy   . Hypertension     Past Surgical History:  Procedure Laterality Date  . ABDOMINAL HYSTERECTOMY    . NASAL SEPTUM SURGERY    . ROTATOR CUFF REPAIR Right   . TOTAL SHOULDER ARTHROPLASTY      There were no vitals filed for this visit.   Subjective Assessment - 09/07/20 1053    Subjective pt reports fall earlier in week so requested lighter session last time, verb hot RT leg and feel. legs still sore but verb feeling beeter than earlier in week    Currently in Pain? Yes    Pain Score 4     Pain Location Knee    Pain Orientation Right;Left                             [facility] Adult PT Treatment/Exercise - 09/07/20 0001      Ambulation/Gait   Gait Comments up/down 10 steps with 1 rail -antalgic and difficult with stepping up on regulation height      High Level Balance   High Level Balance Activities Marching forwards;Marching backwards;Side stepping   HHA 10 feet 2 x each with 2.5# ankle wts   High Level Balance Comments ball toss on airex, ball toss with 1 leg prop- CGA   foam mat tandem fwd/back and side stepping     Knee/Hip Exercises: Aerobic   Nustep L5 x 6 min    Other Aerobic UBE L3  2 min fwd/2 min backward      Knee/Hip Exercises: Machines for Strengthening   Other Machine rows and lats 10#  2 sets 10      Knee/Hip Exercises: Standing   Walking with Sports Cord 20# 4 way x3 each      Knee/Hip Exercises: Seated   Long Arc Quad Strengthening;Both;2 sets;10 reps    Long Arc Quad Limitations 2.5#    Ball Squeeze 15x 3 sec hold    Clamshell with Marga Hoots                    PT Short Term Goals - 07/03/20 1141      PT SHORT TERM GOAL #1   Title independent with initial HEP    Time 2    Period Weeks    Status Achieved    Target Date 07/09/20             PT Long Term Goals - 09/07/20 1131      PT LONG TERM GOAL #1   Title tolerate walking >15 min with <5/10 pain. pt verb walking daily    Baseline 15-20 min 5-6/10    Status Partially Met  PT LONG TERM GOAL #2   Title increase BLE hip abduction MMT to 4/5    Status Achieved      PT LONG TERM GOAL #3   Title report pain 50% less with cooking    Baseline 25%- back bigger issue than knees    Status Partially Met      PT LONG TERM GOAL #4   Title --                 Plan - 09/07/20 1134    Clinical Impression Statement pt able to resume normal workout today after lighter ex last tx d/t fall. pt states fall was a result from hitting knee on something. progressing with LTGs. pt states walking daily 15-20 min then pain is 5-6/10 and has to stop. cooking and standing increases back pain    PT Treatment/Interventions ADLs/Self Care Home Management;Electrical Stimulation;Moist Heat;Gait training;Neuromuscular re-education;Balance training;Therapeutic exercise;Therapeutic activities;Functional mobility training;Stair training;Patient/family education    PT Next Visit Plan functional LE strength/endurance; pt plans to transition to independent gym program at [facility]           Patient will benefit from skilled therapeutic intervention in order to improve the following deficits and impairments:  Abnormal gait,Decreased activity tolerance,Decreased balance,Decreased mobility,Decreased  strength,Difficulty walking,Decreased safety awareness,Impaired flexibility  Visit Diagnosis: Other abnormalities of gait and mobility  Difficulty in walking, not elsewhere classified  Acute bilateral low back pain without sciatica     Problem List Patient Active Problem List   Diagnosis Date Noted  . Porokeratosis 11/26/2018  . Corns and callosities 11/26/2018  . History of right shoulder replacement 10/07/2016  . Chronic pain of both knees 05/20/2016  . Osteoarthritis of both hands 05/20/2016  . Essential hypertension 05/20/2016  . Environmental allergies 05/20/2016  . Primary osteoarthritis of both knees 05/19/2016  . Callus of foot 11/24/2012  . Pain in joint, ankle and foot 09/15/2012    Iridiana Fonner,ANGIE PTA 09/07/2020, 11:38 AM  [place]. [place], [place], [address] Phone: [phone]   Fax:  [phone]  Name: Danielle Dean MRN: [id number] Date of Birth: [birth year]

## 2020-09-10 ENCOUNTER — Ambulatory Visit: Payer: Medicare Other | Admitting: Rheumatology

## 2020-09-10 DIAGNOSIS — M170 Bilateral primary osteoarthritis of knee: Secondary | ICD-10-CM

## 2020-09-10 DIAGNOSIS — Z8679 Personal history of other diseases of the circulatory system: Secondary | ICD-10-CM

## 2020-09-10 DIAGNOSIS — G8929 Other chronic pain: Secondary | ICD-10-CM

## 2020-09-10 DIAGNOSIS — M5134 Other intervertebral disc degeneration, thoracic region: Secondary | ICD-10-CM

## 2020-09-10 DIAGNOSIS — Z96611 Presence of right artificial shoulder joint: Secondary | ICD-10-CM

## 2020-09-10 DIAGNOSIS — R5383 Other fatigue: Secondary | ICD-10-CM

## 2020-09-10 DIAGNOSIS — M810 Age-related osteoporosis without current pathological fracture: Secondary | ICD-10-CM

## 2020-09-10 DIAGNOSIS — M19042 Primary osteoarthritis, left hand: Secondary | ICD-10-CM

## 2020-09-10 DIAGNOSIS — M5136 Other intervertebral disc degeneration, lumbar region: Secondary | ICD-10-CM

## 2020-09-10 DIAGNOSIS — M4125 Other idiopathic scoliosis, thoracolumbar region: Secondary | ICD-10-CM

## 2020-09-12 ENCOUNTER — Ambulatory Visit: Payer: Medicare Other | Admitting: Physical Therapy

## 2020-09-12 ENCOUNTER — Encounter: Payer: Self-pay | Admitting: Physical Therapy

## 2020-09-12 ENCOUNTER — Other Ambulatory Visit: Payer: Self-pay

## 2020-09-12 DIAGNOSIS — R262 Difficulty in walking, not elsewhere classified: Secondary | ICD-10-CM

## 2020-09-12 DIAGNOSIS — R2689 Other abnormalities of gait and mobility: Secondary | ICD-10-CM | POA: Diagnosis not present

## 2020-09-12 DIAGNOSIS — M545 Low back pain, unspecified: Secondary | ICD-10-CM

## 2020-09-12 NOTE — Therapy (Signed)
[facility]. [place], [place], [address] Phone: [phone]   Fax:  [phone]  Physical Therapy Treatment Progress Note Reporting Period 07/27/20 to 09/12/20  See note below for Objective Data and Assessment of Progress/Goals.      Patient Details  Name: Danielle Dean MRN: [id number] Date of Birth: August 30, 1928 Referring Provider (PT): Danielle Dean   Encounter Date: 09/12/2020   PT End of Session - 09/12/20 1138    Visit Number 20    Date for PT Re-Evaluation 09/29/20    PT Start Time 1057    PT Stop Time 1138    PT Time Calculation (min) 41 min    Activity Tolerance Patient tolerated treatment well    Behavior During Therapy [facility] for tasks assessed/performed           Past Medical History:  Diagnosis Date  . Callus   . Cataract fragments in both eyes following surgery   . Corns and callosities   . Hx of hysterectomy   . Hypertension     Past Surgical History:  Procedure Laterality Date  . ABDOMINAL HYSTERECTOMY    . NASAL SEPTUM SURGERY    . ROTATOR CUFF REPAIR Right   . TOTAL SHOULDER ARTHROPLASTY      There were no vitals filed for this visit.   Subjective Assessment - 09/12/20 1112    Subjective Pt reports back to normal pain levels today, no soreness remaining from fall    Currently in Pain? Yes    Pain Score 4     Pain Location Knee    Pain Orientation Right;Left                             [facility] Adult PT Treatment/Exercise - 09/12/20 0001      Knee/Hip Exercises: Aerobic   Nustep L5 x 6 min    Other Aerobic UBE L3  2 min fwd/2 min backward      Knee/Hip Exercises: Machines for Strengthening   Cybex Knee Flexion 15#, 10# 2x10    Other Machine rows and lats 10# 2 sets 10      Knee/Hip Exercises: Standing   Heel Raises Both;1 set;15 reps    Hip Abduction Both;10 reps;1 set    Hip Extension Both;1 set;10 reps      Knee/Hip Exercises: Seated   Long Arc Quad  Strengthening;Both;2 sets;10 reps    Long Arc Quad Limitations 2.5#    Ball Squeeze 15x 3 sec hold    Clamshell with TheraBand Green    Marching Both;1 set;15 reps    Marching Limitations 2.5#    Hamstring Curl 15 reps    Sit to [employer] 2 sets;10 reps;without UE support                    PT Short Term Goals - 07/03/20 1141      PT SHORT TERM GOAL #1   Title independent with initial HEP    Time 2    Period Weeks    Status Achieved    Target Date 07/09/20             PT Long Term Goals - 09/07/20 1131      PT LONG TERM GOAL #1   Title tolerate walking >15 min with <5/10 pain. pt verb walking daily    Baseline 15-20 min 5-6/10    Status Partially Met  PT LONG TERM GOAL #2   Title increase BLE hip abduction MMT to 4/5    Status Achieved      PT LONG TERM GOAL #3   Title report pain 50% less with cooking    Baseline 25%- back bigger issue than knees    Status Partially Met      PT LONG TERM GOAL #4   Title --                 Plan - 09/12/20 1139    Clinical Impression Statement Pt making minimal progress toward LTGs. Discussed again with pt discharge at the last scheduled rx; pt with difficulty understanding role of PT but reinforcing education on continuance of HEP after discharge to prevent regression. Continue working on functional LE strength and balance.    PT Treatment/Interventions ADLs/Self Care Home Management;Electrical Stimulation;Moist Heat;Gait training;Neuromuscular re-education;Balance training;Therapeutic exercise;Therapeutic activities;Functional mobility training;Stair training;Patient/family education    PT Next Visit Plan functional LE strength/endurance; pt plans to transition to independent gym program at [employer] and Agree with Plan of Care Patient           Patient will benefit from skilled therapeutic intervention in order to improve the following deficits and impairments:  Abnormal gait,Decreased activity  tolerance,Decreased balance,Decreased mobility,Decreased strength,Difficulty walking,Decreased safety awareness,Impaired flexibility  Visit Diagnosis: Other abnormalities of gait and mobility  Difficulty in walking, not elsewhere classified  Acute bilateral low back pain without sciatica     Problem List Patient Active Problem List   Diagnosis Date Noted  . Porokeratosis 11/26/2018  . Corns and callosities 11/26/2018  . History of right shoulder replacement 10/07/2016  . Chronic pain of both knees 05/20/2016  . Osteoarthritis of both hands 05/20/2016  . Essential hypertension 05/20/2016  . Environmental allergies 05/20/2016  . Primary osteoarthritis of both knees 05/19/2016  . Callus of foot 11/24/2012  . Pain in joint, ankle and foot 09/15/2012   Amador Cunas, PT, DPT Donald Prose Fuquan Wilson 09/12/2020, 11:40 AM  [place]. [place], [place], [address] Phone: [phone]   Fax:  [phone]  Name: Danielle Dean MRN: [id number] Date of Birth: May 17, 1929

## 2020-09-14 ENCOUNTER — Ambulatory Visit: Payer: Medicare Other | Attending: Rheumatology | Admitting: Physical Therapy

## 2020-09-14 ENCOUNTER — Other Ambulatory Visit: Payer: Self-pay

## 2020-09-14 ENCOUNTER — Encounter: Payer: Self-pay | Admitting: Physical Therapy

## 2020-09-14 DIAGNOSIS — R262 Difficulty in walking, not elsewhere classified: Secondary | ICD-10-CM

## 2020-09-14 DIAGNOSIS — M545 Low back pain, unspecified: Secondary | ICD-10-CM | POA: Insufficient documentation

## 2020-09-14 DIAGNOSIS — R2689 Other abnormalities of gait and mobility: Secondary | ICD-10-CM | POA: Insufficient documentation

## 2020-09-14 NOTE — Therapy (Signed)
[place]. [place], [place], [address] Phone: [phone]   Fax:  [phone]  Physical Therapy Treatment  Patient Details  Name: Danielle Dean MRN: [id number] Date of Birth: October 11, 1928 Referring Provider (PT): Deveshwar   Encounter Date: 09/14/2020   PT End of Session - 09/14/20 1135    Visit Number 21    Date for PT Re-Evaluation 09/29/20    PT Start Time 1053    PT Stop Time 1135    PT Time Calculation (min) 42 min    Activity Tolerance Patient tolerated treatment well    Behavior During Therapy [facility] for tasks assessed/performed           Past Medical History:  Diagnosis Date  . Callus   . Cataract fragments in both eyes following surgery   . Corns and callosities   . Hx of hysterectomy   . Hypertension     Past Surgical History:  Procedure Laterality Date  . ABDOMINAL HYSTERECTOMY    . NASAL SEPTUM SURGERY    . ROTATOR CUFF REPAIR Right   . TOTAL SHOULDER ARTHROPLASTY      There were no vitals filed for this visit.   Subjective Assessment - 09/14/20 1053    Subjective Pt reports doing well today, no changes    Currently in Pain? Yes    Pain Score 4     Pain Location Knee    Pain Orientation Right;Left                             [facility] Adult PT Treatment/Exercise - 09/14/20 0001      Ambulation/Gait   Gait Comments up/down 10 steps with 1 rail -antalgic and difficult with stepping up on regulation height      High Level Balance   High Level Balance Activities Side stepping;Backward walking;Marching forwards;Marching backwards    High Level Balance Comments marching on airex, sidestepping foam balance beam      Knee/Hip Exercises: Aerobic   Nustep L5 x 6 min    Other Aerobic UBE L3  2 min fwd/2 min backward      Knee/Hip Exercises: Machines for Strengthening   Cybex Knee Flexion 15# 2x10    Other Machine rows and lats 10# 2 sets 10      Knee/Hip Exercises:  Standing   Walking with Sports Cord 20# 4 way x3 each      Knee/Hip Exercises: Seated   Long Arc Quad Strengthening;Both;2 sets;10 reps    Long Arc Quad Limitations 2.5#    Ball Squeeze 15x 3 sec hold    Clamshell with [employer]   1x15   Marching Both;1 set;15 reps    Marching Limitations 2.5#    Sit to [employer] 2 sets;10 reps;without UE support   on airex                   PT Short Term Goals - 07/03/20 1141      PT SHORT TERM GOAL #1   Title independent with initial HEP    Time 2    Period Weeks    Status Achieved    Target Date 07/09/20             PT Long Term Goals - 09/07/20 1131      PT LONG TERM GOAL #1   Title tolerate walking >15 min with <5/10 pain. pt verb walking daily  Baseline 15-20 min 5-6/10    Status Partially Met      PT LONG TERM GOAL #2   Title increase BLE hip abduction MMT to 4/5    Status Achieved      PT LONG TERM GOAL #3   Title report pain 50% less with cooking    Baseline 25%- back bigger issue than knees    Status Partially Met      PT LONG TERM GOAL #4   Title --                 Plan - 09/14/20 1136    Clinical Impression Statement Pt required CGA-minA with resisted gait esp with sidestepping. Occasional LOB with inability to self correct. Tendency for posterior LOB with sidestepping on foam; close supervision today, able to self correct. Pt able to tolerate slight increase in weight for knee flexion machine. Continue working on functional LE strength and balance. D/c at last scheduled rx.    PT Treatment/Interventions ADLs/Self Care Home Management;Electrical Stimulation;Moist Heat;Gait training;Neuromuscular re-education;Balance training;Therapeutic exercise;Therapeutic activities;Functional mobility training;Stair training;Patient/family education    PT Next Visit Plan functional LE strength/endurance; pt plans to transition to independent gym program at [employer] and Agree with Plan of Care Patient            Patient will benefit from skilled therapeutic intervention in order to improve the following deficits and impairments:  Abnormal gait,Decreased activity tolerance,Decreased balance,Decreased mobility,Decreased strength,Difficulty walking,Decreased safety awareness,Impaired flexibility  Visit Diagnosis: Other abnormalities of gait and mobility  Difficulty in walking, not elsewhere classified  Acute bilateral low back pain without sciatica     Problem List Patient Active Problem List   Diagnosis Date Noted  . Porokeratosis 11/26/2018  . Corns and callosities 11/26/2018  . History of right shoulder replacement 10/07/2016  . Chronic pain of both knees 05/20/2016  . Osteoarthritis of both hands 05/20/2016  . Essential hypertension 05/20/2016  . Environmental allergies 05/20/2016  . Primary osteoarthritis of both knees 05/19/2016  . Callus of foot 11/24/2012  . Pain in joint, ankle and foot 09/15/2012   Amador Cunas, PT, DPT Donald Prose Maryelizabeth Eberle 09/14/2020, 11:38 AM  [place]. [place], [place], [address] Phone: [phone]   Fax:  [phone]  Name: KYLIEGH JESTER MRN: [id number] Date of Birth: 02-11-1929

## 2020-09-20 ENCOUNTER — Other Ambulatory Visit: Payer: Self-pay

## 2020-09-20 ENCOUNTER — Ambulatory Visit: Payer: Medicare Other | Admitting: Physical Therapy

## 2020-09-20 DIAGNOSIS — R2689 Other abnormalities of gait and mobility: Secondary | ICD-10-CM

## 2020-09-20 DIAGNOSIS — R262 Difficulty in walking, not elsewhere classified: Secondary | ICD-10-CM

## 2020-09-20 NOTE — Therapy (Signed)
[place]. [place], [place], [address] Phone: [phone]   Fax:  [phone]  Physical Therapy Treatment  Patient Details  Name: Danielle Dean MRN: [id number] Date of Birth: 17-Feb-1929 Referring Provider (PT): Deveshwar   Encounter Date: 09/20/2020   PT End of Session - 09/20/20 1142    Visit Number 22    Date for PT Re-Evaluation 09/29/20    PT Start Time 1100    PT Stop Time 1145    PT Time Calculation (min) 45 min           Past Medical History:  Diagnosis Date  . Callus   . Cataract fragments in both eyes following surgery   . Corns and callosities   . Hx of hysterectomy   . Hypertension     Past Surgical History:  Procedure Laterality Date  . ABDOMINAL HYSTERECTOMY    . NASAL SEPTUM SURGERY    . ROTATOR CUFF REPAIR Right   . TOTAL SHOULDER ARTHROPLASTY      There were no vitals filed for this visit.   Subjective Assessment - 09/20/20 1059    Subjective though I had some ankle wts but forgot to check. doing okay    Currently in Pain? Yes    Pain Score 5     Pain Location Knee    Pain Orientation Right;Left                             [facility] Adult PT Treatment/Exercise - 09/20/20 0001      High Level Balance   High Level Balance Activities Side stepping    High Level Balance Comments marching on airex, sidestepping foam balance beam      Knee/Hip Exercises: Aerobic   Nustep L4 x 6 min   LE only   Other Aerobic UBE L3 3  min fwd/3 min backward      Knee/Hip Exercises: Machines for Strengthening   Other Machine rows and lats 10# 15x      Knee/Hip Exercises: Standing   Knee Flexion Both;1 set;15 reps    Knee Flexion Limitations 2.5#    Hip Flexion Both;1 set;15 reps    Hip Flexion Limitations 2.5#    Hip Abduction Both;1 set;15 reps    Abduction Limitations 2.5#    Hip Extension Both;1 set;15 reps    Extension Limitations 2.5#    Lateral Step Up Both;10  reps;Hand Hold: 2;Step Height: 4"    Forward Step Up Both;10 reps;Hand Hold: 2;Step Height: 4"      Knee/Hip Exercises: Seated   Long Arc Quad Strengthening;Both;2 sets;10 reps    Long Arc Quad Limitations 2.5#    Marching Strengthening;Both;10 reps;Weights   on Clinical research associate Limitations 2.5#    Sit to [employer] with UE support;10 reps   on airex- legs tired at end of session so had to use UE                   PT Short Term Goals - 07/03/20 1141      PT SHORT TERM GOAL #1   Title independent with initial HEP    Time 2    Period Weeks    Status Achieved    Target Date 07/09/20             PT Long Term Goals - 09/07/20 1131      PT LONG TERM GOAL #1  Title tolerate walking >15 min with <5/10 pain. pt verb walking daily    Baseline 15-20 min 5-6/10    Status Partially Met      PT LONG TERM GOAL #2   Title increase BLE hip abduction MMT to 4/5    Status Achieved      PT LONG TERM GOAL #3   Title report pain 50% less with cooking    Baseline 25%- back bigger issue than knees    Status Partially Met      PT LONG TERM GOAL #4   Title --                 Plan - 09/20/20 1142    Clinical Impression Statement progressed reps and standing ex, plus Nustep with LE only and she tolerated well. legs very fatigued at end of session. discussed ankle wt ex for home and she is to look at wts.    PT Treatment/Interventions ADLs/Self Care Home Management;Electrical Stimulation;Moist Heat;Gait training;Neuromuscular re-education;Balance training;Therapeutic exercise;Therapeutic activities;Functional mobility training;Stair training;Patient/family education    PT Next Visit Plan D/C next week. GYm program or ankle wts HEP           Patient will benefit from skilled therapeutic intervention in order to improve the following deficits and impairments:  Abnormal gait,Decreased activity tolerance,Decreased balance,Decreased mobility,Decreased strength,Difficulty  walking,Decreased safety awareness,Impaired flexibility  Visit Diagnosis: Other abnormalities of gait and mobility  Difficulty in walking, not elsewhere classified     Problem List Patient Active Problem List   Diagnosis Date Noted  . Porokeratosis 11/26/2018  . Corns and callosities 11/26/2018  . History of right shoulder replacement 10/07/2016  . Chronic pain of both knees 05/20/2016  . Osteoarthritis of both hands 05/20/2016  . Essential hypertension 05/20/2016  . Environmental allergies 05/20/2016  . Primary osteoarthritis of both knees 05/19/2016  . Callus of foot 11/24/2012  . Pain in joint, ankle and foot 09/15/2012    Brandice Busser,ANGIE PTA 09/20/2020, 11:46 AM  [place]. [place], [place], [address] Phone: [phone]   Fax:  [phone]  Name: Danielle Dean MRN: [id number] Date of Birth: [birth year]

## 2020-09-25 ENCOUNTER — Encounter: Payer: Self-pay | Admitting: Physical Therapy

## 2020-09-25 ENCOUNTER — Other Ambulatory Visit: Payer: Self-pay

## 2020-09-25 ENCOUNTER — Ambulatory Visit: Payer: Medicare Other | Admitting: Physical Therapy

## 2020-09-25 DIAGNOSIS — M545 Low back pain, unspecified: Secondary | ICD-10-CM

## 2020-09-25 DIAGNOSIS — R2689 Other abnormalities of gait and mobility: Secondary | ICD-10-CM

## 2020-09-25 DIAGNOSIS — R262 Difficulty in walking, not elsewhere classified: Secondary | ICD-10-CM

## 2020-09-25 NOTE — Therapy (Signed)
[place]. [place], [place], [address] Phone: [phone]   Fax:  [phone]  Physical Therapy Treatment  Patient Details  Name: Danielle Dean MRN: [id number] Date of Birth: 02-13-1929 Referring Provider (PT): Deveshwar   Encounter Date: 09/25/2020   PT End of Session - 09/25/20 1151    Visit Number 23    Date for PT Re-Evaluation 09/29/20    PT Start Time 1100    PT Stop Time 1142    PT Time Calculation (min) 42 min    Activity Tolerance Patient tolerated treatment well    Behavior During Therapy [facility] for tasks assessed/performed           Past Medical History:  Diagnosis Date  . Callus   . Cataract fragments in both eyes following surgery   . Corns and callosities   . Hx of hysterectomy   . Hypertension     Past Surgical History:  Procedure Laterality Date  . ABDOMINAL HYSTERECTOMY    . NASAL SEPTUM SURGERY    . ROTATOR CUFF REPAIR Right   . TOTAL SHOULDER ARTHROPLASTY      There were no vitals filed for this visit.   Subjective Assessment - 09/25/20 1102    Subjective Pt reports she does not have ankle weights anymore; could not find them the other day.    Currently in Pain? Yes    Pain Score 4     Pain Location Knee    Pain Orientation Right;Left                             [facility] Adult PT Treatment/Exercise - 09/25/20 0001      Knee/Hip Exercises: Aerobic   Nustep L5 x 6 min    Other Aerobic UBE L3 3  min fwd/3 min backward      Knee/Hip Exercises: Machines for Strengthening   Cybex Knee Flexion 15# 2x10    Other Machine rows and lats 10# 2x15      Knee/Hip Exercises: Standing   Heel Raises Both;1 set;15 reps    Knee Flexion Both;1 set;15 reps    Hip Flexion Both;1 set;15 reps    Hip Abduction Both;1 set;15 reps    Hip Extension Both;1 set;15 reps      Knee/Hip Exercises: Seated   Long Arc Quad Strengthening;Both;2 sets;10 reps    Long Arc Quad  Limitations 2.5#    Marching Strengthening;Both;10 reps;Weights    Marching Limitations 2.5#    Sit to [employer] without UE support;3 sets;5 reps   on airex                   PT Short Term Goals - 07/03/20 1141      PT SHORT TERM GOAL #1   Title independent with initial HEP    Time 2    Period Weeks    Status Achieved    Target Date 07/09/20             PT Long Term Goals - 09/07/20 1131      PT LONG TERM GOAL #1   Title tolerate walking >15 min with <5/10 pain. pt verb walking daily    Baseline 15-20 min 5-6/10    Status Partially Met      PT LONG TERM GOAL #2   Title increase BLE hip abduction MMT to 4/5    Status Achieved      PT  LONG TERM GOAL #3   Title report pain 50% less with cooking    Baseline 25%- back bigger issue than knees    Status Partially Met      PT LONG TERM GOAL #4   Title --                 Plan - 09/25/20 1151    Clinical Impression Statement Pt tolerated progression of TE well. Able to complete increased reps/weight with no additional pain. Did have decreased functional capacity to perform STS by end of session d/t LE fatigue. Plan for discharge at next rx.    PT Treatment/Interventions ADLs/Self Care Home Management;Electrical Stimulation;Moist Heat;Gait training;Neuromuscular re-education;Balance training;Therapeutic exercise;Therapeutic activities;Functional mobility training;Stair training;Patient/family education    PT Next Visit Plan discharge at next rx with updated HEP    Consulted and Agree with Plan of Care Patient           Patient will benefit from skilled therapeutic intervention in order to improve the following deficits and impairments:  Abnormal gait,Decreased activity tolerance,Decreased balance,Decreased mobility,Decreased strength,Difficulty walking,Decreased safety awareness,Impaired flexibility  Visit Diagnosis: Other abnormalities of gait and mobility  Difficulty in walking, not elsewhere  classified  Acute bilateral low back pain without sciatica     Problem List Patient Active Problem List   Diagnosis Date Noted  . Porokeratosis 11/26/2018  . Corns and callosities 11/26/2018  . History of right shoulder replacement 10/07/2016  . Chronic pain of both knees 05/20/2016  . Osteoarthritis of both hands 05/20/2016  . Essential hypertension 05/20/2016  . Environmental allergies 05/20/2016  . Primary osteoarthritis of both knees 05/19/2016  . Callus of foot 11/24/2012  . Pain in joint, ankle and foot 09/15/2012   Amador Cunas, PT, DPT Donald Prose Geovana Gebel 09/25/2020, 11:52 AM  [place]. [place], [place], [address] Phone: [phone]   Fax:  [phone]  Name: GIARA MCGAUGHEY MRN: [id number] Date of Birth: [birth year]

## 2020-09-27 ENCOUNTER — Encounter: Payer: Self-pay | Admitting: Physical Therapy

## 2020-09-27 ENCOUNTER — Other Ambulatory Visit: Payer: Self-pay

## 2020-09-27 ENCOUNTER — Ambulatory Visit: Payer: Medicare Other | Admitting: Physical Therapy

## 2020-09-27 DIAGNOSIS — R2689 Other abnormalities of gait and mobility: Secondary | ICD-10-CM

## 2020-09-27 DIAGNOSIS — M545 Low back pain, unspecified: Secondary | ICD-10-CM

## 2020-09-27 DIAGNOSIS — R262 Difficulty in walking, not elsewhere classified: Secondary | ICD-10-CM

## 2020-09-27 NOTE — Therapy (Signed)
[place]. [place], [place], [address] Phone: [phone]   Fax:  [phone]  Physical Therapy Treatment  Patient Details  Name: Danielle Dean MRN: [id number] Date of Birth: Jul 04, 1928 Referring Provider (PT): Deveshwar   Encounter Date: 09/27/2020   PT End of Session - 09/27/20 1058    Visit Number 24    Date for PT Re-Evaluation 09/29/20    PT Start Time 3154    PT Stop Time 1058    PT Time Calculation (min) 43 min    Activity Tolerance Patient tolerated treatment well    Behavior During Therapy [facility] for tasks assessed/performed           Past Medical History:  Diagnosis Date  . Callus   . Cataract fragments in both eyes following surgery   . Corns and callosities   . Hx of hysterectomy   . Hypertension     Past Surgical History:  Procedure Laterality Date  . ABDOMINAL HYSTERECTOMY    . NASAL SEPTUM SURGERY    . ROTATOR CUFF REPAIR Right   . TOTAL SHOULDER ARTHROPLASTY      There were no vitals filed for this visit.   Subjective Assessment - 09/27/20 1018    Subjective Pt reports ready to d/c from therapy; plans to continue at home. May join ind gym program.    Currently in Pain? Yes    Pain Score 4     Pain Location Knee    Pain Orientation Right;Left                             [facility] Adult PT Treatment/Exercise - 09/27/20 0001      High Level Balance   High Level Balance Activities Side stepping;Backward walking;Tandem walking    High Level Balance Comments sidestepping foam, tandem walking fwd/bkwd on foam with UE support      Knee/Hip Exercises: Aerobic   Nustep L5 x 6 min    Other Aerobic L 1.5 3 min each way      Knee/Hip Exercises: Machines for Strengthening   Other Machine rows and lats 10# 2x15      Knee/Hip Exercises: Standing   Heel Raises Both;1 set;15 reps    Heel Raises Limitations 3#    Knee Flexion Both;1 set;10 reps    Knee Flexion  Limitations 3#    Hip Flexion Both;1 set;10 reps    Hip Flexion Limitations 3#    Hip Abduction Both;1 set;10 reps    Abduction Limitations 3#    Hip Extension Both;1 set;10 reps    Extension Limitations 3#      Knee/Hip Exercises: Seated   Long Arc Quad Strengthening;Both;2 sets;10 reps    Long Arc Quad Limitations 3    Ball Squeeze 15x 3 sec hold    Marching Strengthening;Both;10 reps;Weights    Marching Limitations 3    Sit to [employer] without UE support;3 sets;5 reps   on airex                   PT Short Term Goals - 07/03/20 1141      PT SHORT TERM GOAL #1   Title independent with initial HEP    Time 2    Period Weeks    Status Achieved    Target Date 07/09/20             PT Long Term Goals - 09/27/20 1026  PT LONG TERM GOAL #1   Title tolerate walking >15 min with <5/10 pain. pt verb walking daily    Baseline 20 min <5/10, verbs LBP; walks with rollator    Status Achieved      PT LONG TERM GOAL #2   Title increase BLE hip abduction MMT to 4/5    Status Achieved      PT LONG TERM GOAL #3   Title report pain 50% less with cooking    Baseline 25%- back bigger issue than knees    Status Partially Met                 Plan - 09/27/20 1058    Clinical Impression Statement Pt recommended for d/c today secondary to meeting most goals and being pleased with functional progress. Pt demos increased tolerance for standing activities along with increased functional balance. Does still demo B knee pain and LBP with prolonged standing/walking activities. Issued updated HEP with pt demo understanding of ex's. Pt educated on when to return if symptoms worsen/recur with VU and agreement.    PT Next Visit Plan discharged with updated HEP    Consulted and Agree with Plan of Care Patient           Patient will benefit from skilled therapeutic intervention in order to improve the following deficits and impairments:     Visit Diagnosis: Other abnormalities  of gait and mobility  Difficulty in walking, not elsewhere classified  Acute bilateral low back pain without sciatica     Problem List Patient Active Problem List   Diagnosis Date Noted  . Porokeratosis 11/26/2018  . Corns and callosities 11/26/2018  . History of right shoulder replacement 10/07/2016  . Chronic pain of both knees 05/20/2016  . Osteoarthritis of both hands 05/20/2016  . Essential hypertension 05/20/2016  . Environmental allergies 05/20/2016  . Primary osteoarthritis of both knees 05/19/2016  . Callus of foot 11/24/2012  . Pain in joint, ankle and foot 09/15/2012   PHYSICAL THERAPY DISCHARGE SUMMARY  Visits from Start of Care: 24   Plan: Patient agrees to discharge.  Patient goals were partially met. Patient is being discharged due to being pleased with the current functional level.  ?????      Amador Cunas, PT, DPT Donald Prose Melissaann Dizdarevic 09/27/2020, 11:00 AM  [place]. [place], [place], [address] Phone: [phone]   Fax:  [phone]  Name: Danielle Dean MRN: [id number] Date of Birth: 1928/06/24

## 2020-10-02 ENCOUNTER — Encounter: Payer: Medicare Other | Admitting: Physical Therapy

## 2020-10-05 ENCOUNTER — Encounter: Payer: Medicare Other | Admitting: Physical Therapy

## 2020-10-10 ENCOUNTER — Encounter: Payer: Medicare Other | Admitting: Physical Therapy

## 2020-10-12 ENCOUNTER — Encounter: Payer: Medicare Other | Admitting: Physical Therapy

## 2020-10-24 NOTE — Progress Notes (Signed)
Office Visit Note  Patient: Danielle Dean             Date of Birth: 1928/12/07           MRN: [id number]             PCP: Georgann Housekeeper, MD Referring: Georgann Housekeeper, MD Visit Date: 11/06/2020 Occupation: @GUAROCC @  Subjective:  Other (Bilateral knee pain and low back pain. )   History of Present Illness: Danielle Dean is a 87 y.o. female history of osteoarthritis and osteoporosis.  She continues to have pain and discomfort in multiple joints.  She states her left shoulder joint continues to hurt.  Right shoulder joint replacement is doing fairly well.  She has osteoarthritis in her hands which causes stiffness.  She has been experiencing increased pain in her knee joints.  She is going out of town to celebrate her 89th birthday and wants to feel better.  She wanted to have bilateral knee joint cortisone injections today.  She continues to have lower back pain.  She states that physical therapy was helpful.  She has to assist her husband who is bedridden which also puts his decrease the strain on her joints.  Activities of Daily Living:  Patient reports morning stiffness for a few minutes.   Patient Reports nocturnal pain.  Difficulty dressing/grooming: Denies Difficulty climbing stairs: Reports Difficulty getting out of chair: Denies Difficulty using hands for taps, buttons, cutlery, and/or writing: Reports  Review of Systems  Constitutional: Positive for fatigue. Negative for night sweats, weight gain and weight loss.  HENT: Negative for mouth sores, trouble swallowing, trouble swallowing, mouth dryness and nose dryness.   Eyes: Positive for dryness. Negative for pain, redness, itching and visual disturbance.  Respiratory: Negative for cough, shortness of breath and difficulty breathing.   Cardiovascular: Negative for chest pain, palpitations, hypertension, irregular heartbeat and swelling in legs/feet.  Gastrointestinal: Negative for blood in stool, constipation and  diarrhea.  Endocrine: Negative for increased urination.  Genitourinary: Negative for difficulty urinating and vaginal dryness.  Musculoskeletal: Positive for arthralgias, joint pain, myalgias, morning stiffness and myalgias. Negative for joint swelling, muscle weakness and muscle tenderness.  Skin: Negative for color change, rash, hair loss, redness, skin tightness, ulcers and sensitivity to sunlight.  Allergic/Immunologic: Positive for susceptible to infections.  Neurological: Positive for dizziness. Negative for numbness, headaches, memory loss, night sweats and weakness.  Hematological: Positive for bruising/bleeding tendency. Negative for swollen glands.  Psychiatric/Behavioral: Negative for depressed mood, confusion and sleep disturbance. The patient is not nervous/anxious.     PMFS History:  Patient Active Problem List   Diagnosis Date Noted  . Porokeratosis 11/26/2018  . Corns and callosities 11/26/2018  . History of right shoulder replacement 10/07/2016  . Chronic pain of both knees 05/20/2016  . Osteoarthritis of both hands 05/20/2016  . Essential hypertension 05/20/2016  . Environmental allergies 05/20/2016  . Primary osteoarthritis of both knees 05/19/2016  . Callus of foot 11/24/2012  . Pain in joint, ankle and foot 09/15/2012    Past Medical History:  Diagnosis Date  . Callus   . Cataract fragments in both eyes following surgery   . Corns and callosities   . Hx of hysterectomy   . Hypertension     Family History  Problem Relation Age of Onset  . Arthritis Mother   . Diabetes Father    Past Surgical History:  Procedure Laterality Date  . ABDOMINAL HYSTERECTOMY    . NASAL SEPTUM SURGERY    .  ROTATOR CUFF REPAIR Right   . TOTAL SHOULDER ARTHROPLASTY     Social History   Social History Narrative  . Not on file   Immunization History  Administered Date(s) Administered  . PFIZER(Purple Top)SARS-COV-2 Vaccination 07/06/2019, 07/27/2019, 03/10/2020      Objective: Vital Signs: BP (!) 146/61 (BP Location: Left Arm, Patient Position: Sitting, Cuff Size: Normal)   Pulse (!) 52   Resp 12   Ht 4' 7.25" (1.403 m)   Wt 104 lb 12.8 oz (47.5 kg)   BMI 24.14 kg/m    Physical Exam Vitals and nursing note reviewed.  Constitutional:      Appearance: She is well-developed.  HENT:     Head: Normocephalic and atraumatic.  Eyes:     Conjunctiva/sclera: Conjunctivae normal.  Cardiovascular:     Rate and Rhythm: Normal rate and regular rhythm.     Heart sounds: Normal heart sounds.  Pulmonary:     Effort: Pulmonary effort is normal.     Breath sounds: Normal breath sounds.  Abdominal:     General: Bowel sounds are normal.     Palpations: Abdomen is soft.  Musculoskeletal:     Cervical back: Normal range of motion.  Lymphadenopathy:     Cervical: No cervical adenopathy.  Skin:    General: Skin is warm and dry.     Capillary Refill: Capillary refill takes less than 2 seconds.  Neurological:     Mental Status: She is alert and oriented to person, place, and time.  Psychiatric:        Behavior: Behavior normal.      Musculoskeletal Exam: C-spine was in good range of motion.  She has thoracic kyphosis.  She has a scoliosis of the lumbar region.  There is no point tenderness.  She discomfort range of motion of her left shoulder joint.  Elbow joints with good range of motion.  She had bilateral PIP and DIP thickening with no synovitis.  Hip joints with good range of motion.  She had discomfort range of motion of bilateral knee joints with crepitus.  No warmth swelling effusion was noted.  There was no tenderness over ankles or MTPs.  CDAI Exam: CDAI Score: -- Patient Global: --; Provider Global: -- Swollen: --; Tender: -- Joint Exam 11/06/2020   No joint exam has been documented for this visit   There is currently no information documented on the homunculus. Go to the Rheumatology activity and complete the homunculus joint  exam.  Investigation: No additional findings.  Imaging: No results found.  Recent Labs: Lab Results  Component Value Date   WBC 7.0 06/14/2020   HGB 11.1 (L) 06/14/2020   PLT 239 06/14/2020   NA 130 (L) 06/14/2020   K 4.8 06/14/2020   CL 99 06/14/2020   CO2 26 06/14/2020   GLUCOSE 107 (H) 06/14/2020   BUN 17 06/14/2020   CREATININE 0.76 06/14/2020   BILITOT 0.4 06/14/2020   AST 16 06/14/2020   ALT 12 06/14/2020   PROT 6.2 06/14/2020   PROT 6.0 (L) 06/14/2020   CALCIUM 8.9 06/14/2020   GFRAA 81 06/14/2020    Speciality Comments: Please get labs prior to Prolia injection.  Procedures:  Large Joint Inj: bilateral knee on 11/06/2020 12:08 PM Indications: pain Details: 27 G 1.5 in needle, medial approach  Arthrogram: No  Medications (Right): 1.5 mL lidocaine 1 %; 40 mg triamcinolone acetonide 40 MG/ML Aspirate (Right): 0 mL Medications (Left): 1.5 mL lidocaine 1 %; 40 mg triamcinolone acetonide 40  MG/ML Aspirate (Left): 0 mL Outcome: tolerated well, no immediate complications Procedure, treatment alternatives, risks and benefits explained, specific risks discussed. Consent was given by the patient. Immediately prior to procedure a time out was called to verify the correct patient, procedure, equipment, support staff and site/side marked as required. Patient was prepped and draped in the usual sterile fashion.     Allergies: Actonel [risedronate], Fosamax [alendronate], and Lisinopril   Assessment / Plan:     Visit Diagnoses: Primary osteoarthritis of both knees-she has been experiencing increased pain and discomfort in her bilateral knee joints.  She is going to celebrate her 89th birthday and requested cortisone injection to bilateral knee joints due to ongoing pain and discomfort.  Indications side effects contraindications were discussed at length.  After informed consent was obtained bilateral knee joints were injected with cortisone as described above.  She was  accompanied by her daughter who assisted her.  Chronic left shoulder pain-she continues to have some discomfort in left shoulder.  History of right shoulder replacement-doing well.  Primary osteoarthritis of both hands-joint protection was discussed.  DDD (degenerative disc disease), thoracic-chronic discomfort.  Other idiopathic scoliosis, thoracolumbar region-  DDD (degenerative disc disease), lumbar-she continues to have chronic lower back pain.  She just finished physical therapy which was helpful.  She may consider physical therapy again in the future.  Age-related osteoporosis without current pathological fracture - DEXA: 1/3 Left distal radius BMD 0.421 with T-score -4.6. prolia injection: 06/27/2020.  She will be returning in July for next Prolia injection.  Use of calcium rich diet, vitamin D and resistive exercises were discussed.  Other fatigue-she continues to have some fatigue.  History of hypertension-blood pressure is better controlled now.  Orders: Orders Placed This Encounter  Procedures  . Large Joint Inj   No orders of the defined types were placed in this encounter.    Follow-Up Instructions: Return in about 2 months (around 01/06/2021) for Osteoarthritis.   Pollyann Savoy, MD  Note - This record has been created using Animal nutritionist.  Chart creation errors have been sought, but may not always  have been located. Such creation errors do not reflect on  the standard of medical care.

## 2020-11-05 ENCOUNTER — Telehealth: Payer: Self-pay | Admitting: Rheumatology

## 2020-11-05 NOTE — Telephone Encounter (Signed)
I received a phone call from patient's daughter Danielle Dean, with a rheumatologist in [place].  She stated that her mother is turning 27 and they are planning to celebrate her birthday.  They will be going out of town.  She wants her to have bilateral knee joint cortisone injection.  She also discussed possible future total knee replacement.  She is concerned about her severe degenerative disc disease.  I offered physical therapy.  Her daughter said that patient recently finished physical therapy.  Patient is coming to the office tomorrow for an appointment. Pollyann Savoy, MD

## 2020-11-06 ENCOUNTER — Ambulatory Visit (INDEPENDENT_AMBULATORY_CARE_PROVIDER_SITE_OTHER): Payer: Medicare Other | Admitting: Rheumatology

## 2020-11-06 ENCOUNTER — Other Ambulatory Visit: Payer: Self-pay

## 2020-11-06 ENCOUNTER — Encounter: Payer: Self-pay | Admitting: Rheumatology

## 2020-11-06 VITALS — BP 146/61 | HR 52 | Resp 12 | Ht <= 58 in | Wt 104.8 lb

## 2020-11-06 DIAGNOSIS — R5383 Other fatigue: Secondary | ICD-10-CM

## 2020-11-06 DIAGNOSIS — M25512 Pain in left shoulder: Secondary | ICD-10-CM | POA: Diagnosis not present

## 2020-11-06 DIAGNOSIS — Z96611 Presence of right artificial shoulder joint: Secondary | ICD-10-CM

## 2020-11-06 DIAGNOSIS — M19042 Primary osteoarthritis, left hand: Secondary | ICD-10-CM

## 2020-11-06 DIAGNOSIS — G8929 Other chronic pain: Secondary | ICD-10-CM

## 2020-11-06 DIAGNOSIS — M51369 Other intervertebral disc degeneration, lumbar region without mention of lumbar back pain or lower extremity pain: Secondary | ICD-10-CM | POA: Insufficient documentation

## 2020-11-06 DIAGNOSIS — M5136 Other intervertebral disc degeneration, lumbar region: Secondary | ICD-10-CM

## 2020-11-06 DIAGNOSIS — M4125 Other idiopathic scoliosis, thoracolumbar region: Secondary | ICD-10-CM

## 2020-11-06 DIAGNOSIS — M19041 Primary osteoarthritis, right hand: Secondary | ICD-10-CM | POA: Diagnosis not present

## 2020-11-06 DIAGNOSIS — M170 Bilateral primary osteoarthritis of knee: Secondary | ICD-10-CM

## 2020-11-06 DIAGNOSIS — M810 Age-related osteoporosis without current pathological fracture: Secondary | ICD-10-CM

## 2020-11-06 DIAGNOSIS — M5134 Other intervertebral disc degeneration, thoracic region: Secondary | ICD-10-CM

## 2020-11-06 DIAGNOSIS — Z8679 Personal history of other diseases of the circulatory system: Secondary | ICD-10-CM

## 2020-11-06 MED ORDER — TRIAMCINOLONE ACETONIDE 40 MG/ML IJ SUSP
40.0000 mg | INTRAMUSCULAR | Status: AC | PRN
  Administered 2020-11-06: 40 mg via INTRA_ARTICULAR

## 2020-11-06 MED ORDER — TRIAMCINOLONE ACETONIDE 40 MG/ML IJ SUSP
40.0000 mg | INTRAMUSCULAR | Status: AC | PRN
Start: 2020-11-06 — End: 2020-11-06
  Administered 2020-11-06: 40 mg via INTRA_ARTICULAR

## 2020-11-06 MED ORDER — LIDOCAINE HCL 1 % IJ SOLN
1.5000 mL | INTRAMUSCULAR | Status: AC | PRN
Start: 2020-11-06 — End: 2020-11-06
  Administered 2020-11-06: 1.5 mL

## 2020-11-06 MED ORDER — LIDOCAINE HCL 1 % IJ SOLN
1.5000 mL | INTRAMUSCULAR | Status: AC | PRN
  Administered 2020-11-06: 1.5 mL

## 2020-12-12 NOTE — Progress Notes (Signed)
Office Visit Note  Patient: Danielle Dean             Date of Birth: Dec 18, 1928           MRN: [id number]             PCP: Georgann Housekeeper, MD Referring: Georgann Housekeeper, MD Visit Date: 12/25/2020 Occupation: @GUAROCC @  Subjective:  Lower back pain.   History of Present Illness: Danielle Dean is a 87 y.o. female with a history of osteoarthritis, degenerative disc disease, scoliosis and osteoporosis.  She was seen in the emergency room due to severe lower back pain.  She states they did a CT scan of her abdomen which was negative.  She was also told that she did not have any vertebral fractures.  She continues to have some lower back discomfort.  She also has discomfort in her shoulders and arms.  She has discomfort in her knee joints.  She states she tried physical therapy in the past which was very helpful.  She would like to go for physical therapy again.  She denies any joint swelling.  Activities of Daily Living:  Patient reports morning stiffness for a few minutes.   Patient Denies nocturnal pain.  Difficulty dressing/grooming: Denies Difficulty climbing stairs: Reports Difficulty getting out of chair: Reports Difficulty using hands for taps, buttons, cutlery, and/or writing: Reports  Review of Systems  Constitutional:  Negative for fatigue.  HENT:  Negative for mouth sores, mouth dryness and nose dryness.   Eyes:  Positive for dryness. Negative for pain and itching.  Respiratory:  Negative for shortness of breath and difficulty breathing.   Cardiovascular:  Negative for chest pain and palpitations.  Gastrointestinal:  Negative for blood in stool, constipation and diarrhea.  Endocrine: Negative for increased urination.  Genitourinary:  Negative for difficulty urinating.  Musculoskeletal:  Positive for joint pain, joint pain, myalgias, morning stiffness and myalgias. Negative for joint swelling and muscle tenderness.  Skin:  Negative for color change, rash and redness.   Allergic/Immunologic: Negative for susceptible to infections.  Neurological:  Positive for weakness. Negative for dizziness, numbness, headaches and memory loss.  Hematological:  Positive for bruising/bleeding tendency.  Psychiatric/Behavioral:  Negative for confusion.    PMFS History:  Patient Active Problem List   Diagnosis Date Noted   DDD (degenerative disc disease), thoracic 11/06/2020   Other idiopathic scoliosis, thoracolumbar region 11/06/2020   DDD (degenerative disc disease), lumbar 11/06/2020   Age-related osteoporosis without current pathological fracture 11/06/2020   Porokeratosis 11/26/2018   Corns and callosities 11/26/2018   History of right shoulder replacement 10/07/2016   Chronic pain of both knees 05/20/2016   Osteoarthritis of both hands 05/20/2016   Essential hypertension 05/20/2016   Environmental allergies 05/20/2016   Primary osteoarthritis of both knees 05/19/2016   Callus of foot 11/24/2012   Pain in joint, ankle and foot 09/15/2012    Past Medical History:  Diagnosis Date   Arthritis    Callus    Cataract fragments in both eyes following surgery    Corns and callosities    Hx of hysterectomy    Hypertension    Osteoarthritis     Family History  Problem Relation Age of Onset   Arthritis Mother    Diabetes Father    Past Surgical History:  Procedure Laterality Date   ABDOMINAL HYSTERECTOMY     NASAL SEPTUM SURGERY     ROTATOR CUFF REPAIR Right    TOTAL SHOULDER ARTHROPLASTY  Social History   Social History Narrative   Not on file   Immunization History  Administered Date(s) Administered   PFIZER(Purple Top)SARS-COV-2 Vaccination 07/06/2019, 07/27/2019, 03/10/2020     Objective: Vital Signs: BP 136/68 (BP Location: Left Arm, Patient Position: Sitting, Cuff Size: Normal)   Pulse (!) 50   Ht 4' 7.25" (1.403 m)   Wt 103 lb (46.7 kg)   BMI 23.72 kg/m    Physical Exam Vitals and nursing note reviewed.  Constitutional:       Appearance: She is well-developed.  HENT:     Head: Normocephalic and atraumatic.  Eyes:     Conjunctiva/sclera: Conjunctivae normal.  Cardiovascular:     Rate and Rhythm: Normal rate and regular rhythm.     Heart sounds: Normal heart sounds.  Pulmonary:     Effort: Pulmonary effort is normal.     Breath sounds: Normal breath sounds.  Abdominal:     General: Bowel sounds are normal.     Palpations: Abdomen is soft.  Musculoskeletal:     Cervical back: Normal range of motion.  Lymphadenopathy:     Cervical: No cervical adenopathy.  Skin:    General: Skin is warm and dry.     Capillary Refill: Capillary refill takes less than 2 seconds.  Neurological:     Mental Status: She is alert and oriented to person, place, and time.  Psychiatric:        Behavior: Behavior normal.     Musculoskeletal Exam: C-spine was in good range of motion.  She had limited painful range of motion of her lumbar spine and scoliosis.  She had painful limited range of motion of her left shoulder joint.  Right shoulder was in good range of motion.  Ankles were in good range of motion.  She had bilateral PIP DIP and CMC prominence.  No synovitis was noted.  Hip joints and knee joints with good range of motion.  There was no tenderness over ankles or MTPs.  CDAI Exam: CDAI Score: -- Patient Global: --; Provider Global: -- Swollen: --; Tender: -- Joint Exam 12/25/2020   No joint exam has been documented for this visit   There is currently no information documented on the homunculus. Go to the Rheumatology activity and complete the homunculus joint exam.  Investigation: No additional findings.  Imaging: CT Abdomen Pelvis W Contrast  Result Date: 12/20/2020 CLINICAL DATA:  Right lower quadrant abdominal pain. EXAM: CT ABDOMEN AND PELVIS WITH CONTRAST TECHNIQUE: Multidetector CT imaging of the abdomen and pelvis was performed using the standard protocol following bolus administration of intravenous contrast.  CONTRAST:  OMNIPAQUE IOHEXOL 300 MG/ML  SOLN COMPARISON:  04/06/2006 FINDINGS: Lower chest: The cardio pericardial silhouette is enlarged. Minimal atelectasis noted dependent right lower lobe. Hepatobiliary: No suspicious focal abnormality within the liver parenchyma. There is no evidence for gallstones, gallbladder wall thickening, or pericholecystic fluid. No intrahepatic or extrahepatic biliary dilation. Pancreas: No focal mass lesion. No dilatation of the main duct. No intraparenchymal cyst. No peripancreatic edema. Spleen: No splenomegaly. No focal mass lesion. Adrenals/Urinary Tract: No adrenal nodule or mass. 3.9 x 6.1 x 6.6 cm exophytic vascular mass with marked areas of fat density identified in the upper pole right kidney, similar to prior and consistent with angiomyolipoma. Tiny hypodensities in the left kidney are too small to characterize. No evidence for hydroureter. Bladder is largely decompressed. Stomach/Bowel: Stomach is unremarkable. No gastric wall thickening. No evidence of outlet obstruction. Duodenum is normally positioned as is the  ligament of Treitz. No small bowel wall thickening. No small bowel dilatation. The terminal ileum is normal. The appendix is normal and well seen on sagittal image 39/9 and coronal images 45-56 of series 8. No gross colonic mass. No colonic wall thickening. Vascular/Lymphatic: There is moderate calcified aortic atherosclerosis without aneurysm. There is no gastrohepatic or hepatoduodenal ligament lymphadenopathy. No retroperitoneal or mesenteric lymphadenopathy. No pelvic sidewall lymphadenopathy. Reproductive: The uterus is surgically absent. There is no adnexal mass. Other: No intraperitoneal free fluid. Musculoskeletal: No worrisome lytic or sclerotic osseous abnormality. IMPRESSION: 1. No acute findings in the abdomen or pelvis. Specifically, no findings to explain the patient's history of right lower quadrant pain. The appendix, terminal ileum, and right  adnexal space are unremarkable. 2. Stable appearance of the 6.6 cm exophytic vascular mass with marked areas of fat density in the upper pole right kidney consistent with angiomyolipoma. 3. Aortic Atherosclerosis (ICD10-I70.0). Electronically Signed   By: Kennith CenterEric  Mansell M.D.   On: 12/20/2020 16:06    Recent Labs: Lab Results  Component Value Date   WBC 6.4 12/20/2020   HGB 11.1 (L) 12/20/2020   PLT 177 12/20/2020   NA 129 (L) 12/20/2020   K 4.3 12/20/2020   CL 98 12/20/2020   CO2 25 12/20/2020   GLUCOSE 89 12/20/2020   BUN 19 12/20/2020   CREATININE 0.63 12/20/2020   BILITOT 0.5 12/20/2020   ALKPHOS 50 12/20/2020   AST 19 12/20/2020   ALT 14 12/20/2020   PROT 6.3 (L) 12/20/2020   ALBUMIN 3.6 12/20/2020   CALCIUM 8.7 (L) 12/20/2020   GFRAA 81 06/14/2020    Speciality Comments: Please get labs prior to Prolia injection.  Procedures:  No procedures performed Allergies: Actonel [risedronate], Fosamax [alendronate], and Lisinopril   Assessment / Plan:     Visit Diagnoses: Primary osteoarthritis of both knees-she continues to have pain and discomfort in the bilateral knee joints.  She had good response to cortisone injections in May 2022.  No warmth swelling or effusion was noted.  Per her request she will be referred to physical therapy.  Chronic left shoulder pain-she has chronic discomfort.  History of right shoulder replacement-she had good range of motion without discomfort today  Primary osteoarthritis of both hands-joint protection muscle strengthening was discussed.  She had bilateral CMC, PIP and DIP thickening.  Other idiopathic scoliosis, thoracolumbar region-she has severe lumbar scoliosis.  DDD (degenerative disc disease), thoracic  Age-related osteoporosis without current pathological fracture - DEXA: 1/3 Left distal radius BMD 0.421 with T-score -4.6. prolia injection: 06/27/2020.  Her calcium was low at 8.7.  Have advised her to take calcium 600 mg twice daily for  the next 2 weeks.  We will check calcium level again.  If it is normal we will proceed with the next Prolia injection.  Prescription was sent today.  DDD (degenerative disc disease), lumbar-she has been having increased lower back pain in the paravertebral region.  I referred her to physical therapy for evaluation and treatment.  She is also interested in evaluation by a spine specialist.  I will refer her to Dr. Otelia SergeantNitka.  Other fatigue  History of hypertension-blood pressure is normal today.  Medication monitoring encounter - Plan: COMPLETE METABOLIC PANEL WITH GFR patient will return in 2 weeks to get labs.  Orders: Orders Placed This Encounter  Procedures   COMPLETE METABOLIC PANEL WITH GFR   AMB referral to orthopedics   Ambulatory referral to Physical Therapy    Meds ordered this encounter  Medications  denosumab (PROLIA) 60 MG/ML SOSY injection    Sig: INJECT 60 MG INTO THE SKIN EVERY 6 (SIX) MONTHS.    Dispense:  1 mL    Refill:  0    Time spent with patient was 30 minutes. Greater than 50% of time was spent in counseling and coordination of care.  Follow-Up Instructions: Return in about 3 months (around 03/27/2021) for Osteoarthritis.   Pollyann Savoy, MD  Note - This record has been created using Animal nutritionist.  Chart creation errors have been sought, but may not always  have been located. Such creation errors do not reflect on  the standard of medical care.

## 2020-12-20 ENCOUNTER — Other Ambulatory Visit: Payer: Self-pay

## 2020-12-20 ENCOUNTER — Other Ambulatory Visit (HOSPITAL_BASED_OUTPATIENT_CLINIC_OR_DEPARTMENT_OTHER): Payer: Self-pay

## 2020-12-20 ENCOUNTER — Encounter (HOSPITAL_BASED_OUTPATIENT_CLINIC_OR_DEPARTMENT_OTHER): Payer: Self-pay

## 2020-12-20 ENCOUNTER — Telehealth: Payer: Self-pay

## 2020-12-20 ENCOUNTER — Emergency Department (HOSPITAL_BASED_OUTPATIENT_CLINIC_OR_DEPARTMENT_OTHER)
Admission: EM | Admit: 2020-12-20 | Discharge: 2020-12-20 | Disposition: A | Discharge status: Home / Self Care | Payer: Medicare Other | Attending: Emergency Medicine | Admitting: Emergency Medicine

## 2020-12-20 ENCOUNTER — Emergency Department (HOSPITAL_BASED_OUTPATIENT_CLINIC_OR_DEPARTMENT_OTHER): Payer: Medicare Other

## 2020-12-20 DIAGNOSIS — R1031 Right lower quadrant pain: Secondary | ICD-10-CM | POA: Diagnosis present

## 2020-12-20 DIAGNOSIS — I10 Essential (primary) hypertension: Secondary | ICD-10-CM | POA: Diagnosis not present

## 2020-12-20 DIAGNOSIS — E871 Hypo-osmolality and hyponatremia: Secondary | ICD-10-CM | POA: Insufficient documentation

## 2020-12-20 DIAGNOSIS — Z79899 Other long term (current) drug therapy: Secondary | ICD-10-CM | POA: Diagnosis not present

## 2020-12-20 DIAGNOSIS — Z96611 Presence of right artificial shoulder joint: Secondary | ICD-10-CM | POA: Insufficient documentation

## 2020-12-20 DIAGNOSIS — G8929 Other chronic pain: Secondary | ICD-10-CM

## 2020-12-20 DIAGNOSIS — M545 Low back pain, unspecified: Secondary | ICD-10-CM | POA: Insufficient documentation

## 2020-12-20 HISTORY — DX: Unspecified osteoarthritis, unspecified site: M19.90

## 2020-12-20 LAB — CBC WITH DIFFERENTIAL/PLATELET
Abs Immature Granulocytes: 0.03 10*3/uL (ref 0.00–0.07)
Basophils Absolute: 0 10*3/uL (ref 0.0–0.1)
Basophils Relative: 1 %
Eosinophils Absolute: 0.1 10*3/uL (ref 0.0–0.5)
Eosinophils Relative: 2 %
HCT: 33.4 % — ABNORMAL LOW (ref 36.0–46.0)
Hemoglobin: 11.1 g/dL — ABNORMAL LOW (ref 12.0–15.0)
Immature Granulocytes: 1 %
Lymphocytes Relative: 24 %
Lymphs Abs: 1.5 10*3/uL (ref 0.7–4.0)
MCH: 30.2 pg (ref 26.0–34.0)
MCHC: 33.2 g/dL (ref 30.0–36.0)
MCV: 90.8 fL (ref 80.0–100.0)
Monocytes Absolute: 0.8 10*3/uL (ref 0.1–1.0)
Monocytes Relative: 13 %
Neutro Abs: 3.9 10*3/uL (ref 1.7–7.7)
Neutrophils Relative %: 59 %
Platelets: 177 10*3/uL (ref 150–400)
RBC: 3.68 MIL/uL — ABNORMAL LOW (ref 3.87–5.11)
RDW: 12.4 % (ref 11.5–15.5)
WBC: 6.4 10*3/uL (ref 4.0–10.5)
nRBC: 0 % (ref 0.0–0.2)

## 2020-12-20 LAB — COMPREHENSIVE METABOLIC PANEL
ALT: 14 U/L (ref 0–44)
AST: 19 U/L (ref 15–41)
Albumin: 3.6 g/dL (ref 3.5–5.0)
Alkaline Phosphatase: 50 U/L (ref 38–126)
Anion gap: 6 (ref 5–15)
BUN: 19 mg/dL (ref 8–23)
CO2: 25 mmol/L (ref 22–32)
Calcium: 8.7 mg/dL — ABNORMAL LOW (ref 8.9–10.3)
Chloride: 98 mmol/L (ref 98–111)
Creatinine, Ser: 0.63 mg/dL (ref 0.44–1.00)
GFR, Estimated: >60 mL/min (ref >60)
Glucose, Bld: 89 mg/dL (ref 70–99)
Potassium: 4.3 mmol/L (ref 3.5–5.1)
Sodium: 129 mmol/L — ABNORMAL LOW (ref 135–145)
Total Bilirubin: 0.5 mg/dL (ref 0.3–1.2)
Total Protein: 6.3 g/dL — ABNORMAL LOW (ref 6.5–8.1)

## 2020-12-20 LAB — URINALYSIS, ROUTINE W REFLEX MICROSCOPIC
Bilirubin Urine: NEGATIVE
Glucose, UA: NEGATIVE mg/dL
Hgb urine dipstick: NEGATIVE
Ketones, ur: NEGATIVE mg/dL
Leukocytes,Ua: NEGATIVE
Nitrite: NEGATIVE
Protein, ur: NEGATIVE mg/dL
Specific Gravity, Urine: 1.01 (ref 1.005–1.030)
pH: 7.5 (ref 5.0–8.0)

## 2020-12-20 LAB — LIPASE, BLOOD: Lipase: 43 U/L (ref 11–51)

## 2020-12-20 MED ORDER — SODIUM CHLORIDE 0.9 % IV BOLUS: 250.0000 mL | Freq: Once | INTRAVENOUS | Status: DC

## 2020-12-20 MED ORDER — SODIUM CHLORIDE 0.9 % IV SOLN: INTRAVENOUS | Status: DC

## 2020-12-20 MED ORDER — IOHEXOL 300 MG/ML  SOLN
100.0000 mL | Freq: Once | INTRAMUSCULAR | Status: AC | PRN
  Administered 2020-12-20: 100 mL via INTRAVENOUS

## 2020-12-20 MED ORDER — SODIUM CHLORIDE 0.9 % IV BOLUS
500.0000 mL | Freq: Once | INTRAVENOUS | Status: AC
  Administered 2020-12-20: 500 mL via INTRAVENOUS

## 2020-12-20 MED ORDER — ACETAMINOPHEN-CODEINE 300-30 MG PO TABS
1.0000 | ORAL_TABLET | Freq: Four times a day (QID) | ORAL | 0 refills | Status: DC | PRN
  Filled 2020-12-20: qty 10, 3d supply, fill #0

## 2020-12-20 NOTE — ED Triage Notes (Signed)
"  Lower back pain for a few months, but last night it began to hurt worse on right side and radiating to right flank" per pt

## 2020-12-20 NOTE — ED Notes (Signed)
Patient transported to CT 

## 2020-12-20 NOTE — Telephone Encounter (Signed)
Patient's son advised per Dr. Corliss Skains she should be elevated at the emergency room.

## 2020-12-20 NOTE — Telephone Encounter (Signed)
Patient's son states the pain started a few days ago. Patient is getting pain in her lower back around the waist area. Patient has been taking Tylenol arthritis and it is not helping. Patient's son states the pain got worse yesterday. Patient complained of the pain in her lower back and wrapping around the front. Patient had complaints of pain when she takes a deep breath. The family is concerned about a possible compression fracture. Patient has not had an injury or falls. Please advise.

## 2020-12-20 NOTE — Telephone Encounter (Signed)
Patient's son Viviano Simas called stating his mom is experiencing lower back pain that radiates around her right side.  Viviano Simas states he called his sister who is a Publishing rights manager in [place] and she is concerned that [place] might have a compression fracture.  Ajay states he gave her codeine last night to help her sleep.  Ajay states he stayed home from work and called to see if Dr. Corliss Skains needs her to come in the office to be evaluated since he knows their family so well or if he should take her to the ER.  Ajay requested a return call at #[phone]

## 2020-12-20 NOTE — Telephone Encounter (Signed)
Please advise that she should go the ED.

## 2020-12-20 NOTE — ED Provider Notes (Addendum)
[facility] EMERGENCY DEPARTMENT Provider Note   CSN: [id number] Arrival date & time: 12/20/20  1217     History Chief Complaint  Patient presents with   Back Pain    Lower back pain    Danielle Dean is a 87 y.o. female.  Patient with a history of some chronic back pain over the past few months.  But the concerning thing was that last night it kind of was right flank right lower quadrant area.  No history of kidney stones.  No nausea vomiting no diarrhea.  No fevers.  Patient comfortable currently.  Past medical history is hypertension and osteoarthritis.  Past surgery includes abdominal hysterectomy.      Past Medical History:  Diagnosis Date   Arthritis    Callus    Cataract fragments in both eyes following surgery    Corns and callosities    Hx of hysterectomy    Hypertension    Osteoarthritis     Patient Active Problem List   Diagnosis Date Noted   DDD (degenerative disc disease), thoracic 11/06/2020   Other idiopathic scoliosis, thoracolumbar region 11/06/2020   DDD (degenerative disc disease), lumbar 11/06/2020   Age-related osteoporosis without current pathological fracture 11/06/2020   Porokeratosis 11/26/2018   Corns and callosities 11/26/2018   History of right shoulder replacement 10/07/2016   Chronic pain of both knees 05/20/2016   Osteoarthritis of both hands 05/20/2016   Essential hypertension 05/20/2016   Environmental allergies 05/20/2016   Primary osteoarthritis of both knees 05/19/2016   Callus of foot 11/24/2012   Pain in joint, ankle and foot 09/15/2012    Past Surgical History:  Procedure Laterality Date   ABDOMINAL HYSTERECTOMY     NASAL SEPTUM SURGERY     ROTATOR CUFF REPAIR Right    TOTAL SHOULDER ARTHROPLASTY       OB History   No obstetric history on file.     Family History  Problem Relation Age of Onset   Arthritis Mother    Diabetes Father     Social History   Tobacco Use   Smoking status: Never    Smokeless tobacco: Never  Vaping Use   Vaping Use: Never used  Substance Use Topics   Alcohol use: No   Drug use: Never    Home Medications Prior to Admission medications   Medication Sig Start Date End Date Taking? Authorizing Provider  Acetaminophen (TYLENOL ARTHRITIS PAIN PO) Take 2 tablets by mouth daily.    [provider]  amLODipine (NORVASC) 2.5 MG tablet Take 2.5 mg by mouth daily.  07/21/16   [provider]  atenolol (TENORMIN) 50 MG tablet Take 50 mg by mouth daily.    [provider]  b complex vitamins tablet Take 1 tablet by mouth daily.    [provider]  calcium carbonate (OS-CAL) 600 MG TABS Take 600 mg by mouth daily.     [provider]  cholecalciferol (VITAMIN D) 1000 UNITS tablet Take 1,000 Units by mouth daily.    [provider]  denosumab (PROLIA) 60 MG/ML SOSY injection INJECT 60 MG INTO THE SKIN EVERY 6 (SIX) MONTHS. 06/18/20 06/18/21  Pollyann Savoy, MD  fluticasone (FLONASE) 50 MCG/ACT nasal spray as needed.  07/25/19   [provider]  losartan (COZAAR) 100 MG tablet Take 100 mg by mouth daily.  04/18/16   [provider]  TURMERIC PO Take by mouth daily.    [provider]    Allergies  Actonel [risedronate], Fosamax [alendronate], and Lisinopril  Review of Systems   Review of Systems  Constitutional:  Negative for chills and fever.  HENT:  Negative for ear pain and sore throat.   Eyes:  Negative for pain and visual disturbance.  Respiratory:  Negative for cough and shortness of breath.   Cardiovascular:  Negative for chest pain and palpitations.  Gastrointestinal:  Positive for abdominal pain. Negative for vomiting.  Genitourinary:  Positive for flank pain. Negative for dysuria and hematuria.  Musculoskeletal:  Positive for back pain. Negative for arthralgias.  Skin:  Negative for color change and rash.  Neurological:  Negative for seizures and syncope.  All other  systems reviewed and are negative.  Physical Exam Updated Vital Signs BP (!) 145/60   Pulse 63   Temp 97.6 F (36.4 C) (Oral)   Resp 16   Ht 1.397 m (4\' 7" )   Wt 45.4 kg   SpO2 100%   BMI 23.24 kg/m   Physical Exam Vitals and nursing note reviewed.  Constitutional:      General: She is not in acute distress.    Appearance: Normal appearance. She is well-developed.  HENT:     Head: Normocephalic and atraumatic.  Eyes:     Conjunctiva/sclera: Conjunctivae normal.  Cardiovascular:     Rate and Rhythm: Normal rate and regular rhythm.     Heart sounds: No murmur heard. Pulmonary:     Effort: Pulmonary effort is normal. No respiratory distress.     Breath sounds: Normal breath sounds.  Abdominal:     General: There is no distension.     Palpations: Abdomen is soft.     Tenderness: There is no abdominal tenderness. There is no guarding.  Musculoskeletal:     Cervical back: Neck supple.  Skin:    General: Skin is warm and dry.  Neurological:     General: No focal deficit present.     Mental Status: She is alert and oriented to person, place, and time.     Cranial Nerves: No cranial nerve deficit.     Sensory: No sensory deficit.     Motor: No weakness.    ED Results / Procedures / Treatments   Labs (all labs ordered are listed, but only abnormal results are displayed) Labs Reviewed  CBC WITH DIFFERENTIAL/PLATELET - Abnormal; Notable for the following components:      Result Value   RBC 3.68 (*)    Hemoglobin 11.1 (*)    HCT 33.4 (*)    All other components within normal limits  COMPREHENSIVE METABOLIC PANEL - Abnormal; Notable for the following components:   Sodium 129 (*)    Calcium 8.7 (*)    Total Protein 6.3 (*)    All other components within normal limits  URINALYSIS, ROUTINE W REFLEX MICROSCOPIC  LIPASE, BLOOD    EKG None  Radiology No results found.  Procedures Procedures   Medications Ordered in ED Medications  0.9 %  sodium chloride  infusion (has no administration in time range)  sodium chloride 0.9 % bolus 500 mL (has no administration in time range)  iohexol (OMNIPAQUE) 300 MG/ML solution 100 mL (100 mLs Intravenous Contrast Given 12/20/20 1519)    ED Course  I have reviewed the triage vital signs and the nursing notes.  Pertinent labs & imaging results that were available during my care of the patient were reviewed by me and considered in my medical decision making (see chart for details).    MDM Rules/Calculators/A&P  Family concerned because pain in a different location still has back pain associated with it but now it radiates down towards the right lower quadrant.  No history of kidney stones.  Decided to CT with contrast IV contrast that is to evaluate.  Patient's labs without significant abnormalities no leukocytosis.  Hemoglobin 11.1.  However sodium is low at 129.  Not critically low.  Renal function is normal.  Liver function test are normal.  And lipase is normal.  Urinalysis negative.  No hematuria.  CT scan of the abdomen still in process Final Clinical Impression(s) / ED Diagnoses Final diagnoses:  Hyponatremia  Right lower quadrant abdominal pain  Chronic midline low back pain without sciatica    Rx / DC Orders ED Discharge Orders     None        Vanetta Mulders, MD 12/20/20 1542  CT scan of the abdomen pelvis without any acute findings.  To include no abnormalities in the musculoskeletal component.  Discussed with patient's daughter who is a physician.  We will provide a prescription for Tylenol 3 to help with pain.  They are aware that the sodium was a little low and this is been a chronic problem.    Vanetta Mulders, MD 12/20/20 [phone]

## 2020-12-20 NOTE — Discharge Instructions (Addendum)
CAT scan of the abdomen without any acute findings and including looking at your musculoskeletal part of your back.  Urinalysis normal sodium slightly low.  You received some IV fluids to help boost that.  Prescription for Tylenol No. 3 provided.  Take as directed and as needed for pain.  Follow-up with your doctor.  Return for any new or worse symptoms.

## 2020-12-25 ENCOUNTER — Other Ambulatory Visit: Payer: Self-pay

## 2020-12-25 ENCOUNTER — Encounter: Payer: Self-pay | Admitting: Rheumatology

## 2020-12-25 ENCOUNTER — Telehealth: Payer: Self-pay

## 2020-12-25 ENCOUNTER — Ambulatory Visit (INDEPENDENT_AMBULATORY_CARE_PROVIDER_SITE_OTHER): Payer: Medicare Other | Admitting: Rheumatology

## 2020-12-25 ENCOUNTER — Other Ambulatory Visit (HOSPITAL_COMMUNITY): Payer: Self-pay

## 2020-12-25 VITALS — BP 136/68 | HR 50 | Ht <= 58 in | Wt 103.0 lb

## 2020-12-25 DIAGNOSIS — M810 Age-related osteoporosis without current pathological fracture: Secondary | ICD-10-CM

## 2020-12-25 DIAGNOSIS — M19042 Primary osteoarthritis, left hand: Secondary | ICD-10-CM

## 2020-12-25 DIAGNOSIS — G8929 Other chronic pain: Secondary | ICD-10-CM

## 2020-12-25 DIAGNOSIS — M19041 Primary osteoarthritis, right hand: Secondary | ICD-10-CM

## 2020-12-25 DIAGNOSIS — R5383 Other fatigue: Secondary | ICD-10-CM

## 2020-12-25 DIAGNOSIS — M5136 Other intervertebral disc degeneration, lumbar region: Secondary | ICD-10-CM

## 2020-12-25 DIAGNOSIS — M25512 Pain in left shoulder: Secondary | ICD-10-CM

## 2020-12-25 DIAGNOSIS — M4125 Other idiopathic scoliosis, thoracolumbar region: Secondary | ICD-10-CM

## 2020-12-25 DIAGNOSIS — M170 Bilateral primary osteoarthritis of knee: Secondary | ICD-10-CM

## 2020-12-25 DIAGNOSIS — M5134 Other intervertebral disc degeneration, thoracic region: Secondary | ICD-10-CM

## 2020-12-25 DIAGNOSIS — Z96611 Presence of right artificial shoulder joint: Secondary | ICD-10-CM | POA: Diagnosis not present

## 2020-12-25 DIAGNOSIS — Z8679 Personal history of other diseases of the circulatory system: Secondary | ICD-10-CM

## 2020-12-25 DIAGNOSIS — Z5181 Encounter for therapeutic drug level monitoring: Secondary | ICD-10-CM

## 2020-12-25 MED ORDER — DENOSUMAB 60 MG/ML ~~LOC~~ SOSY
PREFILLED_SYRINGE | SUBCUTANEOUS | 0 refills | Status: DC
  Filled 2020-12-25 – 2021-01-04 (×2): qty 1, 180d supply, fill #0

## 2020-12-25 NOTE — Patient Instructions (Signed)
Please return in 2 weeks for the labs 

## 2020-12-25 NOTE — Telephone Encounter (Signed)
Prolia prescription was sent to [facility] today and will be sent to the office. PRIOR to administering prolia, Dr. Corliss Skains would like to recheck patient's labs in 2 weeks. Thanks!

## 2021-01-04 ENCOUNTER — Other Ambulatory Visit (HOSPITAL_COMMUNITY): Payer: Self-pay

## 2021-01-04 NOTE — Telephone Encounter (Signed)
Patient reminded that she will need to come to the office next week for labs. Patient expressed understanding and states she will come on 01/08/2021.

## 2021-01-04 NOTE — Telephone Encounter (Signed)
Sent email to [facility] to courier Prolia to clinic upcoming week. Patient will be coming for repeat labs on 01/08/21 (per previous note from Richardo Priest)  Chesley Mires, PharmD, MPH, BCPS Clinical Pharmacist (Rheumatology and Pulmonology)

## 2021-01-07 ENCOUNTER — Other Ambulatory Visit: Payer: Self-pay

## 2021-01-07 ENCOUNTER — Other Ambulatory Visit (HOSPITAL_COMMUNITY): Payer: Self-pay

## 2021-01-07 ENCOUNTER — Ambulatory Visit: Payer: Medicare Other | Attending: Rheumatology

## 2021-01-07 DIAGNOSIS — M545 Low back pain, unspecified: Secondary | ICD-10-CM | POA: Insufficient documentation

## 2021-01-07 DIAGNOSIS — M25512 Pain in left shoulder: Secondary | ICD-10-CM

## 2021-01-07 DIAGNOSIS — M25562 Pain in left knee: Secondary | ICD-10-CM | POA: Insufficient documentation

## 2021-01-07 DIAGNOSIS — M25561 Pain in right knee: Secondary | ICD-10-CM | POA: Diagnosis present

## 2021-01-07 DIAGNOSIS — G8929 Other chronic pain: Secondary | ICD-10-CM | POA: Insufficient documentation

## 2021-01-07 DIAGNOSIS — R2689 Other abnormalities of gait and mobility: Secondary | ICD-10-CM

## 2021-01-07 NOTE — Patient Instructions (Signed)
Access Code: [facility] URL: [email or web address] Date: 01/07/2021 Prepared by: Claude Manges  Exercises Seated Marching with Opposite Shoulder Flexion - 1 x daily - 7 x weekly - 2 sets - 10 reps Seated Long Arc Quad - 1 x daily - 7 x weekly - 2 sets - 10 reps Sit to Stand with Counter Support - 1 x daily - 7 x weekly - 5 sets - 5 reps Seated Scapular Retraction - 1 x daily - 7 x weekly - 3 sets - 10 reps Ice - 1 x daily - 7 x weekly - 10-20 minute hold Seated Lumbar Flexion Stretch - 1 x daily - 7 x weekly - 3 sets - 10 reps Seated Diaphragmatic Breathing - 2 x daily - 7 x weekly - 5 reps

## 2021-01-07 NOTE — Therapy (Signed)
[facility] [address] [place], [name], [address] Phone: [phone]   Fax:  [phone]  Physical Therapy Evaluation  Patient Details  Name: Danielle Dean MRN: [id number] Date of Birth: [birth year] Referring Provider (PT): Pollyann Savoy, MD   Encounter Date: 01/07/2021   PT End of Session - 01/07/21 1130     Visit Number 1    Date for PT Re-Evaluation 03/04/21    PT Start Time 1045    PT Stop Time 1125    PT Time Calculation (min) 40 min    Activity Tolerance Patient tolerated treatment well;Patient limited by pain    Behavior During Therapy [facility] for tasks assessed/performed             Past Medical History:  Diagnosis Date   Arthritis    Callus    Cataract fragments in both eyes following surgery    Corns and callosities    Hx of hysterectomy    Hypertension    Osteoarthritis     Past Surgical History:  Procedure Laterality Date   ABDOMINAL HYSTERECTOMY     NASAL SEPTUM SURGERY     ROTATOR CUFF REPAIR Right    TOTAL SHOULDER ARTHROPLASTY      There were no vitals filed for this visit.    Subjective Assessment - 01/07/21 1047     Subjective Pt returns for Low back pain bothering most (right side more than left) and gets worse as the day goes on.  Had to go to ED recently for back pain radiating to the front of abdomen, nothing wrong with kidneys and d/c for LBP and electrolyte imbalance. Balance is very bad, uses rollator out of house. Also with L shoudler and B knee pain    Pertinent History osteoporosis    Patient Stated Goals less pain. no falls    Currently in Pain? Yes    Pain Score 6     Pain Location Back    Pain Orientation Right    Pain Descriptors / Indicators Sharp    Multiple Pain Sites Yes                [facility] PT Assessment - 01/07/21 1047       Assessment   Medical Diagnosis M17.0 (ICD-10-CM) - Primary osteoarthritis of both knees  M25.512,G89.29 (ICD-10-CM) - Chronic  left shoulder pain  M19.041,M19.042 (ICD-10-CM) - Primary osteoarthritis of both hands    Referring Provider (PT) Pollyann Savoy, MD    Hand Dominance Right      Balance Screen   Has the patient fallen in the past 6 months No    Has the patient had a decrease in activity level because of a fear of falling?  Yes    Is the patient reluctant to leave their home because of a fear of falling?  No   uses rollator     Home Environment   Additional Comments has stairs but does not do these, only cooks and cleans      Prior Function   Leisure spends time with bedridden husband      Cognition   Overall Cognitive Status Within Functional Limits for tasks assessed      Posture/Postural Control   Posture Comments Scoliotic, thoracic shifted left, right shoulder lower than left      ROM / Strength   AROM / PROM / Strength AROM;Strength      AROM   Overall AROM Comments Grossly limited lyumbar ROM 25%, Shoulder  ROM 75%. B knees -left knee -8 ext to 90 , R knee - 10 ext to 105 flex      Flexibility   Soft Tissue Assessment /Muscle Length yes    Hamstrings tight B    Quadriceps tight B      Transfers   Five time sit to stand comments  25 seconds with knee pain and UE support, little control      Ambulation/Gait   Gait Comments Slow, antalgic without device      Standardized Balance Assessment   Standardized Balance Assessment Timed Up and Go Test;Berg Balance Test;Dynamic Gait Index      Berg Balance Test   Sit to Stand Able to stand  independently using hands    Standing Unsupported Able to stand safely 2 minutes    Sitting with Back Unsupported but Feet Supported on Floor or Stool Able to sit safely and securely 2 minutes    Stand to Sit Controls descent by using hands    Transfers Able to transfer safely, definite need of hands    Standing Unsupported with Eyes Closed Able to stand 10 seconds safely    Standing Unsupported with Feet Together Able to place feet together  independently and stand 1 minute safely    From Standing, Reach Forward with Outstretched Arm Can reach forward >12 cm safely (5")    From Standing Position, Pick up Object from Floor Able to pick up shoe safely and easily    From Standing Position, Turn to Look Behind Over each Shoulder Looks behind one side only/other side shows less weight shift    Turn 360 Degrees Able to turn 360 degrees safely but slowly    Standing Unsupported, Alternately Place Feet on Step/Stool Able to complete 4 steps without aid or supervision    Standing Unsupported, One Foot in Front Needs help to step but can hold 15 seconds    Standing on One Leg Tries to lift leg/unable to hold 3 seconds but remains standing independently    Total Score 41      Dynamic Gait Index   Level Surface Moderate Impairment    Change in Gait Speed Moderate Impairment    Gait with Horizontal Head Turns Moderate Impairment    Gait with Vertical Head Turns Moderate Impairment    Gait and Pivot Turn Moderate Impairment    Step Over Obstacle Moderate Impairment    Step Around Obstacles Moderate Impairment    Steps Moderate Impairment    Total Score 8      Timed Up and Go Test   Normal TUG (seconds) 17   no device, slow                       Objective measurements completed on examination: See above findings.               PT Education - 01/07/21 1130     Education Details PT initial POC and HEP.  Access Code: [facility]  URL: [email or web address]  Date: 01/07/2021  Prepared by: Claude Manges    Exercises  Seated Marching with Opposite Shoulder Flexion - 1 x daily - 7 x weekly - 2 sets - 10 reps  Seated Long Arc Quad - 1 x daily - 7 x weekly - 2 sets - 10 reps  Sit to Stand with Counter Support - 1 x daily - 7 x weekly - 5 sets - 5 reps  Seated Scapular Retraction - 1 x daily -  7 x weekly - 3 sets - 10 reps  Ice - 1 x daily - 7 x weekly - 10-20 minute hold  Seated Lumbar Flexion Stretch - 1 x  daily - 7 x weekly - 3 sets - 10 reps  Seated Diaphragmatic Breathing - 2 x daily - 7 x weekly - 5 reps    Person(s) Educated Patient    Methods Explanation;Demonstration;Handout    Comprehension Need further instruction;Verbal cues required;Returned demonstration;Verbalized understanding              PT Short Term Goals - 01/07/21 1720       PT SHORT TERM GOAL #1   Title independent with initial HEP    Time 2    Period Weeks    Status New    Target Date 01/21/21               PT Long Term Goals - 01/07/21 1720       PT LONG TERM GOAL #1   Title independent and safe with advanced HEP for maintenance of strength and falls prevention at home    Time 8    Period Weeks    Status New    Target Date 03/04/21      PT LONG TERM GOAL #2   Title Improve 5TSTS to </= 12 seconds to demonstrate improved functional strength    Baseline 25 seconds with UE assist and poor control, incr pain    Time 8    Period Weeks    Status New    Target Date 03/04/21      PT LONG TERM GOAL #3   Title report pain 50% less with cooking    Time 8    Period Weeks    Status New    Target Date 03/04/21      PT LONG TERM GOAL #4   Title DGI improved to at least 19/24 to demonstrate decreased falls risk    Time 8    Period Weeks    Status New    Target Date 03/04/21                    Plan - 01/07/21 1131     Clinical Impression Statement Pt is a 87 yo female with B knee OA, lumbar pain and chronic L shoudler pain who returns to this clinic for physical therapy evaluation. She presents with c/o primarily low back pain worse today, knee and shoulder pain moreso with motions. She had a recent flareup of low back pain that radiated to front of abdomen that warranted an ED visit earlier this month. Denies N/T and B/B changes. She presents with grossly limited shoulder, back and knee ROM, decreased functional strength, and diminished balance. Berg balance score slighlty less than  previous, and DGI assessed revelaing high fall risk at this time. She will benefit from skilled PT to address aformentioned impairments, decrease pain, improve functional mobility, and decrease falls risk.    Stability/Clinical Decision Making Stable/Uncomplicated    Clinical Decision Making Low    Rehab Potential Good    PT Frequency 2x / week    PT Duration 8 weeks    PT Treatment/Interventions ADLs/Self Care Home Management;Cryotherapy;Moist Heat;Electrical Stimulation;Gait training;Therapeutic exercise;Therapeutic activities;Functional mobility training;Balance training;Neuromuscular re-education;Patient/family education;Manual techniques    PT Next Visit Plan Functional back/core, UE/LE strength and flexibility. dynamic balance challenges as tolerated. manual & modalities as needed.    PT Home Exercise Plan see pt instructions    Consulted  and Agree with Plan of Care Patient             Patient will benefit from skilled therapeutic intervention in order to improve the following deficits and impairments:  Decreased balance, Abnormal gait, Decreased strength, Impaired flexibility, Postural dysfunction, Pain, Decreased range of motion  Visit Diagnosis: Other abnormalities of gait and mobility - Plan: PT plan of care cert/re-cert  Acute bilateral low back pain without sciatica - Plan: PT plan of care cert/re-cert  Left shoulder pain, unspecified chronicity - Plan: PT plan of care cert/re-cert  Chronic pain of left knee - Plan: PT plan of care cert/re-cert  Chronic pain of right knee - Plan: PT plan of care cert/re-cert     Problem List Patient Active Problem List   Diagnosis Date Noted   DDD (degenerative disc disease), thoracic 11/06/2020   Other idiopathic scoliosis, thoracolumbar region 11/06/2020   DDD (degenerative disc disease), lumbar 11/06/2020   Age-related osteoporosis without current pathological fracture 11/06/2020   Porokeratosis 11/26/2018   Corns and  callosities 11/26/2018   History of right shoulder replacement 10/07/2016   Chronic pain of both knees 05/20/2016   Osteoarthritis of both hands 05/20/2016   Essential hypertension 05/20/2016   Environmental allergies 05/20/2016   Primary osteoarthritis of both knees 05/19/2016   Callus of foot 11/24/2012   Pain in joint, ankle and foot 09/15/2012    Anson Crofts, PT, DPT 01/07/2021, 5:32 PM  [facility] [address] [place], [name], [address] Phone: [phone]   Fax:  [phone]  Name: Danielle Dean MRN: [id number] Date of Birth: 1928/12/16

## 2021-01-08 ENCOUNTER — Other Ambulatory Visit (HOSPITAL_COMMUNITY): Payer: Self-pay

## 2021-01-08 NOTE — Telephone Encounter (Signed)
Prolia received in the office via [facility] courier and placed in fridge #2.

## 2021-01-09 ENCOUNTER — Other Ambulatory Visit: Payer: Self-pay | Admitting: *Deleted

## 2021-01-09 DIAGNOSIS — Z5181 Encounter for therapeutic drug level monitoring: Secondary | ICD-10-CM

## 2021-01-09 LAB — COMPLETE METABOLIC PANEL WITH GFR
AG Ratio: 1.7 (calc) (ref 1.0–2.5)
ALT: 12 U/L (ref 6–29)
AST: 18 U/L (ref 10–35)
Albumin: 3.9 g/dL (ref 3.6–5.1)
Alkaline phosphatase (APISO): 59 U/L (ref 37–153)
BUN: 20 mg/dL (ref 7–25)
CO2: 25 mmol/L (ref 20–32)
Calcium: 9.1 mg/dL (ref 8.6–10.4)
Chloride: 94 mmol/L — ABNORMAL LOW (ref 98–110)
Creat: 0.78 mg/dL (ref 0.60–0.95)
Globulin: 2.3 g/dL (calc) (ref 1.9–3.7)
Glucose, Bld: 91 mg/dL (ref 65–99)
Potassium: 4.9 mmol/L (ref 3.5–5.3)
Sodium: 127 mmol/L — ABNORMAL LOW (ref 135–146)
Total Bilirubin: 0.6 mg/dL (ref 0.2–1.2)
Total Protein: 6.2 g/dL (ref 6.1–8.1)
eGFR: 72 mL/min/{1.73_m2} (ref >=60)

## 2021-01-10 NOTE — Telephone Encounter (Signed)
Patient scheduled for Prolia on 01/16/21 @ 10:30am. Rx received from [facility] and is in refrigerator  Routing to Dulcy Fanny, LPN, as Katheren Shams, PharmD, MPH, BCPS Clinical Pharmacist (Rheumatology and Pulmonology)

## 2021-01-10 NOTE — Progress Notes (Signed)
Sodium and chloride are low and stable.

## 2021-01-15 ENCOUNTER — Other Ambulatory Visit: Payer: Self-pay

## 2021-01-15 ENCOUNTER — Encounter: Payer: Self-pay | Admitting: Physical Therapy

## 2021-01-15 ENCOUNTER — Ambulatory Visit: Payer: Medicare Other | Attending: Rheumatology | Admitting: Physical Therapy

## 2021-01-15 DIAGNOSIS — M545 Low back pain, unspecified: Secondary | ICD-10-CM | POA: Insufficient documentation

## 2021-01-15 DIAGNOSIS — G8929 Other chronic pain: Secondary | ICD-10-CM | POA: Insufficient documentation

## 2021-01-15 DIAGNOSIS — R2689 Other abnormalities of gait and mobility: Secondary | ICD-10-CM

## 2021-01-15 DIAGNOSIS — R262 Difficulty in walking, not elsewhere classified: Secondary | ICD-10-CM | POA: Insufficient documentation

## 2021-01-15 DIAGNOSIS — M25561 Pain in right knee: Secondary | ICD-10-CM | POA: Diagnosis present

## 2021-01-15 DIAGNOSIS — M25512 Pain in left shoulder: Secondary | ICD-10-CM | POA: Diagnosis present

## 2021-01-15 DIAGNOSIS — M25562 Pain in left knee: Secondary | ICD-10-CM | POA: Insufficient documentation

## 2021-01-15 NOTE — Therapy (Signed)
[facility] [address] [place], [name], [address] Phone: [phone]   Fax:  [phone]  Physical Therapy Treatment  Patient Details  Name: Danielle Dean MRN: [id number] Date of Birth: [birth year] Referring Provider (PT): Pollyann Savoy, MD   Encounter Date: 01/15/2021   PT End of Session - 01/15/21 1059     Visit Number 2    Date for PT Re-Evaluation 03/04/21    PT Start Time 1015    PT Stop Time 1059    PT Time Calculation (min) 44 min    Activity Tolerance Patient tolerated treatment well;Patient limited by pain    Behavior During Therapy [facility] for tasks assessed/performed             Past Medical History:  Diagnosis Date   Arthritis    Callus    Cataract fragments in both eyes following surgery    Corns and callosities    Hx of hysterectomy    Hypertension    Osteoarthritis     Past Surgical History:  Procedure Laterality Date   ABDOMINAL HYSTERECTOMY     NASAL SEPTUM SURGERY     ROTATOR CUFF REPAIR Right    TOTAL SHOULDER ARTHROPLASTY      There were no vitals filed for this visit.   Subjective Assessment - 01/15/21 1017     Subjective Feels that one of the stretches gave her pain. When she sits she does not have pain, worse when standing for a couple hours.    Pertinent History osteoporosis    Patient Stated Goals less pain. no falls    Currently in Pain? No/denies                               [facility] Adult PT Treatment/Exercise - 01/15/21 0001       Neuro Re-ed    Neuro Re-ed Details  romberg EO/EC 2x30", R/L tandem balance 30", tandem walk   intermittent HHA     Exercises   Exercises Lumbar;Knee/Hip      Lumbar Exercises: Stretches   Other Lumbar Stretch Exercise prayer stretch with pball 5x3" each forward/R/L      Lumbar Exercises: Aerobic   Nustep L4 x 6 min (UEs/LEs)      Lumbar Exercises: Machines for Strengthening   Other Lumbar Machine Exercise cybex  row 10x 10#, lat pulldown 10x 10#      Lumbar Exercises: Seated   Sit to Stand 5 reps    Sit to Stand Limitations 2x5 with red TB above knees   good form; R knee pain   Other Seated Lumbar Exercises R/L HS curl with green TB 10x    Other Seated Lumbar Exercises isometric ab set 10x5"   cues to maintain rhythmic breathing and elbows straight                   PT Education - 01/15/21 1059     Education Details update to HEP    Person(s) Educated Patient    Methods Explanation;Demonstration;Tactile cues;Verbal cues;Handout    Comprehension Verbalized understanding;Returned demonstration              PT Short Term Goals - 01/15/21 1101       PT SHORT TERM GOAL #1   Title independent with initial HEP    Time 2    Period Weeks    Status Achieved    Target Date 01/21/21  PT Long Term Goals - 01/15/21 1108       PT LONG TERM GOAL #1   Title independent and safe with advanced HEP for maintenance of strength and falls prevention at home    Time 8    Period Weeks    Status On-going      PT LONG TERM GOAL #2   Title Improve 5TSTS to </= 12 seconds to demonstrate improved functional strength    Baseline 25 seconds with UE assist and poor control, incr pain    Time 8    Period Weeks    Status On-going      PT LONG TERM GOAL #3   Title report pain 50% less with cooking    Time 8    Period Weeks    Status On-going      PT LONG TERM GOAL #4   Title DGI improved to at least 19/24 to demonstrate decreased falls risk    Time 8    Period Weeks    Status On-going                   Plan - 01/15/21 1100     Clinical Impression Statement Patient arrived to session with report of increased LBP d/t one of her exercises. Upon discussion, patient identified sitting lumbar flexion as the exercise that increased pain, thus tried prayer stretch with exercise ball instead. Patient reported very mild R shoulder and LB discomfort with these  stretches, thus updated this into HEP for further practice- patient reported understanding. Worked on STS transfers with lateral hip bias with patient demonstrating good form, however noted R lateral knee pain. Machine strengthening was performing without complaints. Remainder of session was spent working on stability and proprioceptive training with patient demonstrating most imbalance with tandem balance. Patient reported understanding of HEP update and without complaints at end of session.    Stability/Clinical Decision Making Stable/Uncomplicated    Rehab Potential Good    PT Frequency 2x / week    PT Duration 8 weeks    PT Treatment/Interventions ADLs/Self Care Home Management;Cryotherapy;Moist Heat;Electrical Stimulation;Gait training;Therapeutic exercise;Therapeutic activities;Functional mobility training;Balance training;Neuromuscular re-education;Patient/family education;Manual techniques    PT Next Visit Plan Functional back/core, UE/LE strength and flexibility. dynamic balance challenges as tolerated. manual & modalities as needed.    PT Home Exercise Plan see pt instructions    Consulted and Agree with Plan of Care Patient             Patient will benefit from skilled therapeutic intervention in order to improve the following deficits and impairments:  Decreased balance, Abnormal gait, Decreased strength, Impaired flexibility, Postural dysfunction, Pain, Decreased range of motion  Visit Diagnosis: Other abnormalities of gait and mobility  Acute bilateral low back pain without sciatica  Left shoulder pain, unspecified chronicity  Chronic pain of left knee  Chronic pain of right knee  Difficulty in walking, not elsewhere classified     Problem List Patient Active Problem List   Diagnosis Date Noted   DDD (degenerative disc disease), thoracic 11/06/2020   Other idiopathic scoliosis, thoracolumbar region 11/06/2020   DDD (degenerative disc disease), lumbar 11/06/2020    Age-related osteoporosis without current pathological fracture 11/06/2020   Porokeratosis 11/26/2018   Corns and callosities 11/26/2018   History of right shoulder replacement 10/07/2016   Chronic pain of both knees 05/20/2016   Osteoarthritis of both hands 05/20/2016   Essential hypertension 05/20/2016   Environmental allergies 05/20/2016   Primary osteoarthritis of both knees 05/19/2016  Callus of foot 11/24/2012   Pain in joint, ankle and foot 09/15/2012     Anette Guarneri, PT, DPT 01/15/21 11:51 AM    [facility] [address] [place], [name], [address] Phone: [phone]   Fax:  [phone]  Name: KEMANI HEIDEL MRN: [id number] Date of Birth: July 23, 1928

## 2021-01-16 ENCOUNTER — Ambulatory Visit (INDEPENDENT_AMBULATORY_CARE_PROVIDER_SITE_OTHER): Payer: Medicare Other | Admitting: *Deleted

## 2021-01-16 ENCOUNTER — Telehealth: Payer: Self-pay | Admitting: *Deleted

## 2021-01-16 VITALS — BP 161/66 | HR 55

## 2021-01-16 DIAGNOSIS — M810 Age-related osteoporosis without current pathological fracture: Secondary | ICD-10-CM

## 2021-01-16 MED ORDER — DENOSUMAB 60 MG/ML ~~LOC~~ SOSY
60.0000 mg | PREFILLED_SYRINGE | Freq: Once | SUBCUTANEOUS | Status: AC
  Administered 2021-01-16: 60 mg via SUBCUTANEOUS

## 2021-01-16 NOTE — Progress Notes (Signed)
Pharmacy Note  Subjective:   Patient presents to clinic today to receive bi-annual dose of Prolia.  Patient running a fever or have signs/symptoms of infection? No  Patient currently on antibiotics for the treatment of infection? No  Patient had fall in the last 6 months?  No  If yes, did it require medical attention? No   Patient taking calcium 1200 mg daily through diet or supplement and at least 800 units vitamin D? Yes  Objective: CMP     Component Value Date/Time   NA 127 (L) 01/09/2021 1025   K 4.9 01/09/2021 1025   CL 94 (L) 01/09/2021 1025   CO2 25 01/09/2021 1025   GLUCOSE 91 01/09/2021 1025   BUN 20 01/09/2021 1025   CREATININE 0.78 01/09/2021 1025   CALCIUM 9.1 01/09/2021 1025   PROT 6.2 01/09/2021 1025   ALBUMIN 3.6 12/20/2020 1422   AST 18 01/09/2021 1025   ALT 12 01/09/2021 1025   ALKPHOS 50 12/20/2020 1422   BILITOT 0.6 01/09/2021 1025   GFRNONAA >60 12/20/2020 1422   GFRNONAA 70 06/14/2020 0000   GFRAA 81 06/14/2020 0000    CBC    Component Value Date/Time   WBC 6.4 12/20/2020 1422   RBC 3.68 (L) 12/20/2020 1422   HGB 11.1 (L) 12/20/2020 1422   HCT 33.4 (L) 12/20/2020 1422   PLT 177 12/20/2020 1422   MCV 90.8 12/20/2020 1422   MCH 30.2 12/20/2020 1422   MCHC 33.2 12/20/2020 1422   RDW 12.4 12/20/2020 1422   LYMPHSABS 1.5 12/20/2020 1422   MONOABS 0.8 12/20/2020 1422   EOSABS 0.1 12/20/2020 1422   BASOSABS 0.0 12/20/2020 1422    Lab Results  Component Value Date   VD25OH 56 06/14/2020    T-score: -4.6.  Assessment/Plan:   Administrations This Visit     denosumab (PROLIA) injection 60 mg     Admin Date 01/16/2021 Action Given Dose 60 mg Route Subcutaneous Administered By Henriette Combs, LPN             Patient tolerated injection well.    All questions encouraged and answered.  Instructed patient to call with any further questions or concerns.

## 2021-01-16 NOTE — Telephone Encounter (Signed)
Her liver functions and kidney functions are normal.  She may continue Tylenol.

## 2021-01-16 NOTE — Telephone Encounter (Signed)
Patient advised her liver functions and kidney functions are normal.  She may continue Tylenol. Patient expressed understanding.

## 2021-01-16 NOTE — Telephone Encounter (Signed)
Patient in office today for a Prolia injection. Patient states she has continued to have back pain on her right side. Patient states she has been taking Tylenol daily in the morning. Patient would like to know if she can continue to take it daily for her back. Patient states she is currently doing physical therapy. Please advise.

## 2021-01-17 ENCOUNTER — Ambulatory Visit: Payer: Medicare Other | Admitting: Physical Therapy

## 2021-01-17 ENCOUNTER — Other Ambulatory Visit: Payer: Self-pay

## 2021-01-17 ENCOUNTER — Encounter: Payer: Self-pay | Admitting: Physical Therapy

## 2021-01-17 DIAGNOSIS — G8929 Other chronic pain: Secondary | ICD-10-CM

## 2021-01-17 DIAGNOSIS — M25562 Pain in left knee: Secondary | ICD-10-CM

## 2021-01-17 DIAGNOSIS — R2689 Other abnormalities of gait and mobility: Secondary | ICD-10-CM | POA: Diagnosis not present

## 2021-01-17 DIAGNOSIS — M545 Low back pain, unspecified: Secondary | ICD-10-CM

## 2021-01-17 DIAGNOSIS — M25512 Pain in left shoulder: Secondary | ICD-10-CM

## 2021-01-17 DIAGNOSIS — R262 Difficulty in walking, not elsewhere classified: Secondary | ICD-10-CM

## 2021-01-17 NOTE — Therapy (Signed)
[facility] [address] [place], [name], [address] Phone: [phone]   Fax:  [phone]  Physical Therapy Treatment  Patient Details  Name: Danielle Dean MRN: [id number] Date of Birth: 10-13-1928 Referring Provider (PT): Pollyann Savoy, MD   Encounter Date: 01/17/2021   PT End of Session - 01/17/21 1054     Visit Number 3    Date for PT Re-Evaluation 03/04/21    PT Start Time 1015    PT Stop Time 1058    PT Time Calculation (min) 43 min    Activity Tolerance Patient tolerated treatment well;Patient limited by fatigue;Patient limited by pain    Behavior During Therapy [facility] for tasks assessed/performed             Past Medical History:  Diagnosis Date   Arthritis    Callus    Cataract fragments in both eyes following surgery    Corns and callosities    Hx of hysterectomy    Hypertension    Osteoarthritis     Past Surgical History:  Procedure Laterality Date   ABDOMINAL HYSTERECTOMY     NASAL SEPTUM SURGERY     ROTATOR CUFF REPAIR Right    TOTAL SHOULDER ARTHROPLASTY      There were no vitals filed for this visit.   Subjective Assessment - 01/17/21 1017     Subjective Doing well. Had her Prolia injection yesterday. Took a 15-20 min walk with her rollator this AM.    Pertinent History osteoporosis    Patient Stated Goals less pain. no falls    Currently in Pain? Yes    Pain Score 6     Pain Location Back    Pain Orientation Right    Pain Descriptors / Indicators Sharp    Pain Type Chronic pain                               [facility] Adult PT Treatment/Exercise - 01/17/21 0001       Ambulation/Gait   Gait Comments 4x on small clinic stairs- reciprocal alternating stair navigation with B UE support   c/o oderate pain     Lumbar Exercises: Aerobic   Nustep L5 x 6 min (UEs/LEs)      Lumbar Exercises: Machines for Strengthening   Other Lumbar Machine Exercise cybex row 2x10 5#,  lat pulldown x10 10#   decreased # on rows from 10# d/t difficulty today     Knee/Hip Exercises: Standing   Other Standing Knee Exercises sidestepping with yellow loop above knees 2x10ft with CGA/HHA      Knee/Hip Exercises: Seated   Clamshell with TheraBand Yellow   2x10                Balance Exercises - 01/17/21 0001       Balance Exercises: Standing   Tandem Gait Forward;4 reps   with varying UE support   Retro Gait --   x6 in II bars; cueing ot increase step length   Other Standing Exercises alt toe tap on 6" step 2x20 without UE support    Other Standing Exercises Comments R/L stepping fwd/back over cone in II bars 7x each               PT Education - 01/17/21 1059     Education Details answering patient's questions on if back brace may be helpful for her and educated pt on importance  of core and hip strengthening for intrinsic stability    Person(s) Educated Patient    Methods Explanation    Comprehension Verbalized understanding              PT Short Term Goals - 01/15/21 1101       PT SHORT TERM GOAL #1   Title independent with initial HEP    Time 2    Period Weeks    Status Achieved    Target Date 01/21/21               PT Long Term Goals - 01/15/21 1108       PT LONG TERM GOAL #1   Title independent and safe with advanced HEP for maintenance of strength and falls prevention at home    Time 8    Period Weeks    Status On-going      PT LONG TERM GOAL #2   Title Improve 5TSTS to </= 12 seconds to demonstrate improved functional strength    Baseline 25 seconds with UE assist and poor control, incr pain    Time 8    Period Weeks    Status On-going      PT LONG TERM GOAL #3   Title report pain 50% less with cooking    Time 8    Period Weeks    Status On-going      PT LONG TERM GOAL #4   Title DGI improved to at least 19/24 to demonstrate decreased falls risk    Time 8    Period Weeks    Status On-going                    Plan - 01/17/21 1058     Clinical Impression Statement Patient arrived to session without complaints. Notes that she was able to take a 15-20 min walk with her rollator this AM, thus resulting in 5/10 LBP at start of session. Worked on dynamic balance exercises in parallel bars today with challenges including SLS, backwards walking, and compliant surfaces. Patient required cueing to maintain upright trunk and increase step length with activities. Was able to demonstrate reciprocal alternating stair navigation with B UE support on clinic stairs today, albeit with moderate B knee pain. Remainder of session was spent on core and LE strengthening. Patient reported fatigue at end of session, otherwise without complaints.    Stability/Clinical Decision Making Stable/Uncomplicated    Rehab Potential Good    PT Frequency 2x / week    PT Duration 8 weeks    PT Treatment/Interventions ADLs/Self Care Home Management;Cryotherapy;Moist Heat;Electrical Stimulation;Gait training;Therapeutic exercise;Therapeutic activities;Functional mobility training;Balance training;Neuromuscular re-education;Patient/family education;Manual techniques    PT Next Visit Plan Functional back/core, UE/LE strength and flexibility. dynamic balance challenges as tolerated. manual & modalities as needed.    PT Home Exercise Plan see pt instructions    Consulted and Agree with Plan of Care Patient             Patient will benefit from skilled therapeutic intervention in order to improve the following deficits and impairments:  Decreased balance, Abnormal gait, Decreased strength, Impaired flexibility, Postural dysfunction, Pain, Decreased range of motion  Visit Diagnosis: Other abnormalities of gait and mobility  Acute bilateral low back pain without sciatica  Left shoulder pain, unspecified chronicity  Chronic pain of left knee  Chronic pain of right knee  Difficulty in walking, not elsewhere  classified     Problem List Patient Active Problem List  Diagnosis Date Noted   DDD (degenerative disc disease), thoracic 11/06/2020   Other idiopathic scoliosis, thoracolumbar region 11/06/2020   DDD (degenerative disc disease), lumbar 11/06/2020   Age-related osteoporosis without current pathological fracture 11/06/2020   Porokeratosis 11/26/2018   Corns and callosities 11/26/2018   History of right shoulder replacement 10/07/2016   Chronic pain of both knees 05/20/2016   Osteoarthritis of both hands 05/20/2016   Essential hypertension 05/20/2016   Environmental allergies 05/20/2016   Primary osteoarthritis of both knees 05/19/2016   Callus of foot 11/24/2012   Pain in joint, ankle and foot 09/15/2012    Anette Guarneri, PT, DPT 01/17/21 11:00 AM    [facility] [address] [place], [name], [address] Phone: [phone]   Fax:  [phone]  Name: Danielle Dean MRN: [id number] Date of Birth: 1928-07-19

## 2021-01-22 ENCOUNTER — Ambulatory Visit: Payer: Medicare Other

## 2021-01-22 ENCOUNTER — Other Ambulatory Visit: Payer: Self-pay

## 2021-01-22 DIAGNOSIS — M25561 Pain in right knee: Secondary | ICD-10-CM

## 2021-01-22 DIAGNOSIS — R2689 Other abnormalities of gait and mobility: Secondary | ICD-10-CM | POA: Diagnosis not present

## 2021-01-22 DIAGNOSIS — M25512 Pain in left shoulder: Secondary | ICD-10-CM

## 2021-01-22 DIAGNOSIS — G8929 Other chronic pain: Secondary | ICD-10-CM

## 2021-01-22 DIAGNOSIS — M545 Low back pain, unspecified: Secondary | ICD-10-CM

## 2021-01-22 DIAGNOSIS — R262 Difficulty in walking, not elsewhere classified: Secondary | ICD-10-CM

## 2021-01-22 NOTE — Therapy (Signed)
[facility] [address] [place], [name], [address] Phone: [phone]   Fax:  [phone]  Physical Therapy Treatment  Patient Details  Name: Danielle Dean MRN: [id number] Date of Birth: 26-Jun-1928 Referring Provider (PT): Pollyann Savoy, MD   Encounter Date: 01/22/2021   PT End of Session - 01/22/21 1050     Visit Number 4    Date for PT Re-Evaluation 03/04/21    PT Start Time 1045    PT Stop Time 1125    PT Time Calculation (min) 40 min    Activity Tolerance Patient tolerated treatment well;Patient limited by fatigue;Patient limited by pain    Behavior During Therapy [facility] for tasks assessed/performed             Past Medical History:  Diagnosis Date   Arthritis    Callus    Cataract fragments in both eyes following surgery    Corns and callosities    Hx of hysterectomy    Hypertension    Osteoarthritis     Past Surgical History:  Procedure Laterality Date   ABDOMINAL HYSTERECTOMY     NASAL SEPTUM SURGERY     ROTATOR CUFF REPAIR Right    TOTAL SHOULDER ARTHROPLASTY      There were no vitals filed for this visit.   Subjective Assessment - 01/22/21 1048     Subjective Doing well, same pain in knees/back.    Pertinent History osteoporosis    Patient Stated Goals less pain. no falls    Currently in Pain? Yes    Pain Score 6     Pain Location Back                               [facility] Adult PT Treatment/Exercise - 01/22/21 0001       Lumbar Exercises: Aerobic   Nustep L5 x 6 min (UEs/LEs)      Lumbar Exercises: Machines for Strengthening   Other Lumbar Machine Exercise cybex row x10 at #10 , then x 10 at 5#, lat pulldown x10 10#      Knee/Hip Exercises: Standing   Other Standing Knee Exercises stanidng shoulder ext yellow TB 2 x 10      Knee/Hip Exercises: Seated   Clamshell with TheraBand Yellow   2x10   Knee/Hip Flexion --    Marching Limitations yellow TB 2 x 10 B alt     Abd/Adduction Limitations straight leg abd in sitting x 10 each    Sit to Sand 10 reps   2 sets, holding yellow wt ball                Balance Exercises - 01/22/21 0001       Balance Exercises: Standing   Standing Eyes Opened Narrow base of support (BOS);Foam/compliant surface;Head turns;30 secs   also with multidirecitonal reaches   Tandem Gait Forward;4 reps   with varying UE support   Retro Gait 5 reps    Sidestepping 5 reps    Other Standing Exercises alt toe tap on 6" step 2x20 without UE support    Other Standing Exercises Comments marches forward x 5 laps along EOM                 PT Short Term Goals - 01/15/21 1101       PT SHORT TERM GOAL #1   Title independent with initial HEP    Time 2  Period Weeks    Status Achieved    Target Date 01/21/21               PT Long Term Goals - 01/15/21 1108       PT LONG TERM GOAL #1   Title independent and safe with advanced HEP for maintenance of strength and falls prevention at home    Time 8    Period Weeks    Status On-going      PT LONG TERM GOAL #2   Title Improve 5TSTS to </= 12 seconds to demonstrate improved functional strength    Baseline 25 seconds with UE assist and poor control, incr pain    Time 8    Period Weeks    Status On-going      PT LONG TERM GOAL #3   Title report pain 50% less with cooking    Time 8    Period Weeks    Status On-going      PT LONG TERM GOAL #4   Title DGI improved to at least 19/24 to demonstrate decreased falls risk    Time 8    Period Weeks    Status On-going                   Plan - 01/22/21 1128     Clinical Impression Statement Pt tolerated all interventions well today although with some datigue. Continued balance and strength exercises. Most dififculty noted with tandem gait requiring 1-2 UE support. Required cueing for full extension with STS. Plan to continue per POC    Stability/Clinical Decision Making Stable/Uncomplicated     Rehab Potential Good    PT Frequency 2x / week    PT Duration 8 weeks    PT Treatment/Interventions ADLs/Self Care Home Management;Cryotherapy;Moist Heat;Electrical Stimulation;Gait training;Therapeutic exercise;Therapeutic activities;Functional mobility training;Balance training;Neuromuscular re-education;Patient/family education;Manual techniques    PT Next Visit Plan Functional back/core, UE/LE strength and flexibility. dynamic balance challenges as tolerated. manual & modalities as needed.    PT Home Exercise Plan see pt instructions    Consulted and Agree with Plan of Care Patient             Patient will benefit from skilled therapeutic intervention in order to improve the following deficits and impairments:  Decreased balance, Abnormal gait, Decreased strength, Impaired flexibility, Postural dysfunction, Pain, Decreased range of motion  Visit Diagnosis: Other abnormalities of gait and mobility  Acute bilateral low back pain without sciatica  Left shoulder pain, unspecified chronicity  Chronic pain of left knee  Chronic pain of right knee  Difficulty in walking, not elsewhere classified     Problem List Patient Active Problem List   Diagnosis Date Noted   DDD (degenerative disc disease), thoracic 11/06/2020   Other idiopathic scoliosis, thoracolumbar region 11/06/2020   DDD (degenerative disc disease), lumbar 11/06/2020   Age-related osteoporosis without current pathological fracture 11/06/2020   Porokeratosis 11/26/2018   Corns and callosities 11/26/2018   History of right shoulder replacement 10/07/2016   Chronic pain of both knees 05/20/2016   Osteoarthritis of both hands 05/20/2016   Essential hypertension 05/20/2016   Environmental allergies 05/20/2016   Primary osteoarthritis of both knees 05/19/2016   Callus of foot 11/24/2012   Pain in joint, ankle and foot 09/15/2012    Anson Crofts, PT, DPT 01/22/2021, 11:30 AM  [facility] [address] [place], [name], [address] Phone: [phone]   Fax:  [phone]  Name:  Danielle Dean MRN: [id number] Date of Birth: [birth year]

## 2021-01-24 ENCOUNTER — Ambulatory Visit: Payer: Medicare Other | Admitting: Physical Therapy

## 2021-01-24 ENCOUNTER — Other Ambulatory Visit: Payer: Self-pay

## 2021-01-24 ENCOUNTER — Encounter: Payer: Self-pay | Admitting: Physical Therapy

## 2021-01-24 DIAGNOSIS — M25562 Pain in left knee: Secondary | ICD-10-CM

## 2021-01-24 DIAGNOSIS — R2689 Other abnormalities of gait and mobility: Secondary | ICD-10-CM

## 2021-01-24 DIAGNOSIS — G8929 Other chronic pain: Secondary | ICD-10-CM

## 2021-01-24 DIAGNOSIS — M25512 Pain in left shoulder: Secondary | ICD-10-CM

## 2021-01-24 DIAGNOSIS — M545 Low back pain, unspecified: Secondary | ICD-10-CM

## 2021-01-24 DIAGNOSIS — M25561 Pain in right knee: Secondary | ICD-10-CM

## 2021-01-24 DIAGNOSIS — R262 Difficulty in walking, not elsewhere classified: Secondary | ICD-10-CM

## 2021-01-24 NOTE — Therapy (Signed)
[facility] [address] [place], [name], [address] Phone: [phone]   Fax:  [phone]  Physical Therapy Treatment  Patient Details  Name: Danielle Dean MRN: [id number] Date of Birth: 12/27/1928 Referring Provider (PT): Pollyann Savoy, MD   Encounter Date: 01/24/2021   PT End of Session - 01/24/21 1100     Visit Number 5    Date for PT Re-Evaluation 03/04/21    PT Start Time 1018    PT Stop Time 1056    PT Time Calculation (min) 38 min    Activity Tolerance Patient tolerated treatment well;Patient limited by fatigue    Behavior During Therapy [facility] for tasks assessed/performed             Past Medical History:  Diagnosis Date   Arthritis    Callus    Cataract fragments in both eyes following surgery    Corns and callosities    Hx of hysterectomy    Hypertension    Osteoarthritis     Past Surgical History:  Procedure Laterality Date   ABDOMINAL HYSTERECTOMY     NASAL SEPTUM SURGERY     ROTATOR CUFF REPAIR Right    TOTAL SHOULDER ARTHROPLASTY      There were no vitals filed for this visit.   Subjective Assessment - 01/24/21 1020     Subjective Feeling okay right now. Took Tylenol this AM.    Pertinent History osteoporosis    Currently in Pain? Yes    Pain Score 6     Pain Location Back    Pain Orientation Right    Pain Descriptors / Indicators Sharp    Pain Type Chronic pain    Pain Score 6    Pain Location Shoulder    Pain Orientation Right    Pain Descriptors / Indicators Constant    Pain Type Chronic pain                               [facility] Adult PT Treatment/Exercise - 01/24/21 0001       Lumbar Exercises: Stretches   Other Lumbar Stretch Exercise pec stretch in corner 2x20"      Lumbar Exercises: Aerobic   Nustep L5 x 6 min (UEs/LEs)      Lumbar Exercises: Machines for Strengthening   Other Lumbar Machine Exercise cybex row x10 at #10 , then x 10 at 5#, lat  pulldown 2x10 10#      Lumbar Exercises: Standing   Row Strengthening;Both;10 reps;Theraband    Theraband Level (Row) Level 2 (Red);Level 3 (Green)    Row Limitations 2x10; cues to avoid excessive shoulder extension and promote scap retraction    Shoulder Extension Strengthening;Both;10 reps    Theraband Level (Shoulder Extension) Level 2 (Red)    Shoulder Extension Limitations 2x10   cueing to maintain elbows straight     Knee/Hip Exercises: Standing   Hip Abduction Stengthening;Right;Left;1 set;Knee straight    Abduction Limitations 1#; holding onto TM rail    Hip Extension Stengthening;Right;Left;1 set;10 reps;Knee straight    Extension Limitations 1#; holding onto TM rail                 Balance Exercises - 01/24/21 0001       Balance Exercises: Standing   Standing Eyes Opened 2 reps;30 secs;Narrow base of support (BOS)   2nd & 3rd set with multidirecitonal perturbations   Marching 20 reps  2x20 o foam with CGA                PT Short Term Goals - 01/15/21 1101       PT SHORT TERM GOAL #1   Title independent with initial HEP    Time 2    Period Weeks    Status Achieved    Target Date 01/21/21               PT Long Term Goals - 01/15/21 1108       PT LONG TERM GOAL #1   Title independent and safe with advanced HEP for maintenance of strength and falls prevention at home    Time 8    Period Weeks    Status On-going      PT LONG TERM GOAL #2   Title Improve 5TSTS to </= 12 seconds to demonstrate improved functional strength    Baseline 25 seconds with UE assist and poor control, incr pain    Time 8    Period Weeks    Status On-going      PT LONG TERM GOAL #3   Title report pain 50% less with cooking    Time 8    Period Weeks    Status On-going      PT LONG TERM GOAL #4   Title DGI improved to at least 19/24 to demonstrate decreased falls risk    Time 8    Period Weeks    Status On-going                   Plan -  01/24/21 1100     Clinical Impression Statement Patient arrived to session without new complaints. Worked on periscapular and core strengthening with patient required frequent correction of form. Patient required reminders to perform scapular retraction and upright posture throughout. Balance training was performed with CGA for safety throughout. Patient able to correct LOB against perturbations well. More challenge was demonstrated with dynamic balance challenges on foam. Patient noted L knee pain with hip strengthening, but able to continue despite this. Patient without complaints at end of session.    Stability/Clinical Decision Making Stable/Uncomplicated    Rehab Potential Good    PT Frequency 2x / week    PT Duration 8 weeks    PT Treatment/Interventions ADLs/Self Care Home Management;Cryotherapy;Moist Heat;Electrical Stimulation;Gait training;Therapeutic exercise;Therapeutic activities;Functional mobility training;Balance training;Neuromuscular re-education;Patient/family education;Manual techniques    PT Next Visit Plan Functional back/core, UE/LE strength and flexibility. dynamic balance challenges as tolerated. manual & modalities as needed.    PT Home Exercise Plan see pt instructions    Consulted and Agree with Plan of Care Patient             Patient will benefit from skilled therapeutic intervention in order to improve the following deficits and impairments:  Decreased balance, Abnormal gait, Decreased strength, Impaired flexibility, Postural dysfunction, Pain, Decreased range of motion  Visit Diagnosis: Other abnormalities of gait and mobility  Acute bilateral low back pain without sciatica  Left shoulder pain, unspecified chronicity  Chronic pain of left knee  Chronic pain of right knee  Difficulty in walking, not elsewhere classified     Problem List Patient Active Problem List   Diagnosis Date Noted   DDD (degenerative disc disease), thoracic 11/06/2020    Other idiopathic scoliosis, thoracolumbar region 11/06/2020   DDD (degenerative disc disease), lumbar 11/06/2020   Age-related osteoporosis without current pathological fracture 11/06/2020   Porokeratosis 11/26/2018   Corns  and callosities 11/26/2018   History of right shoulder replacement 10/07/2016   Chronic pain of both knees 05/20/2016   Osteoarthritis of both hands 05/20/2016   Essential hypertension 05/20/2016   Environmental allergies 05/20/2016   Primary osteoarthritis of both knees 05/19/2016   Callus of foot 11/24/2012   Pain in joint, ankle and foot 09/15/2012     Anette Guarneri, PT, DPT 01/24/21 11:06 AM    [facility] [address] [place], [name], [address] Phone: [phone]   Fax:  [phone]  Name: Danielle Dean MRN: [id number] Date of Birth: 1929/02/04

## 2021-01-29 ENCOUNTER — Ambulatory Visit: Payer: Medicare Other | Admitting: Physical Therapy

## 2021-01-29 ENCOUNTER — Other Ambulatory Visit: Payer: Self-pay

## 2021-01-29 ENCOUNTER — Encounter: Payer: Self-pay | Admitting: Physical Therapy

## 2021-01-29 DIAGNOSIS — M545 Low back pain, unspecified: Secondary | ICD-10-CM

## 2021-01-29 DIAGNOSIS — M25512 Pain in left shoulder: Secondary | ICD-10-CM

## 2021-01-29 DIAGNOSIS — R2689 Other abnormalities of gait and mobility: Secondary | ICD-10-CM | POA: Diagnosis not present

## 2021-01-29 DIAGNOSIS — M25562 Pain in left knee: Secondary | ICD-10-CM

## 2021-01-29 DIAGNOSIS — G8929 Other chronic pain: Secondary | ICD-10-CM

## 2021-01-29 DIAGNOSIS — R262 Difficulty in walking, not elsewhere classified: Secondary | ICD-10-CM

## 2021-01-29 NOTE — Therapy (Signed)
[facility] [address] [place], [name], [address] Phone: [phone]   Fax:  [phone]  Physical Therapy Treatment  Patient Details  Name: Danielle Dean MRN: [id number] Date of Birth: [birth year] Referring Provider (PT): Pollyann Savoy, MD   Encounter Date: 01/29/2021   PT End of Session - 01/29/21 1055     Visit Number 6    Date for PT Re-Evaluation 03/04/21    PT Start Time 1018    PT Stop Time 1057    PT Time Calculation (min) 39 min    Activity Tolerance Patient tolerated treatment well;Patient limited by pain    Behavior During Therapy [facility] for tasks assessed/performed             Past Medical History:  Diagnosis Date   Arthritis    Callus    Cataract fragments in both eyes following surgery    Corns and callosities    Hx of hysterectomy    Hypertension    Osteoarthritis     Past Surgical History:  Procedure Laterality Date   ABDOMINAL HYSTERECTOMY     NASAL SEPTUM SURGERY     ROTATOR CUFF REPAIR Right    TOTAL SHOULDER ARTHROPLASTY      There were no vitals filed for this visit.   Subjective Assessment - 01/29/21 1018     Subjective Tried to go on a walk this AM but could not d/t her knee pain.    Pertinent History osteoporosis    Patient Stated Goals less pain. no falls    Currently in Pain? Yes    Pain Score 6     Pain Location Back    Pain Orientation Right    Pain Descriptors / Indicators Sharp    Pain Type Chronic pain                               [facility] Adult PT Treatment/Exercise - 01/29/21 0001       Lumbar Exercises: Stretches   Other Lumbar Stretch Exercise pec stretch in corner 2x20"      Lumbar Exercises: Aerobic   Nustep L5 x 6 min (UEs/LEs)      Lumbar Exercises: Machines for Strengthening   Other Lumbar Machine Exercise cybex row 2x10 at 5#, lat pulldown 2x10 10#   verbal/tactile cues to avoid shoulder elevation     Lumbar Exercises: Seated    Other Seated Lumbar Exercises sitting on dynadisc with feet on airex B horizontal abduction with yellow TB 2x10, B ER AROM 2x10 no TB, marching 2x20, pelvic tilts x20, alt UE/LE lift 10x   manual cueing required to encourage anterior pelvic tilt; intermittent CGA/min A for balance     Knee/Hip Exercises: Standing   Hip Abduction Stengthening;Right;Left;1 set;Knee straight    Abduction Limitations 1#; holding onto II bar    Hip Extension Stengthening;Right;Left;1 set;10 reps;Knee straight    Extension Limitations 1#; holding onto II bar    Other Standing Knee Exercises sidestepping in II bars with yellow loop around ankles 6x   limited step length with red TB; cueing to aoid compensation                     PT Short Term Goals - 01/15/21 1101       PT SHORT TERM GOAL #1   Title independent with initial HEP    Time 2    Period Weeks  Status Achieved    Target Date 01/21/21               PT Long Term Goals - 01/15/21 1108       PT LONG TERM GOAL #1   Title independent and safe with advanced HEP for maintenance of strength and falls prevention at home    Time 8    Period Weeks    Status On-going      PT LONG TERM GOAL #2   Title Improve 5TSTS to </= 12 seconds to demonstrate improved functional strength    Baseline 25 seconds with UE assist and poor control, incr pain    Time 8    Period Weeks    Status On-going      PT LONG TERM GOAL #3   Title report pain 50% less with cooking    Time 8    Period Weeks    Status On-going      PT LONG TERM GOAL #4   Title DGI improved to at least 19/24 to demonstrate decreased falls risk    Time 8    Period Weeks    Status On-going                   Plan - 01/29/21 1055     Clinical Impression Statement Patient arrived to session with report of increased B knee pain; noted that she was unable to go on a walk this AM d/t pain. Worked on progressive sitting core strengthening and balance ther-ex today.  Patient demonstrated limited amplitude of movement with shoulder movements d/t weakness and hypomobility. Patient also required manual cues to encourage anterior pelvic tilt with good improvement in motor control and understanding after review. Intermittent CGA/min A for balance required with these sitting exercises today. Sitting rest breaks taken after periods of standing d/t pain. Hip strengthening revealed intermittent compensations that required tactile and verbal cues to correct. No complaints at end of session. Patient is progressing towards goals.    Stability/Clinical Decision Making Stable/Uncomplicated    Rehab Potential Good    PT Frequency 2x / week    PT Duration 8 weeks    PT Treatment/Interventions ADLs/Self Care Home Management;Cryotherapy;Moist Heat;Electrical Stimulation;Gait training;Therapeutic exercise;Therapeutic activities;Functional mobility training;Balance training;Neuromuscular re-education;Patient/family education;Manual techniques    PT Next Visit Plan Functional back/core, UE/LE strength and flexibility. dynamic balance challenges as tolerated. manual & modalities as needed.    PT Home Exercise Plan see pt instructions    Consulted and Agree with Plan of Care Patient             Patient will benefit from skilled therapeutic intervention in order to improve the following deficits and impairments:  Decreased balance, Abnormal gait, Decreased strength, Impaired flexibility, Postural dysfunction, Pain, Decreased range of motion  Visit Diagnosis: Other abnormalities of gait and mobility  Acute bilateral low back pain without sciatica  Left shoulder pain, unspecified chronicity  Chronic pain of left knee  Chronic pain of right knee  Difficulty in walking, not elsewhere classified     Problem List Patient Active Problem List   Diagnosis Date Noted   DDD (degenerative disc disease), thoracic 11/06/2020   Other idiopathic scoliosis, thoracolumbar region  11/06/2020   DDD (degenerative disc disease), lumbar 11/06/2020   Age-related osteoporosis without current pathological fracture 11/06/2020   Porokeratosis 11/26/2018   Corns and callosities 11/26/2018   History of right shoulder replacement 10/07/2016   Chronic pain of both knees 05/20/2016   Osteoarthritis of both  hands 05/20/2016   Essential hypertension 05/20/2016   Environmental allergies 05/20/2016   Primary osteoarthritis of both knees 05/19/2016   Callus of foot 11/24/2012   Pain in joint, ankle and foot 09/15/2012     Anette Guarneri, PT, DPT 01/29/21 10:59 AM    [facility] [place] [facility] [address] [place], [name], [address] Phone: [phone]   Fax:  [phone]  Name: Danielle Dean MRN: [id number] Date of Birth: 12-16-1928

## 2021-01-31 ENCOUNTER — Other Ambulatory Visit: Payer: Self-pay

## 2021-01-31 ENCOUNTER — Encounter: Payer: Self-pay | Admitting: Physical Therapy

## 2021-01-31 ENCOUNTER — Ambulatory Visit: Payer: Medicare Other | Admitting: Physical Therapy

## 2021-01-31 DIAGNOSIS — G8929 Other chronic pain: Secondary | ICD-10-CM

## 2021-01-31 DIAGNOSIS — R262 Difficulty in walking, not elsewhere classified: Secondary | ICD-10-CM

## 2021-01-31 DIAGNOSIS — R2689 Other abnormalities of gait and mobility: Secondary | ICD-10-CM

## 2021-01-31 DIAGNOSIS — M25512 Pain in left shoulder: Secondary | ICD-10-CM

## 2021-01-31 DIAGNOSIS — M25562 Pain in left knee: Secondary | ICD-10-CM

## 2021-01-31 DIAGNOSIS — M25561 Pain in right knee: Secondary | ICD-10-CM

## 2021-01-31 DIAGNOSIS — M545 Low back pain, unspecified: Secondary | ICD-10-CM

## 2021-01-31 NOTE — Therapy (Signed)
[facility] [address] [place], [name], [address] Phone: [phone]   Fax:  [phone]  Physical Therapy Treatment  Patient Details  Name: Danielle Dean MRN: [id number] Date of Birth: [birth year] Referring Provider (PT): Pollyann Savoy, MD   Encounter Date: 01/31/2021   PT End of Session - 01/31/21 1152     Visit Number 7    Date for PT Re-Evaluation 03/04/21    PT Start Time 1016    PT Stop Time 1059    PT Time Calculation (min) 43 min    Activity Tolerance Patient tolerated treatment well;Patient limited by pain    Behavior During Therapy [facility] for tasks assessed/performed             Past Medical History:  Diagnosis Date   Arthritis    Callus    Cataract fragments in both eyes following surgery    Corns and callosities    Hx of hysterectomy    Hypertension    Osteoarthritis     Past Surgical History:  Procedure Laterality Date   ABDOMINAL HYSTERECTOMY     NASAL SEPTUM SURGERY     ROTATOR CUFF REPAIR Right    TOTAL SHOULDER ARTHROPLASTY      There were no vitals filed for this visit.   Subjective Assessment - 01/31/21 1017     Subjective Doing fine.    Pertinent History osteoporosis    Patient Stated Goals less pain. no falls    Currently in Pain? Yes    Pain Score 6     Pain Location Knee    Pain Orientation Right;Left    Pain Descriptors / Indicators Aching    Pain Type Chronic pain                               [facility] Adult PT Treatment/Exercise - 01/31/21 0001       Lumbar Exercises: Aerobic   Nustep L5 x 6 min (UEs/LEs)      Lumbar Exercises: Machines for Strengthening   Other Lumbar Machine Exercise cybex row 2x15 at 5#, 6#, lat pulldown 2x10 10#      Knee/Hip Exercises: Seated   Sit to Sand 10 reps;without UE support   10x red TB above knees; 10x with ball squeeze;  cues to avoid "plop"                Balance Exercises - 01/31/21 0001        Balance Exercises: Standing   Other Standing Exercises normal BOS ball throw/catch on foam, with romberg ball throw/catch x5 min   no LOB   Other Standing Exercises Comments wt shifts fwd/back on foam 20x; wt shifts fwd/back on foam beam 20x; sidestepping on foam beam 4x   in II bars     Balance Exercises: Seated   Dynamic Sitting --   sitting on green pball pelvic tilts, alt LAQ, alt march 2 sets 10x each with min A d/t imbalance                PT Short Term Goals - 01/15/21 1101       PT SHORT TERM GOAL #1   Title independent with initial HEP    Time 2    Period Weeks    Status Achieved    Target Date 01/21/21               PT Long Term Goals -  01/15/21 1108       PT LONG TERM GOAL #1   Title independent and safe with advanced HEP for maintenance of strength and falls prevention at home    Time 8    Period Weeks    Status On-going      PT LONG TERM GOAL #2   Title Improve 5TSTS to </= 12 seconds to demonstrate improved functional strength    Baseline 25 seconds with UE assist and poor control, incr pain    Time 8    Period Weeks    Status On-going      PT LONG TERM GOAL #3   Title report pain 50% less with cooking    Time 8    Period Weeks    Status On-going      PT LONG TERM GOAL #4   Title DGI improved to at least 19/24 to demonstrate decreased falls risk    Time 8    Period Weeks    Status On-going                   Plan - 01/31/21 1152     Clinical Impression Statement Patient without new complaints today. Still reporting B knee pain. Worked on STS transfers with cues to improve eccentric control. Dynamic balance activities were performed on compliant surface. Patient with tendency to demonstrate LOB posteriorly, requiring reaching strategy to correct. However, intact ankle and hip strategy were observed despite this. Patient required sitting rest breaks in between standing activities d/t knee pain. Sitting dynamic core ther-ex was  performed on complaint surface with min A required d/t imbalance, partially d/t scoliosis and lateral trunk lean with posture at rest. Patient noted no complaints at end of session.    Stability/Clinical Decision Making Stable/Uncomplicated    Rehab Potential Good    PT Frequency 2x / week    PT Duration 8 weeks    PT Treatment/Interventions ADLs/Self Care Home Management;Cryotherapy;Moist Heat;Electrical Stimulation;Gait training;Therapeutic exercise;Therapeutic activities;Functional mobility training;Balance training;Neuromuscular re-education;Patient/family education;Manual techniques    PT Next Visit Plan Functional back/core, UE/LE strength and flexibility. dynamic balance challenges as tolerated. manual & modalities as needed.    PT Home Exercise Plan see pt instructions    Consulted and Agree with Plan of Care Patient             Patient will benefit from skilled therapeutic intervention in order to improve the following deficits and impairments:  Decreased balance, Abnormal gait, Decreased strength, Impaired flexibility, Postural dysfunction, Pain, Decreased range of motion  Visit Diagnosis: Other abnormalities of gait and mobility  Acute bilateral low back pain without sciatica  Left shoulder pain, unspecified chronicity  Chronic pain of left knee  Chronic pain of right knee  Difficulty in walking, not elsewhere classified     Problem List Patient Active Problem List   Diagnosis Date Noted   DDD (degenerative disc disease), thoracic 11/06/2020   Other idiopathic scoliosis, thoracolumbar region 11/06/2020   DDD (degenerative disc disease), lumbar 11/06/2020   Age-related osteoporosis without current pathological fracture 11/06/2020   Porokeratosis 11/26/2018   Corns and callosities 11/26/2018   History of right shoulder replacement 10/07/2016   Chronic pain of both knees 05/20/2016   Osteoarthritis of both hands 05/20/2016   Essential hypertension 05/20/2016    Environmental allergies 05/20/2016   Primary osteoarthritis of both knees 05/19/2016   Callus of foot 11/24/2012   Pain in joint, ankle and foot 09/15/2012    Anette Guarneri, PT, DPT 01/31/21 11:56  AM    [facility] [address] [place], [name], [address] Phone: [phone]   Fax:  [phone]  Name: Danielle Dean MRN: [id number] Date of Birth: 1928-10-03

## 2021-02-05 ENCOUNTER — Other Ambulatory Visit: Payer: Self-pay

## 2021-02-05 ENCOUNTER — Ambulatory Visit: Payer: Medicare Other

## 2021-02-05 DIAGNOSIS — M25512 Pain in left shoulder: Secondary | ICD-10-CM

## 2021-02-05 DIAGNOSIS — R262 Difficulty in walking, not elsewhere classified: Secondary | ICD-10-CM

## 2021-02-05 DIAGNOSIS — M545 Low back pain, unspecified: Secondary | ICD-10-CM

## 2021-02-05 DIAGNOSIS — G8929 Other chronic pain: Secondary | ICD-10-CM

## 2021-02-05 DIAGNOSIS — R2689 Other abnormalities of gait and mobility: Secondary | ICD-10-CM

## 2021-02-05 NOTE — Therapy (Signed)
[facility] [address] [place], [name], [address] Phone: [phone]   Fax:  [phone]  Physical Therapy Treatment  Patient Details  Name: Danielle Dean MRN: [id number] Date of Birth: [birth year] Referring Provider (PT): Pollyann Savoy, MD   Encounter Date: 02/05/2021   PT End of Session - 02/05/21 1123     Visit Number 8    Date for PT Re-Evaluation 03/04/21    PT Start Time 1045    PT Stop Time 1128    PT Time Calculation (min) 43 min    Activity Tolerance Patient tolerated treatment well;Patient limited by pain    Behavior During Therapy [facility] for tasks assessed/performed             Past Medical History:  Diagnosis Date   Arthritis    Callus    Cataract fragments in both eyes following surgery    Corns and callosities    Hx of hysterectomy    Hypertension    Osteoarthritis     Past Surgical History:  Procedure Laterality Date   ABDOMINAL HYSTERECTOMY     NASAL SEPTUM SURGERY     ROTATOR CUFF REPAIR Right    TOTAL SHOULDER ARTHROPLASTY      There were no vitals filed for this visit.   Subjective Assessment - 02/05/21 1109     Subjective knees hurt more today, did a walk this morning    Pertinent History osteoporosis    Patient Stated Goals less pain. no falls    Currently in Pain? Yes    Pain Score 6     Pain Location Knee    Pain Orientation Right;Left                               [facility] Adult PT Treatment/Exercise - 02/05/21 0001       Lumbar Exercises: Stretches   Active Hamstring Stretch Left;Right;2 reps;20 seconds    Lower Trunk Rotation 5 reps   5" each, bilateral   Piriformis Stretch Left;Right;2 reps;20 seconds   single knee to chest vs knee to opposite shoulder   Other Lumbar Stretch Exercise pball rollouts x 10 in sitting      Lumbar Exercises: Aerobic   Nustep L5 x 6 min (UEs/LEs)      Lumbar Exercises: Machines for Strengthening   Other Lumbar  Machine Exercise cybex row 3 x 10 at 5#, lat pulldown 2x10 10#      Lumbar Exercises: Supine   Bridge 10 reps   2 sets on green pball   Other Supine Lumbar Exercises lower trunk rotation/obliques on green ball x 10 B      Knee/Hip Exercises: Seated   Sit to Sand 10 reps;without UE support   10x red TB above knees; 10x with ball squeeze;  cues to avoid "plop"     Modalities   Modalities Cryotherapy      Cryotherapy   Number Minutes Cryotherapy 2 Minutes    Cryotherapy Location --   B knees start of session                Balance Exercises - 02/05/21 0001       Balance Exercises: Standing   Other Standing Exercises Comments wt shifts fwd/back on foam 20x; wt shifts fwd/back on foam beam 20x; sidestepping on foam beam 4x   in II bars     Balance Exercises: Seated   Dynamic Sitting --  sitting on dynadisc  pelvic tilts, alt LAQ, alt march 2 sets 10x each with min A d/t imbalance                PT Short Term Goals - 01/15/21 1101       PT SHORT TERM GOAL #1   Title independent with initial HEP    Time 2    Period Weeks    Status Achieved    Target Date 01/21/21               PT Long Term Goals - 01/15/21 1108       PT LONG TERM GOAL #1   Title independent and safe with advanced HEP for maintenance of strength and falls prevention at home    Time 8    Period Weeks    Status On-going      PT LONG TERM GOAL #2   Title Improve 5TSTS to </= 12 seconds to demonstrate improved functional strength    Baseline 25 seconds with UE assist and poor control, incr pain    Time 8    Period Weeks    Status On-going      PT LONG TERM GOAL #3   Title report pain 50% less with cooking    Time 8    Period Weeks    Status On-going      PT LONG TERM GOAL #4   Title DGI improved to at least 19/24 to demonstrate decreased falls risk    Time 8    Period Weeks    Status On-going                   Plan - 02/05/21 1123     Clinical Impression  Statement Pt entered with increased c/o B knee pain today so star tof session with a few minutes ice pack to B knees to help with this before exercises. Continues to need cues for eccentric control with STS. Able to slightly progress machine exercises as tolerated today just with some additional fatigue. Difficutly with seated pelvic tilts and isolated pelvic mobility so supine mobility was also trialed today without increased pain, bridges on ball with cues needed for hip extension rather than xs arhcing with lumbar, partial range noted. No complaints at end of session. Plan to assess and progress as tolerated.    Stability/Clinical Decision Making Stable/Uncomplicated    Rehab Potential Good    PT Frequency 2x / week    PT Duration 8 weeks    PT Treatment/Interventions ADLs/Self Care Home Management;Cryotherapy;Moist Heat;Electrical Stimulation;Gait training;Therapeutic exercise;Therapeutic activities;Functional mobility training;Balance training;Neuromuscular re-education;Patient/family education;Manual techniques    PT Next Visit Plan Functional back/core, UE/LE strength and flexibility. dynamic balance challenges as tolerated. manual & modalities as needed.    PT Home Exercise Plan see pt instructions    Consulted and Agree with Plan of Care Patient             Patient will benefit from skilled therapeutic intervention in order to improve the following deficits and impairments:  Decreased balance, Abnormal gait, Decreased strength, Impaired flexibility, Postural dysfunction, Pain, Decreased range of motion  Visit Diagnosis: Other abnormalities of gait and mobility  Acute bilateral low back pain without sciatica  Left shoulder pain, unspecified chronicity  Chronic pain of left knee  Chronic pain of right knee  Difficulty in walking, not elsewhere classified     Problem List Patient Active Problem List   Diagnosis Date Noted   DDD (degenerative disc  disease), thoracic  11/06/2020   Other idiopathic scoliosis, thoracolumbar region 11/06/2020   DDD (degenerative disc disease), lumbar 11/06/2020   Age-related osteoporosis without current pathological fracture 11/06/2020   Porokeratosis 11/26/2018   Corns and callosities 11/26/2018   History of right shoulder replacement 10/07/2016   Chronic pain of both knees 05/20/2016   Osteoarthritis of both hands 05/20/2016   Essential hypertension 05/20/2016   Environmental allergies 05/20/2016   Primary osteoarthritis of both knees 05/19/2016   Callus of foot 11/24/2012   Pain in joint, ankle and foot 09/15/2012    Anson Crofts, PT, DPT 02/05/2021, 11:26 AM  [facility] [address] [place], [name], [address] Phone: [phone]   Fax:  [phone]  Name: Danielle Dean MRN: [id number] Date of Birth: 11/30/1928

## 2021-02-07 ENCOUNTER — Ambulatory Visit: Payer: Medicare Other

## 2021-02-12 ENCOUNTER — Ambulatory Visit: Payer: Medicare Other

## 2021-02-12 ENCOUNTER — Other Ambulatory Visit: Payer: Self-pay

## 2021-02-12 DIAGNOSIS — M545 Low back pain, unspecified: Secondary | ICD-10-CM

## 2021-02-12 DIAGNOSIS — M25512 Pain in left shoulder: Secondary | ICD-10-CM

## 2021-02-12 DIAGNOSIS — R2689 Other abnormalities of gait and mobility: Secondary | ICD-10-CM

## 2021-02-12 DIAGNOSIS — M25562 Pain in left knee: Secondary | ICD-10-CM

## 2021-02-12 DIAGNOSIS — G8929 Other chronic pain: Secondary | ICD-10-CM

## 2021-02-12 DIAGNOSIS — R262 Difficulty in walking, not elsewhere classified: Secondary | ICD-10-CM

## 2021-02-12 NOTE — Therapy (Signed)
[facility] [address] [place], [name], [address] Phone: [phone]   Fax:  [phone]  Physical Therapy Treatment  Patient Details  Name: Danielle Dean MRN: [id number] Date of Birth: 23-Jul-1928 Referring Provider (PT): Pollyann Savoy, MD   Encounter Date: 02/12/2021   PT End of Session - 02/12/21 1130     Visit Number 9    Date for PT Re-Evaluation 03/04/21    PT Start Time 1045    PT Stop Time 1130    PT Time Calculation (min) 45 min    Activity Tolerance Patient tolerated treatment well;Patient limited by pain    Behavior During Therapy [facility] for tasks assessed/performed             Past Medical History:  Diagnosis Date   Arthritis    Callus    Cataract fragments in both eyes following surgery    Corns and callosities    Hx of hysterectomy    Hypertension    Osteoarthritis     Past Surgical History:  Procedure Laterality Date   ABDOMINAL HYSTERECTOMY     NASAL SEPTUM SURGERY     ROTATOR CUFF REPAIR Right    TOTAL SHOULDER ARTHROPLASTY      There were no vitals filed for this visit.   Subjective Assessment - 02/12/21 1049     Subjective more back and knee pain today    Pertinent History osteoporosis    Patient Stated Goals less pain. no falls    Currently in Pain? Yes    Pain Score 6     Pain Location Knee   and back right side   Pain Orientation Left;Right                               [facility] Adult PT Treatment/Exercise - 02/12/21 0001       High Level Balance   High Level Balance Comments side step back and forth 5 x3 SBA      Lumbar Exercises: Stretches   Other Lumbar Stretch Exercise hand slides up wall for shoulder flexion AAROM and lumbar ext stretch 10" x 3    Other Lumbar Stretch Exercise lateal flexion seated stretch 5" x 5 B. seated cat camel on dynadisc - x10 with mod A PT to facilitate, 2nd set x 10  min A      Lumbar Exercises: Aerobic   Nustep L5 x 6  min (UEs/LEs)      Lumbar Exercises: Machines for Strengthening   Other Lumbar Machine Exercise cybex row 10 # & lat pulldown 2x10 10#   trialed lat pull 15# x 3 and pt asked to back down weight     Lumbar Exercises: Seated   Other Seated Lumbar Exercises ab iso with red pball 20 x 3"    Other Seated Lumbar Exercises sitting on dynadisc with feet on airex B horizontal abduction with yellow TB 2x10, alt UE/LE lift 10x2 max A UE support for sequencing   manual cueing required to encourage anterior pelvic tilt; intermittent CGA/min A for balance     Knee/Hip Exercises: Seated   Long Arc Quad Strengthening;Both;2 sets;10 reps   2#   Hamstring Curl Strengthening;Both;2 sets;10 reps   green TB   Sit to Sand 5 reps   x 5 on airex with UE support  PT Short Term Goals - 01/15/21 1101       PT SHORT TERM GOAL #1   Title independent with initial HEP    Time 2    Period Weeks    Status Achieved    Target Date 01/21/21               PT Long Term Goals - 01/15/21 1108       PT LONG TERM GOAL #1   Title independent and safe with advanced HEP for maintenance of strength and falls prevention at home    Time 8    Period Weeks    Status On-going      PT LONG TERM GOAL #2   Title Improve 5TSTS to </= 12 seconds to demonstrate improved functional strength    Baseline 25 seconds with UE assist and poor control, incr pain    Time 8    Period Weeks    Status On-going      PT LONG TERM GOAL #3   Title report pain 50% less with cooking    Time 8    Period Weeks    Status On-going      PT LONG TERM GOAL #4   Title DGI improved to at least 19/24 to demonstrate decreased falls risk    Time 8    Period Weeks    Status On-going                   Plan - 02/12/21 1132     Clinical Impression Statement Pt with continued c/o B knee and low back pain more on right side today. Continues to need cues for eccentric control with exercises. Trialed  increased weight for lat pull down and pt bservable able to complete but did request to back down weight d/t increased effort. Difficulty with seated back stretches requriring manual faciltiation by PT, educated to incorporated modified seated cat camel at home to help back mobility , pain    Stability/Clinical Decision Making Stable/Uncomplicated    Rehab Potential Good    PT Frequency 2x / week    PT Duration 8 weeks    PT Treatment/Interventions ADLs/Self Care Home Management;Cryotherapy;Moist Heat;Electrical Stimulation;Gait training;Therapeutic exercise;Therapeutic activities;Functional mobility training;Balance training;Neuromuscular re-education;Patient/family education;Manual techniques    PT Next Visit Plan Functional back/core, UE/LE strength and flexibility. dynamic balance challenges as tolerated. manual & modalities as needed.    PT Home Exercise Plan see pt instructions    Consulted and Agree with Plan of Care Patient             Patient will benefit from skilled therapeutic intervention in order to improve the following deficits and impairments:  Decreased balance, Abnormal gait, Decreased strength, Impaired flexibility, Postural dysfunction, Pain, Decreased range of motion  Visit Diagnosis: Other abnormalities of gait and mobility  Acute bilateral low back pain without sciatica  Left shoulder pain, unspecified chronicity  Chronic pain of left knee  Chronic pain of right knee  Difficulty in walking, not elsewhere classified     Problem List Patient Active Problem List   Diagnosis Date Noted   DDD (degenerative disc disease), thoracic 11/06/2020   Other idiopathic scoliosis, thoracolumbar region 11/06/2020   DDD (degenerative disc disease), lumbar 11/06/2020   Age-related osteoporosis without current pathological fracture 11/06/2020   Porokeratosis 11/26/2018   Corns and callosities 11/26/2018   History of right shoulder replacement 10/07/2016   Chronic pain  of both knees 05/20/2016   Osteoarthritis of both hands 05/20/2016  Essential hypertension 05/20/2016   Environmental allergies 05/20/2016   Primary osteoarthritis of both knees 05/19/2016   Callus of foot 11/24/2012   Pain in joint, ankle and foot 09/15/2012    Anson Crofts, PT, DPT 02/12/2021, 11:40 AM  [facility] [address] [place], [name], [address] Phone: [phone]   Fax:  [phone]  Name: Danielle Dean MRN: [id number] Date of Birth: 1928-09-21

## 2021-02-13 ENCOUNTER — Ambulatory Visit: Payer: Medicare Other

## 2021-02-13 DIAGNOSIS — R2689 Other abnormalities of gait and mobility: Secondary | ICD-10-CM | POA: Diagnosis not present

## 2021-02-13 DIAGNOSIS — M25562 Pain in left knee: Secondary | ICD-10-CM

## 2021-02-13 DIAGNOSIS — M25512 Pain in left shoulder: Secondary | ICD-10-CM

## 2021-02-13 DIAGNOSIS — M25561 Pain in right knee: Secondary | ICD-10-CM

## 2021-02-13 DIAGNOSIS — M545 Low back pain, unspecified: Secondary | ICD-10-CM

## 2021-02-13 DIAGNOSIS — G8929 Other chronic pain: Secondary | ICD-10-CM

## 2021-02-13 DIAGNOSIS — R262 Difficulty in walking, not elsewhere classified: Secondary | ICD-10-CM

## 2021-02-13 NOTE — Therapy (Signed)
[facility] [address] [place], [name], [address] Phone: [phone]   Fax:  [phone]  Physical Therapy Treatment 10th visit Progress Note Reporting Period 01/07/21  to 02/13/21  See note below for Objective Data and Assessment of Progress/Goals.      Patient Details  Name: Danielle Dean MRN: [id number] Date of Birth: 1929-03-22 Referring Provider (PT): Pollyann Savoy, MD   Encounter Date: 02/13/2021   PT End of Session - 02/13/21 1050     Visit Number 10    Date for PT Re-Evaluation 03/04/21    PT Start Time 1047    PT Stop Time 1127    PT Time Calculation (min) 40 min    Activity Tolerance Patient tolerated treatment well;Patient limited by pain    Behavior During Therapy [facility] for tasks assessed/performed             Past Medical History:  Diagnosis Date   Arthritis    Callus    Cataract fragments in both eyes following surgery    Corns and callosities    Hx of hysterectomy    Hypertension    Osteoarthritis     Past Surgical History:  Procedure Laterality Date   ABDOMINAL HYSTERECTOMY     NASAL SEPTUM SURGERY     ROTATOR CUFF REPAIR Right    TOTAL SHOULDER ARTHROPLASTY      There were no vitals filed for this visit.   Subjective Assessment - 02/13/21 1049     Subjective less pain today compared to yesterday.    Pertinent History osteoporosis    Patient Stated Goals less pain. no falls    Currently in Pain? Yes    Pain Score 4     Pain Location Back   and knees, B               [facility] PT Assessment - 02/13/21 0001       Transfers   Five time sit to stand comments  19 seconds, no hands, knee pain decr control      Dynamic Gait Index   Level Surface Mild Impairment    Change in Gait Speed Moderate Impairment    Gait with Horizontal Head Turns Moderate Impairment   difficult, listing over to left   Gait with Vertical Head Turns Mild Impairment    Gait and Pivot Turn Moderate  Impairment    Step Over Obstacle Moderate Impairment    Step Around Obstacles Moderate Impairment    Steps Moderate Impairment    Total Score 10                           [facility] Adult PT Treatment/Exercise - 02/13/21 0001       Lumbar Exercises: Stretches   Other Lumbar Stretch Exercise seated cat camel x 10 min A facilitation by PT. seated fleixon with arms supported on chair back anterior 10" x 5      Lumbar Exercises: Aerobic   Nustep L5 x 6 BUE/LE      Knee/Hip Exercises: Seated   Marching Limitations lateral step to 4" step in sitting x 15 each side.    Sit to Sand 5 reps;3 sets   airex cues for incr right WS, elevated mat table.                Balance Exercises - 02/13/21 0001       Balance Exercises: Standing   Standing Eyes  Opened 30 secs;Narrow base of support (BOS);4 reps   multidirectional perturbations for 2 reps, head turns for 2 reps.EO vs EC   Tandem Stance Eyes open;30 secs;2 reps;Upper extremity support 2   each side, with head turns   Marching Foam/compliant surface;20 reps;Upper extremity assist 1;Upper extremity assist 2    Heel Raises Both;20 reps   BHHA, 1 # AW   Other Standing Exercises stnading march 1# B alt 10 x 2 with BHHA.  hip abd BHHA x10 with 1# AW.                 PT Short Term Goals - 01/15/21 1101       PT SHORT TERM GOAL #1   Title independent with initial HEP    Time 2    Period Weeks    Status Achieved    Target Date 01/21/21               PT Long Term Goals - 01/15/21 1108       PT LONG TERM GOAL #1   Title independent and safe with advanced HEP for maintenance of strength and falls prevention at home    Time 8    Period Weeks    Status On-going      PT LONG TERM GOAL #2   Title Improve 5TSTS to </= 12 seconds to demonstrate improved functional strength    Baseline 25 seconds with UE assist and poor control, incr pain    Time 8    Period Weeks    Status On-going      PT LONG TERM  GOAL #3   Title report pain 50% less with cooking    Time 8    Period Weeks    Status On-going      PT LONG TERM GOAL #4   Title DGI improved to at least 19/24 to demonstrate decreased falls risk    Time 8    Period Weeks    Status On-going                   Plan - 02/13/21 1052     Clinical Impression Statement Pt is making gradual progress towards goals. Stil with B knee and back pain (more with walking, standing) that is pretty consistent, slightly less today compared to previous. 5TSTS improved to 19 seconds, still with decr control.   DGI improved to 10 deviations noted. with scoliotic spine her COG is more toward the  left which i believe contricutes to diffiuclties with balance, listing to left with dynamic gait and with strength exercises/balance in standing requiring assist or decreasing speed  to prevent LOB. She will benefit from continued skilled PT to work on increasing strength, improving flexibility and dynamic balance.    PT Next Visit Plan Functional back/core, UE/LE strength and flexibility. dynamic balance challenges as tolerated. manual & modalities as needed.             Patient will benefit from skilled therapeutic intervention in order to improve the following deficits and impairments:     Visit Diagnosis: Other abnormalities of gait and mobility  Acute bilateral low back pain without sciatica  Left shoulder pain, unspecified chronicity  Chronic pain of left knee  Chronic pain of right knee  Difficulty in walking, not elsewhere classified     Problem List Patient Active Problem List   Diagnosis Date Noted   DDD (degenerative disc disease), thoracic 11/06/2020   Other idiopathic scoliosis, thoracolumbar region 11/06/2020  DDD (degenerative disc disease), lumbar 11/06/2020   Age-related osteoporosis without current pathological fracture 11/06/2020   Porokeratosis 11/26/2018   Corns and callosities 11/26/2018   History of right  shoulder replacement 10/07/2016   Chronic pain of both knees 05/20/2016   Osteoarthritis of both hands 05/20/2016   Essential hypertension 05/20/2016   Environmental allergies 05/20/2016   Primary osteoarthritis of both knees 05/19/2016   Callus of foot 11/24/2012   Pain in joint, ankle and foot 09/15/2012    Anson Crofts, PT, DPT 02/13/2021, 11:34 AM  [facility] [address] [place], [name], [address] Phone: [phone]   Fax:  [phone]  Name: Danielle Dean MRN: [id number] Date of Birth: [birth year]

## 2021-02-19 ENCOUNTER — Encounter: Payer: Self-pay | Admitting: Physical Therapy

## 2021-02-19 ENCOUNTER — Ambulatory Visit: Payer: Medicare Other | Attending: Rheumatology | Admitting: Physical Therapy

## 2021-02-19 ENCOUNTER — Other Ambulatory Visit: Payer: Self-pay

## 2021-02-19 DIAGNOSIS — M25512 Pain in left shoulder: Secondary | ICD-10-CM

## 2021-02-19 DIAGNOSIS — M545 Low back pain, unspecified: Secondary | ICD-10-CM | POA: Diagnosis present

## 2021-02-19 DIAGNOSIS — R262 Difficulty in walking, not elsewhere classified: Secondary | ICD-10-CM

## 2021-02-19 DIAGNOSIS — M25562 Pain in left knee: Secondary | ICD-10-CM | POA: Insufficient documentation

## 2021-02-19 DIAGNOSIS — M25561 Pain in right knee: Secondary | ICD-10-CM | POA: Diagnosis present

## 2021-02-19 DIAGNOSIS — G8929 Other chronic pain: Secondary | ICD-10-CM

## 2021-02-19 DIAGNOSIS — R2689 Other abnormalities of gait and mobility: Secondary | ICD-10-CM | POA: Diagnosis not present

## 2021-02-19 NOTE — Therapy (Signed)
[facility] [place] [facility] [address] [place], [name], [address] Phone: [phone]   Fax:  [phone]  Physical Therapy Treatment  Patient Details  Name: Danielle Dean MRN: [id number] Date of Birth: 1928-12-24 Referring Provider (PT): Pollyann Savoy, MD   Encounter Date: 02/19/2021   PT End of Session - 02/19/21 1131     Visit Number 11    Date for PT Re-Evaluation 03/04/21    PT Start Time 1014    PT Stop Time 1059    PT Time Calculation (min) 45 min    Activity Tolerance Patient tolerated treatment well;Patient limited by pain    Behavior During Therapy [facility] for tasks assessed/performed             Past Medical History:  Diagnosis Date   Arthritis    Callus    Cataract fragments in both eyes following surgery    Corns and callosities    Hx of hysterectomy    Hypertension    Osteoarthritis     Past Surgical History:  Procedure Laterality Date   ABDOMINAL HYSTERECTOMY     NASAL SEPTUM SURGERY     ROTATOR CUFF REPAIR Right    TOTAL SHOULDER ARTHROPLASTY      There were no vitals filed for this visit.   Subjective Assessment - 02/19/21 1020     Subjective Patient reports pain in the back and the knees with standing and walking    Currently in Pain? Yes    Pain Score 4     Pain Location Back    Pain Orientation Right;Left;Lower    Pain Descriptors / Indicators Sore    Pain Relieving Factors rest                               [facility] Adult PT Treatment/Exercise - 02/19/21 0001       Lumbar Exercises: Aerobic   UBE (Upper Arm Bike) level 1 x 4 minutes    Recumbent Bike tried recumbent bike, had to have a trunk lean due to some pain in the right knee, did 2 minutes    Nustep L5 x 6 BUE/LE      Lumbar Exercises: Machines for Strengthening   Cybex Knee Flexion 10# 2x10    Other Lumbar Machine Exercise cybex row 10 # & lat pulldown 2x10 10#, 5# straight arm pulls x 10 only, had some shoulder  pain with this      Lumbar Exercises: Seated   Other Seated Lumbar Exercises side bendstretches, tellow tband shoulder ER      Lumbar Exercises: Supine   Other Supine Lumbar Exercises feet on ball K2C, trunk rotation, small bridge and isometric abs      Knee/Hip Exercises: Seated   Ball Squeeze 20      Knee/Hip Exercises: Supine   Short Arc Quad Sets Both;2 sets;10 reps    Short Arc Quad Sets Limitations 3#    Other Supine Knee/Hip Exercises red tband clams                      PT Short Term Goals - 01/15/21 1101       PT SHORT TERM GOAL #1   Title independent with initial HEP    Time 2    Period Weeks    Status Achieved    Target Date 01/21/21  PT Long Term Goals - 01/15/21 1108       PT LONG TERM GOAL #1   Title independent and safe with advanced HEP for maintenance of strength and falls prevention at home    Time 8    Period Weeks    Status On-going      PT LONG TERM GOAL #2   Title Improve 5TSTS to </= 12 seconds to demonstrate improved functional strength    Baseline 25 seconds with UE assist and poor control, incr pain    Time 8    Period Weeks    Status On-going      PT LONG TERM GOAL #3   Title report pain 50% less with cooking    Time 8    Period Weeks    Status On-going      PT LONG TERM GOAL #4   Title DGI improved to at least 19/24 to demonstrate decreased falls risk    Time 8    Period Weeks    Status On-going                   Plan - 02/19/21 1131     Clinical Impression Statement Patient continues to report back and knee pain with stnading and walking, I tried the bike we did 2 minutes but did have trunk lean and some increase knee pain.  I tried the 5# arm extension on the pulleys this was heavy for her and caused some shoulder pain, added some yellow tband horizontal abduction and ER, also did some trunk side bending to help with the back pain    PT Next Visit Plan Functional back/core, UE/LE  strength and flexibility. dynamic balance challenges as tolerated. manual & modalities as needed.    Consulted and Agree with Plan of Care Patient             Patient will benefit from skilled therapeutic intervention in order to improve the following deficits and impairments:  Decreased balance, Abnormal gait, Decreased strength, Impaired flexibility, Postural dysfunction, Pain, Decreased range of motion  Visit Diagnosis: Other abnormalities of gait and mobility  Acute bilateral low back pain without sciatica  Left shoulder pain, unspecified chronicity  Chronic pain of left knee  Chronic pain of right knee  Difficulty in walking, not elsewhere classified     Problem List Patient Active Problem List   Diagnosis Date Noted   DDD (degenerative disc disease), thoracic 11/06/2020   Other idiopathic scoliosis, thoracolumbar region 11/06/2020   DDD (degenerative disc disease), lumbar 11/06/2020   Age-related osteoporosis without current pathological fracture 11/06/2020   Porokeratosis 11/26/2018   Corns and callosities 11/26/2018   History of right shoulder replacement 10/07/2016   Chronic pain of both knees 05/20/2016   Osteoarthritis of both hands 05/20/2016   Essential hypertension 05/20/2016   Environmental allergies 05/20/2016   Primary osteoarthritis of both knees 05/19/2016   Callus of foot 11/24/2012   Pain in joint, ankle and foot 09/15/2012    Jearld Lesch., PT 02/19/2021, 11:48 AM  [facility] [place] [facility] [address] [place], [name], [address] Phone: [phone]   Fax:  [phone]  Name: Danielle Dean MRN: [id number] Date of Birth: [birth year]

## 2021-02-21 ENCOUNTER — Ambulatory Visit: Payer: Medicare Other

## 2021-02-21 ENCOUNTER — Other Ambulatory Visit: Payer: Self-pay

## 2021-02-21 DIAGNOSIS — M545 Low back pain, unspecified: Secondary | ICD-10-CM

## 2021-02-21 DIAGNOSIS — R2689 Other abnormalities of gait and mobility: Secondary | ICD-10-CM | POA: Diagnosis not present

## 2021-02-21 DIAGNOSIS — R262 Difficulty in walking, not elsewhere classified: Secondary | ICD-10-CM

## 2021-02-21 DIAGNOSIS — M25512 Pain in left shoulder: Secondary | ICD-10-CM

## 2021-02-21 DIAGNOSIS — M25561 Pain in right knee: Secondary | ICD-10-CM

## 2021-02-21 DIAGNOSIS — M25562 Pain in left knee: Secondary | ICD-10-CM

## 2021-02-21 DIAGNOSIS — G8929 Other chronic pain: Secondary | ICD-10-CM

## 2021-02-21 NOTE — Therapy (Signed)
[facility] [place] [facility] [address] [place], [name], [address] Phone: [phone]   Fax:  [phone]  Physical Therapy Treatment  Patient Details  Name: Danielle Dean MRN: [id number] Date of Birth: May 15, 1929 Referring Provider (PT): Pollyann Savoy, MD   Encounter Date: 02/21/2021   PT End of Session - 02/21/21 1053     Visit Number 12    Date for PT Re-Evaluation 03/04/21    PT Start Time 1045    PT Stop Time 1125    PT Time Calculation (min) 40 min    Activity Tolerance Patient tolerated treatment well;Patient limited by pain    Behavior During Therapy [facility] for tasks assessed/performed             Past Medical History:  Diagnosis Date   Arthritis    Callus    Cataract fragments in both eyes following surgery    Corns and callosities    Hx of hysterectomy    Hypertension    Osteoarthritis     Past Surgical History:  Procedure Laterality Date   ABDOMINAL HYSTERECTOMY     NASAL SEPTUM SURGERY     ROTATOR CUFF REPAIR Right    TOTAL SHOULDER ARTHROPLASTY      There were no vitals filed for this visit.   Subjective Assessment - 02/21/21 1052     Subjective No change since previous    Currently in Pain? Yes    Pain Score 4     Pain Location Back                               [facility] Adult PT Treatment/Exercise - 02/21/21 0001       Lumbar Exercises: Stretches   Other Lumbar Stretch Exercise lateral flexion stretch with bolster laterally, cues for shoulder relaxation/arm placement to avoid pain exacerbation     Lumbar Exercises: Aerobic   UBE (Upper Arm Bike) level 1 x 4 minutes (2 min each way)    Nustep L5 x 6 BUE/LE      Lumbar Exercises: Machines for Strengthening   Cybex Knee Flexion 10# 2x15    Other Lumbar Machine Exercise cybex row 10 # & lat pulldown 2x10 10#, 5# straight arm pulls x 1, x5 ;limited by fatigue.      Knee/Hip Exercises: Standing   Other Standing Knee Exercises  alt side step B with SBA x 15 L/R   toe taps in ABD     Knee/Hip Exercises: Seated   Long Arc Quad Strengthening;Both;2 sets;10 reps   with ball squeeze for VMO,   Ball Squeeze 20    Other Seated Knee/Hip Exercises long sit RLE ankle eversion AROM x 10 cues without hip compensations    Abd/Adduction Limitations clamshells red TB 2 x 15                       PT Short Term Goals - 01/15/21 1101       PT SHORT TERM GOAL #1   Title independent with initial HEP    Time 2    Period Weeks    Status Achieved    Target Date 01/21/21               PT Long Term Goals - 01/15/21 1108       PT LONG TERM GOAL #1   Title independent and safe with advanced HEP for maintenance of strength  and falls prevention at home    Time 8    Period Weeks    Status On-going      PT LONG TERM GOAL #2   Title Improve 5TSTS to </= 12 seconds to demonstrate improved functional strength    Baseline 25 seconds with UE assist and poor control, incr pain    Time 8    Period Weeks    Status On-going      PT LONG TERM GOAL #3   Title report pain 50% less with cooking    Time 8    Period Weeks    Status On-going      PT LONG TERM GOAL #4   Title DGI improved to at least 19/24 to demonstrate decreased falls risk    Time 8    Period Weeks    Status On-going                   Plan - 02/21/21 1139     Clinical Impression Statement Sanam tolerated all interventions fairly today, some progressions in increased repetitions with  knee flexion and shoulder extensiion machines, limited moreso to fatigue. Added adductor sueeze with LAQ today without weight and this seemed to be tolerated nicely , less knee pain. She did express some concern about right leg position, ankle resting moreso in eversion but AROM intact when practiced in sitting. Continue per POC working on core and LE stability, general strength and conditioning. and falls prevention    PT Next Visit Plan Functional  back/core, UE/LE strength and flexibility. dynamic balance challenges as tolerated. manual & modalities as needed.    Consulted and Agree with Plan of Care Patient             Patient will benefit from skilled therapeutic intervention in order to improve the following deficits and impairments:  Decreased balance, Abnormal gait, Decreased strength, Impaired flexibility, Postural dysfunction, Pain, Decreased range of motion  Visit Diagnosis: Other abnormalities of gait and mobility  Acute bilateral low back pain without sciatica  Left shoulder pain, unspecified chronicity  Chronic pain of left knee  Chronic pain of right knee  Difficulty in walking, not elsewhere classified     Problem List Patient Active Problem List   Diagnosis Date Noted   DDD (degenerative disc disease), thoracic 11/06/2020   Other idiopathic scoliosis, thoracolumbar region 11/06/2020   DDD (degenerative disc disease), lumbar 11/06/2020   Age-related osteoporosis without current pathological fracture 11/06/2020   Porokeratosis 11/26/2018   Corns and callosities 11/26/2018   History of right shoulder replacement 10/07/2016   Chronic pain of both knees 05/20/2016   Osteoarthritis of both hands 05/20/2016   Essential hypertension 05/20/2016   Environmental allergies 05/20/2016   Primary osteoarthritis of both knees 05/19/2016   Callus of foot 11/24/2012   Pain in joint, ankle and foot 09/15/2012    Anson Crofts, PT, DPT  02/21/2021, 11:41 AM  [facility] [address] [place], [name], [address] Phone: [phone]   Fax:  [phone]  Name: Danielle Dean MRN: [id number] Date of Birth: 1928-08-06

## 2021-03-01 ENCOUNTER — Other Ambulatory Visit: Payer: Self-pay

## 2021-03-01 ENCOUNTER — Ambulatory Visit: Payer: Medicare Other | Admitting: Physical Therapy

## 2021-03-01 ENCOUNTER — Encounter: Payer: Self-pay | Admitting: Physical Therapy

## 2021-03-01 DIAGNOSIS — M545 Low back pain, unspecified: Secondary | ICD-10-CM

## 2021-03-01 DIAGNOSIS — M25512 Pain in left shoulder: Secondary | ICD-10-CM

## 2021-03-01 DIAGNOSIS — R2689 Other abnormalities of gait and mobility: Secondary | ICD-10-CM | POA: Diagnosis not present

## 2021-03-01 DIAGNOSIS — R262 Difficulty in walking, not elsewhere classified: Secondary | ICD-10-CM

## 2021-03-01 DIAGNOSIS — G8929 Other chronic pain: Secondary | ICD-10-CM

## 2021-03-01 NOTE — Therapy (Signed)
[facility] [address] [place], [name], [address] Phone: [phone]   Fax:  [phone]  Physical Therapy Re-Certification  Patient Details  Name: Danielle Dean MRN: [id number] Date of Birth: 1928/09/17 Referring Provider (PT): Pollyann Savoy, MD   Encounter Date: 03/01/2021   PT End of Session - 03/01/21 1215     Visit Number 13    Number of Visits 19    Date for PT Re-Evaluation 04/12/21    PT Start Time 1015    PT Stop Time 1053    PT Time Calculation (min) 38 min    Equipment Utilized During Treatment Gait belt    Activity Tolerance Patient tolerated treatment well;Patient limited by pain    Behavior During Therapy [facility] for tasks assessed/performed             Past Medical History:  Diagnosis Date   Arthritis    Callus    Cataract fragments in both eyes following surgery    Corns and callosities    Hx of hysterectomy    Hypertension    Osteoarthritis     Past Surgical History:  Procedure Laterality Date   ABDOMINAL HYSTERECTOMY     NASAL SEPTUM SURGERY     ROTATOR CUFF REPAIR Right    TOTAL SHOULDER ARTHROPLASTY      There were no vitals filed for this visit.   Subjective Assessment - 03/01/21 1212     Subjective Having increased L>R knee pain today withoutknown cause. has been taking more pain med as a result. Would like to continue with PT because it has been helping.    Pertinent History osteoporosis    Patient Stated Goals less pain. no falls    Currently in Pain? No/denies                [facility] PT Assessment - 03/01/21 0001       Assessment   Medical Diagnosis M17.0 (ICD-10-CM) - Primary osteoarthritis of both knees  M25.512,G89.29 (ICD-10-CM) - Chronic left shoulder pain  M19.041,M19.042 (ICD-10-CM) - Primary osteoarthritis of both hands    Referring Provider (PT) Pollyann Savoy, MD    Onset Date/Surgical Date --   chronic     Dynamic Gait Index   Level Surface Normal     Change in Gait Speed Mild Impairment    Gait with Horizontal Head Turns Normal    Gait with Vertical Head Turns Normal    Gait and Pivot Turn Normal    Step Over Obstacle Mild Impairment    Step Around Obstacles Normal    Steps Moderate Impairment    Total Score 20              Treatment:   Nustep L5x6 min Machine row 10x 10#, 15x, 5# R/L sitting gastroc stretch with strap 30"                    PT Education - 03/01/21 1214     Education Details discussion on patient's current POC and expectation to transition to home program at some point, importance of daily movement of some sort, discussion on status of patient's goals    Person(s) Educated Patient    Methods Explanation    Comprehension Verbalized understanding              PT Short Term Goals - 03/01/21 1217       PT SHORT TERM GOAL #1   Title independent with initial HEP  Time 2    Period Weeks    Status Achieved    Target Date 01/21/21               PT Long Term Goals - 03/01/21 1217       PT LONG TERM GOAL #1   Title independent and safe with advanced HEP for maintenance of strength and falls prevention at home    Time 6    Period Weeks    Status On-going   reports only doing 15-20 min walks for exercise at this time   Target Date 04/12/21      PT LONG TERM GOAL #2   Title Improve 5TSTS to </= 12 seconds to demonstrate improved functional strength    Baseline 25 seconds with UE assist and poor control, incr pain    Time 6    Period Weeks    Status On-going   NT   Target Date 04/12/21      PT LONG TERM GOAL #3   Title report pain 50% less with cooking    Time 6    Period Weeks    Status On-going   reports still having pain, unsure if there is any benefit   Target Date 04/12/21      PT LONG TERM GOAL #4   Title DGI improved to at least 19/24 to demonstrate decreased falls risk    Time 8    Period Weeks    Status Achieved                   Plan -  03/01/21 1216     Clinical Impression Statement Patient arrived to session with report of L>R knee pain when walking without known cause. Has been increasing her pain medication intake d/t pain. Reports that she would like to continue working with therapy as she benefits from it. Also reports benefit from back brace/belt and B knee brace with some benefit. Patient reports that she is doing minimal cooking as she has help from her kids. Reports that she still does some walking for 15-20 minutes with 4WW or walking stick. Patient's score on DGI indicates a decreased risk of falls. Remaining imbalance occurs with stepping over obstacles, stairs, and changing gait speed. Worked on posterior chain strengthening and LE stretching today, which was tolerated well. Patient still experiences chronic daily pain and trouble completing ADLs, including tasks to care for herself and act as caregiver for her husband. Patient would benefit from additional skilled PT services 1x/week for 6 weeks to address remaining impairments and improve QOL.    PT Frequency 1x / week    PT Duration 6 weeks    PT Treatment/Interventions ADLs/Self Care Home Management;Cryotherapy;Moist Heat;Electrical Stimulation;Gait training;Therapeutic exercise;Therapeutic activities;Functional mobility training;Balance training;Neuromuscular re-education;Patient/family education;Manual techniques    PT Next Visit Plan Functional back/core, UE/LE strength and flexibility. dynamic balance challenges as tolerated. manual & modalities as needed.    Consulted and Agree with Plan of Care Patient             Patient will benefit from skilled therapeutic intervention in order to improve the following deficits and impairments:  Decreased balance, Abnormal gait, Decreased strength, Impaired flexibility, Postural dysfunction, Pain, Decreased range of motion  Visit Diagnosis: Other abnormalities of gait and mobility  Acute bilateral low back pain without  sciatica  Left shoulder pain, unspecified chronicity  Chronic pain of left knee  Chronic pain of right knee  Difficulty in walking, not elsewhere classified  Problem List Patient Active Problem List   Diagnosis Date Noted   DDD (degenerative disc disease), thoracic 11/06/2020   Other idiopathic scoliosis, thoracolumbar region 11/06/2020   DDD (degenerative disc disease), lumbar 11/06/2020   Age-related osteoporosis without current pathological fracture 11/06/2020   Porokeratosis 11/26/2018   Corns and callosities 11/26/2018   History of right shoulder replacement 10/07/2016   Chronic pain of both knees 05/20/2016   Osteoarthritis of both hands 05/20/2016   Essential hypertension 05/20/2016   Environmental allergies 05/20/2016   Primary osteoarthritis of both knees 05/19/2016   Callus of foot 11/24/2012   Pain in joint, ankle and foot 09/15/2012    Anette Guarneri, PT, DPT 03/01/21 12:20 PM   [facility] [place] [facility] [address] [place], [name], [address] Phone: [phone]   Fax:  [phone]  Name: CHARISA TWITTY MRN: [id number] Date of Birth: 26-Dec-1928

## 2021-03-05 ENCOUNTER — Encounter: Payer: Self-pay | Admitting: Rehabilitative and Restorative Service Providers"

## 2021-03-05 ENCOUNTER — Other Ambulatory Visit: Payer: Self-pay

## 2021-03-05 ENCOUNTER — Ambulatory Visit: Payer: Medicare Other | Admitting: Rehabilitative and Restorative Service Providers"

## 2021-03-05 DIAGNOSIS — M25512 Pain in left shoulder: Secondary | ICD-10-CM

## 2021-03-05 DIAGNOSIS — R262 Difficulty in walking, not elsewhere classified: Secondary | ICD-10-CM

## 2021-03-05 DIAGNOSIS — R2689 Other abnormalities of gait and mobility: Secondary | ICD-10-CM | POA: Diagnosis not present

## 2021-03-05 DIAGNOSIS — M25562 Pain in left knee: Secondary | ICD-10-CM

## 2021-03-05 DIAGNOSIS — G8929 Other chronic pain: Secondary | ICD-10-CM

## 2021-03-05 DIAGNOSIS — M545 Low back pain, unspecified: Secondary | ICD-10-CM

## 2021-03-05 NOTE — Therapy (Signed)
[facility] [place] [facility] [address] [place], [name], [address] Phone: [phone]   Fax:  [phone]  Physical Therapy Treatment  Patient Details  Name: Danielle Dean MRN: [id number] Date of Birth: January 16, 1929 Referring Provider (PT): Pollyann Savoy, MD   Encounter Date: 03/05/2021   PT End of Session - 03/05/21 1043     Visit Number 14    Date for PT Re-Evaluation 04/12/21    PT Start Time 1015    PT Stop Time 1055    PT Time Calculation (min) 40 min    Activity Tolerance Patient tolerated treatment well    Behavior During Therapy [facility] for tasks assessed/performed             Past Medical History:  Diagnosis Date   Arthritis    Callus    Cataract fragments in both eyes following surgery    Corns and callosities    Hx of hysterectomy    Hypertension    Osteoarthritis     Past Surgical History:  Procedure Laterality Date   ABDOMINAL HYSTERECTOMY     NASAL SEPTUM SURGERY     ROTATOR CUFF REPAIR Right    TOTAL SHOULDER ARTHROPLASTY      There were no vitals filed for this visit.   Subjective Assessment - 03/05/21 1041     Subjective I have pain in my back and both knees.  I always take Tylenol before coming.    Pertinent History osteoporosis    Patient Stated Goals less pain. no falls    Currently in Pain? Yes    Pain Score 5     Pain Location Back    Pain Orientation Lower    Pain Relieving Factors sitting                               [facility] Adult PT Treatment/Exercise - 03/05/21 0001       High Level Balance   High Level Balance Comments side step back and forth 10 ft x2B SBA      Lumbar Exercises: Aerobic   UBE (Upper Arm Bike) level 1 x 4 minutes (2 min each way)    Nustep L5 x 6 BUE/LE      Lumbar Exercises: Machines for Strengthening   Other Lumbar Machine Exercise cybex row 10 # & lat pulldown 2x1510#,      Lumbar Exercises: Seated   Long Arc Quad on Chair  Strengthening;Both;2 sets;10 reps    LAQ on Chair Weights (lbs) 3    Sit to Stand 10 reps   on airex without UE   Other Seated Lumbar Exercises hip abd on and off airex 3# 15x BIL      Knee/Hip Exercises: Seated   Long Arc Quad Strengthening;Both;2 sets;10 reps   with ball squeeze for VMO   Clamshell with TheraBand Red   2x15   Other Seated Knee/Hip Exercises long sit RLE ankle eversion AROM x 10 cues without hip compensations                       PT Short Term Goals - 03/01/21 1217       PT SHORT TERM GOAL #1   Title independent with initial HEP    Time 2    Period Weeks    Status Achieved    Target Date 01/21/21  PT Long Term Goals - 03/05/21 1058       PT LONG TERM GOAL #1   Title independent and safe with advanced HEP for maintenance of strength and falls prevention at home    Status On-going      PT LONG TERM GOAL #2   Title Improve 5TSTS to </= 12 seconds to demonstrate improved functional strength    Status On-going      PT LONG TERM GOAL #3   Title report pain 50% less with cooking    Status On-going                   Plan - 03/05/21 1055     Clinical Impression Statement Pt continues to progress towards TE and improved functional mobility. She did not report an increase in pain during treatment.  She continues to require cuing during some exercises for improved technique and decreased compensations.    PT Treatment/Interventions ADLs/Self Care Home Management;Cryotherapy;Moist Heat;Electrical Stimulation;Gait training;Therapeutic exercise;Therapeutic activities;Functional mobility training;Balance training;Neuromuscular re-education;Patient/family education;Manual techniques    PT Next Visit Plan Functional back/core, UE/LE strength and flexibility. dynamic balance challenges as tolerated. manual & modalities as needed.    Consulted and Agree with Plan of Care Patient             Patient will benefit from skilled  therapeutic intervention in order to improve the following deficits and impairments:  Decreased balance, Abnormal gait, Decreased strength, Impaired flexibility, Postural dysfunction, Pain, Decreased range of motion  Visit Diagnosis: Other abnormalities of gait and mobility  Acute bilateral low back pain without sciatica  Left shoulder pain, unspecified chronicity  Chronic pain of left knee  Chronic pain of right knee  Difficulty in walking, not elsewhere classified     Problem List Patient Active Problem List   Diagnosis Date Noted   DDD (degenerative disc disease), thoracic 11/06/2020   Other idiopathic scoliosis, thoracolumbar region 11/06/2020   DDD (degenerative disc disease), lumbar 11/06/2020   Age-related osteoporosis without current pathological fracture 11/06/2020   Porokeratosis 11/26/2018   Corns and callosities 11/26/2018   History of right shoulder replacement 10/07/2016   Chronic pain of both knees 05/20/2016   Osteoarthritis of both hands 05/20/2016   Essential hypertension 05/20/2016   Environmental allergies 05/20/2016   Primary osteoarthritis of both knees 05/19/2016   Callus of foot 11/24/2012   Pain in joint, ankle and foot 09/15/2012    Reather Laurence, PT, DPT 03/05/2021, 10:59 AM  [facility] [place] [facility] [address] [place], [name], [address] Phone: [phone]   Fax:  [phone]  Name: CARSON MECHE MRN: [id number] Date of Birth: 28-Sep-1928

## 2021-03-06 ENCOUNTER — Ambulatory Visit: Payer: Medicare Other | Admitting: Specialist

## 2021-03-08 ENCOUNTER — Ambulatory Visit: Payer: Medicare Other | Admitting: Rehabilitative and Restorative Service Providers"

## 2021-03-12 ENCOUNTER — Ambulatory Visit: Payer: Medicare Other | Admitting: Rehabilitative and Restorative Service Providers"

## 2021-03-12 NOTE — Progress Notes (Deleted)
Office Visit Note  Patient: Danielle Dean             Date of Birth: August 07, 1928           MRN: [id number]             PCP: Georgann Housekeeper, MD Referring: Georgann Housekeeper, MD Visit Date: 03/26/2021 Occupation: @GUAROCC @  Subjective:  No chief complaint on file.   History of Present Illness: Danielle Dean is a 87 y.o. female ***   Activities of Daily Living:  Patient reports morning stiffness for *** {minute/hour:19697}.   Patient {ACTIONS;DENIES/REPORTS:21021675::"Denies"} nocturnal pain.  Difficulty dressing/grooming: {ACTIONS;DENIES/REPORTS:21021675::"Denies"} Difficulty climbing stairs: {ACTIONS;DENIES/REPORTS:21021675::"Denies"} Difficulty getting out of chair: {ACTIONS;DENIES/REPORTS:21021675::"Denies"} Difficulty using hands for taps, buttons, cutlery, and/or writing: {ACTIONS;DENIES/REPORTS:21021675::"Denies"}  No Rheumatology ROS completed.   PMFS History:  Patient Active Problem List   Diagnosis Date Noted   DDD (degenerative disc disease), thoracic 11/06/2020   Other idiopathic scoliosis, thoracolumbar region 11/06/2020   DDD (degenerative disc disease), lumbar 11/06/2020   Age-related osteoporosis without current pathological fracture 11/06/2020   Porokeratosis 11/26/2018   Corns and callosities 11/26/2018   History of right shoulder replacement 10/07/2016   Chronic pain of both knees 05/20/2016   Osteoarthritis of both hands 05/20/2016   Essential hypertension 05/20/2016   Environmental allergies 05/20/2016   Primary osteoarthritis of both knees 05/19/2016   Callus of foot 11/24/2012   Pain in joint, ankle and foot 09/15/2012    Past Medical History:  Diagnosis Date   Arthritis    Callus    Cataract fragments in both eyes following surgery    Corns and callosities    Hx of hysterectomy    Hypertension    Osteoarthritis     Family History  Problem Relation Age of Onset   Arthritis Mother    Diabetes Father    Past Surgical History:  Procedure  Laterality Date   ABDOMINAL HYSTERECTOMY     NASAL SEPTUM SURGERY     ROTATOR CUFF REPAIR Right    TOTAL SHOULDER ARTHROPLASTY     Social History   Social History Narrative   Not on file   Immunization History  Administered Date(s) Administered   PFIZER(Purple Top)SARS-COV-2 Vaccination 07/06/2019, 07/27/2019, 03/10/2020     Objective: Vital Signs: There were no vitals taken for this visit.   Physical Exam   Musculoskeletal Exam: ***  CDAI Exam: CDAI Score: -- Patient Global: --; Provider Global: -- Swollen: --; Tender: -- Joint Exam 03/26/2021   No joint exam has been documented for this visit   There is currently no information documented on the homunculus. Go to the Rheumatology activity and complete the homunculus joint exam.  Investigation: No additional findings.  Imaging: No results found.  Recent Labs: Lab Results  Component Value Date   WBC 6.4 12/20/2020   HGB 11.1 (L) 12/20/2020   PLT 177 12/20/2020   NA 127 (L) 01/09/2021   K 4.9 01/09/2021   CL 94 (L) 01/09/2021   CO2 25 01/09/2021   GLUCOSE 91 01/09/2021   BUN 20 01/09/2021   CREATININE 0.78 01/09/2021   BILITOT 0.6 01/09/2021   ALKPHOS 50 12/20/2020   AST 18 01/09/2021   ALT 12 01/09/2021   PROT 6.2 01/09/2021   ALBUMIN 3.6 12/20/2020   CALCIUM 9.1 01/09/2021   GFRAA 81 06/14/2020    Speciality Comments: Please get labs prior to Prolia injection.  Procedures:  No procedures performed Allergies: Actonel [risedronate], Fosamax [alendronate], and Lisinopril   Assessment / Plan:  Visit Diagnoses: No diagnosis found.  Orders: No orders of the defined types were placed in this encounter.  No orders of the defined types were placed in this encounter.   Face-to-face time spent with patient was *** minutes. Greater than 50% of time was spent in counseling and coordination of care.  Follow-Up Instructions: No follow-ups on file.   Ellen Henri, CMA  Note - This record has  been created using Animal nutritionist.  Chart creation errors have been sought, but may not always  have been located. Such creation errors do not reflect on  the standard of medical care.

## 2021-03-14 ENCOUNTER — Other Ambulatory Visit: Payer: Self-pay

## 2021-03-14 ENCOUNTER — Ambulatory Visit: Payer: Medicare Other | Admitting: Rehabilitative and Restorative Service Providers"

## 2021-03-14 ENCOUNTER — Encounter: Payer: Self-pay | Admitting: Rehabilitative and Restorative Service Providers"

## 2021-03-14 DIAGNOSIS — R262 Difficulty in walking, not elsewhere classified: Secondary | ICD-10-CM

## 2021-03-14 DIAGNOSIS — M25512 Pain in left shoulder: Secondary | ICD-10-CM

## 2021-03-14 DIAGNOSIS — R2689 Other abnormalities of gait and mobility: Secondary | ICD-10-CM | POA: Diagnosis not present

## 2021-03-14 DIAGNOSIS — M545 Low back pain, unspecified: Secondary | ICD-10-CM

## 2021-03-14 DIAGNOSIS — G8929 Other chronic pain: Secondary | ICD-10-CM

## 2021-03-14 NOTE — Therapy (Signed)
[facility] [facility] [address] [place], [name], [address] Phone: [phone]   Fax:  [phone]  Physical Therapy Treatment  Patient Details  Name: Danielle Dean MRN: [id number] Date of Birth: Oct 28, 1928 Referring Provider (PT): Pollyann Savoy, MD   Encounter Date: 03/14/2021   PT End of Session - 03/14/21 1105     Visit Number 15    Number of Visits 19    Date for PT Re-Evaluation 04/12/21    PT Start Time 1020    PT Stop Time 1100    PT Time Calculation (min) 40 min    Activity Tolerance Patient tolerated treatment well    Behavior During Therapy [facility] for tasks assessed/performed             Past Medical History:  Diagnosis Date   Arthritis    Callus    Cataract fragments in both eyes following surgery    Corns and callosities    Hx of hysterectomy    Hypertension    Osteoarthritis     Past Surgical History:  Procedure Laterality Date   ABDOMINAL HYSTERECTOMY     NASAL SEPTUM SURGERY     ROTATOR CUFF REPAIR Right    TOTAL SHOULDER ARTHROPLASTY      There were no vitals filed for this visit.   Subjective Assessment - 03/14/21 1057     Subjective Pt reports that she continues to have pain.  She states that she is due for a knee injection soon.    Patient Stated Goals less pain. no falls    Currently in Pain? Yes    Pain Score 4     Pain Location Back    Pain Orientation Lower    Pain Score 5    Pain Location Shoulder                               [facility] Adult PT Treatment/Exercise - 03/14/21 0001       Lumbar Exercises: Aerobic   UBE (Upper Arm Bike) level 1 x 4 minutes (2 min each way)    Nustep L5 x 6 BUE/LE      Lumbar Exercises: Machines for Strengthening   Other Lumbar Machine Exercise cybex row 10 # & lat pulldown 2x1510#,      Lumbar Exercises: Seated   Long Arc Quad on Chair Strengthening;Both;2 sets;10 reps    LAQ on Chair Weights (lbs) 3    Sit to Stand 10  reps   on AirEx with UE support     Knee/Hip Exercises: Seated   Ball Squeeze 20    Clamshell with TheraBand Red   2x15   Hamstring Curl Strengthening;Both;2 sets;15 reps    Hamstring Limitations red tband                       PT Short Term Goals - 03/01/21 1217       PT SHORT TERM GOAL #1   Title independent with initial HEP    Time 2    Period Weeks    Status Achieved    Target Date 01/21/21               PT Long Term Goals - 03/14/21 1142       PT LONG TERM GOAL #1   Title independent and safe with advanced HEP for maintenance of strength and falls prevention at home  Status On-going      PT LONG TERM GOAL #2   Title Improve 5TSTS to </= 12 seconds to demonstrate improved functional strength    Status On-going                   Plan - 03/14/21 1106     Clinical Impression Statement Pt continues to progress towards ther ex and improved strength.  She does have some pain during ther ex, but is able to progress with ther ex. She requires encouragement throughout session to continue to progress.    PT Treatment/Interventions ADLs/Self Care Home Management;Cryotherapy;Moist Heat;Electrical Stimulation;Gait training;Therapeutic exercise;Therapeutic activities;Functional mobility training;Balance training;Neuromuscular re-education;Patient/family education;Manual techniques    PT Next Visit Plan Functional back/core, UE/LE strength and flexibility. dynamic balance challenges as tolerated. manual & modalities as needed.    Consulted and Agree with Plan of Care Patient             Patient will benefit from skilled therapeutic intervention in order to improve the following deficits and impairments:  Decreased balance, Abnormal gait, Decreased strength, Impaired flexibility, Postural dysfunction, Pain, Decreased range of motion  Visit Diagnosis: Other abnormalities of gait and mobility  Acute bilateral low back pain without sciatica  Left  shoulder pain, unspecified chronicity  Chronic pain of left knee  Chronic pain of right knee  Difficulty in walking, not elsewhere classified     Problem List Patient Active Problem List   Diagnosis Date Noted   DDD (degenerative disc disease), thoracic 11/06/2020   Other idiopathic scoliosis, thoracolumbar region 11/06/2020   DDD (degenerative disc disease), lumbar 11/06/2020   Age-related osteoporosis without current pathological fracture 11/06/2020   Porokeratosis 11/26/2018   Corns and callosities 11/26/2018   History of right shoulder replacement 10/07/2016   Chronic pain of both knees 05/20/2016   Osteoarthritis of both hands 05/20/2016   Essential hypertension 05/20/2016   Environmental allergies 05/20/2016   Primary osteoarthritis of both knees 05/19/2016   Callus of foot 11/24/2012   Pain in joint, ankle and foot 09/15/2012    Reather Laurence, PT, DPT 03/14/2021, 11:44 AM  [facility] [address] [place], [name], [address] Phone: [phone]   Fax:  [phone]  Name: Danielle Dean MRN: [id number] Date of Birth: 1928/08/08

## 2021-03-19 ENCOUNTER — Ambulatory Visit: Payer: Medicare Other | Admitting: Physical Therapy

## 2021-03-21 ENCOUNTER — Other Ambulatory Visit: Payer: Self-pay

## 2021-03-21 ENCOUNTER — Ambulatory Visit: Payer: Medicare Other | Attending: Rheumatology | Admitting: Physical Therapy

## 2021-03-21 ENCOUNTER — Encounter: Payer: Self-pay | Admitting: Physical Therapy

## 2021-03-21 DIAGNOSIS — M25562 Pain in left knee: Secondary | ICD-10-CM | POA: Insufficient documentation

## 2021-03-21 DIAGNOSIS — M545 Low back pain, unspecified: Secondary | ICD-10-CM

## 2021-03-21 DIAGNOSIS — R262 Difficulty in walking, not elsewhere classified: Secondary | ICD-10-CM

## 2021-03-21 DIAGNOSIS — R2689 Other abnormalities of gait and mobility: Secondary | ICD-10-CM | POA: Diagnosis not present

## 2021-03-21 DIAGNOSIS — G8929 Other chronic pain: Secondary | ICD-10-CM | POA: Diagnosis present

## 2021-03-21 DIAGNOSIS — M25512 Pain in left shoulder: Secondary | ICD-10-CM

## 2021-03-21 DIAGNOSIS — M25561 Pain in right knee: Secondary | ICD-10-CM | POA: Diagnosis present

## 2021-03-21 NOTE — Therapy (Signed)
[facility] [address] [place], [name], [address] Phone: [phone]   Fax:  [phone]  Physical Therapy Treatment  Patient Details  Name: Danielle Dean MRN: [id number] Date of Birth: [birth year] Referring Provider (PT): Pollyann Savoy, MD   Encounter Date: 03/21/2021   PT End of Session - 03/21/21 1057     Visit Number 16    Number of Visits 19    Date for PT Re-Evaluation 04/12/21    PT Start Time 1018    PT Stop Time 1057    PT Time Calculation (min) 39 min    Activity Tolerance Patient tolerated treatment well    Behavior During Therapy WFL for tasks assessed/performed             Past Medical History:  Diagnosis Date   Arthritis    Callus    Cataract fragments in both eyes following surgery    Corns and callosities    Hx of hysterectomy    Hypertension    Osteoarthritis     Past Surgical History:  Procedure Laterality Date   ABDOMINAL HYSTERECTOMY     NASAL SEPTUM SURGERY     ROTATOR CUFF REPAIR Right    TOTAL SHOULDER ARTHROPLASTY      There were no vitals filed for this visit.   Subjective Assessment - 03/21/21 1021     Subjective Having discomfort in the knees and back today. Not sure why.    Pertinent History osteoporosis    Patient Stated Goals less pain. no falls    Currently in Pain? Yes    Pain Score 7     Pain Location Back    Pain Orientation Lower    Pain Descriptors / Indicators Discomfort    Pain Type Chronic pain    Pain Score 6    Pain Location Knee    Pain Orientation Right;Left    Pain Descriptors / Indicators Discomfort    Pain Type Chronic pain                               [facility] Adult PT Treatment/Exercise - 03/21/21 0001       Neuro Re-ed    Neuro Re-ed Details  march on foam x20; fwd/back wt shifts on foam 15x; sidestepping 4x 81ft with red TB above knees with B HHA; romberg + ball catch/throw in II bars, kicking ball in II bars with  B/single UE support x3 min   CGA     Lumbar Exercises: Aerobic   UBE (Upper Arm Bike) level 1 x 3 min fwd/back    Nustep L5 x 6 BUE/LE      Lumbar Exercises: Machines for Strengthening   Other Lumbar Machine Exercise cybex row 10x 5# & lat pulldown 10x10#,   discontinued rows d/t L shoulder crepitus     Knee/Hip Exercises: Seated   Sit to Sand without UE support;1 set;10 reps   sitting and standing on airex with red TB above knees                      PT Short Term Goals - 03/01/21 1217       PT SHORT TERM GOAL #1   Title independent with initial HEP    Time 2    Period Weeks    Status Achieved    Target Date 01/21/21  PT Long Term Goals - 03/14/21 1142       PT LONG TERM GOAL #1   Title independent and safe with advanced HEP for maintenance of strength and falls prevention at home    Status On-going      PT LONG TERM GOAL #2   Title Improve 5TSTS to </= 12 seconds to demonstrate improved functional strength    Status On-going                   Plan - 03/21/21 1057     Clinical Impression Statement Patient arrived to session with report of increased knee and LBP today without known cause. Tried to discuss importance of continuing fitness regimen with patient, however patient reporting several conflicts d/t lack of transportation, husband cannot be left home, and distance of gym from her home. Worked on STS transfers with focus on slow and controlled descent. Standing dynamic balance exercises required CGA and HHA d/t imbalance and fear of falling. Patient limited by L shoulder pain/crepitus with resisted back and shoulder strengthening. Able to perform dynamic catching/kicking activities in parallel bars with intermittent UE support required. Patient without complaints at end of session.    PT Treatment/Interventions ADLs/Self Care Home Management;Cryotherapy;Moist Heat;Electrical Stimulation;Gait training;Therapeutic  exercise;Therapeutic activities;Functional mobility training;Balance training;Neuromuscular re-education;Patient/family education;Manual techniques    PT Next Visit Plan Functional back/core, UE/LE strength and flexibility. dynamic balance challenges as tolerated. manual & modalities as needed.    Consulted and Agree with Plan of Care Patient             Patient will benefit from skilled therapeutic intervention in order to improve the following deficits and impairments:  Decreased balance, Abnormal gait, Decreased strength, Impaired flexibility, Postural dysfunction, Pain, Decreased range of motion  Visit Diagnosis: Other abnormalities of gait and mobility  Acute bilateral low back pain without sciatica  Left shoulder pain, unspecified chronicity  Chronic pain of left knee  Chronic pain of right knee  Difficulty in walking, not elsewhere classified     Problem List Patient Active Problem List   Diagnosis Date Noted   DDD (degenerative disc disease), thoracic 11/06/2020   Other idiopathic scoliosis, thoracolumbar region 11/06/2020   DDD (degenerative disc disease), lumbar 11/06/2020   Age-related osteoporosis without current pathological fracture 11/06/2020   Porokeratosis 11/26/2018   Corns and callosities 11/26/2018   History of right shoulder replacement 10/07/2016   Chronic pain of both knees 05/20/2016   Osteoarthritis of both hands 05/20/2016   Essential hypertension 05/20/2016   Environmental allergies 05/20/2016   Primary osteoarthritis of both knees 05/19/2016   Callus of foot 11/24/2012   Pain in joint, ankle and foot 09/15/2012     Anette Guarneri, PT, DPT 03/21/21 11:00 AM   [facility] [place] [facility] [address] [place], [name], [address] Phone: [phone]   Fax:  [phone]  Name: Danielle Dean MRN: [id number] Date of Birth: [birth year]

## 2021-03-25 NOTE — Progress Notes (Signed)
Office Visit Note  Patient: Danielle Dean             Date of Birth: 1929-05-10           MRN: [id number]             PCP: Georgann Housekeeper, MD Referring: Georgann Housekeeper, MD Visit Date: 04/08/2021 Occupation: @GUAROCC @  Subjective:  Pain in both knees and multiple joints.   History of Present Illness: Danielle Dean is a 87 y.o. female with osteoarthritis.  She continues to have pain and discomfort in the bilateral knee joints.  She requests repeat cortisone injection to her knee joints today.  She also has pain and discomfort in her bilateral shoulders, bilateral hands and her lower back.  She has been taking Tylenol twice a day which is not controlling her pain symptoms.  Activities of Daily Living:  Patient reports morning stiffness for 0 minutes.   Patient Reports nocturnal pain.  Difficulty dressing/grooming: Denies Difficulty climbing stairs: Reports Difficulty getting out of chair: Reports Difficulty using hands for taps, buttons, cutlery, and/or writing: Reports  Review of Systems  Constitutional:  Negative for fatigue.  HENT:  Negative for mouth sores, mouth dryness and nose dryness.   Eyes:  Positive for dryness. Negative for pain and itching.  Respiratory:  Negative for shortness of breath and difficulty breathing.   Cardiovascular:  Negative for chest pain and palpitations.  Gastrointestinal:  Negative for blood in stool, constipation and diarrhea.  Endocrine: Negative for increased urination.  Genitourinary:  Negative for difficulty urinating.  Musculoskeletal:  Positive for joint pain, joint pain, myalgias, muscle tenderness and myalgias. Negative for joint swelling and morning stiffness.  Skin:  Negative for color change, rash, redness and sensitivity to sunlight.  Allergic/Immunologic: Positive for susceptible to infections.  Neurological:  Positive for dizziness and numbness. Negative for headaches, memory loss and weakness.  Hematological:  Positive for  bruising/bleeding tendency.  Psychiatric/Behavioral:  Negative for confusion.    PMFS History:  Patient Active Problem List   Diagnosis Date Noted   DDD (degenerative disc disease), thoracic 11/06/2020   Other idiopathic scoliosis, thoracolumbar region 11/06/2020   DDD (degenerative disc disease), lumbar 11/06/2020   Age-related osteoporosis without current pathological fracture 11/06/2020   Porokeratosis 11/26/2018   Corns and callosities 11/26/2018   History of right shoulder replacement 10/07/2016   Chronic pain of both knees 05/20/2016   Osteoarthritis of both hands 05/20/2016   Essential hypertension 05/20/2016   Environmental allergies 05/20/2016   Primary osteoarthritis of both knees 05/19/2016   Callus of foot 11/24/2012   Pain in joint, ankle and foot 09/15/2012    Past Medical History:  Diagnosis Date   Arthritis    Callus    Cataract fragments in both eyes following surgery    Corns and callosities    Hx of hysterectomy    Hypertension    Osteoarthritis     Family History  Problem Relation Age of Onset   Arthritis Mother    Diabetes Father    Past Surgical History:  Procedure Laterality Date   ABDOMINAL HYSTERECTOMY     NASAL SEPTUM SURGERY     ROTATOR CUFF REPAIR Right    TOTAL SHOULDER ARTHROPLASTY     Social History   Social History Narrative   Not on file   Immunization History  Administered Date(s) Administered   PFIZER(Purple Top)SARS-COV-2 Vaccination 07/06/2019, 07/27/2019, 03/10/2020     Objective: Vital Signs: BP (!) 147/72 (BP Location: Left Arm, Patient  Position: Sitting, Cuff Size: Normal)   Pulse (!) 58   Ht 4' 7.25" (1.403 m)   Wt 103 lb 9.6 oz (47 kg)   BMI 23.86 kg/m    Physical Exam Vitals and nursing note reviewed.  Constitutional:      Appearance: She is well-developed.  HENT:     Head: Normocephalic and atraumatic.  Eyes:     Conjunctiva/sclera: Conjunctivae normal.  Cardiovascular:     Rate and Rhythm: Normal rate  and regular rhythm.     Heart sounds: Normal heart sounds.  Pulmonary:     Effort: Pulmonary effort is normal.     Breath sounds: Normal breath sounds.  Abdominal:     General: Bowel sounds are normal.     Palpations: Abdomen is soft.  Musculoskeletal:     Cervical back: Normal range of motion.  Lymphadenopathy:     Cervical: No cervical adenopathy.  Skin:    General: Skin is warm and dry.     Capillary Refill: Capillary refill takes less than 2 seconds.  Neurological:     Mental Status: She is alert and oriented to person, place, and time.  Psychiatric:        Behavior: Behavior normal.     Musculoskeletal Exam: She had limited lateral rotation of her cervical spine.  She had limited shoulder joint abduction for about 110 degrees on the right.  Left shoulder joint abduction was full.  She had limited internal rotation bilaterally.  Elbow joints with good range of motion.  She had bilateral PIP and DIP thickening with subluxation of several of her DIP joints.  Hip joints and knee joints with good range of motion.  She had no warmth swelling or effusion.  There was no tenderness over ankles or MTPs.  CDAI Exam: CDAI Score: -- Patient Global: --; Provider Global: -- Swollen: --; Tender: -- Joint Exam 04/08/2021   No joint exam has been documented for this visit   There is currently no information documented on the homunculus. Go to the Rheumatology activity and complete the homunculus joint exam.  Investigation: No additional findings.  Imaging: No results found.  Recent Labs: Lab Results  Component Value Date   WBC 6.4 12/20/2020   HGB 11.1 (L) 12/20/2020   PLT 177 12/20/2020   NA 127 (L) 01/09/2021   K 4.9 01/09/2021   CL 94 (L) 01/09/2021   CO2 25 01/09/2021   GLUCOSE 91 01/09/2021   BUN 20 01/09/2021   CREATININE 0.78 01/09/2021   BILITOT 0.6 01/09/2021   ALKPHOS 50 12/20/2020   AST 18 01/09/2021   ALT 12 01/09/2021   PROT 6.2 01/09/2021   ALBUMIN 3.6  12/20/2020   CALCIUM 9.1 01/09/2021   GFRAA 81 06/14/2020    Speciality Comments: Please get labs prior to Prolia injection.  Procedures:  Large Joint Inj: bilateral knee on 04/08/2021 11:44 AM Indications: pain Details: 27 G 1.5 in needle, medial approach  Arthrogram: No  Medications (Right): 1.5 mL lidocaine 1 %; 40 mg triamcinolone acetonide 40 MG/ML Aspirate (Right): 0 mL Medications (Left): 1.5 mL lidocaine 1 %; 40 mg triamcinolone acetonide 40 MG/ML Aspirate (Left): 0 mL Outcome: tolerated well, no immediate complications Procedure, treatment alternatives, risks and benefits explained, specific risks discussed. Consent was given by the patient. Immediately prior to procedure a time out was called to verify the correct patient, procedure, equipment, support staff and site/side marked as required. Patient was prepped and draped in the usual sterile fashion.  Allergies: Actonel [risedronate], Fosamax [alendronate], and Lisinopril   Assessment / Plan:     Visit Diagnoses: Primary osteoarthritis of both knees-she has severe osteoarthritis in her bilateral knee joints.  She was accompanied by her daughter today.  She requested cortisone injections to her bilateral knee joints.  She had no response to Visco supplement injections in the past.  After informed consent was obtained bilateral knee joints were injected with cortisone as described above.  She tolerated the procedure well.  Postprocedure instructions were given.  Chronic left shoulder pain-she has fairly good range of motion and chronic discomfort.  History of right shoulder replacement-she has limited range of motion and chronic discomfort.  Primary osteoarthritis of both hands-joint protection muscle strengthening was discussed.  Other idiopathic scoliosis, thoracolumbar region-chronic pain  DDD (degenerative disc disease), thoracic-chronic pain  DDD (degenerative disc disease), lumbar-chronic pain she takes Tylenol  twice daily.  We discussed the option of Cymbalta.  Indications side effects contraindications of Cymbalta were discussed.  She is willing to try Cymbalta.  She was placed on Cymbalta 20 mg p.o. daily.  If she tolerates the medication and notices some benefit then the dose of Cymbalta can be increased in the future.  Age-related osteoporosis without current pathological fracture -May 04, 2020 DEXA: 1/3 Left distal radius BMD 0.421 with T-score -4.6. prolia injection: 01/16/2021  Other fatigue-she continues to have fatigue due to generalized pain and also increases stress.  History of hypertension-blood pressure was mildly elevated today.  I advised her to monitor blood pressure closely after the cortisone injection.  Orders: Orders Placed This Encounter  Procedures   Large Joint Inj    Meds ordered this encounter  Medications   DULoxetine (CYMBALTA) 20 MG capsule    Sig: Take 1 capsule (20 mg total) by mouth daily.    Dispense:  30 capsule    Refill:  0      Follow-Up Instructions: Return in about 3 months (around 07/09/2021) for Osteoarthritis, Osteoporosis.   Pollyann Savoy, MD  Note - This record has been created using Animal nutritionist.  Chart creation errors have been sought, but may not always  have been located. Such creation errors do not reflect on  the standard of medical care.

## 2021-03-26 ENCOUNTER — Ambulatory Visit: Payer: Medicare Other | Admitting: Rheumatology

## 2021-03-26 DIAGNOSIS — M170 Bilateral primary osteoarthritis of knee: Secondary | ICD-10-CM

## 2021-03-26 DIAGNOSIS — M4125 Other idiopathic scoliosis, thoracolumbar region: Secondary | ICD-10-CM

## 2021-03-26 DIAGNOSIS — R5383 Other fatigue: Secondary | ICD-10-CM

## 2021-03-26 DIAGNOSIS — G8929 Other chronic pain: Secondary | ICD-10-CM

## 2021-03-26 DIAGNOSIS — M810 Age-related osteoporosis without current pathological fracture: Secondary | ICD-10-CM

## 2021-03-26 DIAGNOSIS — M5134 Other intervertebral disc degeneration, thoracic region: Secondary | ICD-10-CM

## 2021-03-26 DIAGNOSIS — M5136 Other intervertebral disc degeneration, lumbar region: Secondary | ICD-10-CM

## 2021-03-26 DIAGNOSIS — Z8679 Personal history of other diseases of the circulatory system: Secondary | ICD-10-CM

## 2021-03-26 DIAGNOSIS — Z96611 Presence of right artificial shoulder joint: Secondary | ICD-10-CM

## 2021-03-26 DIAGNOSIS — M19041 Primary osteoarthritis, right hand: Secondary | ICD-10-CM

## 2021-04-03 ENCOUNTER — Ambulatory Visit: Payer: Medicare Other | Admitting: Physical Therapy

## 2021-04-03 ENCOUNTER — Encounter: Payer: Self-pay | Admitting: Physical Therapy

## 2021-04-03 ENCOUNTER — Other Ambulatory Visit: Payer: Self-pay

## 2021-04-03 DIAGNOSIS — R2689 Other abnormalities of gait and mobility: Secondary | ICD-10-CM | POA: Diagnosis not present

## 2021-04-03 DIAGNOSIS — G8929 Other chronic pain: Secondary | ICD-10-CM

## 2021-04-03 DIAGNOSIS — M25512 Pain in left shoulder: Secondary | ICD-10-CM

## 2021-04-03 DIAGNOSIS — M545 Low back pain, unspecified: Secondary | ICD-10-CM

## 2021-04-03 NOTE — Therapy (Signed)
[facility]. [place], [place], [address] Phone: [phone]   Fax:  [phone]  Physical Therapy Treatment  Patient Details  Name: Danielle Dean MRN: [id number] Date of Birth: 1929-06-07 Referring Provider (PT): Bo Merino, MD   Encounter Date: 04/03/2021   PT End of Session - 04/03/21 1017     Visit Number 17    Number of Visits 19    Date for PT Re-Evaluation 04/12/21    Authorization Type MCR/MCD add KX    Progress Note Due on Visit 20    PT Start Time 1016    PT Stop Time 1103    PT Time Calculation (min) 47 min    Activity Tolerance Patient tolerated treatment well    Behavior During Therapy WFL for tasks assessed/performed             Past Medical History:  Diagnosis Date   Arthritis    Callus    Cataract fragments in both eyes following surgery    Corns and callosities    Hx of hysterectomy    Hypertension    Osteoarthritis     Past Surgical History:  Procedure Laterality Date   ABDOMINAL HYSTERECTOMY     NASAL SEPTUM SURGERY     ROTATOR CUFF REPAIR Right    TOTAL SHOULDER ARTHROPLASTY      There were no vitals filed for this visit.   Subjective Assessment - 04/03/21 1018     Subjective Patient reports once she gets up and starts moving her back and knees hurt, but not pain right now. Most back pain is on the right side.    Pertinent History osteoporosis    Patient Stated Goals less pain. no falls    Currently in Pain? No/denies                               [facility] Adult PT Treatment/Exercise - 04/03/21 0001       Lumbar Exercises: Aerobic   UBE (Upper Arm Bike) level 1 x 4 min fwd/back    Nustep L5 x 6 BUE/LE      Lumbar Exercises: Machines for Strengthening   Other Lumbar Machine Exercise cybex row 10x 10# & lat pulldown 20x10#,   discontinued rows d/t L shoulder crepitus     Lumbar Exercises: Standing   Row Strengthening;20 reps     Theraband Level (Row) Level 3 (Green);Level 2 (Red)   10 with green; 10 with red (red issued for HEP)   Row Limitations LOB in standing and cues to avoid leaning back; moved to sitting    Shoulder Extension Limitations attempted but pt unable to keep left elbow straight    Other Standing Lumbar Exercises on airex: balance with pertebations; step ups x 5 ea, marching x 10 (no LOB)      Lumbar Exercises: Seated   Long Arc Quad on Chair Strengthening;2 sets;10 reps    LAQ on Chair Weights (lbs) 3      Knee/Hip Exercises: Seated   Ball Squeeze 20    Marching Both;1 set;10 reps    Marching Limitations pillow behind back to help avoid rocking    Marching Weights 3 lbs.    Hamstring Limitations green    Sit to Sand 5 reps   on foam, requiring UE support  PT Education - 04/03/21 1240     Education Details seated row    Person(s) Educated Patient    Methods Explanation;Demonstration;Verbal cues;Handout    Comprehension Returned demonstration;Verbalized understanding              PT Short Term Goals - 03/01/21 1217       PT SHORT TERM GOAL #1   Title independent with initial HEP    Time 2    Period Weeks    Status Achieved    Target Date 01/21/21               PT Long Term Goals - 04/03/21 1022       PT LONG TERM GOAL #1   Title independent and safe with advanced HEP for maintenance of strength and falls prevention at home    Status Partially Met      PT LONG TERM GOAL #2   Title Improve 5TSTS to </= 12 seconds to demonstrate improved functional strength    Baseline 26.11 without UE    Status On-going      PT LONG TERM GOAL #3   Title report pain 50% less with cooking    Baseline no change in back pain with cooking    Status On-going      PT LONG TERM GOAL #4   Baseline -      PT LONG TERM GOAL #5   Title -                   Plan - 04/03/21 1021     Clinical Impression Statement Patient reporting no pain upon  arrival. She tolerated TE without complaint today. Still experiencing some crepitis in left shoulder with rows. Tolerated rows with band much better. Issued to HEP. She did experience LOB with standing rows, so advised to do in sitting. Patient continues to use equipment for balance when walking through clinic. She did pretty well with pertebations on foam. No LOB fwd. Bwd more challenging. Good ankle/hip strategies utilized. 5xS<>S retested today. It took her longer, but pt was able to sit to stand without UE support indicating improved strength.    PT Frequency 1x / week    PT Duration 6 weeks    PT Treatment/Interventions ADLs/Self Care Home Management;Cryotherapy;Moist Heat;Electrical Stimulation;Gait training;Therapeutic exercise;Therapeutic activities;Functional mobility training;Balance training;Neuromuscular re-education;Patient/family education;Manual techniques    PT Next Visit Plan Functional back/core, UE/LE strength and flexibility. dynamic balance challenges as tolerated. manual & modalities as needed.             Patient will benefit from skilled therapeutic intervention in order to improve the following deficits and impairments:  Decreased balance, Abnormal gait, Decreased strength, Impaired flexibility, Postural dysfunction, Pain, Decreased range of motion  Visit Diagnosis: Other abnormalities of gait and mobility  Acute bilateral low back pain without sciatica  Chronic pain of left knee  Chronic pain of right knee  Left shoulder pain, unspecified chronicity     Problem List Patient Active Problem List   Diagnosis Date Noted   DDD (degenerative disc disease), thoracic 11/06/2020   Other idiopathic scoliosis, thoracolumbar region 11/06/2020   DDD (degenerative disc disease), lumbar 11/06/2020   Age-related osteoporosis without current pathological fracture 11/06/2020   Porokeratosis 11/26/2018   Corns and callosities 11/26/2018   History of right shoulder  replacement 10/07/2016   Chronic pain of both knees 05/20/2016   Osteoarthritis of both hands 05/20/2016   Essential hypertension 05/20/2016   Environmental allergies 05/20/2016  Primary osteoarthritis of both knees 05/19/2016   Callus of foot 11/24/2012   Pain in joint, ankle and foot 09/15/2012   Madelyn Flavors PT 04/03/2021, 12:43 PM  [place]. [place], [place], [address] Phone: [phone]   Fax:  [phone]  Name: Danielle Dean MRN: [id number] Date of Birth: [birth year]

## 2021-04-03 NOTE — Patient Instructions (Signed)
  Strengthening: Chest Pull - Resisted   Resistive Band Rowing   DO IN SITTING  With resistive band anchored in door, grasp both ends. Keeping elbows bent, pull back, squeezing shoulder blades together. Hold _3-5___ seconds. Repeat _10-30___ times. Do ____ sessions per day.

## 2021-04-08 ENCOUNTER — Other Ambulatory Visit: Payer: Self-pay

## 2021-04-08 ENCOUNTER — Ambulatory Visit (INDEPENDENT_AMBULATORY_CARE_PROVIDER_SITE_OTHER): Payer: Medicare Other | Admitting: Rheumatology

## 2021-04-08 ENCOUNTER — Encounter: Payer: Self-pay | Admitting: Rheumatology

## 2021-04-08 VITALS — BP 147/72 | HR 58 | Ht <= 58 in | Wt 103.6 lb

## 2021-04-08 DIAGNOSIS — M19042 Primary osteoarthritis, left hand: Secondary | ICD-10-CM

## 2021-04-08 DIAGNOSIS — M810 Age-related osteoporosis without current pathological fracture: Secondary | ICD-10-CM

## 2021-04-08 DIAGNOSIS — M4125 Other idiopathic scoliosis, thoracolumbar region: Secondary | ICD-10-CM

## 2021-04-08 DIAGNOSIS — Z8679 Personal history of other diseases of the circulatory system: Secondary | ICD-10-CM

## 2021-04-08 DIAGNOSIS — R5383 Other fatigue: Secondary | ICD-10-CM

## 2021-04-08 DIAGNOSIS — M25512 Pain in left shoulder: Secondary | ICD-10-CM

## 2021-04-08 DIAGNOSIS — G8929 Other chronic pain: Secondary | ICD-10-CM

## 2021-04-08 DIAGNOSIS — Z96611 Presence of right artificial shoulder joint: Secondary | ICD-10-CM

## 2021-04-08 DIAGNOSIS — M5136 Other intervertebral disc degeneration, lumbar region: Secondary | ICD-10-CM

## 2021-04-08 DIAGNOSIS — M170 Bilateral primary osteoarthritis of knee: Secondary | ICD-10-CM

## 2021-04-08 DIAGNOSIS — M19041 Primary osteoarthritis, right hand: Secondary | ICD-10-CM | POA: Diagnosis not present

## 2021-04-08 DIAGNOSIS — M5134 Other intervertebral disc degeneration, thoracic region: Secondary | ICD-10-CM

## 2021-04-08 MED ORDER — TRIAMCINOLONE ACETONIDE 40 MG/ML IJ SUSP
40.0000 mg | INTRAMUSCULAR | Status: AC | PRN
  Administered 2021-04-08: 40 mg via INTRA_ARTICULAR

## 2021-04-08 MED ORDER — LIDOCAINE HCL 1 % IJ SOLN
1.5000 mL | INTRAMUSCULAR | Status: AC | PRN
  Administered 2021-04-08: 1.5 mL

## 2021-04-08 MED ORDER — DULOXETINE HCL 20 MG PO CPEP: 20.0000 mg | ORAL_CAPSULE | Freq: Every day | ORAL | 0 refills | Status: DC

## 2021-04-09 ENCOUNTER — Ambulatory Visit: Payer: Medicare Other | Admitting: Physical Therapy

## 2021-04-09 ENCOUNTER — Encounter: Payer: Self-pay | Admitting: Physical Therapy

## 2021-04-09 DIAGNOSIS — M545 Low back pain, unspecified: Secondary | ICD-10-CM

## 2021-04-09 DIAGNOSIS — R2689 Other abnormalities of gait and mobility: Secondary | ICD-10-CM | POA: Diagnosis not present

## 2021-04-09 DIAGNOSIS — M25561 Pain in right knee: Secondary | ICD-10-CM

## 2021-04-09 DIAGNOSIS — M25562 Pain in left knee: Secondary | ICD-10-CM

## 2021-04-09 DIAGNOSIS — M25512 Pain in left shoulder: Secondary | ICD-10-CM

## 2021-04-09 DIAGNOSIS — R262 Difficulty in walking, not elsewhere classified: Secondary | ICD-10-CM

## 2021-04-09 DIAGNOSIS — G8929 Other chronic pain: Secondary | ICD-10-CM

## 2021-04-09 NOTE — Therapy (Signed)
[facility]. [place], [place], [address] Phone: [phone]   Fax:  [phone]  Physical Therapy Treatment  Patient Details  Name: Danielle Dean MRN: [id number] Date of Birth: [birth year] Referring Provider (PT): Bo Merino, MD   Encounter Date: 04/09/2021   PT End of Session - 04/09/21 1039     Visit Number 18    Date for PT Re-Evaluation 04/12/21    PT Start Time 2111    PT Stop Time 1100    PT Time Calculation (min) 45 min    Activity Tolerance Patient tolerated treatment well    Behavior During Therapy [facility] for tasks assessed/performed             Past Medical History:  Diagnosis Date   Arthritis    Callus    Cataract fragments in both eyes following surgery    Corns and callosities    Hx of hysterectomy    Hypertension    Osteoarthritis     Past Surgical History:  Procedure Laterality Date   ABDOMINAL HYSTERECTOMY     NASAL SEPTUM SURGERY     ROTATOR CUFF REPAIR Right    TOTAL SHOULDER ARTHROPLASTY      There were no vitals filed for this visit.   Subjective Assessment - 04/09/21 1021     Subjective Patient had injections in the knees, so we cannot do exercises today.  She does reports that she has been able to do some walking with rollator in the neightborhood and in her driveway, she does report that she has stopped daily due to pain in the knees    Currently in Pain? Yes    Pain Score 6     Pain Location Knee    Pain Orientation Right;Left    Pain Descriptors / Indicators Aching;Sore    Pain Onset More than a month ago    Aggravating Factors  walking standing    Pain Relieving Factors had injection yesterday reports that felt good yesterday, but hurting more today                               [facility] Adult PT Treatment/Exercise - 04/09/21 0001       Ambulation/Gait   Gait Comments advised to wait 72 hours prior to walking since she got the injections       Lumbar Exercises: Seated   Other Seated Lumbar Exercises seated row, lats with red tband, shrugs, rolls, finger opposition      Manual Therapy   Manual Therapy Passive ROM    Passive ROM of fingers and hands                       PT Short Term Goals - 03/01/21 1217       PT SHORT TERM GOAL #1   Title independent with initial HEP    Time 2    Period Weeks    Status Achieved    Target Date 01/21/21               PT Long Term Goals - 04/09/21 1055       PT LONG TERM GOAL #1   Title independent and safe with advanced HEP for maintenance of strength and falls prevention at home    Status Achieved      PT LONG TERM GOAL #2   Title Improve 5TSTS to </= 12  seconds to demonstrate improved functional strength    Status Achieved      PT LONG TERM GOAL #3   Title report pain 50% less with cooking    Status Partially Met      PT LONG TERM GOAL #4   Title DGI improved to at least 19/24 to demonstrate decreased falls risk    Status Achieved                   Plan - 04/09/21 1039     Clinical Impression Statement Patient had knee injections yesterday so we are not to do much with her legs today, I worked more on her hands and arms and educated on her HEP, she reports that she will do but does report memory issues at times, still hurting today especially thye right knee, reports that she felt good yesterday after the injection but increased knee pain today bilaterally    PT Treatment/Interventions ADLs/Self Care Home Management;Cryotherapy;Moist Heat;Electrical Stimulation;Gait training;Therapeutic exercise;Therapeutic activities;Functional mobility training;Balance training;Neuromuscular re-education;Patient/family education;Manual techniques    PT Next Visit Plan D/C most goals met    Consulted and Agree with Plan of Care Patient             Patient will benefit from skilled therapeutic intervention in order to improve the following deficits  and impairments:  Decreased balance, Abnormal gait, Decreased strength, Impaired flexibility, Postural dysfunction, Pain, Decreased range of motion  Visit Diagnosis: Other abnormalities of gait and mobility  Acute bilateral low back pain without sciatica  Chronic pain of left knee  Chronic pain of right knee  Left shoulder pain, unspecified chronicity  Difficulty in walking, not elsewhere classified     Problem List Patient Active Problem List   Diagnosis Date Noted   DDD (degenerative disc disease), thoracic 11/06/2020   Other idiopathic scoliosis, thoracolumbar region 11/06/2020   DDD (degenerative disc disease), lumbar 11/06/2020   Age-related osteoporosis without current pathological fracture 11/06/2020   Porokeratosis 11/26/2018   Corns and callosities 11/26/2018   History of right shoulder replacement 10/07/2016   Chronic pain of both knees 05/20/2016   Osteoarthritis of both hands 05/20/2016   Essential hypertension 05/20/2016   Environmental allergies 05/20/2016   Primary osteoarthritis of both knees 05/19/2016   Callus of foot 11/24/2012   Pain in joint, ankle and foot 09/15/2012    Sumner Boast, PT 04/09/2021, 10:56 AM  [place]. [place], [place], [address] Phone: [phone]   Fax:  [phone]  Name: JANINA TRAFTON MRN: [id number] Date of Birth: 1929/01/12

## 2021-04-15 ENCOUNTER — Ambulatory Visit (INDEPENDENT_AMBULATORY_CARE_PROVIDER_SITE_OTHER): Payer: Medicare Other | Admitting: Podiatry

## 2021-04-15 ENCOUNTER — Other Ambulatory Visit: Payer: Self-pay

## 2021-04-15 ENCOUNTER — Encounter: Payer: Self-pay | Admitting: Podiatry

## 2021-04-15 ENCOUNTER — Other Ambulatory Visit (HOSPITAL_COMMUNITY): Payer: Self-pay

## 2021-04-15 DIAGNOSIS — Q828 Other specified congenital malformations of skin: Secondary | ICD-10-CM | POA: Diagnosis not present

## 2021-04-15 NOTE — Progress Notes (Signed)
Subjective:   Patient ID: Danielle Dean, female   DOB: 87 y.o.   MRN: [id number]   HPI Patient presents with painful lesions on both feet and is here with caregiver   ROS      Objective:  Physical Exam  Neurovascular status intact with thick keratotic lesions bilateral plantar fifth metatarsal and fifth digit right     Assessment:  Chronic keratotic lesions with porokeratotic-like appearance     Plan:  Sharp sterile debridement no iatrogenic bleeding this can be done as needed probably every 4 to 6 months

## 2021-05-10 ENCOUNTER — Other Ambulatory Visit: Payer: Self-pay | Admitting: Rheumatology

## 2021-05-10 DIAGNOSIS — M170 Bilateral primary osteoarthritis of knee: Secondary | ICD-10-CM

## 2021-05-10 DIAGNOSIS — M5136 Other intervertebral disc degeneration, lumbar region: Secondary | ICD-10-CM

## 2021-05-13 ENCOUNTER — Telehealth: Payer: Self-pay

## 2021-05-13 ENCOUNTER — Other Ambulatory Visit: Payer: Self-pay

## 2021-05-13 NOTE — Telephone Encounter (Signed)
See rx note for details.  °

## 2021-05-13 NOTE — Telephone Encounter (Signed)
Next Visit: 07/10/2021  Last Visit: 04/08/2021  Last Fill: 04/08/2021  Dx:  DDD (degenerative disc disease), lumbar-chronic pain   Current Dose per office note on 04/08/2021: Cymbalta 20 mg p.o. daily  Okay to refill Cymbalta?

## 2021-05-13 NOTE — Telephone Encounter (Signed)
Patient's daughter Renard Hamper left a voicemail stating her mom finished her prescription of Cymbalta and requested a return call to let her know if she is suppose to increase her dosage.

## 2021-05-14 MED ORDER — DULOXETINE HCL 30 MG PO CPEP: 30.0000 mg | ORAL_CAPSULE | Freq: Every day | ORAL | 2 refills | Status: DC

## 2021-05-14 NOTE — Telephone Encounter (Signed)
She may increase the dose of Cymbalta to 30 mg p.o. daily.  Please send total 30 tablets with 2 refills.

## 2021-05-14 NOTE — Telephone Encounter (Signed)
Patient's daughter advised we are increasing the Cymbalta to 30 mg daily and we are sending in a prescription to the pharmacy.

## 2021-05-14 NOTE — Telephone Encounter (Signed)
Patient's daughter contacted the office asking if patient's Cymbalta is going to be increased. Patient's daughter advised per last office note she was placed on Cymbalta 20 mg p.o. daily.  If she tolerates the medication and notices some benefit then the dose of Cymbalta can be increased in the future. Patient's daughter states she is tolerating the medication. She was not able to give an indication if the patient has noticed any improvement. Please advise.

## 2021-05-22 ENCOUNTER — Other Ambulatory Visit (HOSPITAL_COMMUNITY): Payer: Self-pay

## 2021-06-26 ENCOUNTER — Other Ambulatory Visit: Payer: Self-pay | Admitting: *Deleted

## 2021-06-26 ENCOUNTER — Other Ambulatory Visit (HOSPITAL_COMMUNITY): Payer: Self-pay

## 2021-06-26 MED ORDER — DULOXETINE HCL 20 MG PO CPEP: 20.0000 mg | ORAL_CAPSULE | Freq: Every day | ORAL | 2 refills | Status: DC

## 2021-06-26 NOTE — Progress Notes (Signed)
Office Visit Note  Patient: Danielle Dean             Date of Birth: [birth year]           MRN: [id number]             PCP: Wenda Low, MD Referring: Wenda Low, MD Visit Date: 07/10/2021 Occupation: @GUAROCC @  Subjective:  Left shoulder pain  History of Present Illness: ELINA BLASE is a 88 y.o. female with history of osteoarthritis and osteoporosis.  She states that she had some relief in her left shoulder with the cortisone injection which was given on January 13.  Although she still has significant discomfort raising her arm.  She also has discomfort reaching her back.  Right shoulder joint is replaced and is not causing much discomfort.  She continues to have some stiffness in her hands, lower back and her knees due to underlying osteoarthritis.  She denies any history of joint swelling.  Activities of Daily Living:  Patient reports morning stiffness for 0 minutes.   Patient Denies nocturnal pain.  Difficulty dressing/grooming: Reports Difficulty climbing stairs: Reports Difficulty getting out of chair: Reports Difficulty using hands for taps, buttons, cutlery, and/or writing: Reports  Review of Systems  Constitutional:  Negative for fatigue.  HENT:  Negative for mouth sores, mouth dryness and nose dryness.   Eyes:  Positive for dryness. Negative for pain and itching.  Respiratory:  Negative for shortness of breath and difficulty breathing.   Cardiovascular:  Negative for chest pain and palpitations.  Gastrointestinal:  Negative for blood in stool, constipation and diarrhea.  Endocrine: Negative for increased urination.  Genitourinary:  Negative for difficulty urinating.  Musculoskeletal:  Positive for joint pain, joint pain, myalgias and myalgias. Negative for joint swelling, morning stiffness and muscle tenderness.  Skin:  Negative for color change, rash and redness.  Allergic/Immunologic: Negative for susceptible to infections.  Neurological:  Positive for  numbness and memory loss. Negative for dizziness, headaches and weakness.  Hematological:  Negative for bruising/bleeding tendency.  Psychiatric/Behavioral:  Negative for confusion.    PMFS History:  Patient Active Problem List   Diagnosis Date Noted   DDD (degenerative disc disease), thoracic 11/06/2020   Other idiopathic scoliosis, thoracolumbar region 11/06/2020   DDD (degenerative disc disease), lumbar 11/06/2020   Age-related osteoporosis without current pathological fracture 11/06/2020   Porokeratosis 11/26/2018   Corns and callosities 11/26/2018   History of right shoulder replacement 10/07/2016   Chronic pain of both knees 05/20/2016   Osteoarthritis of both hands 05/20/2016   Essential hypertension 05/20/2016   Environmental allergies 05/20/2016   Primary osteoarthritis of both knees 05/19/2016   Callus of foot 11/24/2012   Pain in joint, ankle and foot 09/15/2012    Past Medical History:  Diagnosis Date   Arthritis    Callus    Cataract fragments in both eyes following surgery    Corns and callosities    Hx of hysterectomy    Hypertension    Osteoarthritis     Family History  Problem Relation Age of Onset   Arthritis Mother    Diabetes Father    Past Surgical History:  Procedure Laterality Date   ABDOMINAL HYSTERECTOMY     NASAL SEPTUM SURGERY     ROTATOR CUFF REPAIR Right    TOTAL SHOULDER ARTHROPLASTY     Social History   Social History Narrative   Not on file   Immunization History  Administered Date(s) Administered   PFIZER(Purple Top)SARS-COV-2 Vaccination  07/06/2019, 07/27/2019, 03/10/2020     Objective: Vital Signs: BP (!) 155/76 (BP Location: Left Arm, Patient Position: Sitting, Cuff Size: Normal)    Pulse 61    Wt 100 lb 3.2 oz (45.5 kg)    BMI 19.57 kg/m    Physical Exam Vitals and nursing note reviewed.  Constitutional:      Appearance: She is well-developed.  HENT:     Head: Normocephalic and atraumatic.  Eyes:      Conjunctiva/sclera: Conjunctivae normal.  Cardiovascular:     Rate and Rhythm: Normal rate and regular rhythm.     Heart sounds: Normal heart sounds.  Pulmonary:     Effort: Pulmonary effort is normal.     Breath sounds: Normal breath sounds.  Abdominal:     General: Bowel sounds are normal.     Palpations: Abdomen is soft.  Musculoskeletal:     Cervical back: Normal range of motion.  Lymphadenopathy:     Cervical: No cervical adenopathy.  Skin:    General: Skin is warm and dry.     Capillary Refill: Capillary refill takes less than 2 seconds.  Neurological:     Mental Status: She is alert and oriented to person, place, and time.  Psychiatric:        Behavior: Behavior normal.     Musculoskeletal Exam: C-spine was in good range of motion.  Right shoulder joint abduction was about 120 degrees without any discomfort.  She had full abduction and forward flexion of her right shoulder joint with discomfort.  She had limited internal rotation.  Elbow joints with good range of motion.  She had PIP and DIP thickening with subluxation with no synovitis.  Hip joints with good range of motion.  There was no tenderness over the knees or ankles.  CDAI Exam: CDAI Score: -- Patient Global: --; Provider Global: -- Swollen: --; Tender: -- Joint Exam 07/10/2021   No joint exam has been documented for this visit   There is currently no information documented on the homunculus. Go to the Rheumatology activity and complete the homunculus joint exam.  Investigation: No additional findings.  Imaging: XR Shoulder Left  Result Date: 06/28/2021 Glenohumeral joint space narrowing was noted.  Acromioclavicular joint space narrowing was noted.  No chondrocalcinosis was noted. Impression: These findings are consistent with osteoarthritis of the shoulder.   Recent Labs: Lab Results  Component Value Date   WBC 6.4 12/20/2020   HGB 11.1 (L) 12/20/2020   PLT 177 12/20/2020   NA 127 (L) 01/09/2021   K  4.9 01/09/2021   CL 94 (L) 01/09/2021   CO2 25 01/09/2021   GLUCOSE 91 01/09/2021   BUN 20 01/09/2021   CREATININE 0.78 01/09/2021   BILITOT 0.6 01/09/2021   ALKPHOS 50 12/20/2020   AST 18 01/09/2021   ALT 12 01/09/2021   PROT 6.2 01/09/2021   ALBUMIN 3.6 12/20/2020   CALCIUM 9.1 01/09/2021   GFRAA 81 06/14/2020    Speciality Comments: Prolia: 06/27/20, 01/16/21  Procedures:  No procedures performed Allergies: Actonel [risedronate], Fosamax [alendronate], and Lisinopril   Assessment / Plan:     Visit Diagnoses: Chronic left shoulder pain-she continues to have pain and discomfort in her left shoulder joint despite the cortisone injection.  She has had chronic pain in her left shoulder joint which is progressively getting worse.  She noticed some improvement after the cortisone injection.  She still had limited internal rotation.  I will refer her to physical therapy.  I also discussed  referral to orthopedics in case her symptoms persist.  History of right shoulder replacement-she had right total shoulder replacement and in [place] and had good results.  She has some limitation with abduction.  Primary osteoarthritis of both hands-she has severe osteoarthritis in her hands with PIP and DIP thickening and subluxation.  No synovitis was noted.  Primary osteoarthritis of both knees-she has chronic discomfort.  No warmth swelling or effusion was noted.  Other idiopathic scoliosis, thoracolumbar region  DDD (degenerative disc disease), thoracic-chronic pain  DDD (degenerative disc disease), lumbar-chronic pain  Age-related osteoporosis without current pathological fracture - May 04, 2020 DEXA: 1/3 Left distal radius BMD 0.421 with T-score -4.6. prolia injection: 01/16/2021 - Plan: Comprehensive metabolic panel, CBC with Differential/Platelet, VITAMIN D 25 Hydroxy (Vit-D Deficiency, Fractures)  Medication monitoring encounter - Plan: Comprehensive metabolic panel, CBC with  Differential/Platelet, VITAMIN D 25 Hydroxy (Vit-D Deficiency, Fractures)  Other fatigue  History of hypertension  Orders: Orders Placed This Encounter  Procedures   Comprehensive metabolic panel   CBC with Differential/Platelet   VITAMIN D 25 Hydroxy (Vit-D Deficiency, Fractures)   Ambulatory referral to Physical Therapy   No orders of the defined types were placed in this encounter.    Follow-Up Instructions: Return if symptoms worsen or fail to improve, for Osteoarthritis.   Bo Merino, MD  Note - This record has been created using Editor, commissioning.  Chart creation errors have been sought, but may not always  have been located. Such creation errors do not reflect on  the standard of medical care.

## 2021-06-26 NOTE — Telephone Encounter (Signed)
Asha advised we will send in prescription for the Cymbalta 20 mg daily.

## 2021-06-26 NOTE — Telephone Encounter (Signed)
Patient's daughter contacted the office and states the patient is on Cymbalta and was increased to the 30 mg a few weeks ago. She states her mother is more confused and doesn't feel like she is tolerating it very well. She states she tolerated the 20 mg well. She would like to know if you will send in a new prescription at the decreased dose of 20 mg. Please advise.   Ajay [phone]

## 2021-06-26 NOTE — Addendum Note (Signed)
Addended by: Henriette Combs on: 06/26/2021 04:44 PM   Modules accepted: Orders

## 2021-06-26 NOTE — Telephone Encounter (Signed)
Okay to send a prescription for Cymbalta 20 mg p.o. daily.

## 2021-06-27 NOTE — Progress Notes (Signed)
Office Visit Note  Patient: Danielle Dean             Date of Birth: August 27, 1928           MRN: [id number]             PCP: Wenda Low, MD Referring: Wenda Low, MD Visit Date: 06/28/2021 Occupation: @GUAROCC @  Subjective:  Left shoulder pain  History of Present Illness: Danielle Dean is a 88 y.o. female with history of osteoarthritis, DDD, and osteoporosis.  She was accompanied by her son today.  Patient states that she lost her husband last week.  The funeral is tomorrow.  She has been having pain and discomfort in her left shoulder joint for many years but has been worse in the last 2 or 3 days.  She is having difficulty raising her arm.  She tried over-the-counter pain meds and heating pad without much help.  She continues to have some discomfort in her knee joints and difficulty walking.  She is using braces on her knee joints.  Activities of Daily Living:  Patient reports morning stiffness for 1 hour.   Patient Reports nocturnal pain.  Difficulty dressing/grooming: Reports Difficulty climbing stairs: Reports Difficulty getting out of chair: Reports Difficulty using hands for taps, buttons, cutlery, and/or writing: Reports  Review of Systems  Constitutional:  Negative for fatigue.  HENT:  Negative for mouth sores, mouth dryness and nose dryness.   Eyes:  Positive for dryness. Negative for pain and itching.  Respiratory:  Negative for shortness of breath and difficulty breathing.   Cardiovascular:  Negative for chest pain and palpitations.  Gastrointestinal:  Positive for constipation. Negative for blood in stool and diarrhea.  Endocrine: Negative for increased urination.  Genitourinary:  Negative for difficulty urinating.  Musculoskeletal:  Positive for joint pain, joint pain, joint swelling, myalgias, morning stiffness, muscle tenderness and myalgias.  Skin:  Negative for color change, rash and redness.  Allergic/Immunologic: Negative for susceptible to  infections.  Neurological:  Positive for dizziness. Negative for numbness, headaches, memory loss and weakness.  Hematological:  Negative for bruising/bleeding tendency.  Psychiatric/Behavioral:  Negative for confusion.    PMFS History:  Patient Active Problem List   Diagnosis Date Noted   DDD (degenerative disc disease), thoracic 11/06/2020   Other idiopathic scoliosis, thoracolumbar region 11/06/2020   DDD (degenerative disc disease), lumbar 11/06/2020   Age-related osteoporosis without current pathological fracture 11/06/2020   Porokeratosis 11/26/2018   Corns and callosities 11/26/2018   History of right shoulder replacement 10/07/2016   Chronic pain of both knees 05/20/2016   Osteoarthritis of both hands 05/20/2016   Essential hypertension 05/20/2016   Environmental allergies 05/20/2016   Primary osteoarthritis of both knees 05/19/2016   Callus of foot 11/24/2012   Pain in joint, ankle and foot 09/15/2012    Past Medical History:  Diagnosis Date   Arthritis    Callus    Cataract fragments in both eyes following surgery    Corns and callosities    Hx of hysterectomy    Hypertension    Osteoarthritis     Family History  Problem Relation Age of Onset   Arthritis Mother    Diabetes Father    Past Surgical History:  Procedure Laterality Date   ABDOMINAL HYSTERECTOMY     NASAL SEPTUM SURGERY     ROTATOR CUFF REPAIR Right    TOTAL SHOULDER ARTHROPLASTY     Social History   Social History Narrative   Not on file  Immunization History  Administered Date(s) Administered   PFIZER(Purple Top)SARS-COV-2 Vaccination 07/06/2019, 07/27/2019, 03/10/2020     Objective: Vital Signs: BP (!) 144/81 (BP Location: Right Arm, Patient Position: Sitting, Cuff Size: Normal)    Pulse 63    Ht 5' (1.524 m)    Wt 102 lb 12.8 oz (46.6 kg)    BMI 20.08 kg/m    Physical Exam Vitals and nursing note reviewed.  Constitutional:      Appearance: She is well-developed.  HENT:      Head: Normocephalic and atraumatic.  Eyes:     Conjunctiva/sclera: Conjunctivae normal.  Pulmonary:     Effort: Pulmonary effort is normal.  Abdominal:     Palpations: Abdomen is soft.  Musculoskeletal:     Cervical back: Normal range of motion.  Skin:    General: Skin is warm and dry.     Capillary Refill: Capillary refill takes less than 2 seconds.  Neurological:     Mental Status: She is alert and oriented to person, place, and time.  Psychiatric:        Behavior: Behavior normal.     Musculoskeletal Exam: C-spine was in good range of motion.  She had limited left shoulder joint abduction and internal rotation.  Right shoulder joint was in full range of motion.  Elbow joints, wrist joints, MCPs PIPs and DIPs with good range of motion.  She had bilateral PIP and DIP thickening.  Hip joints and knee joints in good range of motion.  She had discomfort range of motion of her knee joints.  CDAI Exam: CDAI Score: -- Patient Global: --; Provider Global: -- Swollen: --; Tender: -- Joint Exam 06/28/2021   No joint exam has been documented for this visit   There is currently no information documented on the homunculus. Go to the Rheumatology activity and complete the homunculus joint exam.  Investigation: No additional findings.  Imaging: No results found.  Recent Labs: Lab Results  Component Value Date   WBC 6.4 12/20/2020   HGB 11.1 (L) 12/20/2020   PLT 177 12/20/2020   NA 127 (L) 01/09/2021   K 4.9 01/09/2021   CL 94 (L) 01/09/2021   CO2 25 01/09/2021   GLUCOSE 91 01/09/2021   BUN 20 01/09/2021   CREATININE 0.78 01/09/2021   BILITOT 0.6 01/09/2021   ALKPHOS 50 12/20/2020   AST 18 01/09/2021   ALT 12 01/09/2021   PROT 6.2 01/09/2021   ALBUMIN 3.6 12/20/2020   CALCIUM 9.1 01/09/2021   GFRAA 81 06/14/2020    Speciality Comments: Please get labs prior to Prolia injection.  Procedures:  Large Joint Inj: L glenohumeral on 06/28/2021 8:46 AM Indications:  pain Details: 27 G 1.5 in needle, posterior approach  Arthrogram: No  Medications: 1 mL lidocaine 1 %; 40 mg triamcinolone acetonide 40 MG/ML Aspirate: 0 mL Outcome: tolerated well, no immediate complications Procedure, treatment alternatives, risks and benefits explained, specific risks discussed. Consent was given by the patient. Immediately prior to procedure a time out was called to verify the correct patient, procedure, equipment, support staff and site/side marked as required. Patient was prepped and draped in the usual sterile fashion.    Allergies: Actonel [risedronate], Fosamax [alendronate], and Lisinopril   Assessment / Plan:     Visit Diagnoses: Chronic left shoulder pain -she has been having increased pain and discomfort in the left shoulder joint for the last 3 days.  She is having difficulty raising her arm.  She has tried over-the-counter medications and heating  pad without any relief.  She requests cortisone injection.  Plan: XR Shoulder Left.  X-rays were consistent with osteoarthritis.  Per patient's request left shoulder joint was injected with cortisone and lidocaine as described above.  She tolerated the procedure well.  Postprocedure instructions were given.  A handout on shoulder joint exercises was given  History of right shoulder replacement-doing well.  Primary osteoarthritis of both hands-she has bilateral PIP and DIP thickening with no synovitis.  Primary osteoarthritis of both knees - Severe osteoarthritis of both knees  Other idiopathic scoliosis, thoracolumbar region-she continues to have chronic back pain.  DDD (degenerative disc disease), thoracic-chronic pain  DDD (degenerative disc disease), lumbar-chronic pain  Age-related osteoporosis without current pathological fracture - May 04, 2020 DEXA: 1/3 Left distal radius BMD 0.421 with T-score -4.6. prolia injection: 01/16/2021  Other fatigue  History of hypertension-blood pressure is mildly  elevated.  She was advised to monitor blood pressure closely.  Orders: Orders Placed This Encounter  Procedures   Large Joint Inj   XR Shoulder Left   No orders of the defined types were placed in this encounter.     Follow-Up Instructions: Return if symptoms worsen or fail to improve, for Osteoarthritis.   Bo Merino, MD  Note - This record has been created using Editor, commissioning.  Chart creation errors have been sought, but may not always  have been located. Such creation errors do not reflect on  the standard of medical care.

## 2021-06-28 ENCOUNTER — Other Ambulatory Visit: Payer: Self-pay

## 2021-06-28 ENCOUNTER — Ambulatory Visit: Payer: Self-pay

## 2021-06-28 ENCOUNTER — Ambulatory Visit (INDEPENDENT_AMBULATORY_CARE_PROVIDER_SITE_OTHER): Payer: Medicare Other | Admitting: Rheumatology

## 2021-06-28 ENCOUNTER — Encounter: Payer: Self-pay | Admitting: Physician Assistant

## 2021-06-28 VITALS — BP 144/81 | HR 63 | Ht 60.0 in | Wt 102.8 lb

## 2021-06-28 DIAGNOSIS — M25512 Pain in left shoulder: Secondary | ICD-10-CM | POA: Diagnosis not present

## 2021-06-28 DIAGNOSIS — G8929 Other chronic pain: Secondary | ICD-10-CM | POA: Diagnosis not present

## 2021-06-28 DIAGNOSIS — Z96611 Presence of right artificial shoulder joint: Secondary | ICD-10-CM

## 2021-06-28 DIAGNOSIS — Z8679 Personal history of other diseases of the circulatory system: Secondary | ICD-10-CM

## 2021-06-28 DIAGNOSIS — M5134 Other intervertebral disc degeneration, thoracic region: Secondary | ICD-10-CM

## 2021-06-28 DIAGNOSIS — M4125 Other idiopathic scoliosis, thoracolumbar region: Secondary | ICD-10-CM

## 2021-06-28 DIAGNOSIS — M19041 Primary osteoarthritis, right hand: Secondary | ICD-10-CM

## 2021-06-28 DIAGNOSIS — M19042 Primary osteoarthritis, left hand: Secondary | ICD-10-CM

## 2021-06-28 DIAGNOSIS — M810 Age-related osteoporosis without current pathological fracture: Secondary | ICD-10-CM

## 2021-06-28 DIAGNOSIS — R5383 Other fatigue: Secondary | ICD-10-CM

## 2021-06-28 DIAGNOSIS — M170 Bilateral primary osteoarthritis of knee: Secondary | ICD-10-CM

## 2021-06-28 DIAGNOSIS — M5136 Other intervertebral disc degeneration, lumbar region: Secondary | ICD-10-CM

## 2021-06-28 MED ORDER — TRIAMCINOLONE ACETONIDE 40 MG/ML IJ SUSP
40.0000 mg | INTRAMUSCULAR | Status: AC | PRN
  Administered 2021-06-28: 40 mg via INTRA_ARTICULAR

## 2021-06-28 MED ORDER — LIDOCAINE HCL 1 % IJ SOLN
1.0000 mL | INTRAMUSCULAR | Status: AC | PRN
  Administered 2021-06-28: 1 mL

## 2021-06-28 NOTE — Patient Instructions (Signed)
Shoulder Exercises Ask your health care provider which exercises are safe for you. Do exercises exactly as told by your health care provider and adjust them as directed. It is normal to feel mild stretching, pulling, tightness, or discomfort as you do these exercises. Stop right away if you feel sudden pain or your pain gets worse. Do not begin these exercises until told by your health care provider. Stretching exercises External rotation and abduction This exercise is sometimes called corner stretch. This exercise rotates your arm outward (external rotation) and moves your arm out from your body (abduction). Stand in a doorway with one of your feet slightly in front of the other. This is called a staggered stance. If you cannot reach your forearms to the door frame, stand facing a corner of a room. Choose one of the following positions as told by your health care provider: Place your hands and forearms on the door frame above your head. Place your hands and forearms on the door frame at the height of your head. Place your hands on the door frame at the height of your elbows. Slowly move your weight onto your front foot until you feel a stretch across your chest and in the front of your shoulders. Keep your head and chest upright and keep your abdominal muscles tight. Hold for __________ seconds. To release the stretch, shift your weight to your back foot. Repeat __________ times. Complete this exercise __________ times a day. Extension, standing Stand and hold a broomstick, a cane, or a similar object behind your back. Your hands should be a little wider than shoulder width apart. Your palms should face away from your back. Keeping your elbows straight and your shoulder muscles relaxed, move the stick away from your body until you feel a stretch in your shoulders (extension). Avoid shrugging your shoulders while you move the stick. Keep your shoulder blades tucked down toward the middle of your  back. Hold for __________ seconds. Slowly return to the starting position. Repeat __________ times. Complete this exercise __________ times a day. Range-of-motion exercises Pendulum  Stand near a wall or a surface that you can hold onto for balance. Bend at the waist and let your left / right arm hang straight down. Use your other arm to support you. Keep your back straight and do not lock your knees. Relax your left / right arm and shoulder muscles, and move your hips and your trunk so your left / right arm swings freely. Your arm should swing because of the motion of your body, not because you are using your arm or shoulder muscles. Keep moving your hips and trunk so your arm swings in the following directions, as told by your health care provider: Side to side. Forward and backward. In clockwise and counterclockwise circles. Continue each motion for __________ seconds, or for as long as told by your health care provider. Slowly return to the starting position. Repeat __________ times. Complete this exercise __________ times a day. Shoulder flexion, standing  Stand and hold a broomstick, a cane, or a similar object. Place your hands a little more than shoulder width apart on the object. Your left / right hand should be palm up, and your other hand should be palm down. Keep your elbow straight and your shoulder muscles relaxed. Push the stick up with your healthy arm to raise your left / right arm in front of your body, and then over your head until you feel a stretch in your shoulder (flexion). Avoid   shrugging your shoulder while you raise your arm. Keep your shoulder blade tucked down toward the middle of your back. Hold for __________ seconds. Slowly return to the starting position. Repeat __________ times. Complete this exercise __________ times a day. Shoulder abduction, standing Stand and hold a broomstick, a cane, or a similar object. Place your hands a little more than shoulder  width apart on the object. Your left / right hand should be palm up, and your other hand should be palm down. Keep your elbow straight and your shoulder muscles relaxed. Push the object across your body toward your left / right side. Raise your left / right arm to the side of your body (abduction) until you feel a stretch in your shoulder. Do not raise your arm above shoulder height unless your health care provider tells you to do that. If directed, raise your arm over your head. Avoid shrugging your shoulder while you raise your arm. Keep your shoulder blade tucked down toward the middle of your back. Hold for __________ seconds. Slowly return to the starting position. Repeat __________ times. Complete this exercise __________ times a day. Internal rotation  Place your left / right hand behind your back, palm up. Use your other hand to dangle an exercise band, a towel, or a similar object over your shoulder. Grasp the band with your left / right hand so you are holding on to both ends. Gently pull up on the band until you feel a stretch in the front of your left / right shoulder. The movement of your arm toward the center of your body is called internal rotation. Avoid shrugging your shoulder while you raise your arm. Keep your shoulder blade tucked down toward the middle of your back. Hold for __________ seconds. Release the stretch by letting go of the band and lowering your hands. Repeat __________ times. Complete this exercise __________ times a day. Strengthening exercises External rotation  Sit in a stable chair without armrests. Secure an exercise band to a stable object at elbow height on your left / right side. Place a soft object, such as a folded towel or a small pillow, between your left / right upper arm and your body to move your elbow about 4 inches (10 cm) away from your side. Hold the end of the exercise band so it is tight and there is no slack. Keeping your elbow pressed  against the soft object, slowly move your forearm out, away from your abdomen (external rotation). Keep your body steady so only your forearm moves. Hold for __________ seconds. Slowly return to the starting position. Repeat __________ times. Complete this exercise __________ times a day. Shoulder abduction  Sit in a stable chair without armrests, or stand up. Hold a __________ weight in your left / right hand, or hold an exercise band with both hands. Start with your arms straight down and your left / right palm facing in, toward your body. Slowly lift your left / right hand out to your side (abduction). Do not lift your hand above shoulder height unless your health care provider tells you that this is safe. Keep your arms straight. Avoid shrugging your shoulder while you do this movement. Keep your shoulder blade tucked down toward the middle of your back. Hold for __________ seconds. Slowly lower your arm, and return to the starting position. Repeat __________ times. Complete this exercise __________ times a day. Shoulder extension Sit in a stable chair without armrests, or stand up. Secure an exercise band   to a stable object in front of you so it is at shoulder height. Hold one end of the exercise band in each hand. Your palms should face each other. Straighten your elbows and lift your hands up to shoulder height. Step back, away from the secured end of the exercise band, until the band is tight and there is no slack. Squeeze your shoulder blades together as you pull your hands down to the sides of your thighs (extension). Stop when your hands are straight down by your sides. Do not let your hands go behind your body. Hold for __________ seconds. Slowly return to the starting position. Repeat __________ times. Complete this exercise __________ times a day. Shoulder row Sit in a stable chair without armrests, or stand up. Secure an exercise band to a stable object in front of you so it  is at waist height. Hold one end of the exercise band in each hand. Position your palms so that your thumbs are facing the ceiling (neutral position). Bend each of your elbows to a 90-degree angle (right angle) and keep your upper arms at your sides. Step back until the band is tight and there is no slack. Slowly pull your elbows back behind you. Hold for __________ seconds. Slowly return to the starting position. Repeat __________ times. Complete this exercise __________ times a day. Shoulder press-ups  Sit in a stable chair that has armrests. Sit upright, with your feet flat on the floor. Put your hands on the armrests so your elbows are bent and your fingers are pointing forward. Your hands should be about even with the sides of your body. Push down on the armrests and use your arms to lift yourself off the chair. Straighten your elbows and lift yourself up as much as you comfortably can. Move your shoulder blades down, and avoid letting your shoulders move up toward your ears. Keep your feet on the ground. As you get stronger, your feet should support less of your body weight as you lift yourself up. Hold for __________ seconds. Slowly lower yourself back into the chair. Repeat __________ times. Complete this exercise __________ times a day. Wall push-ups  Stand so you are facing a stable wall. Your feet should be about one arm-length away from the wall. Lean forward and place your palms on the wall at shoulder height. Keep your feet flat on the floor as you bend your elbows and lean forward toward the wall. Hold for __________ seconds. Straighten your elbows to push yourself back to the starting position. Repeat __________ times. Complete this exercise __________ times a day. This information is not intended to replace advice given to you by your health care provider. Make sure you discuss any questions you have with your healthcare provider. Document Revised: 09/24/2018 Document  Reviewed: 07/02/2018 Elsevier Patient Education  2022 [employer]  

## 2021-07-10 ENCOUNTER — Telehealth: Payer: Self-pay | Admitting: Pharmacist

## 2021-07-10 ENCOUNTER — Ambulatory Visit (INDEPENDENT_AMBULATORY_CARE_PROVIDER_SITE_OTHER): Payer: Medicare Other | Admitting: Rheumatology

## 2021-07-10 ENCOUNTER — Other Ambulatory Visit (HOSPITAL_COMMUNITY): Payer: Self-pay

## 2021-07-10 ENCOUNTER — Encounter: Payer: Self-pay | Admitting: Rheumatology

## 2021-07-10 ENCOUNTER — Other Ambulatory Visit: Payer: Self-pay

## 2021-07-10 VITALS — BP 155/76 | HR 61 | Wt 100.2 lb

## 2021-07-10 DIAGNOSIS — M4125 Other idiopathic scoliosis, thoracolumbar region: Secondary | ICD-10-CM

## 2021-07-10 DIAGNOSIS — Z96611 Presence of right artificial shoulder joint: Secondary | ICD-10-CM | POA: Diagnosis not present

## 2021-07-10 DIAGNOSIS — M19042 Primary osteoarthritis, left hand: Secondary | ICD-10-CM

## 2021-07-10 DIAGNOSIS — M25512 Pain in left shoulder: Secondary | ICD-10-CM

## 2021-07-10 DIAGNOSIS — Z5181 Encounter for therapeutic drug level monitoring: Secondary | ICD-10-CM

## 2021-07-10 DIAGNOSIS — M5134 Other intervertebral disc degeneration, thoracic region: Secondary | ICD-10-CM

## 2021-07-10 DIAGNOSIS — M810 Age-related osteoporosis without current pathological fracture: Secondary | ICD-10-CM

## 2021-07-10 DIAGNOSIS — M19041 Primary osteoarthritis, right hand: Secondary | ICD-10-CM

## 2021-07-10 DIAGNOSIS — M170 Bilateral primary osteoarthritis of knee: Secondary | ICD-10-CM

## 2021-07-10 DIAGNOSIS — G8929 Other chronic pain: Secondary | ICD-10-CM | POA: Diagnosis not present

## 2021-07-10 DIAGNOSIS — R5383 Other fatigue: Secondary | ICD-10-CM

## 2021-07-10 DIAGNOSIS — Z8679 Personal history of other diseases of the circulatory system: Secondary | ICD-10-CM

## 2021-07-10 DIAGNOSIS — M5136 Other intervertebral disc degeneration, lumbar region: Secondary | ICD-10-CM

## 2021-07-10 NOTE — Telephone Encounter (Addendum)
Patient due for Prolia on or after 07/15/21. She has OV on 07/10/21 with Dr. Estanislado Pandy and will need labs drawn at that visit.  Once labs result and if wnl, rx will be sent to [facility] to be couriered to clinic.  Per test claim, patient's copay through pharmacy benefit is $4.30  Prolia appt scheduled for 07/18/21 - patient states it has to be Thursday or Friday since she coordinates with her daughter for transportation.  Knox Saliva, PharmD, MPH, BCPS Clinical Pharmacist (Rheumatology and Pulmonology)

## 2021-07-11 LAB — CBC WITH DIFFERENTIAL/PLATELET
Absolute Monocytes: 1163 cells/uL — ABNORMAL HIGH (ref 200–950)
Basophils Absolute: 51 cells/uL (ref 0–200)
Basophils Relative: 0.5 %
Eosinophils Absolute: 122 cells/uL (ref 15–500)
Eosinophils Relative: 1.2 %
HCT: 36.7 % (ref 35.0–45.0)
Hemoglobin: 12.2 g/dL (ref 11.7–15.5)
Lymphs Abs: 1948 cells/uL (ref 850–3900)
MCH: 30.2 pg (ref 27.0–33.0)
MCHC: 33.2 g/dL (ref 32.0–36.0)
MCV: 90.8 fL (ref 80.0–100.0)
MPV: 9.1 fL (ref 7.5–12.5)
Monocytes Relative: 11.4 %
Neutro Abs: 6916 cells/uL (ref 1500–7800)
Neutrophils Relative %: 67.8 %
Platelets: 308 10*3/uL (ref 140–400)
RBC: 4.04 10*6/uL (ref 3.80–5.10)
RDW: 12.3 % (ref 11.0–15.0)
Total Lymphocyte: 19.1 %
WBC: 10.2 10*3/uL (ref 3.8–10.8)

## 2021-07-11 LAB — COMPREHENSIVE METABOLIC PANEL
AG Ratio: 1.8 (calc) (ref 1.0–2.5)
ALT: 14 U/L (ref 6–29)
AST: 17 U/L (ref 10–35)
Albumin: 4.2 g/dL (ref 3.6–5.1)
Alkaline phosphatase (APISO): 65 U/L (ref 37–153)
BUN: 20 mg/dL (ref 7–25)
CO2: 27 mmol/L (ref 20–32)
Calcium: 9.1 mg/dL (ref 8.6–10.4)
Chloride: 89 mmol/L — ABNORMAL LOW (ref 98–110)
Creat: 0.68 mg/dL (ref 0.60–0.95)
Globulin: 2.3 g/dL (calc) (ref 1.9–3.7)
Glucose, Bld: 89 mg/dL (ref 65–99)
Potassium: 5.2 mmol/L (ref 3.5–5.3)
Sodium: 123 mmol/L — ABNORMAL LOW (ref 135–146)
Total Bilirubin: 0.8 mg/dL (ref 0.2–1.2)
Total Protein: 6.5 g/dL (ref 6.1–8.1)

## 2021-07-11 LAB — VITAMIN D 25 HYDROXY (VIT D DEFICIENCY, FRACTURES): Vit D, 25-Hydroxy: 55 ng/mL (ref 30–100)

## 2021-07-16 ENCOUNTER — Other Ambulatory Visit (HOSPITAL_COMMUNITY): Payer: Self-pay

## 2021-07-16 MED ORDER — DENOSUMAB 60 MG/ML ~~LOC~~ SOSY
60.0000 mg | PREFILLED_SYRINGE | SUBCUTANEOUS | 0 refills | Status: DC
  Filled 2021-07-16: qty 1, 180d supply, fill #0

## 2021-07-18 ENCOUNTER — Other Ambulatory Visit: Payer: Self-pay

## 2021-07-18 ENCOUNTER — Ambulatory Visit (INDEPENDENT_AMBULATORY_CARE_PROVIDER_SITE_OTHER): Payer: Medicare Other | Admitting: Pharmacist

## 2021-07-18 VITALS — BP 147/79 | HR 54

## 2021-07-18 DIAGNOSIS — M810 Age-related osteoporosis without current pathological fracture: Secondary | ICD-10-CM | POA: Diagnosis not present

## 2021-07-18 MED ORDER — DENOSUMAB 60 MG/ML ~~LOC~~ SOSY
60.0000 mg | PREFILLED_SYRINGE | Freq: Once | SUBCUTANEOUS | Status: AC
  Administered 2021-07-18: 60 mg via SUBCUTANEOUS
  Filled 2021-07-18: qty 1

## 2021-07-18 NOTE — Progress Notes (Signed)
Pharmacy Note  Subjective:   Patient presents to clinic today to receive bi-annual dose of Prolia. Her daughter, Rudi Rummage, has accompanied patient  Patient running a fever or have signs/symptoms of infection? No  Patient currently on antibiotics for the treatment of infection? No  Patient had fall in the last 6 months?  No     Patient taking calcium 1200 mg daily through diet or supplement and at least 800 units vitamin D? Yes  Objective: CMP     Component Value Date/Time   NA 123 (L) 07/10/2021 1211   K 5.2 07/10/2021 1211   CL 89 (L) 07/10/2021 1211   CO2 27 07/10/2021 1211   GLUCOSE 89 07/10/2021 1211   BUN 20 07/10/2021 1211   CREATININE 0.68 07/10/2021 1211   CALCIUM 9.1 07/10/2021 1211   PROT 6.5 07/10/2021 1211   ALBUMIN 3.6 12/20/2020 1422   AST 17 07/10/2021 1211   ALT 14 07/10/2021 1211   ALKPHOS 50 12/20/2020 1422   BILITOT 0.8 07/10/2021 1211   GFRNONAA >60 12/20/2020 1422   GFRNONAA 70 06/14/2020 0000   GFRAA 81 06/14/2020 0000    CBC    Component Value Date/Time   WBC 10.2 07/10/2021 1211   RBC 4.04 07/10/2021 1211   HGB 12.2 07/10/2021 1211   HCT 36.7 07/10/2021 1211   PLT 308 07/10/2021 1211   MCV 90.8 07/10/2021 1211   MCH 30.2 07/10/2021 1211   MCHC 33.2 07/10/2021 1211   RDW 12.3 07/10/2021 1211   LYMPHSABS 1,948 07/10/2021 1211   MONOABS 0.8 12/20/2020 1422   EOSABS 122 07/10/2021 1211   BASOSABS 51 07/10/2021 1211    Lab Results  Component Value Date   VD25OH 55 07/10/2021    T-score: May 04, 2020 DEXA: 1/3 Left distal radius BMD 0.421 with T-score -4.6.  Assessment/Plan:   Patient tolerated injection well.    Administrations This Visit     denosumab (PROLIA) injection 60 mg     Admin Date 07/18/2021 Action Given Dose 60 mg Route Subcutaneous Administered By Cassandria Anger, [facility]           Patient's daughter states that she'd like to consider switching to Reclast at next office visit for convenience. F/u appt  scheduled for 6 months. They would like for patients to have labs completed prior to appt and rx can be couriered to clinic to be administered at [place]. Will notate in [employer]   All questions encouraged and answered.  Instructed patient to call with any further questions or concerns.  Knox Saliva, PharmD, MPH, BCPS Clinical Pharmacist (Rheumatology and Pulmonology)

## 2021-07-30 ENCOUNTER — Ambulatory Visit: Payer: Medicare Other | Attending: Rheumatology | Admitting: Physical Therapy

## 2021-07-30 ENCOUNTER — Other Ambulatory Visit: Payer: Self-pay

## 2021-07-30 ENCOUNTER — Encounter: Payer: Self-pay | Admitting: Physical Therapy

## 2021-07-30 DIAGNOSIS — M25512 Pain in left shoulder: Secondary | ICD-10-CM | POA: Diagnosis present

## 2021-07-30 DIAGNOSIS — M25612 Stiffness of left shoulder, not elsewhere classified: Secondary | ICD-10-CM | POA: Insufficient documentation

## 2021-07-30 DIAGNOSIS — R2681 Unsteadiness on feet: Secondary | ICD-10-CM | POA: Diagnosis present

## 2021-07-30 DIAGNOSIS — G8929 Other chronic pain: Secondary | ICD-10-CM | POA: Diagnosis not present

## 2021-07-30 DIAGNOSIS — R293 Abnormal posture: Secondary | ICD-10-CM | POA: Insufficient documentation

## 2021-07-30 DIAGNOSIS — M6281 Muscle weakness (generalized): Secondary | ICD-10-CM | POA: Diagnosis present

## 2021-07-30 NOTE — Therapy (Signed)
[place]. [place], [place], [address] Phone: [phone]   Fax:  [phone]  Physical Therapy Evaluation  Patient Details  Name: Danielle Dean MRN: [id number] Date of Birth: 1928/11/24 Referring Provider (PT): Deveshwar   Encounter Date: 07/30/2021   PT End of Session - 07/30/21 1157     Visit Number 1    Number of Visits 17    Date for PT Re-Evaluation 09/24/21    Authorization Type MCR and MCD    Authorization Time Period 07/30/21 to 09/24/21    Progress Note Due on Visit 10    PT Start Time 1101    PT Stop Time 1146    PT Time Calculation (min) 45 min    Activity Tolerance Patient tolerated treatment well    Behavior During Therapy WFL for tasks assessed/performed             Past Medical History:  Diagnosis Date   Arthritis    Callus    Cataract fragments in both eyes following surgery    Corns and callosities    Hx of hysterectomy    Hypertension    Osteoarthritis     Past Surgical History:  Procedure Laterality Date   ABDOMINAL HYSTERECTOMY     NASAL SEPTUM SURGERY     ROTATOR CUFF REPAIR Right    TOTAL SHOULDER ARTHROPLASTY      There were no vitals filed for this visit.    Subjective Assessment - 07/30/21 1101     Subjective I'm having issues with my left shoulder; its hard for me to brush my hair, dress, etc. It started getting hard to do this about a month ago, my doctor told me it was a tendonitis. I feel like my left arm started having trouble after I had right shoulder surgery. I'd like to avoid surgery. No falls recently but I know I'm not steady on my feet, I use a rollator when I walk outside and I'll also use my rollator if I'm walking far distances inside. I'm still concerned about my scolios and knees.    Patient Stated Goals feel in better general, avoid surgery    Currently in Pain? No/denies   when I start doing things (changing clothes, pain is not to the point that  I can't do things but it does scare me)               [facility] PT Assessment - 07/30/21 0001       Assessment   Medical Diagnosis L shoulder pain    Referring Provider (PT) Deveshwar    Onset Date/Surgical Date --   chronic   Next MD Visit PRN    Prior Therapy here in the past      Precautions   Precautions None      Restrictions   Weight Bearing Restrictions No      Balance Screen   Has the patient fallen in the past 6 months Yes    How many times? fell when going to get mail    Has the patient had a decrease in activity level because of a fear of falling?  No    Is the patient reluctant to leave their home because of a fear of falling?  No      Prior Function   Level of Independence Independent;Independent with basic ADLs   does have assistance from caregivers 3-4 hours a day plus lives with son   Danielle Dean Retired  Leisure walking, otherwise sits with heating pad, reading, word searches      Observation/Other Assessments   Focus on Therapeutic Outcomes (FOTO)  50      Posture/Postural Control   Posture/Postural Control Postural limitations    Postural Limitations Rounded Shoulders;Forward head;Increased thoracic kyphosis;Flexed trunk      ROM / Strength   AROM / PROM / Strength AROM;Strength      AROM   AROM Assessment Site Shoulder    Right/Left Shoulder Right;Left    Left Shoulder Flexion 120 Degrees   approximate   Left Shoulder ABduction 70 Degrees   approximate   Left Shoulder Internal Rotation --   FIR L5/S1   Left Shoulder External Rotation --   FER nape of neck     Strength   Strength Assessment Site Shoulder;Elbow    Right/Left Shoulder Right;Left    Right Shoulder Flexion 4-/5    Right Shoulder ABduction 4-/5    Right Shoulder Internal Rotation 3/5    Right Shoulder External Rotation 3/5    Left Shoulder Flexion 3-/5    Left Shoulder ABduction 3-/5    Left Shoulder Internal Rotation 3/5    Left Shoulder External Rotation 3-/5    Right/Left  Elbow Right;Left    Right Elbow Flexion 5/5    Right Elbow Extension 3/5    Left Elbow Flexion 4+/5    Left Elbow Extension 3-/5      Palpation   Palpation comment mm trigger points noted in triceps, deltoid, upper traps L side      Special Tests   Other special tests unable to hold SLS or tandem stance more than 1-2 seconds B                        Objective measurements completed on examination: See above findings.       [facility] Adult PT Treatment/Exercise - 07/30/21 0001       Exercises   Exercises Shoulder      Shoulder Exercises: Seated   Row Strengthening;Both;10 reps    Theraband Level (Shoulder Row) Level 1 (Yellow)    Other Seated Exercises backwards shoulder rolls, TA sets                     PT Education - 07/30/21 1156     Education Details exam findings, POC, HEP moving forward; role of rest and reasonable activities in protecting joints vs targeted safe exercises to tolerance in improving function    Person(s) Educated Patient    Methods Explanation;Demonstration;Handout    Comprehension Verbalized understanding;Returned demonstration;Need further instruction              PT Short Term Goals - 07/30/21 1201       PT SHORT TERM GOAL #1   Title Will be independent with appropriate HEP, to be progressed PRN    Time 4    Period Weeks    Status New    Target Date 08/27/21      PT SHORT TERM GOAL #2   Title Will have no more than 3/10 in L shoulder in order to improve functional task tolerance    Time 4    Period Weeks    Status New      PT SHORT TERM GOAL #3   Title Will have better understanding of posture and will be able to self correct at least 50% of the time on an independent basis    Time 4  Period Weeks    Status New      PT SHORT TERM GOAL #4   Title L shoulder ROM to be generally symmetrical that of the R in order to improve ease of functional task performance    Time 4    Period Weeks    Status New                PT Long Term Goals - 07/30/21 1203       PT LONG TERM GOAL #1   Title MMT to have improved by at least 1 grade in order to reduce pain and improve functional task performance    Time 8    Period Weeks    Status New    Target Date 09/24/21      PT LONG TERM GOAL #2   Title Will be able to maintain SLS for at least 5-10 seconds B and tandem stance at least 20 seconds B to show improved balance skills    Time 8    Period Weeks    Status New      PT LONG TERM GOAL #3   Title Will be able to lift a 5-8# object to shoulder heigh to allow her to perform functional tasks at home (such as taking a bottle of milk out of the fridge) without increased pain    Time 8    Period Weeks    Status New      PT LONG TERM GOAL #4   Title Will be able to perform functional tasks such as dressing, doing hair, bathing without increase in L shoulder pain    Time 8    Period Weeks    Status New                    Plan - 07/30/21 1158     Clinical Impression Statement Danielle Dean arrives today doing OK- having increased left shoulder pain, tells me it is very hard for her to dress and do her hair due to pain; lives with her son, has caregivers at home but she does try to do what she can when they are not there, otherwise she is mostly sedentary. Exam reveals postural limitations, limited shoulder ROM and strength, impaired general functional activity tolerance, and poor balance skills. Of note, her husband did pass away recently, and Danielle Dean tells me ever since this happened I just dont feel like doing much most of the time. Will benefit from skilled PT services to address functional limitations, reduce fall risk, and improve QOL moving forward.    Personal Factors and Comorbidities Age;Fitness;Comorbidity 1;Time since onset of injury/illness/exacerbation    Examination-Activity Limitations Bathing;Reach Overhead;Bed Mobility;Caring for  Others;Carry;Dressing;Hygiene/Grooming;Lift    Examination-Participation Restrictions Church;Meal Prep;Cleaning;Community Activity;Shop;Laundry;Yard Work    Stability/Clinical Decision Making Stable/Uncomplicated    Designer, jewellery Low    Rehab Potential Good    PT Frequency 2x / week    PT Duration 8 weeks    PT Treatment/Interventions ADLs/Self Care Home Management;Biofeedback;Cryotherapy;Iontophoresis 4mg /ml Dexamethasone;Moist Heat;Ultrasound;Electrical Stimulation;DME Instruction;Gait training;Stair training;Functional mobility training;Therapeutic activities;Therapeutic exercise;Balance training;Neuromuscular re-education;Patient/family education;Manual techniques;Passive range of motion;Dry needling    PT Next Visit Plan review HEP, work on general L shoulder ROM and strength, core, balance. Consider ionto patch L shoulder?    PT Home Exercise Plan [id number]    Consulted and Agree with Plan of Care Patient             Patient will benefit from skilled therapeutic intervention in  order to improve the following deficits and impairments:  Decreased range of motion, Increased fascial restricitons, Decreased endurance, Increased muscle spasms, Impaired UE functional use, Decreased activity tolerance, Pain, Decreased balance, Impaired flexibility, Hypomobility, Improper body mechanics, Decreased mobility, Decreased strength, Postural dysfunction  Visit Diagnosis: Stiffness of left shoulder, not elsewhere classified  Muscle weakness (generalized)  Unsteadiness on feet  Abnormal posture     Problem List Patient Active Problem List   Diagnosis Date Noted   DDD (degenerative disc disease), thoracic 11/06/2020   Other idiopathic scoliosis, thoracolumbar region 11/06/2020   DDD (degenerative disc disease), lumbar 11/06/2020   Age-related osteoporosis without current pathological fracture 11/06/2020   Porokeratosis 11/26/2018   Corns and callosities 11/26/2018   History of  right shoulder replacement 10/07/2016   Chronic pain of both knees 05/20/2016   Osteoarthritis of both hands 05/20/2016   Essential hypertension 05/20/2016   Environmental allergies 05/20/2016   Primary osteoarthritis of both knees 05/19/2016   Callus of foot 11/24/2012   Pain in joint, ankle and foot 09/15/2012   Ann Lions PT, DPT, PN2   Supplemental Physical Therapist [place]. [place], [place], [address] Phone: [phone]   Fax:  [phone]  Name: Danielle Dean MRN: [id number] Date of Birth: 12/21/1928

## 2021-08-09 ENCOUNTER — Ambulatory Visit: Payer: Medicare Other | Admitting: Physical Therapy

## 2021-08-09 ENCOUNTER — Other Ambulatory Visit: Payer: Self-pay

## 2021-08-09 DIAGNOSIS — M25512 Pain in left shoulder: Secondary | ICD-10-CM

## 2021-08-09 DIAGNOSIS — M25612 Stiffness of left shoulder, not elsewhere classified: Secondary | ICD-10-CM | POA: Diagnosis not present

## 2021-08-09 DIAGNOSIS — M6281 Muscle weakness (generalized): Secondary | ICD-10-CM

## 2021-08-09 NOTE — Therapy (Signed)
[place]. [place], [place], [address] Phone: [phone]   Fax:  [phone]  Physical Therapy Treatment  Patient Details  Name: Danielle Dean MRN: [id number] Date of Birth: 05-09-1929 Referring Provider (PT): Deveshwar   Encounter Date: 08/09/2021   PT End of Session - 08/09/21 1001     Visit Number 2    Number of Visits 17    Date for PT Re-Evaluation 09/24/21    Authorization Type MCR and MCD    Authorization Time Period 07/30/21 to 09/24/21    PT Start Time 0930    PT Stop Time 1010    PT Time Calculation (min) 40 min             Past Medical History:  Diagnosis Date   Arthritis    Callus    Cataract fragments in both eyes following surgery    Corns and callosities    Hx of hysterectomy    Hypertension    Osteoarthritis     Past Surgical History:  Procedure Laterality Date   ABDOMINAL HYSTERECTOMY     NASAL SEPTUM SURGERY     ROTATOR CUFF REPAIR Right    TOTAL SHOULDER ARTHROPLASTY      There were no vitals filed for this visit.   Subjective Assessment - 08/09/21 0932     Subjective left shld really hurts when I try and use it    Currently in Pain? Yes    Pain Score 5     Pain Location Shoulder    Pain Orientation Left                               [facility] Adult PT Treatment/Exercise - 08/09/21 0001       Shoulder Exercises: Seated   Extension Strengthening;Both;20 reps;Theraband    Theraband Level (Shoulder Extension) Level 1 (Yellow)    Row Strengthening;Both;20 reps    Theraband Level (Shoulder Row) Level 1 (Yellow)    External Rotation Left;20 reps    Theraband Level (Shoulder External Rotation) Level 1 (Yellow)    Internal Rotation Strengthening;Left;Theraband;20 reps    Theraband Level (Shoulder Internal Rotation) Level 1 (Yellow)    Other Seated Exercises 1# cane flex,abd and chest press 10 x 2 sets      Shoulder Exercises: Standing   Other  Standing Exercises 1# cane ext and IR 2 sets 10      Shoulder Exercises: ROM/Strengthening   Nustep L 3 5 min      Modalities   Modalities Iontophoresis      Iontophoresis   Type of Iontophoresis Dexamethasone    Location left shld    Dose 1.1 cc dex    Time 57mA 4 hour leave on patch                       PT Short Term Goals - 07/30/21 1201       PT SHORT TERM GOAL #1   Title Will be independent with appropriate HEP, to be progressed PRN    Time 4    Period Weeks    Status New    Target Date 08/27/21      PT SHORT TERM GOAL #2   Title Will have no more than 3/10 in L shoulder in order to improve functional task tolerance    Time 4    Period Weeks    Status  New      PT SHORT TERM GOAL #3   Title Will have better understanding of posture and will be able to self correct at least 50% of the time on an independent basis    Time 4    Period Weeks    Status New      PT SHORT TERM GOAL #4   Title L shoulder ROM to be generally symmetrical that of the R in order to improve ease of functional task performance    Time 4    Period Weeks    Status New               PT Long Term Goals - 07/30/21 1203       PT LONG TERM GOAL #1   Title MMT to have improved by at least 1 grade in order to reduce pain and improve functional task performance    Time 8    Period Weeks    Status New    Target Date 09/24/21      PT LONG TERM GOAL #2   Title Will be able to maintain SLS for at least 5-10 seconds B and tandem stance at least 20 seconds B to show improved balance skills    Time 8    Period Weeks    Status New      PT LONG TERM GOAL #3   Title Will be able to lift a 5-8# object to shoulder heigh to allow her to perform functional tasks at home (such as taking a bottle of milk out of the fridge) without increased pain    Time 8    Period Weeks    Status New      PT LONG TERM GOAL #4   Title Will be able to perform functional tasks such as dressing, doing  hair, bathing without increase in L shoulder pain    Time 8    Period Weeks    Status New                   Plan - 08/09/21 1002     Clinical Impression Statement pt needed moderate verb and tactile cuing with ther ex , hard time relaxing and multiple compensations. pt fatigued easily but recovered quickly with rest. pt verb fearful of doin gtoo much and making sore. will see if ionto gives any relief    PT Treatment/Interventions ADLs/Self Care Home Management;Biofeedback;Cryotherapy;Iontophoresis 4mg /ml Dexamethasone;Moist Heat;Ultrasound;Electrical Stimulation;DME Instruction;Gait training;Stair training;Functional mobility training;Therapeutic activities;Therapeutic exercise;Balance training;Neuromuscular re-education;Patient/family education;Manual techniques;Passive range of motion;Dry needling    PT Next Visit Plan review HEP, work on general L shoulder ROM and strength, core, balance. See if  ionto patch helped L shoulder?             Patient will benefit from skilled therapeutic intervention in order to improve the following deficits and impairments:  Decreased range of motion, Increased fascial restricitons, Decreased endurance, Increased muscle spasms, Impaired UE functional use, Decreased activity tolerance, Pain, Decreased balance, Impaired flexibility, Hypomobility, Improper body mechanics, Decreased mobility, Decreased strength, Postural dysfunction  Visit Diagnosis: Muscle weakness (generalized)  Left shoulder pain, unspecified chronicity     Problem List Patient Active Problem List   Diagnosis Date Noted   DDD (degenerative disc disease), thoracic 11/06/2020   Other idiopathic scoliosis, thoracolumbar region 11/06/2020   DDD (degenerative disc disease), lumbar 11/06/2020   Age-related osteoporosis without current pathological fracture 11/06/2020   Porokeratosis 11/26/2018   Corns and callosities 11/26/2018   History  of right shoulder replacement  10/07/2016   Chronic pain of both knees 05/20/2016   Osteoarthritis of both hands 05/20/2016   Essential hypertension 05/20/2016   Environmental allergies 05/20/2016   Primary osteoarthritis of both knees 05/19/2016   Callus of foot 11/24/2012   Pain in joint, ankle and foot 09/15/2012    Saburo Luger,ANGIE, PTA 08/09/2021, 10:05 AM  [place]. [place], [place], [address] Phone: [phone]   Fax:  [phone]  Name: Danielle Dean MRN: [id number] Date of Birth: Apr 01, 1929

## 2021-08-13 ENCOUNTER — Ambulatory Visit: Payer: Medicare Other | Admitting: Physical Therapy

## 2021-08-13 ENCOUNTER — Encounter: Payer: Self-pay | Admitting: Physical Therapy

## 2021-08-13 ENCOUNTER — Other Ambulatory Visit: Payer: Self-pay

## 2021-08-13 DIAGNOSIS — M25512 Pain in left shoulder: Secondary | ICD-10-CM

## 2021-08-13 DIAGNOSIS — M25612 Stiffness of left shoulder, not elsewhere classified: Secondary | ICD-10-CM

## 2021-08-13 DIAGNOSIS — M6281 Muscle weakness (generalized): Secondary | ICD-10-CM

## 2021-08-13 NOTE — Therapy (Signed)
[place]. [place], [place], [address] Phone: [phone]   Fax:  [phone]  Physical Therapy Treatment  Patient Details  Name: Danielle Dean MRN: [id number] Date of Birth: 12-31-1928 Referring Provider (PT): Deveshwar   Encounter Date: 08/13/2021   PT End of Session - 08/13/21 1203     Visit Number 3    Number of Visits 17    Date for PT Re-Evaluation 09/24/21    Authorization Type MCR and MCD    Authorization Time Period 07/30/21 to 09/24/21    Progress Note Due on Visit 10    PT Start Time 1104    PT Stop Time 1146    PT Time Calculation (min) 42 min    Activity Tolerance Patient tolerated treatment well    Behavior During Therapy WFL for tasks assessed/performed             Past Medical History:  Diagnosis Date   Arthritis    Callus    Cataract fragments in both eyes following surgery    Corns and callosities    Hx of hysterectomy    Hypertension    Osteoarthritis     Past Surgical History:  Procedure Laterality Date   ABDOMINAL HYSTERECTOMY     NASAL SEPTUM SURGERY     ROTATOR CUFF REPAIR Right    TOTAL SHOULDER ARTHROPLASTY      There were no vitals filed for this visit.   Subjective Assessment - 08/13/21 1107     Subjective It depends on what I'm doing when it comes to how my pain is doing. The ionto patch was ok, not sure if it helped or now.    Patient Stated Goals feel in better general, avoid surgery    Currently in Pain? Other (Comment)   no pain when I'm sitting still                              [facility] Adult PT Treatment/Exercise - 08/13/21 0001       Shoulder Exercises: Seated   Extension Strengthening;Both;10 reps    Theraband Level (Shoulder Extension) Level 2 (Red)    Row Strengthening;Both;10 reps    Theraband Level (Shoulder Row) Level 2 (Red)    Horizontal ABduction Strengthening;Both;10 reps    Other Seated Exercises 1# cane flex,abd  and chest press 10 x 2 sets    Other Seated Exercises UBE backwards L1 x2 minutes      Iontophoresis   Type of Iontophoresis Dexamethasone    Location left shld    Dose 1.1 cc dex    Time 87mA 4 hour leave on patch                     PT Education - 08/13/21 1202     Education Details exercise form/purpose    Person(s) Educated Patient    Methods Explanation    Comprehension Verbalized understanding              PT Short Term Goals - 07/30/21 1201       PT SHORT TERM GOAL #1   Title Will be independent with appropriate HEP, to be progressed PRN    Time 4    Period Weeks    Status New    Target Date 08/27/21      PT SHORT TERM GOAL #2   Title Will have no more than 3/10 in  L shoulder in order to improve functional task tolerance    Time 4    Period Weeks    Status New      PT SHORT TERM GOAL #3   Title Will have better understanding of posture and will be able to self correct at least 50% of the time on an independent basis    Time 4    Period Weeks    Status New      PT SHORT TERM GOAL #4   Title L shoulder ROM to be generally symmetrical that of the R in order to improve ease of functional task performance    Time 4    Period Weeks    Status New               PT Long Term Goals - 07/30/21 1203       PT LONG TERM GOAL #1   Title MMT to have improved by at least 1 grade in order to reduce pain and improve functional task performance    Time 8    Period Weeks    Status New    Target Date 09/24/21      PT LONG TERM GOAL #2   Title Will be able to maintain SLS for at least 5-10 seconds B and tandem stance at least 20 seconds B to show improved balance skills    Time 8    Period Weeks    Status New      PT LONG TERM GOAL #3   Title Will be able to lift a 5-8# object to shoulder heigh to allow her to perform functional tasks at home (such as taking a bottle of milk out of the fridge) without increased pain    Time 8    Period Weeks     Status New      PT LONG TERM GOAL #4   Title Will be able to perform functional tasks such as dressing, doing hair, bathing without increase in L shoulder pain    Time 8    Period Weeks    Status New                   Plan - 08/13/21 Baldwin arrives today doing OK, still having shoulder pain. We continued with functional strengthening exercises as well as ionto patch this session, also introduced some newer strengthening exercises and tried UBE today. She reports she will be going to [place] to visit her daughter March 9th and will likely be staying for awhile, not sure when she will be coming back. Will continue to build HEP and continue with ionto patches up until this date.    Personal Factors and Comorbidities Age;Fitness;Comorbidity 1;Time since onset of injury/illness/exacerbation    Examination-Activity Limitations Bathing;Reach Overhead;Bed Mobility;Caring for Others;Carry;Dressing;Hygiene/Grooming;Lift    Examination-Participation Restrictions Church;Meal Prep;Cleaning;Community Activity;Shop;Laundry;Yard Work    Stability/Clinical Decision Making Stable/Uncomplicated    Designer, jewellery Low    Rehab Potential Good    PT Frequency 2x / week    PT Duration 8 weeks    PT Treatment/Interventions ADLs/Self Care Home Management;Biofeedback;Cryotherapy;Iontophoresis 4mg /ml Dexamethasone;Moist Heat;Ultrasound;Electrical Stimulation;DME Instruction;Gait training;Stair training;Functional mobility training;Therapeutic activities;Therapeutic exercise;Balance training;Neuromuscular re-education;Patient/family education;Manual techniques;Passive range of motion;Dry needling    PT Next Visit Plan continue strengthening and ionto. Need to build advanced HEP as she wil be going to [place] on the 9th and will be gone for awhile so this will basically be DC  PT Home Exercise Plan [id number]    Consulted and Agree with Plan of Care Patient              Patient will benefit from skilled therapeutic intervention in order to improve the following deficits and impairments:  Decreased range of motion, Increased fascial restricitons, Decreased endurance, Increased muscle spasms, Impaired UE functional use, Decreased activity tolerance, Pain, Decreased balance, Impaired flexibility, Hypomobility, Improper body mechanics, Decreased mobility, Decreased strength, Postural dysfunction  Visit Diagnosis: Muscle weakness (generalized)  Left shoulder pain, unspecified chronicity  Stiffness of left shoulder, not elsewhere classified     Problem List Patient Active Problem List   Diagnosis Date Noted   DDD (degenerative disc disease), thoracic 11/06/2020   Other idiopathic scoliosis, thoracolumbar region 11/06/2020   DDD (degenerative disc disease), lumbar 11/06/2020   Age-related osteoporosis without current pathological fracture 11/06/2020   Porokeratosis 11/26/2018   Corns and callosities 11/26/2018   History of right shoulder replacement 10/07/2016   Chronic pain of both knees 05/20/2016   Osteoarthritis of both hands 05/20/2016   Essential hypertension 05/20/2016   Environmental allergies 05/20/2016   Primary osteoarthritis of both knees 05/19/2016   Callus of foot 11/24/2012   Pain in joint, ankle and foot 09/15/2012   Ann Lions PT, DPT, PN2   Supplemental Physical Therapist [place]. [place], [place], [address] Phone: [phone]   Fax:  [phone]  Name: RAELEA SEGNER MRN: [id number] Date of Birth: [birth year]

## 2021-08-15 ENCOUNTER — Other Ambulatory Visit: Payer: Self-pay

## 2021-08-15 ENCOUNTER — Ambulatory Visit: Payer: Medicare Other | Attending: Rheumatology | Admitting: Physical Therapy

## 2021-08-15 ENCOUNTER — Encounter: Payer: Self-pay | Admitting: Physical Therapy

## 2021-08-15 DIAGNOSIS — M6281 Muscle weakness (generalized): Secondary | ICD-10-CM | POA: Diagnosis present

## 2021-08-15 DIAGNOSIS — M25612 Stiffness of left shoulder, not elsewhere classified: Secondary | ICD-10-CM | POA: Diagnosis present

## 2021-08-15 DIAGNOSIS — R2681 Unsteadiness on feet: Secondary | ICD-10-CM | POA: Diagnosis present

## 2021-08-15 DIAGNOSIS — R293 Abnormal posture: Secondary | ICD-10-CM | POA: Insufficient documentation

## 2021-08-15 DIAGNOSIS — M25512 Pain in left shoulder: Secondary | ICD-10-CM | POA: Diagnosis present

## 2021-08-15 NOTE — Therapy (Signed)
[place], [place], [address] Phone: [phone]   Fax:  [phone]  Physical Therapy Treatment  Patient Details  Name: Danielle Dean MRN: [id number] Date of Birth: 12/29/1928 Referring Provider (PT): Deveshwar   Encounter Date: 08/15/2021   PT End of Session - 08/15/21 1202     Visit Number 4    Number of Visits 6    Date for PT Re-Evaluation 08/22/21    Authorization Type MCR and MCD    Authorization Time Period 07/30/21 to 09/24/21    Progress Note Due on Visit 10    PT Start Time 1103    PT Stop Time 1143    PT Time Calculation (min) 40 min    Activity Tolerance Patient tolerated treatment well    Behavior During Therapy WFL for tasks assessed/performed             Past Medical History:  Diagnosis Date   Arthritis    Callus    Cataract fragments in both eyes following surgery    Corns and callosities    Hx of hysterectomy    Hypertension    Osteoarthritis     Past Surgical History:  Procedure Laterality Date   ABDOMINAL HYSTERECTOMY     NASAL SEPTUM SURGERY     ROTATOR CUFF REPAIR Right    TOTAL SHOULDER ARTHROPLASTY      There were no vitals filed for this visit.   Subjective Assessment - 08/15/21 1106     Subjective I was sore after last session; the patch was OK last time, not sure if it helps or not.    Pertinent History osteoporosis    Patient Stated Goals feel in better general, avoid surgery    Currently in Pain? No/denies   no pain when sitting still very vague about amount of pain and symptoms when moving arm                              [facility] Adult PT Treatment/Exercise - 08/15/21 0001       Shoulder Exercises: Supine   Protraction Both;10 reps    Protraction Weight (lbs) 1    Flexion Left;10 reps;Weights;Right    Shoulder Flexion Weight (lbs) 1    Flexion Limitations heavy multimodal cues for form    Other Supine Exercises supine  punches 1x10 1# weight; bicep curls 1x10 1# weight; sword draws 1x10 supine      Shoulder Exercises: Seated   Other Seated Exercises backwards shoulder rolls x30      Shoulder Exercises: Stretch   Other Shoulder Stretches UT stretch 2x30 seconds B      Iontophoresis   Type of Iontophoresis Dexamethasone    Location left shld    Dose 1.1 cc dex    Time 41mA 4 hour leave on patch                     PT Education - 08/15/21 1202     Education Details ionto patch precautions and removal time, heavy multimodal cues and facilitation for exercise form    Person(s) Educated Patient    Methods Explanation    Comprehension Verbalized understanding;Need further instruction              PT Short Term Goals - 07/30/21 1201       PT SHORT TERM GOAL #1   Title Will be independent with  appropriate HEP, to be progressed PRN    Time 4    Period Weeks    Status New    Target Date 08/27/21      PT SHORT TERM GOAL #2   Title Will have no more than 3/10 in L shoulder in order to improve functional task tolerance    Time 4    Period Weeks    Status New      PT SHORT TERM GOAL #3   Title Will have better understanding of posture and will be able to self correct at least 50% of the time on an independent basis    Time 4    Period Weeks    Status New      PT SHORT TERM GOAL #4   Title L shoulder ROM to be generally symmetrical that of the R in order to improve ease of functional task performance    Time 4    Period Weeks    Status New               PT Long Term Goals - 07/30/21 1203       PT LONG TERM GOAL #1   Title MMT to have improved by at least 1 grade in order to reduce pain and improve functional task performance    Time 8    Period Weeks    Status New    Target Date 09/24/21      PT LONG TERM GOAL #2   Title Will be able to maintain SLS for at least 5-10 seconds B and tandem stance at least 20 seconds B to show improved balance skills    Time 8     Period Weeks    Status New      PT LONG TERM GOAL #3   Title Will be able to lift a 5-8# object to shoulder heigh to allow her to perform functional tasks at home (such as taking a bottle of milk out of the fridge) without increased pain    Time 8    Period Weeks    Status New      PT LONG TERM GOAL #4   Title Will be able to perform functional tasks such as dressing, doing hair, bathing without increase in L shoulder pain    Time 8    Period Weeks    Status New                   Plan - 08/15/21 1203     Clinical Impression Statement Danielle Dean arrives today doing OK, has lots of questions for me about lidocaine patches and had a lot to tell me about her moving to [place]. Really perseverated on pain today and needed full attention and multimodal cues for exercises, very internally distracted and needed repeated cues for good form, difficult to redirect today. We tried some supine exercises to see if this helped with her pain, also continued with ionto patch. Very vague describing pain today which makes it hard to decipher what is happening with pain with movement (IE DOMS vs mm weakness, vs acute pain). She is moving to [place] on March ninth and will not be coming back after this date, I really encouraged her to f/u with therapy in [place] after her move, we will also plan on sending family an HEP to try to keep up with.    Personal Factors and Comorbidities Age;Fitness;Comorbidity 1;Time since onset of injury/illness/exacerbation    Examination-Activity Limitations Bathing;Reach Overhead;Bed  Mobility;Caring for Others;Carry;Dressing;Hygiene/Grooming;Lift    Examination-Participation Restrictions Church;Meal Prep;Cleaning;Community Activity;Shop;Laundry;Yard Work    Stability/Clinical Decision Making Stable/Uncomplicated    Designer, jewellery Low    Rehab Potential Good    PT Frequency 2x / week    PT Duration 8 weeks    PT Treatment/Interventions ADLs/Self Care Home  Management;Biofeedback;Cryotherapy;Iontophoresis 4mg /ml Dexamethasone;Moist Heat;Ultrasound;Electrical Stimulation;DME Instruction;Gait training;Stair training;Functional mobility training;Therapeutic activities;Therapeutic exercise;Balance training;Neuromuscular re-education;Patient/family education;Manual techniques;Passive range of motion;Dry needling    PT Next Visit Plan continue strengthening and ionto. Need to build advanced HEP as she wil be going to [place] on the 9th and will be gone for awhile so this will basically be DC    PT Home Exercise Plan [id number]    Consulted and Agree with Plan of Care Patient             Patient will benefit from skilled therapeutic intervention in order to improve the following deficits and impairments:  Decreased range of motion, Increased fascial restricitons, Decreased endurance, Increased muscle spasms, Impaired UE functional use, Decreased activity tolerance, Pain, Decreased balance, Impaired flexibility, Hypomobility, Improper body mechanics, Decreased mobility, Decreased strength, Postural dysfunction  Visit Diagnosis: Muscle weakness (generalized)  Left shoulder pain, unspecified chronicity  Stiffness of left shoulder, not elsewhere classified     Problem List Patient Active Problem List   Diagnosis Date Noted   DDD (degenerative disc disease), thoracic 11/06/2020   Other idiopathic scoliosis, thoracolumbar region 11/06/2020   DDD (degenerative disc disease), lumbar 11/06/2020   Age-related osteoporosis without current pathological fracture 11/06/2020   Porokeratosis 11/26/2018   Corns and callosities 11/26/2018   History of right shoulder replacement 10/07/2016   Chronic pain of both knees 05/20/2016   Osteoarthritis of both hands 05/20/2016   Essential hypertension 05/20/2016   Environmental allergies 05/20/2016   Primary osteoarthritis of both knees 05/19/2016   Callus of foot 11/24/2012   Pain in joint, ankle and foot 09/15/2012    Ann Lions PT, DPT, PN2   Supplemental Physical Therapist [place] [place], [place], [address] Phone: [phone]   Fax:  [phone]  Name: Danielle Dean MRN: [id number] Date of Birth: 17-Feb-1929

## 2021-08-20 ENCOUNTER — Encounter: Payer: Self-pay | Admitting: Physical Therapy

## 2021-08-20 ENCOUNTER — Ambulatory Visit: Payer: Medicare Other | Admitting: Physical Therapy

## 2021-08-20 ENCOUNTER — Other Ambulatory Visit: Payer: Self-pay

## 2021-08-20 DIAGNOSIS — M6281 Muscle weakness (generalized): Secondary | ICD-10-CM | POA: Diagnosis not present

## 2021-08-20 DIAGNOSIS — M25512 Pain in left shoulder: Secondary | ICD-10-CM

## 2021-08-20 DIAGNOSIS — R293 Abnormal posture: Secondary | ICD-10-CM

## 2021-08-20 DIAGNOSIS — R2681 Unsteadiness on feet: Secondary | ICD-10-CM

## 2021-08-20 DIAGNOSIS — M25612 Stiffness of left shoulder, not elsewhere classified: Secondary | ICD-10-CM

## 2021-08-20 NOTE — Therapy (Signed)
[facility] ?[place] ?[address] ?[place], [place], [address] ?Phone: [phone]   Fax:  [phone] ? ?Physical Therapy Treatment ? ?Patient Details  ?Name: Danielle Dean ?MRN: [id number] ?Date of Birth: 1929-04-21 ?Referring Provider (PT): Deveshwar ? ? ?Encounter Date: 08/20/2021 ? ? PT End of Session - 08/20/21 1153   ? ? Visit Number 5   ? Number of Visits 6   ? Date for PT Re-Evaluation 08/22/21   ? Authorization Type MCR and MCD   ? Authorization Time Period 07/30/21 to 09/24/21   ? Progress Note Due on Visit 10   ? PT Start Time 1103   ? PT Stop Time 1144   ? PT Time Calculation (min) 41 min   ? Activity Tolerance Patient tolerated treatment well   ? Behavior During Therapy [facility] for tasks assessed/performed   ? ?  ?  ? ?  ? ? ?Past Medical History:  ?Diagnosis Date  ? Arthritis   ? Callus   ? Cataract fragments in both eyes following surgery   ? Corns and callosities   ? Hx of hysterectomy   ? Hypertension   ? Osteoarthritis   ? ? ?Past Surgical History:  ?Procedure Laterality Date  ? ABDOMINAL HYSTERECTOMY    ? NASAL SEPTUM SURGERY    ? ROTATOR CUFF REPAIR Right   ? TOTAL SHOULDER ARTHROPLASTY    ? ? ?There were no vitals filed for this visit. ? ? Subjective Assessment - 08/20/21 1105   ? ? Subjective A little sore after last session but as bad as it was, maybe its a little better   ? Pertinent History osteoporosis   ? Patient Stated Goals feel in better general, avoid surgery   ? Currently in Pain? No/denies   ? ?  ?  ? ?  ? ? ? ? ? ? ? ? ? ? ? ? ? ? ? ? ? ? ? ? [facility] Adult PT Treatment/Exercise - 08/20/21 0001   ? ?  ? Shoulder Exercises: Supine  ? Flexion Both;15 reps   10+5 reps B 1# weight  ? Shoulder Flexion Weight (lbs) 1   ? ABduction Left;10 reps   2 sets  ? ABduction Limitations 0#   ? Other Supine Exercises supine punches 1x15 1# weight   ?  ? Shoulder Exercises: Seated  ? Other Seated Exercises backward shoulder rolls x20; scaplar retractions 1x15 2  second holds   ? Other Seated Exercises thoracic 3D excursions 1x10 all directions; UBE L 1.5 x3 min forward/3 backward   ?  ? Iontophoresis  ? Type of Iontophoresis Dexamethasone   ? Location left shld   ? Dose 1.1 cc dex   ? Time 49mA 4 hour leave on patch   ? ?  ?  ? ?  ? ? ? ? ? ? ? ? ? ? PT Education - 08/20/21 1153   ? ? Education Details POC for next session, timing for ionto patch removal; exercise form and purpose   ? Person(s) Educated Patient   ? Methods Explanation   ? Comprehension Verbalized understanding   ? ?  ?  ? ?  ? ? ? PT Short Term Goals - 07/30/21 1201   ? ?  ? PT SHORT TERM GOAL #1  ? Title Will be independent with appropriate HEP, to be progressed PRN   ? Time 4   ? Period Weeks   ? Status New   ? Target Date  08/27/21   ?  ? PT SHORT TERM GOAL #2  ? Title Will have no more than 3/10 in L shoulder in order to improve functional task tolerance   ? Time 4   ? Period Weeks   ? Status New   ?  ? PT SHORT TERM GOAL #3  ? Title Will have better understanding of posture and will be able to self correct at least 50% of the time on an independent basis   ? Time 4   ? Period Weeks   ? Status New   ?  ? PT SHORT TERM GOAL #4  ? Title L shoulder ROM to be generally symmetrical that of the R in order to improve ease of functional task performance   ? Time 4   ? Period Weeks   ? Status New   ? ?  ?  ? ?  ? ? ? ? PT Long Term Goals - 07/30/21 1203   ? ?  ? PT LONG TERM GOAL #1  ? Title MMT to have improved by at least 1 grade in order to reduce pain and improve functional task performance   ? Time 8   ? Period Weeks   ? Status New   ? Target Date 09/24/21   ?  ? PT LONG TERM GOAL #2  ? Title Will be able to maintain SLS for at least 5-10 seconds B and tandem stance at least 20 seconds B to show improved balance skills   ? Time 8   ? Period Weeks   ? Status New   ?  ? PT LONG TERM GOAL #3  ? Title Will be able to lift a 5-8# object to shoulder heigh to allow her to perform functional tasks at home (such as  taking a bottle of milk out of the fridge) without increased pain   ? Time 8   ? Period Weeks   ? Status New   ?  ? PT LONG TERM GOAL #4  ? Title Will be able to perform functional tasks such as dressing, doing hair, bathing without increase in L shoulder pain   ? Time 8   ? Period Weeks   ? Status New   ? ?  ?  ? ?  ? ? ? ? ? ? ? ? Plan - 08/20/21 1154   ? ? Clinical Impression Statement Kerryann arrives doing OK, having a bit less pain in her shoulder. We continued with supine exercises and postural training today as she seemed to tolerate this well last time without too much pain afterwards. Did much better with form today. We also continued with ionto patch as this finally seems to be helping. She will be moving to [place] soon- will need DC assessment and HEP for her to use in [place] next session.   ? Personal Factors and Comorbidities Age;Fitness;Comorbidity 1;Time since onset of injury/illness/exacerbation   ? Examination-Activity Limitations Bathing;Reach Overhead;Bed Mobility;Caring for Others;Carry;Dressing;Hygiene/Grooming;Lift   ? Examination-Participation Restrictions Church;Meal Prep;Cleaning;Community Activity;Shop;Laundry;[employer] Work   ? Stability/Clinical Decision Making Stable/Uncomplicated   ? Clinical Decision Making Low   ? Rehab Potential Good   ? PT Frequency 2x / week   ? PT Duration 8 weeks   ? PT Treatment/Interventions ADLs/Self Care Home Management;Biofeedback;Cryotherapy;Iontophoresis 4mg /ml Dexamethasone;Moist Heat;Ultrasound;Electrical Stimulation;DME Instruction;Gait training;Stair training;Functional mobility training;Therapeutic activities;Therapeutic exercise;Balance training;Neuromuscular re-education;Patient/family education;Manual techniques;Passive range of motion;Dry needling   ? PT Next Visit Plan DC note and HEP   ? PT Home Exercise Plan [id number]   ?  Consulted and Agree with Plan of Care Patient   ? ?  ?  ? ?  ? ? ?Patient will benefit from skilled therapeutic intervention in order to  improve the following deficits and impairments:  Decreased range of motion, Increased fascial restricitons, Decreased endurance, Increased muscle spasms, Impaired UE functional use, Decreased activity tolerance, Pain, Decreased balance, Impaired flexibility, Hypomobility, Improper body mechanics, Decreased mobility, Decreased strength, Postural dysfunction ? ?Visit Diagnosis: ?Muscle weakness (generalized) ? ?Left shoulder pain, unspecified chronicity ? ?Stiffness of left shoulder, not elsewhere classified ? ?Unsteadiness on feet ? ?Abnormal posture ? ? ? ? ?Problem List ?Patient Active Problem List  ? Diagnosis Date Noted  ? DDD (degenerative disc disease), thoracic 11/06/2020  ? Other idiopathic scoliosis, thoracolumbar region 11/06/2020  ? DDD (degenerative disc disease), lumbar 11/06/2020  ? Age-related osteoporosis without current pathological fracture 11/06/2020  ? Porokeratosis 11/26/2018  ? Corns and callosities 11/26/2018  ? History of right shoulder replacement 10/07/2016  ? Chronic pain of both knees 05/20/2016  ? Osteoarthritis of both hands 05/20/2016  ? Essential hypertension 05/20/2016  ? Environmental allergies 05/20/2016  ? Primary osteoarthritis of both knees 05/19/2016  ? Callus of foot 11/24/2012  ? Pain in joint, ankle and foot 09/15/2012  ? ?Rajohn Henery U PT, DPT, PN2  ? ?Supplemental Physical Therapist ?[place]  ? ? ? ? ? ?[place] ?[place] ?[address] ?[place], [place], [address] ?Phone: [phone]   Fax:  [phone] ? ?Name: BREXLYN OTTEY ?MRN: [id number] ?Date of Birth: 1929-03-29 ? ? ? ?

## 2021-08-21 ENCOUNTER — Other Ambulatory Visit: Payer: Self-pay | Admitting: Rheumatology

## 2021-08-21 MED ORDER — DULOXETINE HCL 20 MG PO CPEP: 20.0000 mg | ORAL_CAPSULE | Freq: Every day | ORAL | 0 refills | Status: DC

## 2021-08-21 NOTE — Telephone Encounter (Signed)
Renard Hamper, patient's daughter, called the office stating her mother is going to [place] on Saturday and will be there for maybe 3 months. Renard Hamper states her mother's Cymbalta was only filled for 1 month. Renard Hamper states she would like to see if they could have another prescription sent in for a 3 month supply so she has it while she is in [place]. ?

## 2021-08-21 NOTE — Telephone Encounter (Signed)
Next Visit: 01/16/2022 ? ?Last Visit: 07/10/2021 ? ?Current Dose per office note on 07/10/2021: not discussed ? ?Okay to refill Cymbalta?   ?

## 2021-08-22 ENCOUNTER — Other Ambulatory Visit: Payer: Self-pay

## 2021-08-22 ENCOUNTER — Encounter: Payer: Self-pay | Admitting: Physical Therapy

## 2021-08-22 ENCOUNTER — Ambulatory Visit: Payer: Medicare Other | Admitting: Physical Therapy

## 2021-08-22 DIAGNOSIS — R2681 Unsteadiness on feet: Secondary | ICD-10-CM

## 2021-08-22 DIAGNOSIS — M6281 Muscle weakness (generalized): Secondary | ICD-10-CM | POA: Diagnosis not present

## 2021-08-22 DIAGNOSIS — M25612 Stiffness of left shoulder, not elsewhere classified: Secondary | ICD-10-CM

## 2021-08-22 DIAGNOSIS — M25512 Pain in left shoulder: Secondary | ICD-10-CM

## 2021-08-22 NOTE — Therapy (Signed)
[facility] [place]. [place], [place], [address] Phone: [phone]   Fax:  [phone]  Physical Therapy Treatment  Patient Details  Name: Danielle Dean MRN: [id number] Date of Birth: 1928-11-29 Referring Provider (PT): Deveshwar   Encounter Date: 08/22/2021  PHYSICAL THERAPY DISCHARGE SUMMARY  Visits from Start of Care: 6  Current functional level related to goals / functional outcomes: DC per patient request- sounds like she is moving to [place] (potentially permanently) where she has much better support from family. I advised she will benefit from f/u PT in [place]- see note below for details.    Remaining deficits: See below    Education / Equipment: See below    Patient agrees to discharge. Patient goals were not met. Patient is being discharged due to the patient's request.    PT End of Session - 08/22/21 1159     Visit Number 6    Number of Visits 6    Date for PT Re-Evaluation 08/22/21    Authorization Type MCR and MCD    Authorization Time Period 07/30/21 to 09/24/21    Progress Note Due on Visit 10    PT Start Time 1103    PT Stop Time 1143    PT Time Calculation (min) 40 min    Activity Tolerance Patient tolerated treatment well    Behavior During Therapy WFL for tasks assessed/performed             Past Medical History:  Diagnosis Date   Arthritis    Callus    Cataract fragments in both eyes following surgery    Corns and callosities    Hx of hysterectomy    Hypertension    Osteoarthritis     Past Surgical History:  Procedure Laterality Date   ABDOMINAL HYSTERECTOMY     NASAL SEPTUM SURGERY     ROTATOR CUFF REPAIR Right    TOTAL SHOULDER ARTHROPLASTY      There were no vitals filed for this visit.   Subjective Assessment - 08/22/21 1113     Subjective I'm feeling OK today, I felt scratching under the patch but I didn't see anything on my skin when I took it off    Pertinent  History osteoporosis    Patient Stated Goals feel in better general, avoid surgery    Currently in Pain? No/denies   only has pain when moving shoulder, unclear how much               [facility] PT Assessment - 08/22/21 0001       Observation/Other Assessments   Focus on Therapeutic Outcomes (FOTO)  60      AROM   Left Shoulder Flexion 125 Degrees    Left Shoulder ABduction 120 Degrees    Left Shoulder Internal Rotation --   FIR L5/S1   Left Shoulder External Rotation --   FER base of neck/suboccipital region     Strength   Right Shoulder Flexion 3+/5    Right Shoulder ABduction 3+/5    Right Shoulder Internal Rotation 3+/5    Right Shoulder External Rotation 3/5    Left Shoulder Flexion 3/5    Left Shoulder ABduction 3/5    Left Shoulder Internal Rotation 3+/5    Left Shoulder External Rotation 3+/5    Right Elbow Flexion 4+/5    Right Elbow Extension 3/5    Left Elbow Flexion 4+/5    Left Elbow Extension 3/5  Palpation   Palpation comment less trigger points and tightness noted                           [facility] Adult PT Treatment/Exercise - 08/22/21 0001       Lumbar Exercises: Aerobic   Nustep L2 BUEs/BLEs x6 minutes      Iontophoresis   Type of Iontophoresis Dexamethasone    Location left shld    Dose 1.1 cc dex    Time 3m 4 hour leave on patch                     PT Education - 08/22/21 1157     Education Details progress with PT, goal review, recommendations for f/u PT in [place] to make sure she has good form and does not further irritate shoulder (still needs a lot of help with good form for basic exercises); OK to take this patch off if she has any more itching/scratching under patch/patch removal time    Person(s) Educated Patient    Methods Explanation    Comprehension Verbalized understanding              PT Short Term Goals - 08/22/21 1125       PT SHORT TERM GOAL #1   Title Will be independent with appropriate  HEP, to be progressed PRN    Baseline 3/9- not compliant, only did once or twice    Time 4    Period Weeks    Status Not Met      PT SHORT TERM GOAL #2   Title Will have no more than 3/10 in L shoulder in order to improve functional task tolerance    Baseline 3/9- just a little pain when trying to use it    Time 4    Period Weeks    Status Partially Met      PT SHORT TERM GOAL #3   Title Will have better understanding of posture and will be able to self correct at least 50% of the time on an independent basis    Baseline 3/9- still with ongoing postural impairment    Time 4    Period Weeks    Status On-going      PT SHORT TERM GOAL #4   Title L shoulder ROM to be generally symmetrical that of the R in order to improve ease of functional task performance    Baseline 3/9- equal but R also quite limited    Time 4    Period Weeks    Status Achieved               PT Long Term Goals - 08/22/21 1127       PT LONG TERM GOAL #1   Title MMT to have improved by at least 1 grade in order to reduce pain and improve functional task performance    Baseline 3/9- improving but not at goal level    Time 8    Period Weeks    Status Not Met      PT LONG TERM GOAL #2   Title Will be able to maintain SLS for at least 5-10 seconds B and tandem stance at least 20 seconds B to show improved balance skills    Baseline 3/9- didn't have a chance to work on this during therapy    Time 8    Period Weeks    Status Not Met  PT LONG TERM GOAL #3   Title Will be able to lift a 5-8# object to shoulder heigh to allow her to perform functional tasks at home (such as taking a bottle of milk out of the fridge) without increased pain    Baseline 3/9- still difficult to do, actually only uses half gallons    Time 8    Period Weeks    Status Not Met      PT LONG TERM GOAL #4   Title Will be able to perform functional tasks such as dressing, doing hair, bathing without increase in L shoulder pain     Baseline 3/9- improving    Time 8    Period Weeks    Status Not Met                   Plan - 08/22/21 1200     Clinical Impression [place] arrives today doing OK, requested to warm up on Nustep which she was able to tolerate well. Did re-assessment as this her last day, she does show some progress and would likely benefit from continuation of PT in [place]- we discussed that this would be the best treatment option especially to make sure she is exercising safely and having good form, still needs a lot of help for good technique with exercises to avoid impinging or irritating shoulder. She tells me she felt some scratching under the ionto patch last time, skin inspection was clear without any signs of skin breakdown or irritation and I educated her to take the patch off early if this happens again. DC today per pt request, thank you for the referral.    Personal Factors and Comorbidities Age;Fitness;Comorbidity 1;Time since onset of injury/illness/exacerbation    Examination-Activity Limitations Bathing;Reach Overhead;Bed Mobility;Caring for Others;Carry;Dressing;Hygiene/Grooming;Lift    Examination-Participation Restrictions Church;Meal Prep;Cleaning;Community Activity;Shop;Laundry;Yard Work    Stability/Clinical Decision Making Stable/Uncomplicated    Designer, jewellery Low    Rehab Potential Good    PT Frequency Other (comment)   DC today   PT Duration Other (comment)   DC today   PT Treatment/Interventions ADLs/Self Care Home Management;Biofeedback;Cryotherapy;Iontophoresis 51m/ml Dexamethasone;Moist Heat;Ultrasound;Electrical Stimulation;DME Instruction;Gait training;Stair training;Functional mobility training;Therapeutic activities;Therapeutic exercise;Balance training;Neuromuscular re-education;Patient/family education;Manual techniques;Passive range of motion;Dry needling    PT Next Visit Plan DC today    PT Home Exercise Plan [id number]   Consulted and Agree  with Plan of Care Patient             Patient will benefit from skilled therapeutic intervention in order to improve the following deficits and impairments:  Decreased range of motion, Increased fascial restricitons, Decreased endurance, Increased muscle spasms, Impaired UE functional use, Decreased activity tolerance, Pain, Decreased balance, Impaired flexibility, Hypomobility, Improper body mechanics, Decreased mobility, Decreased strength, Postural dysfunction  Visit Diagnosis: Muscle weakness (generalized)  Left shoulder pain, unspecified chronicity  Stiffness of left shoulder, not elsewhere classified  Unsteadiness on feet     Problem List Patient Active Problem List   Diagnosis Date Noted   DDD (degenerative disc disease), thoracic 11/06/2020   Other idiopathic scoliosis, thoracolumbar region 11/06/2020   DDD (degenerative disc disease), lumbar 11/06/2020   Age-related osteoporosis without current pathological fracture 11/06/2020   Porokeratosis 11/26/2018   Corns and callosities 11/26/2018   History of right shoulder replacement 10/07/2016   Chronic pain of both knees 05/20/2016   Osteoarthritis of both hands 05/20/2016   Essential hypertension 05/20/2016   Environmental allergies 05/20/2016   Primary osteoarthritis of both knees 05/19/2016  Callus of foot 11/24/2012   Pain in joint, ankle and foot 09/15/2012   Ann Lions PT, DPT, PN2   [facility] [place]. [place], [place], [address] Phone: [phone]   Fax:  [phone]  Name: Danielle Dean MRN: [id number] Date of Birth: [birth year]

## 2021-10-28 IMAGING — CR DG LUMBAR SPINE COMPLETE 4+V
5 series · 5 of 5 positions shown · non-contrast
Comparison: None.

CLINICAL DATA: Back pain.

EXAM:
LUMBAR SPINE - COMPLETE 4+ VIEW

[t l-spine a.p.]
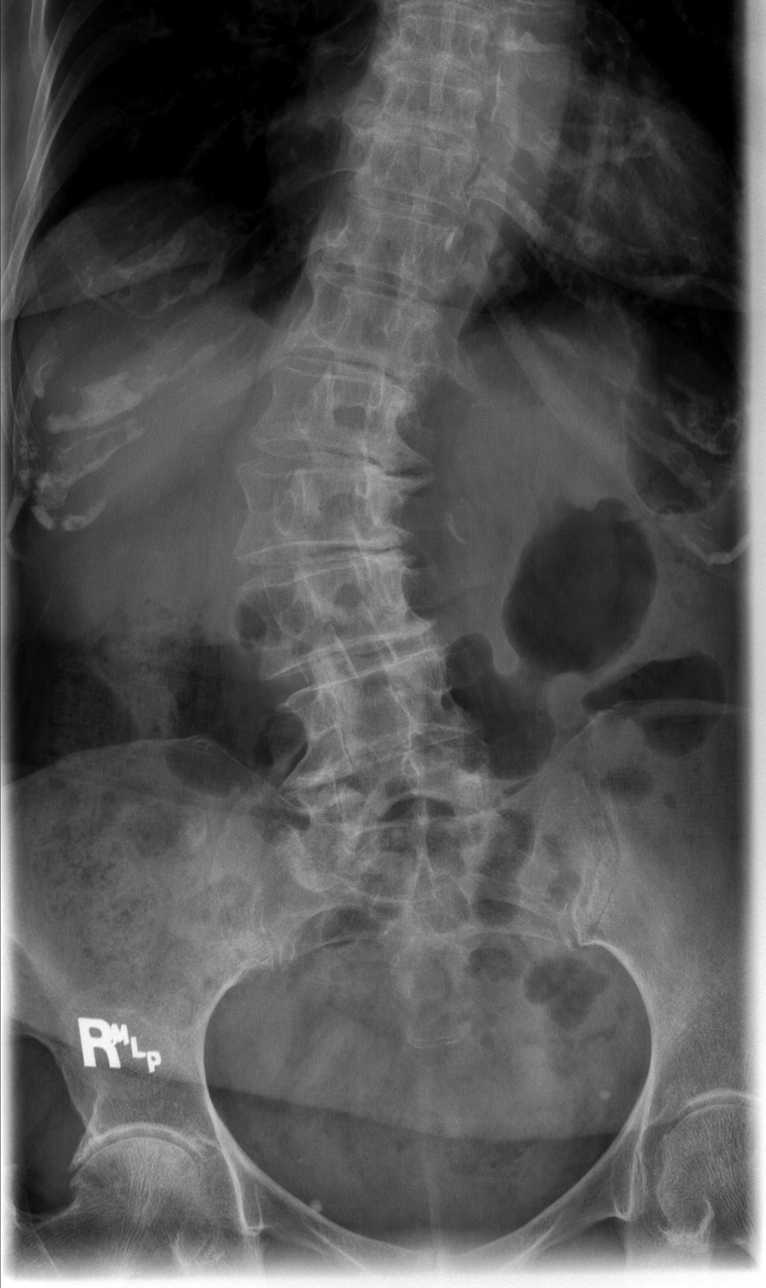

[t l-spine oblique exposure (1 of 2)]
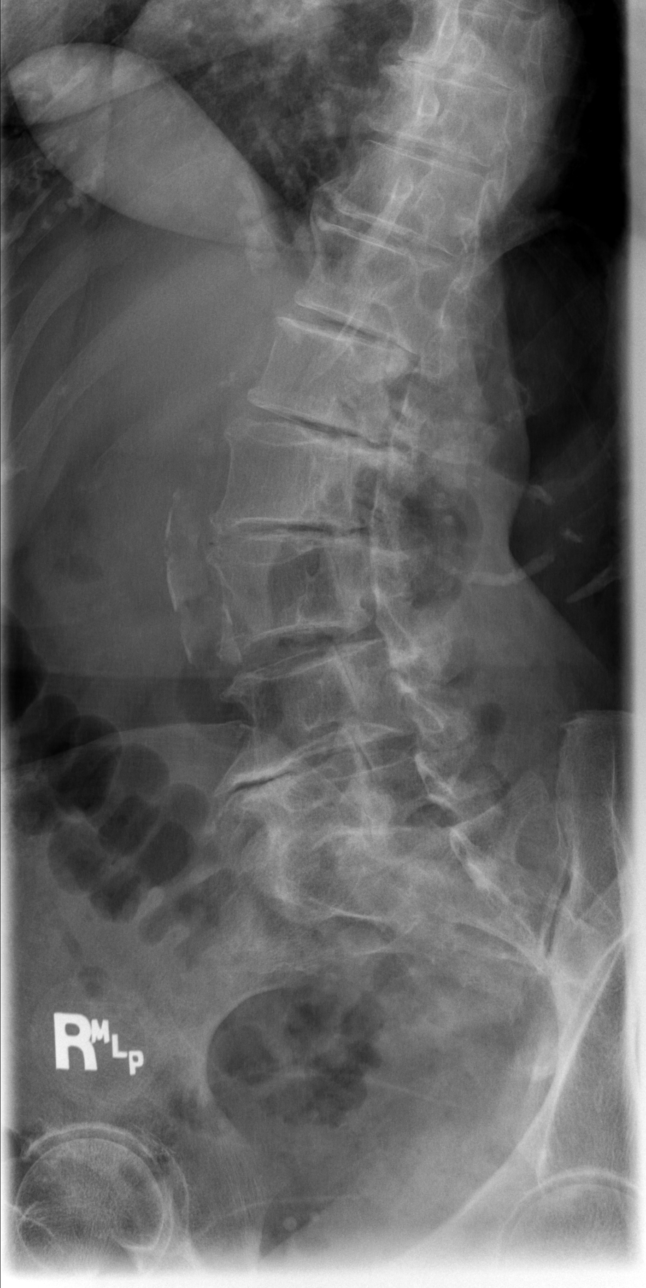

[t l-spine oblique exposure (2 of 2)]
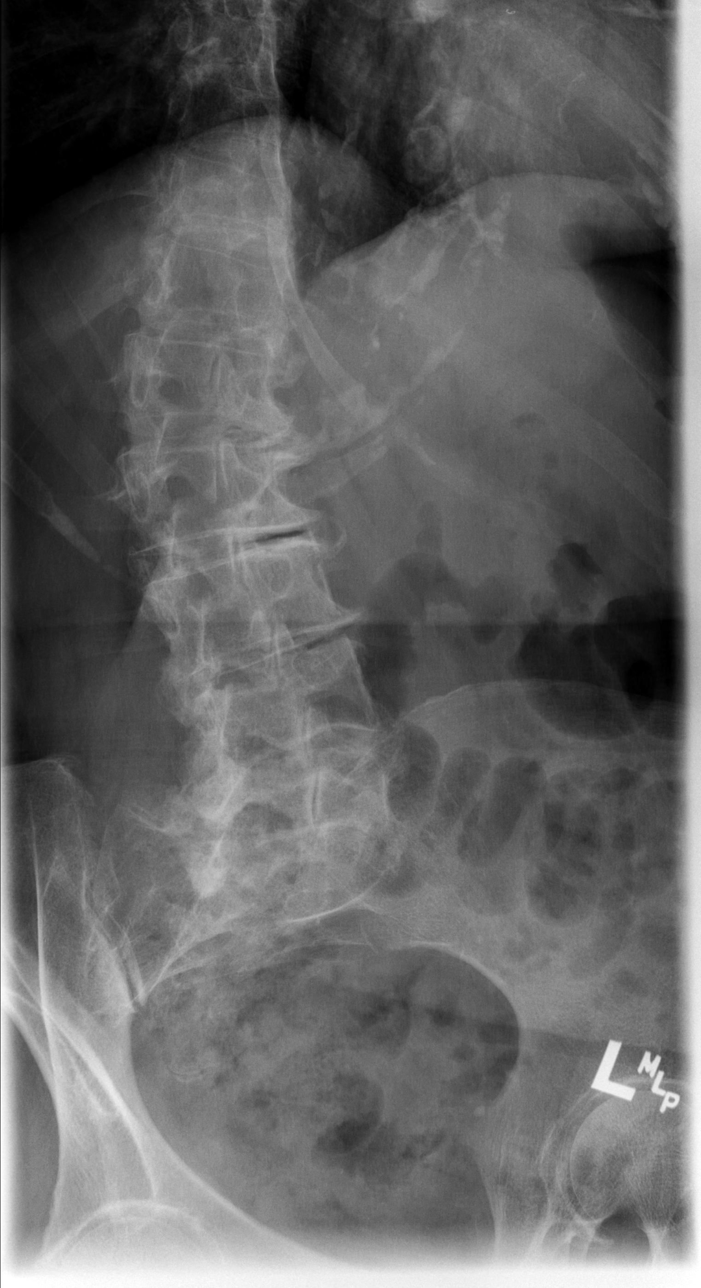

[t l-spine lat]
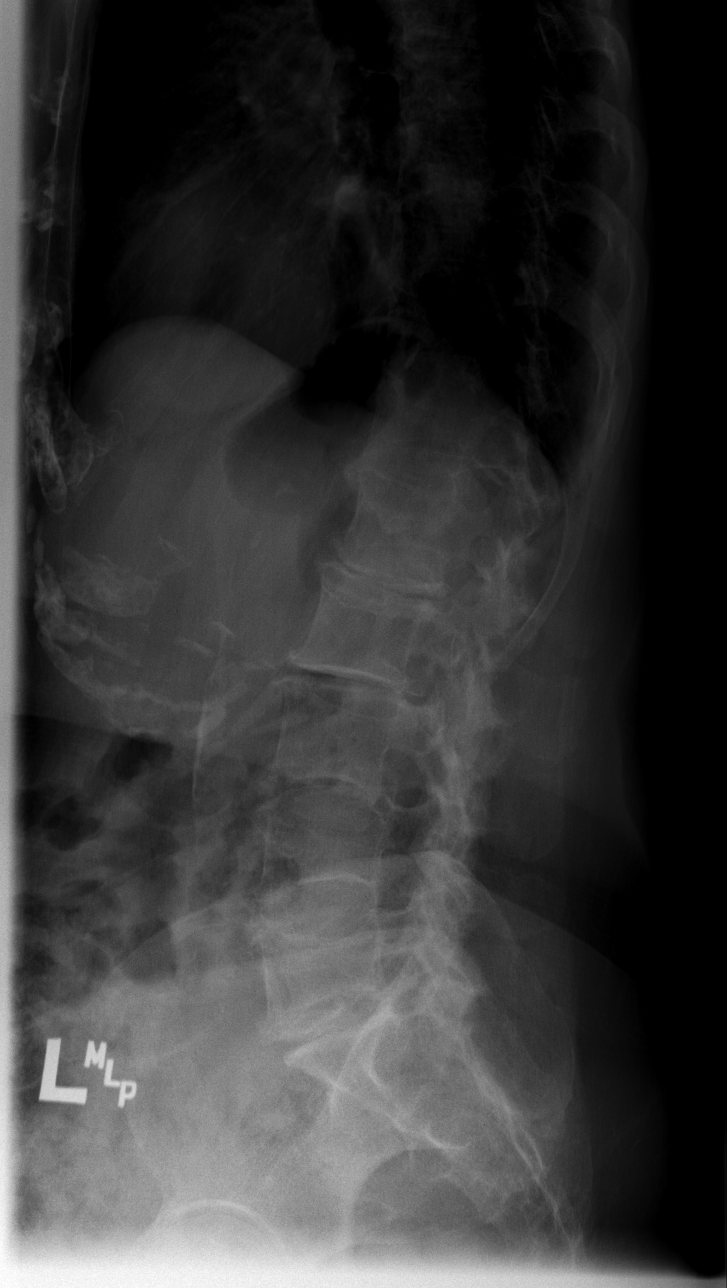

[t l-spine l5-s1 spot]
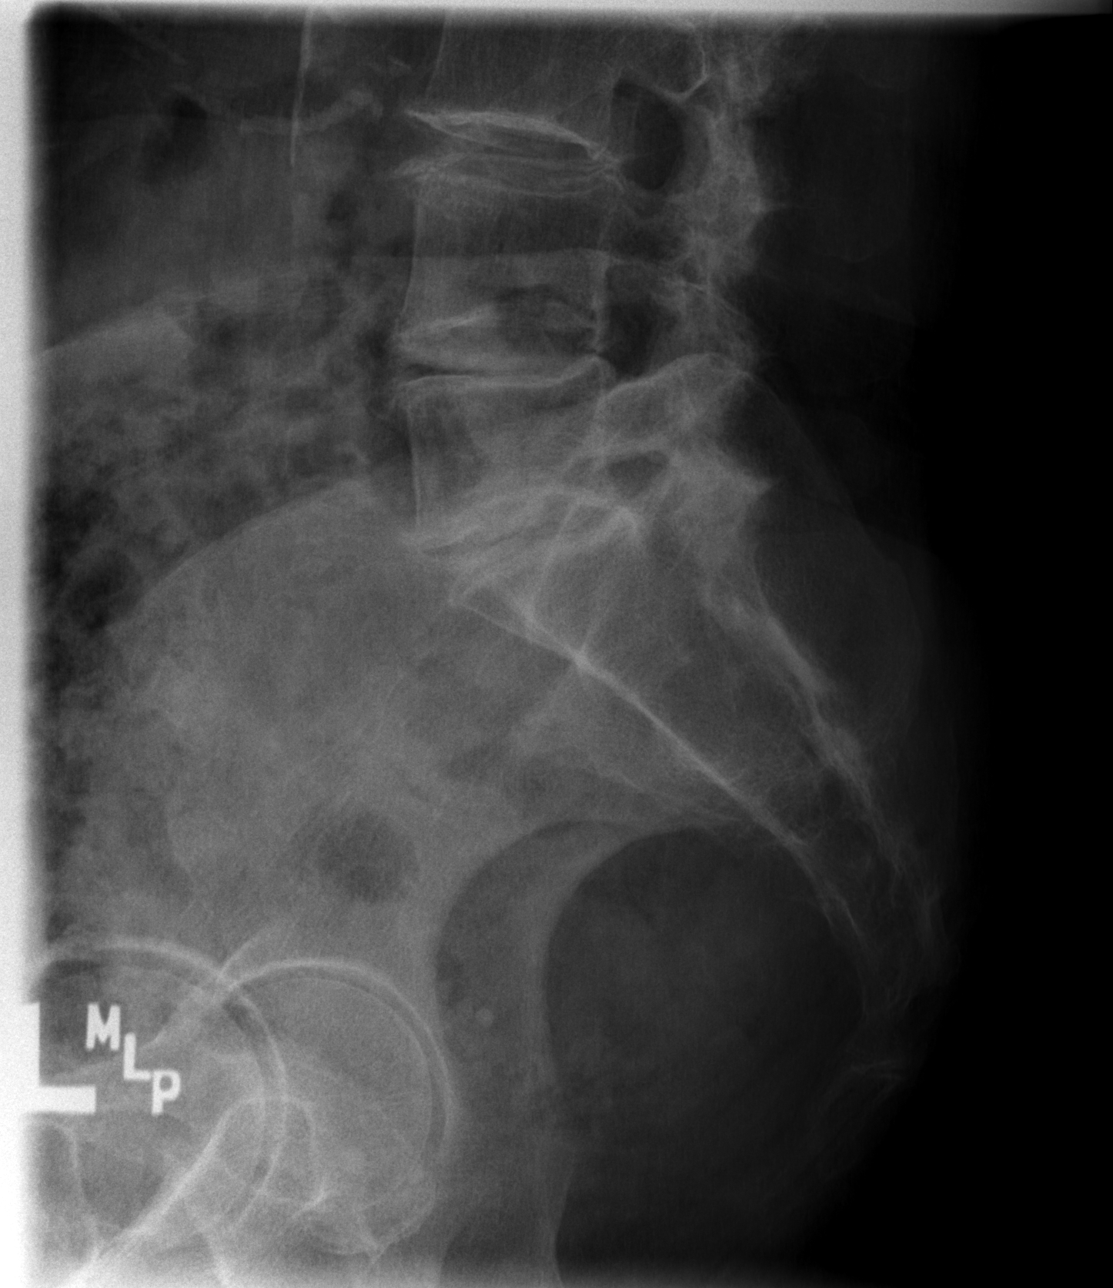

[5 of 5 positions shown; findings below may reference images not displayed]

FINDINGS: Right convex lumbar scoliosis and associated advanced degenerative
lumbar spondylosis with multilevel disc disease and facet disease.
No acute bony findings or destructive bony changes. The visualized
bony pelvis is intact. The SI joints appear normal.

Advanced vascular calcifications without definite aneurysm.
IMPRESSION: 1. Scoliosis and advanced degenerative lumbar spondylosis with
multilevel disc disease and facet disease.
2. No acute bony findings.

## 2021-11-04 ENCOUNTER — Telehealth: Payer: Self-pay | Admitting: Rheumatology

## 2021-11-04 NOTE — Telephone Encounter (Signed)
Patients daughter called the office to see when Danielle Dean's next appointment is. Her next appointment is scheduled for 01/16/22 with Devki. Unsure if that appointment is correct. Should the appointment be with Dr. Corliss Skains since it is a 12mo follow up? She states she is getting Prolia at that appointment but is unsure why she is seeing Devki. Please advise.

## 2021-11-05 NOTE — Telephone Encounter (Signed)
I did not make this appointment with patient. I am not sure why it was made for me. She is due on 01/14/22 for her Prolia so she should not have that appointment with me. That appointment should be with Dr. Estanislado Pandy, and the appointment in June 2023 should be cancelled.  She will need labs completed 2-3 weeks before her appt.  Knox Saliva, PharmD, MPH, BCPS, CPP Clinical Pharmacist (Rheumatology and Pulmonology)

## 2021-11-08 NOTE — Progress Notes (Deleted)
Office Visit Note  Patient: Danielle Dean             Date of Birth: [birth year]           MRN: [id number]             PCP: Wenda Low, MD Referring: Wenda Low, MD Visit Date: 11/22/2021 Occupation: @GUAROCC @  Subjective:  No chief complaint on file.   History of Present Illness: Danielle Dean is a 88 y.o. female ***   Activities of Daily Living:  Patient reports morning stiffness for *** {minute/hour:19697}.   Patient {ACTIONS;DENIES/REPORTS:21021675::"Denies"} nocturnal pain.  Difficulty dressing/grooming: {ACTIONS;DENIES/REPORTS:21021675::"Denies"} Difficulty climbing stairs: {ACTIONS;DENIES/REPORTS:21021675::"Denies"} Difficulty getting out of chair: {ACTIONS;DENIES/REPORTS:21021675::"Denies"} Difficulty using hands for taps, buttons, cutlery, and/or writing: {ACTIONS;DENIES/REPORTS:21021675::"Denies"}  No Rheumatology ROS completed.   PMFS History:  Patient Active Problem List   Diagnosis Date Noted   DDD (degenerative disc disease), thoracic 11/06/2020   Other idiopathic scoliosis, thoracolumbar region 11/06/2020   DDD (degenerative disc disease), lumbar 11/06/2020   Age-related osteoporosis without current pathological fracture 11/06/2020   Porokeratosis 11/26/2018   Corns and callosities 11/26/2018   History of right shoulder replacement 10/07/2016   Chronic pain of both knees 05/20/2016   Osteoarthritis of both hands 05/20/2016   Essential hypertension 05/20/2016   Environmental allergies 05/20/2016   Primary osteoarthritis of both knees 05/19/2016   Callus of foot 11/24/2012   Pain in joint, ankle and foot 09/15/2012    Past Medical History:  Diagnosis Date   Arthritis    Callus    Cataract fragments in both eyes following surgery    Corns and callosities    Hx of hysterectomy    Hypertension    Osteoarthritis     Family History  Problem Relation Age of Onset   Arthritis Mother    Diabetes Father    Past Surgical History:  Procedure  Laterality Date   ABDOMINAL HYSTERECTOMY     NASAL SEPTUM SURGERY     ROTATOR CUFF REPAIR Right    TOTAL SHOULDER ARTHROPLASTY     Social History   Social History Narrative   Not on file   Immunization History  Administered Date(s) Administered   PFIZER(Purple Top)SARS-COV-2 Vaccination 07/06/2019, 07/27/2019, 03/10/2020     Objective: Vital Signs: There were no vitals taken for this visit.   Physical Exam   Musculoskeletal Exam: ***  CDAI Exam: CDAI Score: -- Patient Global: --; Provider Global: -- Swollen: --; Tender: -- Joint Exam 11/22/2021   No joint exam has been documented for this visit   There is currently no information documented on the homunculus. Go to the Rheumatology activity and complete the homunculus joint exam.  Investigation: No additional findings.  Imaging: No results found.  Recent Labs: Lab Results  Component Value Date   WBC 10.2 07/10/2021   HGB 12.2 07/10/2021   PLT 308 07/10/2021   NA 123 (L) 07/10/2021   K 5.2 07/10/2021   CL 89 (L) 07/10/2021   CO2 27 07/10/2021   GLUCOSE 89 07/10/2021   BUN 20 07/10/2021   CREATININE 0.68 07/10/2021   BILITOT 0.8 07/10/2021   ALKPHOS 50 12/20/2020   AST 17 07/10/2021   ALT 14 07/10/2021   PROT 6.5 07/10/2021   ALBUMIN 3.6 12/20/2020   CALCIUM 9.1 07/10/2021   GFRAA 81 06/14/2020    Speciality Comments: Prolia: 06/27/20, 01/16/21  Procedures:  No procedures performed Allergies: Actonel [risedronate], Fosamax [alendronate], and Lisinopril   Assessment / Plan:     Visit  Diagnoses: Age-related osteoporosis without current pathological fracture  Chronic left shoulder pain  History of right shoulder replacement  Primary osteoarthritis of both hands  Primary osteoarthritis of both knees  Other idiopathic scoliosis, thoracolumbar region  DDD (degenerative disc disease), thoracic  DDD (degenerative disc disease), lumbar  Other fatigue  History of hypertension  Orders: No  orders of the defined types were placed in this encounter.  No orders of the defined types were placed in this encounter.   Face-to-face time spent with patient was *** minutes. Greater than 50% of time was spent in counseling and coordination of care.  Follow-Up Instructions: No follow-ups on file.   Ofilia Neas, PA-C  Note - This record has been created using Dragon software.  Chart creation errors have been sought, but may not always  have been located. Such creation errors do not reflect on  the standard of medical care.

## 2021-11-18 ENCOUNTER — Ambulatory Visit: Payer: Medicare Other | Admitting: Podiatry

## 2021-11-20 ENCOUNTER — Telehealth: Payer: Self-pay | Admitting: Rheumatology

## 2021-11-20 NOTE — Telephone Encounter (Signed)
Patient called stating she was scheduled for cortisone injections in her knees on Friday, 6/9, but has a conflict with other appointments.  Patient requested to reschedule with Dr. Corliss Skains.  Please advise day/time

## 2021-11-21 ENCOUNTER — Ambulatory Visit (INDEPENDENT_AMBULATORY_CARE_PROVIDER_SITE_OTHER): Payer: Medicare Other | Admitting: Podiatry

## 2021-11-21 ENCOUNTER — Encounter: Payer: Self-pay | Admitting: Podiatry

## 2021-11-21 DIAGNOSIS — Q828 Other specified congenital malformations of skin: Secondary | ICD-10-CM

## 2021-11-21 NOTE — Progress Notes (Signed)
Subjective:   Patient ID: Danielle Dean, female   DOB: 88 y.o.   MRN: [id number]   HPI Patient presents with chronic lesions bottom of both feet that are painful   ROS      Objective:  Physical Exam  Neurovascular status intact keratotic lesion x2 bilateral with lucent cords that are painful     Assessment:  Chronic keratotic lesions painful bilateral     Plan:  Sharp debridement accomplished slight iatrogenic bleeding of the left 1 and I did apply Band-Aid with small amount of Neosporin that should be uneventful and instructed on coming back as symptoms indicate

## 2021-11-22 ENCOUNTER — Ambulatory Visit: Payer: Medicare Other | Admitting: Rheumatology

## 2021-11-22 DIAGNOSIS — Z8679 Personal history of other diseases of the circulatory system: Secondary | ICD-10-CM

## 2021-11-22 DIAGNOSIS — R5383 Other fatigue: Secondary | ICD-10-CM

## 2021-11-22 DIAGNOSIS — M810 Age-related osteoporosis without current pathological fracture: Secondary | ICD-10-CM

## 2021-11-22 DIAGNOSIS — M19041 Primary osteoarthritis, right hand: Secondary | ICD-10-CM

## 2021-11-22 DIAGNOSIS — M4125 Other idiopathic scoliosis, thoracolumbar region: Secondary | ICD-10-CM

## 2021-11-22 DIAGNOSIS — M5134 Other intervertebral disc degeneration, thoracic region: Secondary | ICD-10-CM

## 2021-11-22 DIAGNOSIS — G8929 Other chronic pain: Secondary | ICD-10-CM

## 2021-11-22 DIAGNOSIS — M170 Bilateral primary osteoarthritis of knee: Secondary | ICD-10-CM

## 2021-11-22 DIAGNOSIS — Z96611 Presence of right artificial shoulder joint: Secondary | ICD-10-CM

## 2021-11-22 DIAGNOSIS — M5136 Other intervertebral disc degeneration, lumbar region: Secondary | ICD-10-CM

## 2021-11-25 NOTE — Telephone Encounter (Signed)
LMOM for appt 11/26/2021?

## 2021-12-04 ENCOUNTER — Ambulatory Visit (INDEPENDENT_AMBULATORY_CARE_PROVIDER_SITE_OTHER): Payer: Medicare Other | Admitting: Rheumatology

## 2021-12-04 ENCOUNTER — Encounter: Payer: Self-pay | Admitting: Rheumatology

## 2021-12-04 VITALS — BP 149/60 | HR 65 | Resp 16 | Ht 58.5 in | Wt 101.0 lb

## 2021-12-04 DIAGNOSIS — S46212A Strain of muscle, fascia and tendon of other parts of biceps, left arm, initial encounter: Secondary | ICD-10-CM | POA: Diagnosis not present

## 2021-12-04 DIAGNOSIS — M19041 Primary osteoarthritis, right hand: Secondary | ICD-10-CM

## 2021-12-04 DIAGNOSIS — M170 Bilateral primary osteoarthritis of knee: Secondary | ICD-10-CM | POA: Diagnosis not present

## 2021-12-04 DIAGNOSIS — Z96611 Presence of right artificial shoulder joint: Secondary | ICD-10-CM

## 2021-12-04 DIAGNOSIS — M19042 Primary osteoarthritis, left hand: Secondary | ICD-10-CM

## 2021-12-04 DIAGNOSIS — Z8679 Personal history of other diseases of the circulatory system: Secondary | ICD-10-CM

## 2021-12-04 DIAGNOSIS — M810 Age-related osteoporosis without current pathological fracture: Secondary | ICD-10-CM

## 2021-12-04 DIAGNOSIS — M4125 Other idiopathic scoliosis, thoracolumbar region: Secondary | ICD-10-CM

## 2021-12-04 DIAGNOSIS — E871 Hypo-osmolality and hyponatremia: Secondary | ICD-10-CM

## 2021-12-04 DIAGNOSIS — M5136 Other intervertebral disc degeneration, lumbar region: Secondary | ICD-10-CM

## 2021-12-04 DIAGNOSIS — M5134 Other intervertebral disc degeneration, thoracic region: Secondary | ICD-10-CM

## 2021-12-04 MED ORDER — TRIAMCINOLONE ACETONIDE 40 MG/ML IJ SUSP
40.0000 mg | INTRAMUSCULAR | Status: AC | PRN
  Administered 2021-12-04: 40 mg via INTRA_ARTICULAR

## 2021-12-04 MED ORDER — LIDOCAINE HCL 1 % IJ SOLN
1.5000 mL | INTRAMUSCULAR | Status: AC | PRN
  Administered 2021-12-04: 1.5 mL

## 2021-12-04 NOTE — Progress Notes (Signed)
Office Visit Note  Patient: Danielle Dean             Date of Birth: 1928/10/09           MRN: [id number]             PCP: Georgann Housekeeper, MD Referring: Georgann Housekeeper, MD Visit Date: 12/04/2021 Occupation: @GUAROCC @  Subjective:  Pain of the Right Knee and Pain of the Left Knee   History of Present Illness: Danielle Dean is a 88 y.o. female history of osteoarthritis and degenerative disc disease.  She went to [place] in March.  She was hospitalized there due to hyponatremia and abdominal discomfort.  She had IV hydration and was started on salt tablets.  Her labs have been monitored by Dr. April now.  She has been experiencing increased left shoulder pain and also bilateral knee joint pain for the last few weeks.  She requests getting cortisone injection to her bilateral knee joints.  She also noticed a bruise on her left shoulder.  She does not recall injuring her left shoulder.  None of the other joints are painful.  Activities of Daily Living:  Patient reports morning stiffness for 0  none .   Patient Denies nocturnal pain.  Difficulty dressing/grooming: Reports Difficulty climbing stairs: Reports Difficulty getting out of chair: Denies Difficulty using hands for taps, buttons, cutlery, and/or writing: Reports  Review of Systems  Constitutional:  Negative for fatigue.  HENT:  Negative for mouth dryness.   Eyes:  Positive for dryness.  Respiratory:  Negative for shortness of breath.   Cardiovascular:  Negative for swelling in legs/feet.  Gastrointestinal:  Negative for constipation.  Endocrine: Negative for increased urination.  Genitourinary:  Negative for difficulty urinating.  Musculoskeletal:  Positive for joint pain, gait problem, joint pain, joint swelling and muscle weakness.  Skin:  Positive for redness.  Allergic/Immunologic: Positive for susceptible to infections.  Neurological:  Positive for numbness.  Hematological:  Positive for bruising/bleeding  tendency.  Psychiatric/Behavioral:  Positive for sleep disturbance.     PMFS History:  Patient Active Problem List   Diagnosis Date Noted   DDD (degenerative disc disease), thoracic 11/06/2020   Other idiopathic scoliosis, thoracolumbar region 11/06/2020   DDD (degenerative disc disease), lumbar 11/06/2020   Age-related osteoporosis without current pathological fracture 11/06/2020   Porokeratosis 11/26/2018   Corns and callosities 11/26/2018   History of right shoulder replacement 10/07/2016   Chronic pain of both knees 05/20/2016   Osteoarthritis of both hands 05/20/2016   Essential hypertension 05/20/2016   Environmental allergies 05/20/2016   Primary osteoarthritis of both knees 05/19/2016   Callus of foot 11/24/2012   Pain in joint, ankle and foot 09/15/2012    Past Medical History:  Diagnosis Date   Arthritis    Callus    Cataract fragments in both eyes following surgery    Corns and callosities    Hx of hysterectomy    Hypertension    Osteoarthritis     Family History  Problem Relation Age of Onset   Arthritis Mother    Diabetes Father    Past Surgical History:  Procedure Laterality Date   ABDOMINAL HYSTERECTOMY     NASAL SEPTUM SURGERY     ROTATOR CUFF REPAIR Right    TOTAL SHOULDER ARTHROPLASTY     Social History   Social History Narrative   Not on file   Immunization History  Administered Date(s) Administered   PFIZER(Purple Top)SARS-COV-2 Vaccination 07/06/2019, 07/27/2019, 03/10/2020  Objective: Vital Signs: BP (!) 149/60 (BP Location: Right Arm, Patient Position: Sitting, Cuff Size: Normal)   Pulse 65   Resp 16   Ht 4' 10.5" (1.486 m)   Wt 101 lb (45.8 kg)   BMI 20.75 kg/m    Physical Exam Vitals and nursing note reviewed.  Constitutional:      Appearance: She is well-developed.  HENT:     Head: Normocephalic and atraumatic.  Eyes:     Conjunctiva/sclera: Conjunctivae normal.  Cardiovascular:     Rate and Rhythm: Normal rate and  regular rhythm.     Heart sounds: Normal heart sounds.  Pulmonary:     Effort: Pulmonary effort is normal.     Breath sounds: Normal breath sounds.  Abdominal:     General: Bowel sounds are normal.     Palpations: Abdomen is soft.  Musculoskeletal:     Cervical back: Normal range of motion.  Lymphadenopathy:     Cervical: No cervical adenopathy.  Skin:    General: Skin is warm and dry.     Capillary Refill: Capillary refill takes less than 2 seconds.  Neurological:     Mental Status: She is alert and oriented to person, place, and time.  Psychiatric:        Behavior: Behavior normal.      Musculoskeletal Exam: C-spine was in good range of motion.  She had right shoulder abduction about 120 degrees without any discomfort.  Left shoulder joint abduction was up to 90 degrees with some discomfort.  Left biceps tendon rupture with hematoma was noted on her left distal arm.  Elbow joints and wrist joints with good range of motion.  Bilateral CMC PIP and DIP thickening was noted.  Hip joints with good range of motion.  Knee joints in good range of motion without any warmth swelling or effusion.  There was no tenderness over ankles or MTPs.  CDAI Exam: CDAI Score: -- Patient Global: --; Provider Global: -- Swollen: --; Tender: -- Joint Exam 12/04/2021   No joint exam has been documented for this visit   There is currently no information documented on the homunculus. Go to the Rheumatology activity and complete the homunculus joint exam.  Investigation: No additional findings.  Imaging: No results found.  Recent Labs: Lab Results  Component Value Date   WBC 10.2 07/10/2021   HGB 12.2 07/10/2021   PLT 308 07/10/2021   NA 123 (L) 07/10/2021   K 5.2 07/10/2021   CL 89 (L) 07/10/2021   CO2 27 07/10/2021   GLUCOSE 89 07/10/2021   BUN 20 07/10/2021   CREATININE 0.68 07/10/2021   BILITOT 0.8 07/10/2021   ALKPHOS 50 12/20/2020   AST 17 07/10/2021   ALT 14 07/10/2021   PROT 6.5  07/10/2021   ALBUMIN 3.6 12/20/2020   CALCIUM 9.1 07/10/2021   GFRAA 81 06/14/2020    Speciality Comments: Prolia: 06/27/20, 01/16/21,07/18/2021  Procedures:  Large Joint Inj: bilateral knee on 12/04/2021 9:07 AM Indications: pain Details: 27 G 1.5 in needle, medial approach  Arthrogram: No  Medications (Right): 1.5 mL lidocaine 1 %; 40 mg triamcinolone acetonide 40 MG/ML Medications (Left): 1.5 mL lidocaine 1 %; 40 mg triamcinolone acetonide 40 MG/ML Outcome: tolerated well, no immediate complications Procedure, treatment alternatives, risks and benefits explained, specific risks discussed. Consent was given by the patient. Immediately prior to procedure a time out was called to verify the correct patient, procedure, equipment, support staff and site/side marked as required. Patient was prepped and draped  in the usual sterile fashion.     Allergies: Actonel [risedronate], Fosamax [alendronate], and Lisinopril   Assessment / Plan:     Visit Diagnoses: Primary osteoarthritis of both knees-she has severe osteoarthritis of bilateral knee joints and severe chondromalacia patella.  Last x-rays were obtained in June 2020.  She continues to have pain and discomfort.  She had an adequate response to Visco supplement injections in the past.  She gets relief with cortisone injections.  She requested cortisone injections today.  Last cortisone injection were in October 2022.  She also would like to have repeat cortisone injections in September prior to going to a trip.  After informed consent was obtained and side effects were discussed bilateral knee joint were injected with lidocaine and cortisone as described above.  She tolerated the procedure well.  Postprocedure instructions were given.  Rupture of left biceps tendon, initial encounter-left biceps tendon rupture was noted to with hematoma formation.  This most likely happened about 10 days ago.  Her symptoms are gradually getting better.   Considering her age she would not benefit from surgery.  History of right shoulder replacement - in [facility].  She had abduction of 220 degrees without discomfort.  Primary osteoarthritis of both hands-bilateral PIP and DIP thickening was noted.  Joint protection muscle strengthening was discussed.  DDD (degenerative disc disease), thoracic-she has thoracic kyphosis and height loss.  DDD (degenerative disc disease), lumbar-she has chronic pain.  The pain is fairly well controlled currently.  Patient was placed on Cymbalta but it was discontinued due to hyponatremia per patient.  Other idiopathic scoliosis, thoracolumbar region  Age-related osteoporosis without current pathological fracture - May 04, 2020 DEXA: 1/3 Left distal radius BMD 0.421 with T-score -4.6. prolia injection: Prolia was a started on June 27, 2020.  Last Prolia injection was on July 18, 2021.  She will be coming back in July for her next injection.  Use of calcium rich diet and vitamin D was discussed.  Vitamin D was 55 on July 10, 2021.  History of hypertension-her systolic blood pressure is slightly elevated.  She is currently on salt tablets due to hyponatremia.  Hyponatremia-patient was hospitalized with hyponatremia while she was in [place].  She states her labs have been monitored closely by Dr.Husain  Orders: Orders Placed This Encounter  Procedures   Large Joint Inj   No orders of the defined types were placed in this encounter.   Face-to-face time spent with patient was 30 minutes. Greater than 50% of time was spent in counseling and coordination of care.  Follow-Up Instructions: Return in about 3 months (around 03/06/2022) for Osteoarthritis.   Bo Merino, MD  Note - This record has been created using Editor, commissioning.  Chart creation errors have been sought, but may not always  have been located. Such creation errors do not reflect on  the standard of medical care.

## 2021-12-11 ENCOUNTER — Ambulatory Visit: Payer: Medicare Other | Admitting: Rheumatology

## 2022-01-02 ENCOUNTER — Telehealth: Payer: Self-pay | Admitting: Pharmacist

## 2022-01-02 ENCOUNTER — Other Ambulatory Visit (HOSPITAL_COMMUNITY): Payer: Self-pay

## 2022-01-02 DIAGNOSIS — M810 Age-related osteoporosis without current pathological fracture: Secondary | ICD-10-CM

## 2022-01-02 DIAGNOSIS — Z5181 Encounter for therapeutic drug level monitoring: Secondary | ICD-10-CM

## 2022-01-02 MED ORDER — DENOSUMAB 60 MG/ML ~~LOC~~ SOSY
60.0000 mg | PREFILLED_SYRINGE | SUBCUTANEOUS | 0 refills | Status: DC
  Filled 2022-01-02: qty 1, 180d supply, fill #0

## 2022-01-02 NOTE — Telephone Encounter (Signed)
Patient due for Prolia on 01/14/22.  She will plan to come Tuesday, 01/07/22 for labs. Future order for CBC and CMET placed today.  Rx for Prolia sent to [facility] to be couriered to clinic by 01/14/22  Chesley Mires, PharmD, MPH, BCPS, CPP Clinical Pharmacist (Rheumatology and Pulmonology)

## 2022-01-07 ENCOUNTER — Other Ambulatory Visit: Payer: Self-pay | Admitting: *Deleted

## 2022-01-07 DIAGNOSIS — M810 Age-related osteoporosis without current pathological fracture: Secondary | ICD-10-CM

## 2022-01-07 DIAGNOSIS — Z5181 Encounter for therapeutic drug level monitoring: Secondary | ICD-10-CM

## 2022-01-07 LAB — COMPREHENSIVE METABOLIC PANEL
AG Ratio: 1.5 (calc) (ref 1.0–2.5)
ALT: 13 U/L (ref 6–29)
AST: 15 U/L (ref 10–35)
Albumin: 3.8 g/dL (ref 3.6–5.1)
Alkaline phosphatase (APISO): 56 U/L (ref 37–153)
BUN: 21 mg/dL (ref 7–25)
CO2: 26 mmol/L (ref 20–32)
Calcium: 8.9 mg/dL (ref 8.6–10.4)
Chloride: 98 mmol/L (ref 98–110)
Creat: 0.69 mg/dL (ref 0.60–0.95)
Globulin: 2.5 g/dL (calc) (ref 1.9–3.7)
Glucose, Bld: 92 mg/dL (ref 65–99)
Potassium: 5.1 mmol/L (ref 3.5–5.3)
Sodium: 130 mmol/L — ABNORMAL LOW (ref 135–146)
Total Bilirubin: 0.5 mg/dL (ref 0.2–1.2)
Total Protein: 6.3 g/dL (ref 6.1–8.1)

## 2022-01-07 LAB — CBC WITH DIFFERENTIAL/PLATELET
Absolute Monocytes: 953 cells/uL — ABNORMAL HIGH (ref 200–950)
Basophils Absolute: 38 cells/uL (ref 0–200)
Basophils Relative: 0.5 %
Eosinophils Absolute: 128 cells/uL (ref 15–500)
Eosinophils Relative: 1.7 %
HCT: 33.7 % — ABNORMAL LOW (ref 35.0–45.0)
Hemoglobin: 11.2 g/dL — ABNORMAL LOW (ref 11.7–15.5)
Lymphs Abs: 1778 cells/uL (ref 850–3900)
MCH: 30.5 pg (ref 27.0–33.0)
MCHC: 33.2 g/dL (ref 32.0–36.0)
MCV: 91.8 fL (ref 80.0–100.0)
MPV: 9.1 fL (ref 7.5–12.5)
Monocytes Relative: 12.7 %
Neutro Abs: 4605 cells/uL (ref 1500–7800)
Neutrophils Relative %: 61.4 %
Platelets: 219 10*3/uL (ref 140–400)
RBC: 3.67 10*6/uL — ABNORMAL LOW (ref 3.80–5.10)
RDW: 11.8 % (ref 11.0–15.0)
Total Lymphocyte: 23.7 %
WBC: 7.5 10*3/uL (ref 3.8–10.8)

## 2022-01-07 NOTE — Progress Notes (Signed)
CBC and CMP are normal except hemoglobin is low and stable.

## 2022-01-08 ENCOUNTER — Other Ambulatory Visit (HOSPITAL_COMMUNITY): Payer: Self-pay

## 2022-01-08 NOTE — Telephone Encounter (Signed)
Prolia received from [facility]. Placed in refrigerator  Layken Doenges, PharmD, MPH, BCPS, CPP Clinical Pharmacist (Rheumatology and Pulmonology) 

## 2022-01-08 NOTE — Telephone Encounter (Signed)
CBC, CMP from 01/07/22 wnl.  Prolia rx already sent to [facility] and dispensed by pharmacy today.  Patient scheduled for Prolia injection on 01/16/22.  Chesley Mires, PharmD, MPH, BCPS, CPP Clinical Pharmacist (Rheumatology and Pulmonology)

## 2022-01-16 ENCOUNTER — Ambulatory Visit: Payer: Medicare Other | Attending: Rheumatology | Admitting: Pharmacist

## 2022-01-16 ENCOUNTER — Ambulatory Visit: Payer: Medicare Other | Admitting: Pharmacist

## 2022-01-16 ENCOUNTER — Ambulatory Visit: Payer: Medicare Other | Admitting: Rheumatology

## 2022-01-16 VITALS — BP 155/66 | HR 66

## 2022-01-16 DIAGNOSIS — Z7689 Persons encountering health services in other specified circumstances: Secondary | ICD-10-CM | POA: Diagnosis present

## 2022-01-16 DIAGNOSIS — M810 Age-related osteoporosis without current pathological fracture: Secondary | ICD-10-CM | POA: Insufficient documentation

## 2022-01-16 MED ORDER — DENOSUMAB 60 MG/ML ~~LOC~~ SOSY
60.0000 mg | PREFILLED_SYRINGE | Freq: Once | SUBCUTANEOUS | Status: AC
  Administered 2022-01-16: 60 mg via SUBCUTANEOUS

## 2022-01-16 NOTE — Progress Notes (Signed)
Pharmacy Note  Subjective:   Patient presents to clinic today to receive bi-annual dose of Prolia. Her last dose of Prolia was on 07/18/21  Patient running a fever or have signs/symptoms of infection? No  Patient currently on antibiotics for the treatment of infection? No  Patient had fall in the last 6 months?  No    Patient taking calcium 1200 mg daily through diet or supplement and at least 800 units vitamin D? Yes  Objective: CMP     Component Value Date/Time   NA 130 (L) 01/07/2022 1109   K 5.1 01/07/2022 1109   CL 98 01/07/2022 1109   CO2 26 01/07/2022 1109   GLUCOSE 92 01/07/2022 1109   BUN 21 01/07/2022 1109   CREATININE 0.69 01/07/2022 1109   CALCIUM 8.9 01/07/2022 1109   PROT 6.3 01/07/2022 1109   ALBUMIN 3.6 12/20/2020 1422   AST 15 01/07/2022 1109   ALT 13 01/07/2022 1109   ALKPHOS 50 12/20/2020 1422   BILITOT 0.5 01/07/2022 1109   GFRNONAA >60 12/20/2020 1422   GFRNONAA 70 06/14/2020 0000   GFRAA 81 06/14/2020 0000    CBC    Component Value Date/Time   WBC 7.5 01/07/2022 1109   RBC 3.67 (L) 01/07/2022 1109   HGB 11.2 (L) 01/07/2022 1109   HCT 33.7 (L) 01/07/2022 1109   PLT 219 01/07/2022 1109   MCV 91.8 01/07/2022 1109   MCH 30.5 01/07/2022 1109   MCHC 33.2 01/07/2022 1109   RDW 11.8 01/07/2022 1109   LYMPHSABS 1,778 01/07/2022 1109   MONOABS 0.8 12/20/2020 1422   EOSABS 128 01/07/2022 1109   BASOSABS 38 01/07/2022 1109    Lab Results  Component Value Date   VD25OH 55 07/10/2021   T-score: May 04, 2020 DEXA: 1/3 Left distal radius BMD 0.421 with T-score -4.6  Assessment/Plan:   Patient tolerated injection without issue.   Administrations This Visit     denosumab (PROLIA) injection 60 mg     Admin Date 01/16/2022 Action Given Dose 60 mg Route Subcutaneous Administered By Murrell Redden, RPH-CPP           Her next dose of Prolia is due on 07/15/22 Next DEXa scan is due November 2023. Order placed today for [facility]  All  questions encouraged and answered.  Instructed patient to call with any further questions or concerns.

## 2022-02-11 ENCOUNTER — Telehealth: Payer: Self-pay | Admitting: Rheumatology

## 2022-02-11 NOTE — Telephone Encounter (Signed)
Patients son called the office stating Danielle Dean has a bone density schedule with [facility] but [facility] Braver called and told them she cannot be scheduled before November because that is when they did it last. They would like advise on what to do. Patients appointment with Garald Braver was cancelled.

## 2022-02-11 NOTE — Telephone Encounter (Signed)
I called patient, November is correct for her DEXA appt - every 2 years, patient will call [facility] to schedule appt.

## 2022-02-27 NOTE — Progress Notes (Signed)
Office Visit Note  Patient: Danielle Dean             Date of Birth: 13-Dec-1928           MRN: [id number]             PCP: Georgann Housekeeper, MD Referring: Georgann Housekeeper, MD Visit Date: 03/12/2022 Occupation: @GUAROCC @  Subjective:  Pain in both knees and lower back  History of Present Illness: Danielle Dean is a 88 y.o. female with history of osteoarthritis and degenerative disc disease.  She continues to have pain and stiffness in her bilateral hands.  She denies any swelling.  She states her left shoulder joint continues to hurt.  She has been having increased lower back pain which radiates into her right hip.  She complains of pain and discomfort in her bilateral knee joints.  She is about to go on a cruise to [place] and would like to have bilateral knee joints injected.  Activities of Daily Living:  Patient reports morning stiffness for a few minutes.   Patient Denies nocturnal pain.  Difficulty dressing/grooming: Denies Difficulty climbing stairs: Reports Difficulty getting out of chair: Denies Difficulty using hands for taps, buttons, cutlery, and/or writing: Reports  Review of Systems  Constitutional:  Negative for fatigue.  HENT:  Negative for mouth sores and mouth dryness.   Eyes:  Positive for dryness.  Respiratory:  Negative for shortness of breath.   Cardiovascular:  Negative for chest pain and palpitations.  Gastrointestinal:  Positive for constipation. Negative for blood in stool and diarrhea.  Endocrine: Negative for increased urination.  Genitourinary:  Negative for involuntary urination.  Musculoskeletal:  Positive for joint pain, joint pain, joint swelling, myalgias, morning stiffness, muscle tenderness and myalgias. Negative for gait problem and muscle weakness.  Skin:  Negative for color change, rash, hair loss and sensitivity to sunlight.  Allergic/Immunologic: Negative for susceptible to infections.  Neurological:  Negative for dizziness and  headaches.  Hematological:  Negative for swollen glands.  Psychiatric/Behavioral:  Negative for depressed mood and sleep disturbance. The patient is not nervous/anxious.     PMFS History:  Patient Active Problem List   Diagnosis Date Noted   DDD (degenerative disc disease), thoracic 11/06/2020   Other idiopathic scoliosis, thoracolumbar region 11/06/2020   DDD (degenerative disc disease), lumbar 11/06/2020   Age-related osteoporosis without current pathological fracture 11/06/2020   Porokeratosis 11/26/2018   Corns and callosities 11/26/2018   History of right shoulder replacement 10/07/2016   Chronic pain of both knees 05/20/2016   Osteoarthritis of both hands 05/20/2016   Essential hypertension 05/20/2016   Environmental allergies 05/20/2016   Primary osteoarthritis of both knees 05/19/2016   Callus of foot 11/24/2012   Pain in joint, ankle and foot 09/15/2012    Past Medical History:  Diagnosis Date   Arthritis    Callus    Cataract fragments in both eyes following surgery    Corns and callosities    Hx of hysterectomy    Hypertension    Osteoarthritis     Family History  Problem Relation Age of Onset   Arthritis Mother    Diabetes Father    Past Surgical History:  Procedure Laterality Date   ABDOMINAL HYSTERECTOMY     NASAL SEPTUM SURGERY     ROTATOR CUFF REPAIR Right    TOTAL SHOULDER ARTHROPLASTY     Social History   Social History Narrative   Not on file   Immunization History  Administered Date(s) Administered  PFIZER(Purple Top)SARS-COV-2 Vaccination 07/06/2019, 07/27/2019, 03/10/2020     Objective: Vital Signs: BP (!) 148/70 (BP Location: Left Arm, Patient Position: Sitting, Cuff Size: Normal)   Pulse 65   Resp 14   Ht 5' (1.524 m)   Wt 102 lb 6.4 oz (46.4 kg)   BMI 20.00 kg/m    Physical Exam Vitals and nursing note reviewed.  Constitutional:      Appearance: She is well-developed.  HENT:     Head: Normocephalic and atraumatic.  Eyes:      Conjunctiva/sclera: Conjunctivae normal.  Cardiovascular:     Rate and Rhythm: Normal rate and regular rhythm.     Heart sounds: Normal heart sounds.  Pulmonary:     Effort: Pulmonary effort is normal.     Breath sounds: Normal breath sounds.  Abdominal:     General: Bowel sounds are normal.     Palpations: Abdomen is soft.  Musculoskeletal:     Cervical back: Normal range of motion.  Lymphadenopathy:     Cervical: No cervical adenopathy.  Skin:    General: Skin is warm and dry.     Capillary Refill: Capillary refill takes less than 2 seconds.  Neurological:     Mental Status: She is alert and oriented to person, place, and time.  Psychiatric:        Behavior: Behavior normal.      Musculoskeletal Exam: She had limited lateral rotation of the cervical spine.  She had thoracic kyphosis.  She had discomfort in the lower lumbar region.  Right shoulder joint abduction was limited to 120 degrees.  She had good range of motion of her left shoulder joint with some discomfort.  Elbow joints and wrist joints with good range of motion.  She has severe bilateral PIP and DIP thickening with subluxation of PIP and DIP joints.  Bilateral CMC thickening was noted.  Hip joints were in good range of motion.  Knee joints were in good range of motion without any warmth swelling or effusion.  There was no tenderness over ankles or MTPs.    CDAI Exam: CDAI Score: -- Patient Global: --; Provider Global: -- Swollen: --; Tender: -- Joint Exam 03/12/2022   No joint exam has been documented for this visit   There is currently no information documented on the homunculus. Go to the Rheumatology activity and complete the homunculus joint exam.  Investigation: No additional findings.  Imaging: No results found.  Recent Labs: Lab Results  Component Value Date   WBC 7.5 01/07/2022   HGB 11.2 (L) 01/07/2022   PLT 219 01/07/2022   NA 130 (L) 01/07/2022   K 5.1 01/07/2022   CL 98 01/07/2022    CO2 26 01/07/2022   GLUCOSE 92 01/07/2022   BUN 21 01/07/2022   CREATININE 0.69 01/07/2022   BILITOT 0.5 01/07/2022   ALKPHOS 50 12/20/2020   AST 15 01/07/2022   ALT 13 01/07/2022   PROT 6.3 01/07/2022   ALBUMIN 3.6 12/20/2020   CALCIUM 8.9 01/07/2022   GFRAA 81 06/14/2020    Speciality Comments: Prolia: 06/27/20, 01/16/21,07/18/2021  Procedures:  Large Joint Inj: bilateral knee on 03/12/2022 3:56 PM Indications: pain Details: 27 G 1.5 in needle, medial approach  Arthrogram: No  Medications (Right): 1.5 mL lidocaine 1 %; 40 mg triamcinolone acetonide 40 MG/ML Aspirate (Right): 0 mL Medications (Left): 1.5 mL lidocaine 1 %; 40 mg triamcinolone acetonide 40 MG/ML Aspirate (Left): 0 mL Outcome: tolerated well, no immediate complications Procedure, treatment alternatives, risks and benefits  explained, specific risks discussed. Consent was given by the patient. Immediately prior to procedure a time out was called to verify the correct patient, procedure, equipment, support staff and site/side marked as required. Patient was prepped and draped in the usual sterile fashion.     Allergies: Actonel [risedronate], Fosamax [alendronate], and Lisinopril   Assessment / Plan:     Visit Diagnoses: Primary osteoarthritis of both knees - she has severe osteoarthritis of bilateral knee joints and severe chondromalacia patella. -She continues to have pain and discomfort in her knee joints.  She wanted to have her knee joints injected right before going on the cruise to [place].  After informed consent was obtained per patient's request bilateral knee joints were injected with lidocaine and cortisone as described above.  Patient tolerated the procedure well.  Postprocedure instructions were given.  She also has poor balance.  She has been wearing knee braces.  Lower extremity muscle strengthening exercises were discussed.  I will also refer to physical therapy.  Plan: Ambulatory referral to Physical  Therapy  Rupture of left biceps tendon, sequela-she had good range of motion in her left shoulder joint.  She had minimal discomfort with range of motion.  History of right shoulder replacement - in [facility].  She continues to have some discomfort in right shoulder.  She had limited abduction of the right shoulder.  Primary osteoarthritis of both hands-she had bilateral CMC thickening PIP and DIP thickening and subluxation of PIP and DIP joints.  Joint protection muscle strengthening was discussed.  DDD (degenerative disc disease), thoracic - she has thoracic kyphosis and height loss.  DDD (degenerative disc disease), lumbar -she continues to have lower back pain.  She states the pain has been radiating to her right hip.  I will refer her to physical therapy.  Plan: Ambulatory referral to Physical Therapy  Other idiopathic scoliosis, thoracolumbar region  Poor balance -she complains of poor balance.  Plan: Ambulatory referral to Physical Therapy  Age-related osteoporosis without current pathological fracture - May 04, 2020 DEXA: 1/3 Left distal radius BMD 0.421 with T-score -4.6. Prolia was a started on June 27, 2020. last prolia inj: 01/16/2022.  Labs obtained on January 07, 2022 showed hemoglobin of 11.2.  CMP was normal.  Use of calcium and vitamin D and resistive exercises were emphasized.  We will discuss repeat DEXA scan at the follow-up visit.  History of hypertension-systolic blood pressure was elevated today.  She was advised to monitor blood pressure closely.  Hyponatremia  Orders: Orders Placed This Encounter  Procedures   Ambulatory referral to Physical Therapy   No orders of the defined types were placed in this encounter.   Follow-Up Instructions: Return in about 3 months (around 06/11/2022) for Osteoarthritis.   Pollyann Savoy, MD  Note - This record has been created using Animal nutritionist.  Chart creation errors have been sought, but may not always  have been  located. Such creation errors do not reflect on  the standard of medical care.

## 2022-03-12 ENCOUNTER — Encounter: Payer: Self-pay | Admitting: Rheumatology

## 2022-03-12 ENCOUNTER — Ambulatory Visit: Payer: Medicare Other | Attending: Rheumatology | Admitting: Rheumatology

## 2022-03-12 VITALS — BP 148/70 | HR 65 | Resp 14 | Ht 60.0 in | Wt 102.4 lb

## 2022-03-12 DIAGNOSIS — M19042 Primary osteoarthritis, left hand: Secondary | ICD-10-CM | POA: Diagnosis present

## 2022-03-12 DIAGNOSIS — M810 Age-related osteoporosis without current pathological fracture: Secondary | ICD-10-CM

## 2022-03-12 DIAGNOSIS — Z8679 Personal history of other diseases of the circulatory system: Secondary | ICD-10-CM

## 2022-03-12 DIAGNOSIS — M19041 Primary osteoarthritis, right hand: Secondary | ICD-10-CM | POA: Diagnosis present

## 2022-03-12 DIAGNOSIS — S46212S Strain of muscle, fascia and tendon of other parts of biceps, left arm, sequela: Secondary | ICD-10-CM

## 2022-03-12 DIAGNOSIS — E871 Hypo-osmolality and hyponatremia: Secondary | ICD-10-CM | POA: Diagnosis present

## 2022-03-12 DIAGNOSIS — M5134 Other intervertebral disc degeneration, thoracic region: Secondary | ICD-10-CM

## 2022-03-12 DIAGNOSIS — R2689 Other abnormalities of gait and mobility: Secondary | ICD-10-CM

## 2022-03-12 DIAGNOSIS — M5136 Other intervertebral disc degeneration, lumbar region: Secondary | ICD-10-CM

## 2022-03-12 DIAGNOSIS — S46212A Strain of muscle, fascia and tendon of other parts of biceps, left arm, initial encounter: Secondary | ICD-10-CM

## 2022-03-12 DIAGNOSIS — M170 Bilateral primary osteoarthritis of knee: Secondary | ICD-10-CM

## 2022-03-12 DIAGNOSIS — M4125 Other idiopathic scoliosis, thoracolumbar region: Secondary | ICD-10-CM

## 2022-03-12 DIAGNOSIS — Z96611 Presence of right artificial shoulder joint: Secondary | ICD-10-CM

## 2022-03-12 MED ORDER — TRIAMCINOLONE ACETONIDE 40 MG/ML IJ SUSP
40.0000 mg | INTRAMUSCULAR | Status: AC | PRN
  Administered 2022-03-12: 40 mg via INTRA_ARTICULAR

## 2022-03-12 MED ORDER — LIDOCAINE HCL 1 % IJ SOLN
1.5000 mL | INTRAMUSCULAR | Status: AC | PRN
  Administered 2022-03-12: 1.5 mL

## 2022-04-09 ENCOUNTER — Ambulatory Visit: Payer: Medicare Other | Attending: Rheumatology | Admitting: Physical Therapy

## 2022-04-09 ENCOUNTER — Encounter: Payer: Self-pay | Admitting: Physical Therapy

## 2022-04-09 ENCOUNTER — Other Ambulatory Visit: Payer: Self-pay

## 2022-04-09 DIAGNOSIS — G8929 Other chronic pain: Secondary | ICD-10-CM | POA: Insufficient documentation

## 2022-04-09 DIAGNOSIS — M25562 Pain in left knee: Secondary | ICD-10-CM | POA: Diagnosis present

## 2022-04-09 DIAGNOSIS — M545 Low back pain, unspecified: Secondary | ICD-10-CM | POA: Insufficient documentation

## 2022-04-09 DIAGNOSIS — M6281 Muscle weakness (generalized): Secondary | ICD-10-CM | POA: Diagnosis present

## 2022-04-09 DIAGNOSIS — M25561 Pain in right knee: Secondary | ICD-10-CM | POA: Diagnosis present

## 2022-04-09 DIAGNOSIS — M170 Bilateral primary osteoarthritis of knee: Secondary | ICD-10-CM | POA: Diagnosis not present

## 2022-04-09 DIAGNOSIS — R2681 Unsteadiness on feet: Secondary | ICD-10-CM | POA: Insufficient documentation

## 2022-04-09 DIAGNOSIS — R293 Abnormal posture: Secondary | ICD-10-CM | POA: Diagnosis present

## 2022-04-09 DIAGNOSIS — M5136 Other intervertebral disc degeneration, lumbar region: Secondary | ICD-10-CM | POA: Diagnosis not present

## 2022-04-09 DIAGNOSIS — R262 Difficulty in walking, not elsewhere classified: Secondary | ICD-10-CM | POA: Insufficient documentation

## 2022-04-09 DIAGNOSIS — R2689 Other abnormalities of gait and mobility: Secondary | ICD-10-CM | POA: Diagnosis not present

## 2022-04-09 NOTE — Therapy (Signed)
OUTPATIENT PHYSICAL THERAPY LOWER EXTREMITY EVALUATION   Patient Name: Danielle Dean MRN: [id number] DOB:04-06-1929, 88 y.o., female Today's Date: 04/09/2022   PT End of Session - 04/09/22 2037     Visit Number 1    Number of Visits 24    Date for PT Re-Evaluation 07/02/22    Authorization Type MCR and MCD    Authorization Time Period 04/09/22 to 07/02/22    Progress Note Due on Visit 10   KX at visit 9   PT Start Time 0930    PT Stop Time 1016    PT Time Calculation (min) 46 min    Activity Tolerance Patient tolerated treatment well;No increased pain    Behavior During Therapy WFL for tasks assessed/performed             Past Medical History:  Diagnosis Date   Arthritis    Callus    Cataract fragments in both eyes following surgery    Corns and callosities    Hx of hysterectomy    Hypertension    Osteoarthritis    Past Surgical History:  Procedure Laterality Date   ABDOMINAL HYSTERECTOMY     NASAL SEPTUM SURGERY     ROTATOR CUFF REPAIR Right    TOTAL SHOULDER ARTHROPLASTY     Patient Active Problem List   Diagnosis Date Noted   DDD (degenerative disc disease), thoracic 11/06/2020   Other idiopathic scoliosis, thoracolumbar region 11/06/2020   DDD (degenerative disc disease), lumbar 11/06/2020   Age-related osteoporosis without current pathological fracture 11/06/2020   Porokeratosis 11/26/2018   Corns and callosities 11/26/2018   History of right shoulder replacement 10/07/2016   Chronic pain of both knees 05/20/2016   Osteoarthritis of both hands 05/20/2016   Essential hypertension 05/20/2016   Environmental allergies 05/20/2016   Primary osteoarthritis of both knees 05/19/2016   Callus of foot 11/24/2012   Pain in joint, ankle and foot 09/15/2012    PCP: Georgann Housekeeper, MD  REFERRING PROVIDER: Pollyann Savoy, MD  REFERRING DIAG: Primary OA Bil knees, DDD lumbar, poor balance  THERAPY DIAG:  Chronic pain of left knee  Chronic pain of right  knee  Muscle weakness (generalized)  Difficulty in walking, not elsewhere classified  Acute bilateral low back pain without sciatica  Abnormal posture  Unsteadiness on feet  Rationale for Evaluation and Treatment Rehabilitation  ONSET DATE: chronic issues-several years   SUBJECTIVE:   SUBJECTIVE STATEMENT: Pt states that she has arthritis in her knees and low back. She has had pain for years. She also feels unsteady when she walks. Uses rollator in the community but no AD in the home. Pt has had PT before for this and her shoulder pain.  PERTINENT HISTORY: Rt shoulder replacement.  DDD Lumbar/cervical spine. scoliosis PAIN:  Are you having pain? Yes: NPRS scale: 4/10 Pain location: anterior knee pain Pain description: aching Aggravating factors: prolonged standing, walking; low back pain with standing activity Relieving factors: sitting, wearing knee braces  PRECAUTIONS: Fall  WEIGHT BEARING RESTRICTIONS: No  FALLS:  Has patient fallen in last 6 months? No  LIVING ENVIRONMENT: Lives with: lives with their family and lives alone Lives in: House/apartment Stairs: doesn't need to use steps in her home Has following equipment at home:  rollator , [facility]  OCCUPATION: retired   PLOF: Independent with basic ADLs  PATIENT GOALS: decrease pain in knees/back; improve steadiness    OBJECTIVE:   DIAGNOSTIC FINDINGS:   PATIENT SURVEYS:  FOTO: next visit  COGNITION: Overall cognitive  status: Within functional limits for tasks assessed     SENSATION: Not tested denies NT  EDEMA:  None noted at eval, will sometimes have swelling in Lt ankle   MUSCLE LENGTH: Hamstrings: Right 90/90 45 deg ; Left 90/90 45 deg   POSTURE: decreased lumbar lordosis and weight shift left trunk lateral shift Lt   PALPATION: Tenderness Rt glute max  LOWER EXTREMITY ROM:  Passive ROM Right eval Left eval  Hip flexion    Hip extension    Hip abduction    Hip adduction    Hip  internal rotation    Hip external rotation    Knee flexion WNL WNL  Knee extension WNL WNL  Ankle dorsiflexion    Ankle plantarflexion    Ankle inversion    Ankle eversion     (Blank rows = not tested)   LOWER EXTREMITY MMT:  MMT Right eval Left eval  Hip flexion 3+/pain 4  Hip extension 3 3  Hip abduction 3 3  Hip adduction    Hip internal rotation    Hip external rotation    Knee flexion 3+ 3+  Knee extension 3+ 3+/pain  Ankle dorsiflexion 4 4  Ankle plantarflexion    Ankle inversion    Ankle eversion     (Blank rows = not tested) LUMBAR ROM:   AROM eval  Flexion WNL  Extension Unable to reach neutral  Right lateral flexion   Left lateral flexion   Right rotation 75% limited  Left rotation 75% limited   (Blank rows = not tested)  ] LOWER EXTREMITY SPECIAL TESTS:    FUNCTIONAL TESTS:  5 times sit to stand: 23 sec, weight shift Lt, UE assist  GAIT: Distance walked: 20 Assistive device utilized: None Level of assistance: Complete Independence Comments: minimal hip extension Lt, Lt lateral trunk shift, increased weight through Lt LE   TODAY'S TREATMENT                                                                          DATE:04/09/22 Sit to stand with alternating LE adjusted for comfort Semirecumbent low trunk rotation HEP demo Seated thoracic rotation x5 reps HEP demo  PATIENT EDUCATION:  Education details: mechanics sit to stand,  Person educated: Patient and Child(ren) Education method: Explanation, Facilities manager, and Handouts Education comprehension: verbalized understanding and returned demonstration  HOME EXERCISE PROGRAM: Access Code: [email or web address] URL: [email or web address] Date: 04/09/2022 Prepared by: [facility] - [facility] - [facility]  Exercises - Supine Lower Trunk Rotation  - 3 x daily - 7 x weekly - 1 sets - 10 reps - Seated Thoracic Flexion and Rotation with Arms Crossed  - 3 x daily - 7 x weekly  - 1 sets - 10 reps  ASSESSMENT:  CLINICAL IMPRESSION: Patient is a 88 y.o. F who was seen today for physical therapy evaluation and treatment for chronic knee pain, chronic low back pain and unsteadiness with daily activity.    OBJECTIVE IMPAIRMENTS: Abnormal gait, decreased activity tolerance, decreased balance, decreased knowledge of use of DME, decreased mobility, difficulty walking, decreased ROM, decreased strength, increased edema, impaired flexibility, improper body mechanics, postural dysfunction, and pain.   ACTIVITY LIMITATIONS: bending, standing, squatting, sleeping, stairs, and locomotion  level  PARTICIPATION LIMITATIONS: cleaning, driving, community activity, and yard work  PERSONAL FACTORS: Age, Past/current experiences, Time since onset of injury/illness/exacerbation, and 3+ comorbidities: knee OA, lumbar DDD and scoliosis  are also affecting patient's functional outcome.   REHAB POTENTIAL: Good  CLINICAL DECISION MAKING: Evolving/moderate complexity  EVALUATION COMPLEXITY: High   GOALS: Goals reviewed with patient? Yes  SHORT TERM GOALS: Target date: 04/23/2022  Pt will be independent with her initial HEP to improve LE strength and pain. Baseline: Goal status: INITIAL  2.  Pt will be able to demonstrate proper body mechanics with bed mobility to decrease strain on her low back. Baseline:  Goal status: INITIAL     LONG TERM GOALS: Target date: 07/02/2022   Pt will be able to complete 5x sit to stand in less than 15 sec with or without UE support, to reflect improvements in LE mobility and strength. Baseline: 23sec Goal status: INITIAL  2.  Pt will report atleast 40% improvement in her knee and low back pain from the start of PT. Baseline:  Goal status: INITIAL  3.  Pt will be able to ambulate with LRAD outdoors x226ft without assistance, to reflect improvement in her steadiness with ambulation. Baseline:  Goal status: INITIAL  4.  Pt will be able to  maintain SLS for up to 2 sec each LE, 2/3 trials. Baseline:  Goal status: INITIAL  5.   Baseline:  Goal status: INITIAL  6.   Baseline:  Goal status: INITIAL   PLAN:  PT FREQUENCY: 2x/week  PT DURATION: 12 weeks  PLANNED INTERVENTIONS: Therapeutic exercises, Therapeutic activity, Neuromuscular re-education, Balance training, Gait training, Patient/Family education, Self Care, Joint mobilization, Aquatic Therapy, Electrical stimulation, Cryotherapy, Manual therapy, and Re-evaluation  PLAN FOR NEXT SESSION: need FOTO; BERG-set goal; progress LE strength, lumbar/thoracic flexibility and balance activity    9:05 PM,04/09/22 Sherol Dade PT, [facility] [place]

## 2022-04-10 ENCOUNTER — Encounter: Payer: Self-pay | Admitting: Podiatry

## 2022-04-10 ENCOUNTER — Ambulatory Visit (INDEPENDENT_AMBULATORY_CARE_PROVIDER_SITE_OTHER): Payer: Medicare Other | Admitting: Podiatry

## 2022-04-10 DIAGNOSIS — Q828 Other specified congenital malformations of skin: Secondary | ICD-10-CM

## 2022-04-14 NOTE — Progress Notes (Signed)
Subjective:   Patient ID: Danielle Dean, female   DOB: 88 y.o.   MRN: [id number]   HPI Patient presents with caregiver with chronic lesions on both feet that become painful   ROS      Objective:  Physical Exam  Neurovascular status intact with painful lesions noted bilateral     Assessment:  Chronic lesion formation bilateral fifth digit fifth metatarsal     Plan:  Debridement accomplished there was slight bleeding on the 1 on the right and I did apply sterile dressing with Neosporin and instructed on wearing this for the next few days.  Reappoint as symptoms indicate

## 2022-04-17 ENCOUNTER — Ambulatory Visit: Payer: Medicare Other

## 2022-04-24 ENCOUNTER — Ambulatory Visit: Payer: Medicare Other | Attending: Rheumatology | Admitting: Physical Therapy

## 2022-04-24 DIAGNOSIS — R262 Difficulty in walking, not elsewhere classified: Secondary | ICD-10-CM

## 2022-04-24 DIAGNOSIS — R2681 Unsteadiness on feet: Secondary | ICD-10-CM | POA: Diagnosis present

## 2022-04-24 DIAGNOSIS — R293 Abnormal posture: Secondary | ICD-10-CM | POA: Insufficient documentation

## 2022-04-24 DIAGNOSIS — G8929 Other chronic pain: Secondary | ICD-10-CM

## 2022-04-24 DIAGNOSIS — M25562 Pain in left knee: Secondary | ICD-10-CM | POA: Diagnosis present

## 2022-04-24 DIAGNOSIS — M25561 Pain in right knee: Secondary | ICD-10-CM | POA: Insufficient documentation

## 2022-04-24 DIAGNOSIS — M545 Low back pain, unspecified: Secondary | ICD-10-CM

## 2022-04-24 DIAGNOSIS — M6281 Muscle weakness (generalized): Secondary | ICD-10-CM | POA: Diagnosis present

## 2022-04-24 DIAGNOSIS — R2689 Other abnormalities of gait and mobility: Secondary | ICD-10-CM | POA: Insufficient documentation

## 2022-04-24 NOTE — Therapy (Signed)
OUTPATIENT PHYSICAL THERAPY LOWER EXTREMITY EVALUATION   Patient Name: Danielle Dean MRN: [id number] DOB:1929/05/01, 88 y.o., female Today's Date: 04/24/2022   PT End of Session - 04/24/22 0936     Visit Number 2    Number of Visits 24    Date for PT Re-Evaluation 07/02/22    Authorization Type MCR and MCD    Authorization Time Period 04/09/22 to 07/02/22    PT Start Time 0930    PT Stop Time 1015    PT Time Calculation (min) 45 min             Past Medical History:  Diagnosis Date   Arthritis    Callus    Cataract fragments in both eyes following surgery    Corns and callosities    Hx of hysterectomy    Hypertension    Osteoarthritis    Past Surgical History:  Procedure Laterality Date   ABDOMINAL HYSTERECTOMY     NASAL SEPTUM SURGERY     ROTATOR CUFF REPAIR Right    TOTAL SHOULDER ARTHROPLASTY     Patient Active Problem List   Diagnosis Date Noted   DDD (degenerative disc disease), thoracic 11/06/2020   Other idiopathic scoliosis, thoracolumbar region 11/06/2020   DDD (degenerative disc disease), lumbar 11/06/2020   Age-related osteoporosis without current pathological fracture 11/06/2020   Porokeratosis 11/26/2018   Corns and callosities 11/26/2018   History of right shoulder replacement 10/07/2016   Chronic pain of both knees 05/20/2016   Osteoarthritis of both hands 05/20/2016   Essential hypertension 05/20/2016   Environmental allergies 05/20/2016   Primary osteoarthritis of both knees 05/19/2016   Callus of foot 11/24/2012   Pain in joint, ankle and foot 09/15/2012    PCP: Georgann Housekeeper, MD  REFERRING PROVIDER: Pollyann Savoy, MD  REFERRING DIAG: Primary OA Bil knees, DDD lumbar, poor balance  THERAPY DIAG:  Chronic pain of left knee  Chronic pain of right knee  Muscle weakness (generalized)  Difficulty in walking, not elsewhere classified  Acute bilateral low back pain without sciatica  Rationale for Evaluation and Treatment  Rehabilitation  ONSET DATE: chronic issues-several years   SUBJECTIVE:   SUBJECTIVE STATEMENT: Pt arrived with chief c/o knees. Amb in with [facility] and asked if it was okay to use PERTINENT HISTORY: Rt shoulder replacement.  DDD Lumbar/cervical spine. scoliosis PAIN:  Are you having pain? Yes: NPRS scale: 4/10 Pain location: anterior knee pain Pain description: aching Aggravating factors: prolonged standing, walking; low back pain with standing activity Relieving factors: sitting, wearing knee braces  PRECAUTIONS: Fall  WEIGHT BEARING RESTRICTIONS: No  FALLS:  Has patient fallen in last 6 months? No  LIVING ENVIRONMENT: Lives with: lives with their family and lives alone Lives in: House/apartment Stairs: doesn't need to use steps in her home Has following equipment at home:  rollator , [facility]  OCCUPATION: retired   PLOF: Independent with basic ADLs  PATIENT GOALS: decrease pain in knees/back; improve steadiness    OBJECTIVE:   DIAGNOSTIC FINDINGS:   PATIENT SURVEYS:  FOTO: next visit  COGNITION: Overall cognitive status: Within functional limits for tasks assessed     SENSATION: Not tested denies NT  EDEMA:  None noted at eval, will sometimes have swelling in Lt ankle   MUSCLE LENGTH: Hamstrings: Right 90/90 45 deg ; Left 90/90 45 deg   POSTURE: decreased lumbar lordosis and weight shift left trunk lateral shift Lt   PALPATION: Tenderness Rt glute max  LOWER EXTREMITY ROM:  Passive ROM  Right eval Left eval  Hip flexion    Hip extension    Hip abduction    Hip adduction    Hip internal rotation    Hip external rotation    Knee flexion WNL WNL  Knee extension WNL WNL  Ankle dorsiflexion    Ankle plantarflexion    Ankle inversion    Ankle eversion     (Blank rows = not tested)   LOWER EXTREMITY MMT:  MMT Right eval Left eval  Hip flexion 3+/pain 4  Hip extension 3 3  Hip abduction 3 3  Hip adduction    Hip internal rotation    Hip  external rotation    Knee flexion 3+ 3+  Knee extension 3+ 3+/pain  Ankle dorsiflexion 4 4  Ankle plantarflexion    Ankle inversion    Ankle eversion     (Blank rows = not tested) LUMBAR ROM:   AROM eval  Flexion WNL  Extension Unable to reach neutral  Right lateral flexion   Left lateral flexion   Right rotation 75% limited  Left rotation 75% limited   (Blank rows = not tested)  ] LOWER EXTREMITY SPECIAL TESTS:    FUNCTIONAL TESTS:  5 times sit to stand: 23 sec, weight shift Lt, UE assist  GAIT: Distance walked: 20 Assistive device utilized: None Level of assistance: Complete Independence Comments: minimal hip extension Lt, Lt lateral trunk shift, increased weight through Lt LE   TODAY'S TREATMENT                                        04/24/22 2# seated LAQ, hip flex and abd 2 sets 10 Red tband trunk ext 2 sets 10 Red tband seated row and shld ext 2 sets 10 Nustep L 4 6 min UE and LE UBE L 2 2 min fwd / 2 back Lat pull 15 # 2 sets 10, help with the pull down ,focus on [place] and UE stretch Seated row 10 # 10 x Standing on airex marching 20 x 2 sets- CGA In // bars on airex with UE hip 3 way 10 x each Foam beam tandem fwd/back and laterally                                         DATE:04/09/22 Sit to stand with alternating LE adjusted for comfort Semirecumbent low trunk rotation HEP demo Seated thoracic rotation x5 reps HEP demo  PATIENT EDUCATION:  Education details: mechanics sit to stand,  Person educated: Patient and Child(ren) Education method: Explanation, Facilities manager, and Handouts Education comprehension: verbalized understanding and returned demonstration  HOME EXERCISE PROGRAM: Access Code: [email or web address] URL: [email or web address] Date: 04/09/2022 Prepared by: [facility] - Outpatient Rehab - [facility]  Exercises - Supine Lower Trunk Rotation  - 3 x daily - 7 x weekly - 1 sets - 10 reps - Seated Thoracic Flexion and  Rotation with Arms Crossed  - 3 x daily - 7 x weekly - 1 sets - 10 reps  ASSESSMENT:  CLINICAL IMPRESSION: pt asked about use of SPC - okayed for in home only -during day not night, but rec 4WW as it is much safer and helps unload joints some. Progressed initial there ex and did well but did need cuing for posture and speed of ex  OBJECTIVE IMPAIRMENTS: Abnormal gait, decreased activity tolerance, decreased balance, decreased knowledge of use of DME, decreased mobility, difficulty walking, decreased ROM, decreased strength, increased edema, impaired flexibility, improper body mechanics, postural dysfunction, and pain.   ACTIVITY LIMITATIONS: bending, standing, squatting, sleeping, stairs, and locomotion level  PARTICIPATION LIMITATIONS: cleaning, driving, community activity, and yard work  PERSONAL FACTORS: Age, Past/current experiences, Time since onset of injury/illness/exacerbation, and 3+ comorbidities: knee OA, lumbar DDD and scoliosis  are also affecting patient's functional outcome.   REHAB POTENTIAL: Good  CLINICAL DECISION MAKING: Evolving/moderate complexity  EVALUATION COMPLEXITY: High   GOALS: Goals reviewed with patient? Yes  SHORT TERM GOALS: Target date: 05/08/2022  Pt will be independent with her initial HEP to improve LE strength and pain. Baseline: Goal status: INITIAL  2.  Pt will be able to demonstrate proper body mechanics with bed mobility to decrease strain on her low back. Baseline:  Goal status: INITIAL     LONG TERM GOALS: Target date: 07/17/2022   Pt will be able to complete 5x sit to stand in less than 15 sec with or without UE support, to reflect improvements in LE mobility and strength. Baseline: 23sec Goal status: INITIAL  2.  Pt will report atleast 40% improvement in her knee and low back pain from the start of PT. Baseline:  Goal status: INITIAL  3.  Pt will be able to ambulate with LRAD outdoors x252ft without assistance, to reflect  improvement in her steadiness with ambulation. Baseline:  Goal status: INITIAL  4.  Pt will be able to maintain SLS for up to 2 sec each LE, 2/3 trials. Baseline:  Goal status: INITIAL  5.   Baseline:  Goal status: INITIAL  6.   Baseline:  Goal status: INITIAL   PLAN:  PT FREQUENCY: 2x/week  PT DURATION: 12 weeks  PLANNED INTERVENTIONS: Therapeutic exercises, Therapeutic activity, Neuromuscular re-education, Balance training, Gait training, Patient/Family education, Self Care, Joint mobilization, Aquatic Therapy, Electrical stimulation, Cryotherapy, Manual therapy, and Re-evaluation  PLAN FOR NEXT SESSION: need FOTO; BERG-set goal; progress LE strength, lumbar/thoracic flexibility and balance activity    9:37 AM,04/24/22 Donita Brooks PT, DPT [facility] [place]  [phone] [facility] [place] [facility] [address] [place], [name], [address] Phone: [phone]   Fax:  [phone]  Patient Details  Name: BRITNIE COLVILLE MRN: [id number] Date of Birth: [birth year] Referring Provider:  Georgann Housekeeper, MD  Encounter Date: 04/24/2022   Suanne Marker, PTA 04/24/2022, 9:37 AM  [facility] [address] [place], [name], [address] Phone: [phone]   Fax:  [phone]

## 2022-04-28 NOTE — Therapy (Signed)
OUTPATIENT PHYSICAL THERAPY LOWER EXTREMITY TREATMENT   Patient Name: Danielle Dean MRN: [id number] DOB:[birth year], 88 y.o., female Today's Date: 04/24/2022   PT End of Session - 04/24/22 0936     Visit Number 2    Number of Visits 24    Date for PT Re-Evaluation 07/02/22    Authorization Type MCR and MCD    Authorization Time Period 04/09/22 to 07/02/22    PT Start Time 0930    PT Stop Time 1015    PT Time Calculation (min) 45 min             Past Medical History:  Diagnosis Date   Arthritis    Callus    Cataract fragments in both eyes following surgery    Corns and callosities    Hx of hysterectomy    Hypertension    Osteoarthritis    Past Surgical History:  Procedure Laterality Date   ABDOMINAL HYSTERECTOMY     NASAL SEPTUM SURGERY     ROTATOR CUFF REPAIR Right    TOTAL SHOULDER ARTHROPLASTY     Patient Active Problem List   Diagnosis Date Noted   DDD (degenerative disc disease), thoracic 11/06/2020   Other idiopathic scoliosis, thoracolumbar region 11/06/2020   DDD (degenerative disc disease), lumbar 11/06/2020   Age-related osteoporosis without current pathological fracture 11/06/2020   Porokeratosis 11/26/2018   Corns and callosities 11/26/2018   History of right shoulder replacement 10/07/2016   Chronic pain of both knees 05/20/2016   Osteoarthritis of both hands 05/20/2016   Essential hypertension 05/20/2016   Environmental allergies 05/20/2016   Primary osteoarthritis of both knees 05/19/2016   Callus of foot 11/24/2012   Pain in joint, ankle and foot 09/15/2012    PCP: Georgann Housekeeper, MD  REFERRING PROVIDER: Pollyann Savoy, MD  REFERRING DIAG: Primary OA Bil knees, DDD lumbar, poor balance  THERAPY DIAG:  Chronic pain of left knee  Chronic pain of right knee  Muscle weakness (generalized)  Difficulty in walking, not elsewhere classified  Acute bilateral low back pain without sciatica  Rationale for Evaluation and Treatment  Rehabilitation  ONSET DATE: chronic issues-several years   SUBJECTIVE:   SUBJECTIVE STATEMENT: Pt arrived with chief c/o knees. Amb in with [facility] and asked if it was okay to use  PERTINENT HISTORY: Rt shoulder replacement.  DDD Lumbar/cervical spine. scoliosis PAIN:  Are you having pain? Yes: NPRS scale: 4/10 Pain location: anterior knee pain Pain description: aching Aggravating factors: prolonged standing, walking; low back pain with standing activity Relieving factors: sitting, wearing knee braces  PRECAUTIONS: Fall  WEIGHT BEARING RESTRICTIONS: No  FALLS:  Has patient fallen in last 6 months? No  LIVING ENVIRONMENT: Lives with: lives with their family and lives alone Lives in: House/apartment Stairs: doesn't need to use steps in her home Has following equipment at home:  rollator , [facility]  OCCUPATION: retired   PLOF: Independent with basic ADLs  PATIENT GOALS: decrease pain in knees/back; improve steadiness    OBJECTIVE:   DIAGNOSTIC FINDINGS:   PATIENT SURVEYS:  FOTO: next visit  COGNITION: Overall cognitive status: Within functional limits for tasks assessed     SENSATION: Not tested denies NT  EDEMA:  None noted at eval, will sometimes have swelling in Lt ankle   MUSCLE LENGTH: Hamstrings: Right 90/90 45 deg ; Left 90/90 45 deg   POSTURE: decreased lumbar lordosis and weight shift left trunk lateral shift Lt   PALPATION: Tenderness Rt glute max  LOWER EXTREMITY ROM:  Passive  ROM Right eval Left eval  Hip flexion    Hip extension    Hip abduction    Hip adduction    Hip internal rotation    Hip external rotation    Knee flexion WNL WNL  Knee extension WNL WNL  Ankle dorsiflexion    Ankle plantarflexion    Ankle inversion    Ankle eversion     (Blank rows = not tested)   LOWER EXTREMITY MMT:  MMT Right eval Left eval  Hip flexion 3+/pain 4  Hip extension 3 3  Hip abduction 3 3  Hip adduction    Hip internal rotation    Hip  external rotation    Knee flexion 3+ 3+  Knee extension 3+ 3+/pain  Ankle dorsiflexion 4 4  Ankle plantarflexion    Ankle inversion    Ankle eversion     (Blank rows = not tested) LUMBAR ROM:   AROM eval  Flexion WNL  Extension Unable to reach neutral  Right lateral flexion   Left lateral flexion   Right rotation 75% limited  Left rotation 75% limited   (Blank rows = not tested)  ] LOWER EXTREMITY SPECIAL TESTS:    FUNCTIONAL TESTS:  5 times sit to stand: 23 sec, weight shift Lt, UE assist  GAIT: Distance walked: 20 Assistive device utilized: None Level of assistance: Complete Independence Comments: minimal hip extension Lt, Lt lateral trunk shift, increased weight through Lt LE   TODAY'S TREATMENT                                       04/29/22 NuStep STS LAQ HS curls Ball squeezes Step ups  Marching with 2#     04/24/22 2# seated LAQ, hip flex and abd 2 sets 10 Red tband trunk ext 2 sets 10 Red tband seated row and shld ext 2 sets 10 Nustep L 4 6 min UE and LE UBE L 2 2 min fwd / 2 back Lat pull 15 # 2 sets 10, help with the pull down ,focus on [place] and UE stretch Seated row 10 # 10 x Standing on airex marching 20 x 2 sets- CGA In // bars on airex with UE hip 3 way 10 x each Foam beam tandem fwd/back and laterally   04/09/22 Sit to stand with alternating LE adjusted for comfort Semirecumbent low trunk rotation HEP demo Seated thoracic rotation x5 reps HEP demo  PATIENT EDUCATION:  Education details: mechanics sit to stand,  Person educated: Patient and Child(ren) Education method: Explanation, Facilities manager, and Handouts Education comprehension: verbalized understanding and returned demonstration  HOME EXERCISE PROGRAM: Access Code: [email or web address] URL: [email or web address] Date: 04/09/2022 Prepared by: [facility] - Outpatient Rehab - [facility]  Exercises - Supine Lower Trunk Rotation  - 3 x daily - 7 x weekly - 1 sets  - 10 reps - Seated Thoracic Flexion and Rotation with Arms Crossed  - 3 x daily - 7 x weekly - 1 sets - 10 reps  ASSESSMENT:  CLINICAL IMPRESSION: pt asked about use of SPC - okayed for in home only -during day not night, but rec 4WW as it is much safer and helps unload joints some. Progressed initial there ex and did well but did need cuing for posture and speed of ex  OBJECTIVE IMPAIRMENTS: Abnormal gait, decreased activity tolerance, decreased balance, decreased knowledge of use of DME, decreased mobility, difficulty walking,  decreased ROM, decreased strength, increased edema, impaired flexibility, improper body mechanics, postural dysfunction, and pain.   ACTIVITY LIMITATIONS: bending, standing, squatting, sleeping, stairs, and locomotion level  PARTICIPATION LIMITATIONS: cleaning, driving, community activity, and yard work  PERSONAL FACTORS: Age, Past/current experiences, Time since onset of injury/illness/exacerbation, and 3+ comorbidities: knee OA, lumbar DDD and scoliosis  are also affecting patient's functional outcome.   REHAB POTENTIAL: Good  CLINICAL DECISION MAKING: Evolving/moderate complexity  EVALUATION COMPLEXITY: High   GOALS: Goals reviewed with patient? Yes  SHORT TERM GOALS: Target date: 05/08/2022  Pt will be independent with her initial HEP to improve LE strength and pain. Baseline: Goal status: INITIAL  2.  Pt will be able to demonstrate proper body mechanics with bed mobility to decrease strain on her low back. Baseline:  Goal status: INITIAL     LONG TERM GOALS: Target date: 07/17/2022   Pt will be able to complete 5x sit to stand in less than 15 sec with or without UE support, to reflect improvements in LE mobility and strength. Baseline: 23sec Goal status: INITIAL  2.  Pt will report atleast 40% improvement in her knee and low back pain from the start of PT. Baseline:  Goal status: INITIAL  3.  Pt will be able to ambulate with LRAD outdoors  x265ft without assistance, to reflect improvement in her steadiness with ambulation. Baseline:  Goal status: INITIAL  4.  Pt will be able to maintain SLS for up to 2 sec each LE, 2/3 trials. Baseline:  Goal status: INITIAL  5.   Baseline:  Goal status: INITIAL  6.   Baseline:  Goal status: INITIAL   PLAN:  PT FREQUENCY: 2x/week  PT DURATION: 12 weeks  PLANNED INTERVENTIONS: Therapeutic exercises, Therapeutic activity, Neuromuscular re-education, Balance training, Gait training, Patient/Family education, Self Care, Joint mobilization, Aquatic Therapy, Electrical stimulation, Cryotherapy, Manual therapy, and Re-evaluation  PLAN FOR NEXT SESSION: need FOTO; BERG-set goal; progress LE strength, lumbar/thoracic flexibility and balance activity    9:37 AM,04/24/22 Donita Brooks PT, DPT [facility] [place]  [phone] [facility] [place] [facility] [address] [place], [name], [address] Phone: [phone]   Fax:  [phone]  Patient Details  Name: Danielle Dean MRN: [id number] Date of Birth: [birth year] Referring Provider:  Georgann Housekeeper, MD  Encounter Date: 04/24/2022   Suanne Marker, PTA 04/24/2022, 9:37 AM  [facility] [address] [place], [name], [address] Phone: [phone]   Fax:  [phone]

## 2022-04-29 ENCOUNTER — Ambulatory Visit: Payer: Medicare Other

## 2022-04-29 DIAGNOSIS — G8929 Other chronic pain: Secondary | ICD-10-CM

## 2022-04-29 DIAGNOSIS — R2681 Unsteadiness on feet: Secondary | ICD-10-CM

## 2022-04-29 DIAGNOSIS — M6281 Muscle weakness (generalized): Secondary | ICD-10-CM

## 2022-04-29 DIAGNOSIS — R262 Difficulty in walking, not elsewhere classified: Secondary | ICD-10-CM

## 2022-04-29 DIAGNOSIS — R2689 Other abnormalities of gait and mobility: Secondary | ICD-10-CM

## 2022-04-29 DIAGNOSIS — M25562 Pain in left knee: Secondary | ICD-10-CM | POA: Diagnosis not present

## 2022-05-01 ENCOUNTER — Encounter: Payer: Self-pay | Admitting: Physical Therapy

## 2022-05-01 ENCOUNTER — Ambulatory Visit: Payer: Medicare Other | Admitting: Physical Therapy

## 2022-05-01 DIAGNOSIS — R262 Difficulty in walking, not elsewhere classified: Secondary | ICD-10-CM

## 2022-05-01 DIAGNOSIS — G8929 Other chronic pain: Secondary | ICD-10-CM

## 2022-05-01 DIAGNOSIS — M25562 Pain in left knee: Secondary | ICD-10-CM | POA: Diagnosis not present

## 2022-05-01 DIAGNOSIS — M6281 Muscle weakness (generalized): Secondary | ICD-10-CM

## 2022-05-01 NOTE — Therapy (Signed)
OUTPATIENT PHYSICAL THERAPY LOWER EXTREMITY TREATMENT   Patient Name: Danielle Dean MRN: [id number] DOB:April 03, 1929, 88 y.o., female Today's Date: 05/01/2022   PT End of Session - 05/01/22 1017     Visit Number 4    Date for PT Re-Evaluation 07/02/22    PT Start Time 1015    PT Stop Time 1100    PT Time Calculation (min) 45 min    Activity Tolerance Patient tolerated treatment well    Behavior During Therapy WFL for tasks assessed/performed             Past Medical History:  Diagnosis Date   Arthritis    Callus    Cataract fragments in both eyes following surgery    Corns and callosities    Hx of hysterectomy    Hypertension    Osteoarthritis    Past Surgical History:  Procedure Laterality Date   ABDOMINAL HYSTERECTOMY     NASAL SEPTUM SURGERY     ROTATOR CUFF REPAIR Right    TOTAL SHOULDER ARTHROPLASTY     Patient Active Problem List   Diagnosis Date Noted   DDD (degenerative disc disease), thoracic 11/06/2020   Other idiopathic scoliosis, thoracolumbar region 11/06/2020   DDD (degenerative disc disease), lumbar 11/06/2020   Age-related osteoporosis without current pathological fracture 11/06/2020   Porokeratosis 11/26/2018   Corns and callosities 11/26/2018   History of right shoulder replacement 10/07/2016   Chronic pain of both knees 05/20/2016   Osteoarthritis of both hands 05/20/2016   Essential hypertension 05/20/2016   Environmental allergies 05/20/2016   Primary osteoarthritis of both knees 05/19/2016   Callus of foot 11/24/2012   Pain in joint, ankle and foot 09/15/2012    PCP: Georgann Housekeeper, MD  REFERRING PROVIDER: Pollyann Savoy, MD  REFERRING DIAG: Primary OA Bil knees, DDD lumbar, poor balance  THERAPY DIAG:  Chronic pain of left knee  Chronic pain of right knee  Muscle weakness (generalized)  Difficulty in walking, not elsewhere classified  Rationale for Evaluation and Treatment Rehabilitation  ONSET DATE: chronic  issues-several years   SUBJECTIVE:   SUBJECTIVE STATEMENT: Knees are hurting but I'm managing   PERTINENT HISTORY: Rt shoulder replacement.  DDD Lumbar/cervical spine. scoliosis PAIN:  Are you having pain? Yes: NPRS scale: 4/10 Pain location: anterior knee pain Pain description: aching Aggravating factors: prolonged standing, walking; low back pain with standing activity Relieving factors: sitting, wearing knee braces  PRECAUTIONS: Fall  WEIGHT BEARING RESTRICTIONS: No  FALLS:  Has patient fallen in last 6 months? No  LIVING ENVIRONMENT: Lives with: lives with their family and lives alone Lives in: House/apartment Stairs: doesn't need to use steps in her home Has following equipment at home:  rollator , [facility]  OCCUPATION: retired   PLOF: Independent with basic ADLs  PATIENT GOALS: decrease pain in knees/back; improve steadiness    OBJECTIVE:   DIAGNOSTIC FINDINGS:   PATIENT SURVEYS:  FOTO: next visit  COGNITION: Overall cognitive status: Within functional limits for tasks assessed     SENSATION: Not tested denies NT  EDEMA:  None noted at eval, will sometimes have swelling in Lt ankle   MUSCLE LENGTH: Hamstrings: Right 90/90 45 deg ; Left 90/90 45 deg   POSTURE: decreased lumbar lordosis and weight shift left trunk lateral shift Lt   PALPATION: Tenderness Rt glute max  LOWER EXTREMITY ROM:  Passive ROM Right eval Left eval  Hip flexion    Hip extension    Hip abduction    Hip adduction  Hip internal rotation    Hip external rotation    Knee flexion WNL WNL  Knee extension WNL WNL  Ankle dorsiflexion    Ankle plantarflexion    Ankle inversion    Ankle eversion     (Blank rows = not tested)   LOWER EXTREMITY MMT:  MMT Right eval Left eval  Hip flexion 3+/pain 4  Hip extension 3 3  Hip abduction 3 3  Hip adduction    Hip internal rotation    Hip external rotation    Knee flexion 3+ 3+  Knee extension 3+ 3+/pain  Ankle  dorsiflexion 4 4  Ankle plantarflexion    Ankle inversion    Ankle eversion     (Blank rows = not tested) LUMBAR ROM:   AROM eval  Flexion WNL  Extension Unable to reach neutral  Right lateral flexion   Left lateral flexion   Right rotation 75% limited  Left rotation 75% limited   (Blank rows = not tested)  ] LOWER EXTREMITY SPECIAL TESTS:    FUNCTIONAL TESTS:  5 times sit to stand: 23 sec, weight shift Lt, UE assist  GAIT: Distance walked: 20 Assistive device utilized: None Level of assistance: Complete Independence Comments: minimal hip extension Lt, Lt lateral trunk shift, increased weight through Lt LE   TODAY'S TREATMENT                                       05/01/22 STS 2x10  NuStep L5 x14mins  Lat pull down 10# 2x10 with minA to pull down Seated rows 5lb 2x10 HS curls with band 2x10 yellow  LAQ no weight 2x10 Side step an tandem walking on airex in // bars Stair negotiation step to pattern  Alt 4 in box taps   04/29/22 NuStep L5 x54mins  STS 2x10  LAQ no weight 2x10 HS curls with band 2x10 Heel taps 6"  Calf stretch 30s  Calf raises 2x10 Lat pull down 10# 2x10 with minA to pull down Marching on airex holding out 1# WaTe 20 reps    04/24/22 2# seated LAQ, hip flex and abd 2 sets 10 Red tband trunk ext 2 sets 10 Red tband seated row and shld ext 2 sets 10 Nustep L 4 6 min UE and LE UBE L 2 2 min fwd / 2 back Lat pull 15 # 2 sets 10, help with the pull down ,focus on [place] and UE stretch Seated row 10 # 10 x Standing on airex marching 20 x 2 sets- CGA In // bars on airex with UE hip 3 way 10 x each Foam beam tandem fwd/back and laterally   04/09/22 Sit to stand with alternating LE adjusted for comfort Semirecumbent low trunk rotation HEP demo Seated thoracic rotation x5 reps HEP demo  PATIENT EDUCATION:  Education details: mechanics sit to stand,  Person educated: Patient and Child(ren) Education method: Explanation, Facilities manager, and  Handouts Education comprehension: verbalized understanding and returned demonstration  HOME EXERCISE PROGRAM: Access Code: [email or web address] URL: [email or web address] Date: 04/09/2022 Prepared by: [facility] - [facility]patient Rehab - [facility]  Exercises - Supine Lower Trunk Rotation  - 3 x daily - 7 x weekly - 1 sets - 10 reps - Seated Thoracic Flexion and Rotation with Arms Crossed  - 3 x daily - 7 x weekly - 1 sets - 10 reps  ASSESSMENT:  CLINICAL IMPRESSION: patient returns with bilateral  knee pain. Again requested to work on exercises today without weight on her legs to avoid being sore. She did really well with alt box taps and side steps without UE support. Most difficulty with tandem walking. Added seated rows without issue completing   OBJECTIVE IMPAIRMENTS: Abnormal gait, decreased activity tolerance, decreased balance, decreased knowledge of use of DME, decreased mobility, difficulty walking, decreased ROM, decreased strength, increased edema, impaired flexibility, improper body mechanics, postural dysfunction, and pain.   ACTIVITY LIMITATIONS: bending, standing, squatting, sleeping, stairs, and locomotion level  PARTICIPATION LIMITATIONS: cleaning, driving, community activity, and yard work  PERSONAL FACTORS: Age, Past/current experiences, Time since onset of injury/illness/exacerbation, and 3+ comorbidities: knee OA, lumbar DDD and scoliosis  are also affecting patient's functional outcome.   REHAB POTENTIAL: Good  CLINICAL DECISION MAKING: Evolving/moderate complexity  EVALUATION COMPLEXITY: High   GOALS: Goals reviewed with patient? Yes  SHORT TERM GOALS: Target date: 05/15/2022  Pt will be independent with her initial HEP to improve LE strength and pain. Baseline: Goal status: INITIAL  2.  Pt will be able to demonstrate proper body mechanics with bed mobility to decrease strain on her low back. Baseline:  Goal status: INITIAL     LONG  TERM GOALS: Target date: 07/24/2022   Pt will be able to complete 5x sit to stand in less than 15 sec with or without UE support, to reflect improvements in LE mobility and strength. Baseline: 23sec Goal status: INITIAL  2.  Pt will report atleast 40% improvement in her knee and low back pain from the start of PT. Baseline:  Goal status: INITIAL  3.  Pt will be able to ambulate with LRAD outdoors x29ft without assistance, to reflect improvement in her steadiness with ambulation. Baseline:  Goal status: INITIAL  4.  Pt will be able to maintain SLS for up to 2 sec each LE, 2/3 trials. Baseline:  Goal status: INITIAL  5.   Baseline:  Goal status: INITIAL  6.   Baseline:  Goal status: INITIAL   PLAN:  PT FREQUENCY: 2x/week  PT DURATION: 12 weeks  PLANNED INTERVENTIONS: Therapeutic exercises, Therapeutic activity, Neuromuscular re-education, Balance training, Gait training, Patient/Family education, Self Care, Joint mobilization, Aquatic Therapy, Electrical stimulation, Cryotherapy, Manual therapy, and Re-evaluation  PLAN FOR NEXT SESSION: need FOTO; BERG-set goal; progress LE strength, lumbar/thoracic flexibility and balance activity      Grayce Sessions, PTA 05/01/2022, 10:17 AM

## 2022-05-06 ENCOUNTER — Ambulatory Visit: Payer: Medicare Other | Admitting: Physical Therapy

## 2022-05-06 DIAGNOSIS — M6281 Muscle weakness (generalized): Secondary | ICD-10-CM

## 2022-05-06 DIAGNOSIS — R2681 Unsteadiness on feet: Secondary | ICD-10-CM

## 2022-05-06 DIAGNOSIS — M25562 Pain in left knee: Secondary | ICD-10-CM | POA: Diagnosis not present

## 2022-05-06 DIAGNOSIS — R262 Difficulty in walking, not elsewhere classified: Secondary | ICD-10-CM

## 2022-05-06 DIAGNOSIS — G8929 Other chronic pain: Secondary | ICD-10-CM

## 2022-05-06 NOTE — Therapy (Signed)
OUTPATIENT PHYSICAL THERAPY LOWER EXTREMITY TREATMENT   Patient Name: Danielle Dean MRN: [id number] DOB:1928-09-19, 88 y.o., female Today's Date: 05/06/2022   PT End of Session - 05/06/22 1020     Visit Number 5    Number of Visits 24    Date for PT Re-Evaluation 07/02/22    Authorization Type MCR and MCD    Authorization Time Period 04/09/22 to 07/02/22    PT Start Time 1015    PT Stop Time 1100    PT Time Calculation (min) 45 min             Past Medical History:  Diagnosis Date   Arthritis    Callus    Cataract fragments in both eyes following surgery    Corns and callosities    Hx of hysterectomy    Hypertension    Osteoarthritis    Past Surgical History:  Procedure Laterality Date   ABDOMINAL HYSTERECTOMY     NASAL SEPTUM SURGERY     ROTATOR CUFF REPAIR Right    TOTAL SHOULDER ARTHROPLASTY     Patient Active Problem List   Diagnosis Date Noted   DDD (degenerative disc disease), thoracic 11/06/2020   Other idiopathic scoliosis, thoracolumbar region 11/06/2020   DDD (degenerative disc disease), lumbar 11/06/2020   Age-related osteoporosis without current pathological fracture 11/06/2020   Porokeratosis 11/26/2018   Corns and callosities 11/26/2018   History of right shoulder replacement 10/07/2016   Chronic pain of both knees 05/20/2016   Osteoarthritis of both hands 05/20/2016   Essential hypertension 05/20/2016   Environmental allergies 05/20/2016   Primary osteoarthritis of both knees 05/19/2016   Callus of foot 11/24/2012   Pain in joint, ankle and foot 09/15/2012    PCP: Wenda Low, MD  REFERRING PROVIDER: Bo Merino, MD  REFERRING DIAG: Primary OA Bil knees, DDD lumbar, poor balance  THERAPY DIAG:  Chronic pain of left knee  Chronic pain of right knee  Muscle weakness (generalized)  Difficulty in walking, not elsewhere classified  Unsteadiness on feet  Rationale for Evaluation and Treatment Rehabilitation  ONSET DATE:  chronic issues-several years   SUBJECTIVE:   SUBJECTIVE STATEMENT: Legs and knees are hurting plus now some restless legs  PERTINENT HISTORY: Rt shoulder replacement.  DDD Lumbar/cervical spine. scoliosis PAIN:  Are you having pain? Yes: NPRS scale: 4/10 Pain location: knee/legs Pain description: aching Aggravating factors: prolonged standing, walking; low back pain with standing activity Relieving factors: sitting, wearing knee braces  PRECAUTIONS: Fall  WEIGHT BEARING RESTRICTIONS: No  FALLS:  Has patient fallen in last 6 months? No  LIVING ENVIRONMENT: Lives with: lives with their family and lives alone Lives in: House/apartment Stairs: doesn't need to use steps in her home Has following equipment at home:  rollator , [facility]  OCCUPATION: retired   PLOF: Independent with basic ADLs  PATIENT GOALS: decrease pain in knees/back; improve steadiness    OBJECTIVE:   DIAGNOSTIC FINDINGS:   PATIENT SURVEYS:  FOTO: next visit  COGNITION: Overall cognitive status: Within functional limits for tasks assessed     SENSATION: Not tested denies NT  EDEMA:  None noted at eval, will sometimes have swelling in Lt ankle   MUSCLE LENGTH: Hamstrings: Right 90/90 45 deg ; Left 90/90 45 deg   POSTURE: decreased lumbar lordosis and weight shift left trunk lateral shift Lt   PALPATION: Tenderness Rt glute max  LOWER EXTREMITY ROM:  Passive ROM Right eval Left eval  Hip flexion    Hip extension  Hip abduction    Hip adduction    Hip internal rotation    Hip external rotation    Knee flexion WNL WNL  Knee extension WNL WNL  Ankle dorsiflexion    Ankle plantarflexion    Ankle inversion    Ankle eversion     (Blank rows = not tested)   LOWER EXTREMITY MMT:  MMT Right eval Left eval  Hip flexion 3+/pain 4  Hip extension 3 3  Hip abduction 3 3  Hip adduction    Hip internal rotation    Hip external rotation    Knee flexion 3+ 3+  Knee extension 3+  3+/pain  Ankle dorsiflexion 4 4  Ankle plantarflexion    Ankle inversion    Ankle eversion     (Blank rows = not tested) LUMBAR ROM:   AROM eval  Flexion WNL  Extension Unable to reach neutral  Right lateral flexion   Left lateral flexion   Right rotation 75% limited  Left rotation 75% limited   (Blank rows = not tested)  ] LOWER EXTREMITY SPECIAL TESTS:    FUNCTIONAL TESTS:  5 times sit to stand: 23 sec, weight shift Lt, UE assist  GAIT: Distance walked: 20 Assistive device utilized: None Level of assistance: Complete Independence Comments: minimal hip extension Lt, Lt lateral trunk shift, increased weight through Lt LE   TODAY'S TREATMENT                                        05/06/22 Nustep L 5 11mn Resisted gait 20# 4 x fwd and 3 x each side- minA with increased knee pain esp comeing ack with left leading and decreased stability STS 2 sets 5 without UE with feet on foam mat- decreased eccentric LAQ, hip flex and hip abd 2 sets 10 1 # ( decreased from 2# as sh ehad decreased pain after using 2#) HS curl yellow tband 2 sets 10 Lat pull down 10# 2x10 with minA to pull down Seated rows 10lb 2x10 Foam beam in // bars tandem fwd/back, side stepping and marching   05/01/22 STS 2x10  NuStep L5 x67ms  Lat pull down 10# 2x10 with minA to pull down Seated rows 5lb 2x10 HS curls with band 2x10 yellow  LAQ no weight 2x10 Side step an tandem walking on airex in // bars Stair negotiation step to pattern  Alt 4 in box taps   04/29/22 NuStep L5 x6m20m  STS 2x10  LAQ no weight 2x10 HS curls with band 2x10 Heel taps 6"  Calf stretch 30s  Calf raises 2x10 Lat pull down 10# 2x10 with minA to pull down Marching on airex holding out 1# WaTe 20 reps    04/24/22 2# seated LAQ, hip flex and abd 2 sets 10 Red tband trunk ext 2 sets 10 Red tband seated row and shld ext 2 sets 10 Nustep L 4 6 min UE and LE UBE L 2 2 min fwd / 2 back Lat pull 15 # 2 sets 10, help with  the pull down ,focus on us [place] UE stretch Seated row 10 # 10 x Standing on airex marching 20 x 2 sets- CGA In // bars on airex with UE hip 3 way 10 x each Foam beam tandem fwd/back and laterally   04/09/22 Sit to stand with alternating LE adjusted for comfort Semirecumbent low trunk rotation HEP demo Seated thoracic  rotation x5 reps HEP demo  PATIENT EDUCATION:  Education details: mechanics sit to stand,  Person educated: Patient and Child(ren) Education method: Explanation, Demonstration, and Handouts Education comprehension: verbalized understanding and returned demonstration  HOME EXERCISE PROGRAM: Access Code: [email or web address] URL: [email or web address] Date: 04/09/2022 Prepared by: [facility]  Exercises - Supine Lower Trunk Rotation  - 3 x daily - 7 x weekly - 1 sets - 10 reps - Seated Thoracic Flexion and Rotation with Arms Crossed  - 3 x daily - 7 x weekly - 1 sets - 10 reps  ASSESSMENT:  CLINICAL IMPRESSION: patient returns with bilateral knee pain. Pt fearful of weights but tried to add some lighter weight back in. Cuing and assistance as noted above. STG met  OBJECTIVE IMPAIRMENTS: Abnormal gait, decreased activity tolerance, decreased balance, decreased knowledge of use of DME, decreased mobility, difficulty walking, decreased ROM, decreased strength, increased edema, impaired flexibility, improper body mechanics, postural dysfunction, and pain.   ACTIVITY LIMITATIONS: bending, standing, squatting, sleeping, stairs, and locomotion level  PARTICIPATION LIMITATIONS: cleaning, driving, community activity, and yard work  PERSONAL FACTORS: Age, Past/current experiences, Time since onset of injury/illness/exacerbation, and 3+ comorbidities: knee OA, lumbar DDD and scoliosis  are also affecting patient's functional outcome.   REHAB POTENTIAL: Good  CLINICAL DECISION MAKING: Evolving/moderate complexity  EVALUATION  COMPLEXITY: High   GOALS: Goals reviewed with patient? Yes  SHORT TERM GOALS: Target date: 05/20/2022  Pt will be independent with her initial HEP to improve LE strength and pain. Baseline: Goal status: met 05/06/22  2.  Pt will be able to demonstrate proper body mechanics with bed mobility to decrease strain on her low back. Baseline:  Goal status: met 05/06/22     LONG TERM GOALS: Target date: 07/29/2022   Pt will be able to complete 5x sit to stand in less than 15 sec with or without UE support, to reflect improvements in LE mobility and strength. Baseline: 23sec Goal status: INITIAL  2.  Pt will report atleast 40% improvement in her knee and low back pain from the start of PT. Baseline:  Goal status: INITIAL  3.  Pt will be able to ambulate with LRAD outdoors x268f without assistance, to reflect improvement in her steadiness with ambulation. Baseline:  Goal status: INITIAL  4.  Pt will be able to maintain SLS for up to 2 sec each LE, 2/3 trials. Baseline:  Goal status: INITIAL  5.   Baseline:  Goal status: INITIAL  6.   Baseline:  Goal status: INITIAL   PLAN:  PT FREQUENCY: 2x/week  PT DURATION: 12 weeks  PLANNED INTERVENTIONS: Therapeutic exercises, Therapeutic activity, Neuromuscular re-education, Balance training, Gait training, Patient/Family education, Self Care, Joint mobilization, Aquatic Therapy, Electrical stimulation, Cryotherapy, Manual therapy, and Re-evaluation  PLAN FOR NEXT SESSION: need FOTO; BERG-set goal; progress LE strength, lumbar/thoracic flexibility and balance activity      Matilyn Fehrman,ANGIE, PTA 05/06/2022, 10:20 AM [place] [address]Phone: [phone]  Fax:  [phone] Patient Details  Name: KMIRA BALONMRN: [id number] of Birth: 605-05-1930Referring Provider:  HWenda Low MD  Encounter Date: 05/06/2022   PLaqueta Carina  PTA 05/06/2022, 10:20 AM  [place] [address]Phone: [phone]  Fax:  [phone]

## 2022-05-12 NOTE — Therapy (Signed)
OUTPATIENT PHYSICAL THERAPY LOWER EXTREMITY TREATMENT   Patient Name: Danielle Dean MRN: [id number] DOB:July 02, 1928, 88 y.o., female Today's Date: 05/13/2022   PT End of Session - 05/13/22 1013     Visit Number 6    Number of Visits 24    Date for PT Re-Evaluation 07/02/22    Authorization Type MCR and MCD    Authorization Time Period 04/09/22 to 07/02/22    PT Start Time 1015    PT Stop Time 1100    PT Time Calculation (min) 45 min    Activity Tolerance Patient tolerated treatment well    Behavior During Therapy WFL for tasks assessed/performed              Past Medical History:  Diagnosis Date   Arthritis    Callus    Cataract fragments in both eyes following surgery    Corns and callosities    Hx of hysterectomy    Hypertension    Osteoarthritis    Past Surgical History:  Procedure Laterality Date   ABDOMINAL HYSTERECTOMY     NASAL SEPTUM SURGERY     ROTATOR CUFF REPAIR Right    TOTAL SHOULDER ARTHROPLASTY     Patient Active Problem List   Diagnosis Date Noted   DDD (degenerative disc disease), thoracic 11/06/2020   Other idiopathic scoliosis, thoracolumbar region 11/06/2020   DDD (degenerative disc disease), lumbar 11/06/2020   Age-related osteoporosis without current pathological fracture 11/06/2020   Porokeratosis 11/26/2018   Corns and callosities 11/26/2018   History of right shoulder replacement 10/07/2016   Chronic pain of both knees 05/20/2016   Osteoarthritis of both hands 05/20/2016   Essential hypertension 05/20/2016   Environmental allergies 05/20/2016   Primary osteoarthritis of both knees 05/19/2016   Callus of foot 11/24/2012   Pain in joint, ankle and foot 09/15/2012    PCP: Wenda Low, MD  REFERRING PROVIDER: Bo Merino, MD  REFERRING DIAG: Primary OA Bil knees, DDD lumbar, poor balance  THERAPY DIAG:  Chronic pain of left knee  Chronic pain of right knee  Muscle weakness (generalized)  Difficulty in walking, not  elsewhere classified  Unsteadiness on feet  Other abnormalities of gait and mobility  Acute bilateral low back pain without sciatica  Abnormal posture  Rationale for Evaluation and Treatment Rehabilitation  ONSET DATE: chronic issues-several years   SUBJECTIVE:   SUBJECTIVE STATEMENT: My knees and back are hurting today.   PERTINENT HISTORY: Rt shoulder replacement.  DDD Lumbar/cervical spine. scoliosis PAIN:  Are you having pain? Yes: NPRS scale: 4/10 Pain location: knee/legs Pain description: aching Aggravating factors: prolonged standing, walking; low back pain with standing activity Relieving factors: sitting, wearing knee braces  PRECAUTIONS: Fall  WEIGHT BEARING RESTRICTIONS: No  FALLS:  Has patient fallen in last 6 months? No  LIVING ENVIRONMENT: Lives with: lives with their family and lives alone Lives in: House/apartment Stairs: doesn't need to use steps in her home Has following equipment at home:  rollator , [employer]PC  OCCUPATION: retired   PLOF: Independent with basic ADLs  PATIENT GOALS: decrease pain in knees/back; improve steadiness    OBJECTIVE:   DIAGNOSTIC FINDINGS:   PATIENT SURVEYS:  FOTO: next visit  COGNITION: Overall cognitive status: Within functional limits for tasks assessed     SENSATION: Not tested denies NT  EDEMA:  None noted at eval, will sometimes have swelling in Lt ankle   MUSCLE LENGTH: Hamstrings: Right 90/90 45 deg ; Left 90/90 45 deg   POSTURE: decreased lumbar  lordosis and weight shift left trunk lateral shift Lt   PALPATION: Tenderness Rt glute max  LOWER EXTREMITY ROM:  Passive ROM Right eval Left eval  Hip flexion    Hip extension    Hip abduction    Hip adduction    Hip internal rotation    Hip external rotation    Knee flexion WNL WNL  Knee extension WNL WNL  Ankle dorsiflexion    Ankle plantarflexion    Ankle inversion    Ankle eversion     (Blank rows = not tested)   LOWER EXTREMITY  MMT:  MMT Right eval Left eval  Hip flexion 3+/pain 4  Hip extension 3 3  Hip abduction 3 3  Hip adduction    Hip internal rotation    Hip external rotation    Knee flexion 3+ 3+  Knee extension 3+ 3+/pain  Ankle dorsiflexion 4 4  Ankle plantarflexion    Ankle inversion    Ankle eversion     (Blank rows = not tested) LUMBAR ROM:   AROM eval  Flexion WNL  Extension Unable to reach neutral  Right lateral flexion   Left lateral flexion   Right rotation 75% limited  Left rotation 75% limited   (Blank rows = not tested)  ] LOWER EXTREMITY SPECIAL TESTS:    FUNCTIONAL TESTS:  5 times sit to stand: 23 sec, weight shift Lt, UE assist  GAIT: Distance walked: 20 Assistive device utilized: None Level of assistance: Complete Independence Comments: minimal hip extension Lt, Lt lateral trunk shift, increased weight through Lt LE   TODAY'S TREATMENT                                       05/13/22 NuStep L5 x76mns  STS on airex 2x5  LAQ 2x10 1#  HS curls green 2x10 Hip abd green 2x10  4" heel taps with 1# 2x10 CGA needed Lat pull downs 10# 2x10 Seated rows 5# 2x10 Foam beam in // bars tandem fwd/back, side stepping Standing ext yellow TB 2x10    05/06/22 Nustep L 5 623m Resisted gait 20# 4 x fwd and 3 x each side- minA with increased knee pain esp comeing ack with left leading and decreased stability STS 2 sets 5 without UE with feet on foam mat- decreased eccentric LAQ, hip flex and hip abd 2 sets 10 1 # ( decreased from 2# as sh ehad decreased pain after using 2#) HS curl yellow tband 2 sets 10 Lat pull down 10# 2x10 with minA to pull down Seated rows 10lb 2x10 Foam beam in // bars tandem fwd/back, side stepping and marching   05/01/22 STS 2x10  NuStep L5 x6m61m  Lat pull down 10# 2x10 with minA to pull down Seated rows 5lb 2x10 HS curls with band 2x10 yellow  LAQ no weight 2x10 Side step an tandem walking on airex in // bars Stair negotiation step to  pattern  Alt 4 in box taps   04/29/22 NuStep L5 x6mi19m STS 2x10  LAQ no weight 2x10 HS curls with band 2x10 Heel taps 6"  Calf stretch 30s  Calf raises 2x10 Lat pull down 10# 2x10 with minA to pull down Marching on airex holding out 1# WaTe 20 reps    04/24/22 2# seated LAQ, hip flex and abd 2 sets 10 Red tband trunk ext 2 sets 10 Red tband seated row and  shld ext 2 sets 10 Nustep L 4 6 min UE and LE UBE L 2 2 min fwd / 2 back Lat pull 15 # 2 sets 10, help with the pull down ,focus on [place] and UE stretch Seated row 10 # 10 x Standing on airex marching 20 x 2 sets- CGA In // bars on airex with UE hip 3 way 10 x each Foam beam tandem fwd/back and laterally   04/09/22 Sit to stand with alternating LE adjusted for comfort Semirecumbent low trunk rotation HEP demo Seated thoracic rotation x5 reps HEP demo  PATIENT EDUCATION:  Education details: mechanics sit to stand,  Person educated: Patient and Child(ren) Education method: Explanation, Media planner, and Handouts Education comprehension: verbalized understanding and returned demonstration  HOME EXERCISE PROGRAM: Access Code: [email or web address] URL: [email or web address] Date: 04/09/2022 Prepared by: [facility]  Exercises - Supine Lower Trunk Rotation  - 3 x daily - 7 x weekly - 1 sets - 10 reps - Seated Thoracic Flexion and Rotation with Arms Crossed  - 3 x daily - 7 x weekly - 1 sets - 10 reps  ASSESSMENT:  CLINICAL IMPRESSION: patient returns with bilateral knee pain and back pain stating everything hurts. Pt does well with low level weights today. Cuing and assistance as noted above.   OBJECTIVE IMPAIRMENTS: Abnormal gait, decreased activity tolerance, decreased balance, decreased knowledge of use of DME, decreased mobility, difficulty walking, decreased ROM, decreased strength, increased edema, impaired flexibility, improper body mechanics, postural dysfunction,  and pain.   ACTIVITY LIMITATIONS: bending, standing, squatting, sleeping, stairs, and locomotion level  PARTICIPATION LIMITATIONS: cleaning, driving, community activity, and yard work  PERSONAL FACTORS: Age, Past/current experiences, Time since onset of injury/illness/exacerbation, and 3+ comorbidities: knee OA, lumbar DDD and scoliosis  are also affecting patient's functional outcome.   REHAB POTENTIAL: Good  CLINICAL DECISION MAKING: Evolving/moderate complexity  EVALUATION COMPLEXITY: High   GOALS: Goals reviewed with patient? Yes  SHORT TERM GOALS: Target date: 05/27/2022  Pt will be independent with her initial HEP to improve LE strength and pain. Baseline: Goal status: met 05/06/22  2.  Pt will be able to demonstrate proper body mechanics with bed mobility to decrease strain on her low back. Baseline:  Goal status: met 05/06/22     LONG TERM GOALS: Target date: 08/05/2022   Pt will be able to complete 5x sit to stand in less than 15 sec with or without UE support, to reflect improvements in LE mobility and strength. Baseline: 23sec Goal status: INITIAL  2.  Pt will report atleast 40% improvement in her knee and low back pain from the start of PT. Baseline:  Goal status: INITIAL  3.  Pt will be able to ambulate with LRAD outdoors x271f without assistance, to reflect improvement in her steadiness with ambulation. Baseline:  Goal status: INITIAL  4.  Pt will be able to maintain SLS for up to 2 sec each LE, 2/3 trials. Baseline:  Goal status: INITIAL  5.   Baseline:  Goal status: INITIAL  6.   Baseline:  Goal status: INITIAL   PLAN:  PT FREQUENCY: 2x/week  PT DURATION: 12 weeks  PLANNED INTERVENTIONS: Therapeutic exercises, Therapeutic activity, Neuromuscular re-education, Balance training, Gait training, Patient/Family education, Self Care, Joint mobilization, Aquatic Therapy, Electrical stimulation, Cryotherapy, Manual therapy, and  Re-evaluation  PLAN FOR NEXT SESSION: need FOTO; BERG-set goal; progress LE strength, lumbar/thoracic flexibility and balance activity      MAndris Baumann PT 05/13/2022,  10:57 AM [place]. [place], [place], [address] Phone: [phone]   Fax:  [phone]  Patient Details  Name: ARWYN BESAW MRN: [id number] Date of Birth: October 10, 1928 Referring Provider:  Wenda Low, MD  Encounter Date: 05/13/2022   Andris Baumann, PT 05/13/2022, 10:57 AM  [address]. [place], [place], [address] Phone: [phone]   Fax:  [phone]

## 2022-05-13 ENCOUNTER — Ambulatory Visit: Payer: Medicare Other

## 2022-05-13 DIAGNOSIS — M25562 Pain in left knee: Secondary | ICD-10-CM | POA: Diagnosis not present

## 2022-05-13 DIAGNOSIS — M6281 Muscle weakness (generalized): Secondary | ICD-10-CM

## 2022-05-13 DIAGNOSIS — R2681 Unsteadiness on feet: Secondary | ICD-10-CM

## 2022-05-13 DIAGNOSIS — R293 Abnormal posture: Secondary | ICD-10-CM

## 2022-05-13 DIAGNOSIS — G8929 Other chronic pain: Secondary | ICD-10-CM

## 2022-05-13 DIAGNOSIS — M545 Low back pain, unspecified: Secondary | ICD-10-CM

## 2022-05-13 DIAGNOSIS — R262 Difficulty in walking, not elsewhere classified: Secondary | ICD-10-CM

## 2022-05-13 DIAGNOSIS — R2689 Other abnormalities of gait and mobility: Secondary | ICD-10-CM

## 2022-05-15 ENCOUNTER — Ambulatory Visit: Payer: Medicare Other | Admitting: Physical Therapy

## 2022-05-20 ENCOUNTER — Ambulatory Visit: Payer: Medicare Other | Attending: Internal Medicine | Admitting: Physical Therapy

## 2022-05-20 ENCOUNTER — Encounter: Payer: Self-pay | Admitting: Physical Therapy

## 2022-05-20 DIAGNOSIS — M25562 Pain in left knee: Secondary | ICD-10-CM | POA: Diagnosis not present

## 2022-05-20 DIAGNOSIS — G8929 Other chronic pain: Secondary | ICD-10-CM | POA: Diagnosis present

## 2022-05-20 DIAGNOSIS — M25561 Pain in right knee: Secondary | ICD-10-CM | POA: Insufficient documentation

## 2022-05-20 DIAGNOSIS — M545 Low back pain, unspecified: Secondary | ICD-10-CM

## 2022-05-20 DIAGNOSIS — R262 Difficulty in walking, not elsewhere classified: Secondary | ICD-10-CM | POA: Diagnosis present

## 2022-05-20 DIAGNOSIS — R2689 Other abnormalities of gait and mobility: Secondary | ICD-10-CM | POA: Diagnosis present

## 2022-05-20 DIAGNOSIS — M6281 Muscle weakness (generalized): Secondary | ICD-10-CM | POA: Diagnosis present

## 2022-05-20 DIAGNOSIS — R2681 Unsteadiness on feet: Secondary | ICD-10-CM

## 2022-05-20 NOTE — Therapy (Signed)
OUTPATIENT PHYSICAL THERAPY LOWER EXTREMITY TREATMENT   Patient Name: Danielle Dean MRN: [id number] DOB:26-Sep-1928, 88 y.o., female Today's Date: 05/20/2022   PT End of Session - 05/20/22 1017     Visit Number 7    Date for PT Re-Evaluation 07/02/22    PT Start Time 1012    PT Stop Time 1053    PT Time Calculation (min) 41 min    Activity Tolerance Patient tolerated treatment well    Behavior During Therapy WFL for tasks assessed/performed               Past Medical History:  Diagnosis Date   Arthritis    Callus    Cataract fragments in both eyes following surgery    Corns and callosities    Hx of hysterectomy    Hypertension    Osteoarthritis    Past Surgical History:  Procedure Laterality Date   ABDOMINAL HYSTERECTOMY     NASAL SEPTUM SURGERY     ROTATOR CUFF REPAIR Right    TOTAL SHOULDER ARTHROPLASTY     Patient Active Problem List   Diagnosis Date Noted   DDD (degenerative disc disease), thoracic 11/06/2020   Other idiopathic scoliosis, thoracolumbar region 11/06/2020   DDD (degenerative disc disease), lumbar 11/06/2020   Age-related osteoporosis without current pathological fracture 11/06/2020   Porokeratosis 11/26/2018   Corns and callosities 11/26/2018   History of right shoulder replacement 10/07/2016   Chronic pain of both knees 05/20/2016   Osteoarthritis of both hands 05/20/2016   Essential hypertension 05/20/2016   Environmental allergies 05/20/2016   Primary osteoarthritis of both knees 05/19/2016   Callus of foot 11/24/2012   Pain in joint, ankle and foot 09/15/2012    PCP: Wenda Low, MD  REFERRING PROVIDER: Bo Merino, MD  REFERRING DIAG: Primary OA Bil knees, DDD lumbar, poor balance  THERAPY DIAG:  Chronic pain of left knee  Chronic pain of right knee  Muscle weakness (generalized)  Unsteadiness on feet  Difficulty in walking, not elsewhere classified  Other abnormalities of gait and mobility  Acute bilateral  low back pain without sciatica  Rationale for Evaluation and Treatment Rehabilitation  ONSET DATE: chronic issues-several years   SUBJECTIVE:   SUBJECTIVE STATEMENT: Patient reports that her knees hurt all the time. She is taking Tylenol to control the pain.  PERTINENT HISTORY: Rt shoulder replacement.  DDD Lumbar/cervical spine. scoliosis PAIN:  Are you having pain? Yes: NPRS scale: 4/10 Pain location: knee/legs Pain description: aching Aggravating factors: prolonged standing, walking; low back pain with standing activity Relieving factors: sitting, wearing knee braces  PRECAUTIONS: Fall  WEIGHT BEARING RESTRICTIONS: No  FALLS:  Has patient fallen in last 6 months? No  LIVING ENVIRONMENT: Lives with: lives with their family and lives alone Lives in: House/apartment Stairs: doesn't need to use steps in her home Has following equipment at home:  rollator , [facility]  OCCUPATION: retired   PLOF: Independent with basic ADLs  PATIENT GOALS: decrease pain in knees/back; improve steadiness    OBJECTIVE:   DIAGNOSTIC FINDINGS:   PATIENT SURVEYS:  FOTO: next visit  COGNITION: Overall cognitive status: Within functional limits for tasks assessed     SENSATION: Not tested denies NT  EDEMA:  None noted at eval, will sometimes have swelling in Lt ankle   MUSCLE LENGTH: Hamstrings: Right 90/90 45 deg ; Left 90/90 45 deg   POSTURE: decreased lumbar lordosis and weight shift left trunk lateral shift Lt   PALPATION: Tenderness Rt glute max  LOWER EXTREMITY ROM:  Passive ROM Right eval Left eval  Hip flexion    Hip extension    Hip abduction    Hip adduction    Hip internal rotation    Hip external rotation    Knee flexion WNL WNL  Knee extension WNL WNL  Ankle dorsiflexion    Ankle plantarflexion    Ankle inversion    Ankle eversion     (Blank rows = not tested)   LOWER EXTREMITY MMT:  MMT Right eval Left eval  Hip flexion 3+/pain 4  Hip extension  3 3  Hip abduction 3 3  Hip adduction    Hip internal rotation    Hip external rotation    Knee flexion 3+ 3+  Knee extension 3+ 3+/pain  Ankle dorsiflexion 4 4  Ankle plantarflexion    Ankle inversion    Ankle eversion     (Blank rows = not tested) LUMBAR ROM:   AROM eval  Flexion WNL  Extension Unable to reach neutral  Right lateral flexion   Left lateral flexion   Right rotation 75% limited  Left rotation 75% limited   (Blank rows = not tested)  ] LOWER EXTREMITY SPECIAL TESTS:    FUNCTIONAL TESTS:  5 times sit to stand: 23 sec, weight shift Lt, UE assist  GAIT: Distance walked: 20 Assistive device utilized: None Level of assistance: Complete Independence Comments: minimal hip extension Lt, Lt lateral trunk shift, increased weight through Lt LE   TODAY'S TREATMENT                                       05/20/22 NuStep L3 x 6 minutes Performed supine lower body ROM over physioball, rolling B KTC and LTR with assist. Practiced pelvic mobilization, patient with great difficulty moving from lower body, overuses upper body.-Tried in Supine, then in sitting with Up body stabilized-still required mod TC.   05/13/22 NuStep L5 x59mns  STS on airex 2x5  LAQ 2x10 1#  HS curls green 2x10 Hip abd green 2x10  4" heel taps with 1# 2x10 CGA needed Lat pull downs 10# 2x10 Seated rows 5# 2x10 Foam beam in // bars tandem fwd/back, side stepping Standing ext yellow TB 2x10    05/06/22 Nustep L 5 628m Resisted gait 20# 4 x fwd and 3 x each side- minA with increased knee pain esp comeing ack with left leading and decreased stability STS 2 sets 5 without UE with feet on foam mat- decreased eccentric LAQ, hip flex and hip abd 2 sets 10 1 # ( decreased from 2# as sh ehad decreased pain after using 2#) HS curl yellow tband 2 sets 10 Lat pull down 10# 2x10 with minA to pull down Seated rows 10lb 2x10 Foam beam in // bars tandem fwd/back, side stepping and  marching   05/01/22 STS 2x10  NuStep L5 x6m67m  Lat pull down 10# 2x10 with minA to pull down Seated rows 5lb 2x10 HS curls with band 2x10 yellow  LAQ no weight 2x10 Side step an tandem walking on airex in // bars Stair negotiation step to pattern  Alt 4 in box taps   04/29/22 NuStep L5 x6mi6m STS 2x10  LAQ no weight 2x10 HS curls with band 2x10 Heel taps 6"  Calf stretch 30s  Calf raises 2x10 Lat pull down 10# 2x10 with minA to pull down Marching on airex holding  out 1# WaTe 20 reps    04/24/22 2# seated LAQ, hip flex and abd 2 sets 10 Red tband trunk ext 2 sets 10 Red tband seated row and shld ext 2 sets 10 Nustep L 4 6 min UE and LE UBE L 2 2 min fwd / 2 back Lat pull 15 # 2 sets 10, help with the pull down ,focus on [place] and UE stretch Seated row 10 # 10 x Standing on airex marching 20 x 2 sets- CGA In // bars on airex with UE hip 3 way 10 x each Foam beam tandem fwd/back and laterally   04/09/22 Sit to stand with alternating LE adjusted for comfort Semirecumbent low trunk rotation HEP demo Seated thoracic rotation x5 reps HEP demo  PATIENT EDUCATION:  Education details: mechanics sit to stand,  Person educated: Patient and Child(ren) Education method: Explanation, Media planner, and Handouts Education comprehension: verbalized understanding and returned demonstration  HOME EXERCISE PROGRAM: Access Code: [place],  [id number] URL: [email or web address] Date: 04/09/2022 Prepared by: [facility]  ASSESSMENT:  CLINICAL IMPRESSION: Patient continues to report B knee pain with bone on bone, and lower back pain. Treatment focused on lower body today, attempting to improve her lower trunk mobilization, adding some stretching to her HEP. She has great difficulty isolating her lower trunk to move in ant/post plane.  OBJECTIVE IMPAIRMENTS: Abnormal gait, decreased activity tolerance, decreased balance,  decreased knowledge of use of DME, decreased mobility, difficulty walking, decreased ROM, decreased strength, increased edema, impaired flexibility, improper body mechanics, postural dysfunction, and pain.   ACTIVITY LIMITATIONS: bending, standing, squatting, sleeping, stairs, and locomotion level  PARTICIPATION LIMITATIONS: cleaning, driving, community activity, and yard work  PERSONAL FACTORS: Age, Past/current experiences, Time since onset of injury/illness/exacerbation, and 3+ comorbidities: knee OA, lumbar DDD and scoliosis  are also affecting patient's functional outcome.   REHAB POTENTIAL: Good  CLINICAL DECISION MAKING: Evolving/moderate complexity  EVALUATION COMPLEXITY: High   GOALS: Goals reviewed with patient? Yes  SHORT TERM GOALS: Target date: 06/03/2022  Pt will be independent with her initial HEP to improve LE strength and pain. Baseline: Goal status: met 05/06/22  2.  Pt will be able to demonstrate proper body mechanics with bed mobility to decrease strain on her low back. Baseline:  Goal status: met 05/06/22     LONG TERM GOALS: Target date: 08/12/2022   Pt will be able to complete 5x sit to stand in less than 15 sec with or without UE support, to reflect improvements in LE mobility and strength. Baseline: 23sec Goal status: INITIAL  2.  Pt will report atleast 40% improvement in her knee and low back pain from the start of PT. Baseline:  Goal status: INITIAL  3.  Pt will be able to ambulate with LRAD outdoors x256f without assistance, to reflect improvement in her steadiness with ambulation. Baseline:  Goal status: ongoing  4.  Pt will be able to maintain SLS for up to 2 sec each LE, 2/3 trials. Baseline:  Goal status: ongoing  5.   Baseline:  Goal status: INITIAL  6.   Baseline:  Goal status: INITIAL   PLAN:  PT FREQUENCY: 2x/week  PT DURATION: 12 weeks  PLANNED INTERVENTIONS: Therapeutic exercises, Therapeutic activity, Neuromuscular  re-education, Balance training, Gait training, Patient/Family education, Self Care, Joint mobilization, Aquatic Therapy, Electrical stimulation, Cryotherapy, Manual therapy, and Re-evaluation  PLAN FOR NEXT SESSION: need FOTO; BERG-set goal; progress LE strength, lumbar/thoracic flexibility and balance activity  Marcelina Morel, PT 05/20/2022, 10:58 AM [address] [place], [place], [address] Phone: [phone]   Fax:  [phone]  Patient Details  Name: Danielle Dean MRN: [id number] Date of Birth: 16-Jul-1928 Referring Provider:  Wenda Low, MD  Encounter Date: 05/20/2022   Marcelina Morel, DPT 05/20/2022, 10:58 AM  [address] [place], [place], [address] Phone: [phone]   Fax:  [phone]

## 2022-05-21 NOTE — Therapy (Signed)
OUTPATIENT PHYSICAL THERAPY LOWER EXTREMITY TREATMENT   Patient Name: Danielle Dean MRN: [id number] DOB:08-04-1928, 88 y.o., female Today's Date: 05/20/2022   PT End of Session - 05/20/22 1017     Visit Number 7    Date for PT Re-Evaluation 07/02/22    PT Start Time 1012    PT Stop Time 1053    PT Time Calculation (min) 41 min    Activity Tolerance Patient tolerated treatment well    Behavior During Therapy WFL for tasks assessed/performed               Past Medical History:  Diagnosis Date   Arthritis    Callus    Cataract fragments in both eyes following surgery    Corns and callosities    Hx of hysterectomy    Hypertension    Osteoarthritis    Past Surgical History:  Procedure Laterality Date   ABDOMINAL HYSTERECTOMY     NASAL SEPTUM SURGERY     ROTATOR CUFF REPAIR Right    TOTAL SHOULDER ARTHROPLASTY     Patient Active Problem List   Diagnosis Date Noted   DDD (degenerative disc disease), thoracic 11/06/2020   Other idiopathic scoliosis, thoracolumbar region 11/06/2020   DDD (degenerative disc disease), lumbar 11/06/2020   Age-related osteoporosis without current pathological fracture 11/06/2020   Porokeratosis 11/26/2018   Corns and callosities 11/26/2018   History of right shoulder replacement 10/07/2016   Chronic pain of both knees 05/20/2016   Osteoarthritis of both hands 05/20/2016   Essential hypertension 05/20/2016   Environmental allergies 05/20/2016   Primary osteoarthritis of both knees 05/19/2016   Callus of foot 11/24/2012   Pain in joint, ankle and foot 09/15/2012    PCP: Wenda Low, MD  REFERRING PROVIDER: Bo Merino, MD  REFERRING DIAG: Primary OA Bil knees, DDD lumbar, poor balance  THERAPY DIAG:  Chronic pain of left knee  Chronic pain of right knee  Muscle weakness (generalized)  Unsteadiness on feet  Difficulty in walking, not elsewhere classified  Other abnormalities of gait and mobility  Acute bilateral  low back pain without sciatica  Rationale for Evaluation and Treatment Rehabilitation  ONSET DATE: chronic issues-several years   SUBJECTIVE:   SUBJECTIVE STATEMENT: Patient reports that her knees hurt all the time. She is taking Tylenol to control the pain.  PERTINENT HISTORY: Rt shoulder replacement.  DDD Lumbar/cervical spine. scoliosis PAIN:  Are you having pain? Yes: NPRS scale: 4/10 Pain location: knee/legs Pain description: aching Aggravating factors: prolonged standing, walking; low back pain with standing activity Relieving factors: sitting, wearing knee braces  PRECAUTIONS: Fall  WEIGHT BEARING RESTRICTIONS: No  FALLS:  Has patient fallen in last 6 months? No  LIVING ENVIRONMENT: Lives with: lives with their family and lives alone Lives in: House/apartment Stairs: doesn't need to use steps in her home Has following equipment at home:  rollator , [facility]  OCCUPATION: retired   PLOF: Independent with basic ADLs  PATIENT GOALS: decrease pain in knees/back; improve steadiness    OBJECTIVE:   DIAGNOSTIC FINDINGS:   PATIENT SURVEYS:  FOTO: next visit  COGNITION: Overall cognitive status: Within functional limits for tasks assessed     SENSATION: Not tested denies NT  EDEMA:  None noted at eval, will sometimes have swelling in Lt ankle   MUSCLE LENGTH: Hamstrings: Right 90/90 45 deg ; Left 90/90 45 deg   POSTURE: decreased lumbar lordosis and weight shift left trunk lateral shift Lt   PALPATION: Tenderness Rt glute max  LOWER EXTREMITY ROM:  Passive ROM Right eval Left eval  Hip flexion    Hip extension    Hip abduction    Hip adduction    Hip internal rotation    Hip external rotation    Knee flexion WNL WNL  Knee extension WNL WNL  Ankle dorsiflexion    Ankle plantarflexion    Ankle inversion    Ankle eversion     (Blank rows = not tested)   LOWER EXTREMITY MMT:  MMT Right eval Left eval  Hip flexion 3+/pain 4  Hip extension  3 3  Hip abduction 3 3  Hip adduction    Hip internal rotation    Hip external rotation    Knee flexion 3+ 3+  Knee extension 3+ 3+/pain  Ankle dorsiflexion 4 4  Ankle plantarflexion    Ankle inversion    Ankle eversion     (Blank rows = not tested) LUMBAR ROM:   AROM eval  Flexion WNL  Extension Unable to reach neutral  Right lateral flexion   Left lateral flexion   Right rotation 75% limited  Left rotation 75% limited   (Blank rows = not tested)  ] LOWER EXTREMITY SPECIAL TESTS:    FUNCTIONAL TESTS:  5 times sit to stand: 23 sec, weight shift Lt, UE assist  GAIT: Distance walked: 20 Assistive device utilized: None Level of assistance: Complete Independence Comments: minimal hip extension Lt, Lt lateral trunk shift, increased weight through Lt LE   TODAY'S TREATMENT                                       05/22/22 NuStep Rows and Lats Standing rows and ext with band LAQ with 1.5# Standing marches 1.5# Hip abd 1.5#   05/20/22 NuStep L3 x 6 minutes Performed supine lower body ROM over physioball, rolling B KTC and LTR with assist. Practiced pelvic mobilization, patient with great difficulty moving from lower body, overuses upper body.-Tried in Supine, then in sitting with Up body stabilized-still required mod TC.   05/13/22 NuStep L5 x43mns  STS on airex 2x5  LAQ 2x10 1#  HS curls green 2x10 Hip abd green 2x10  4" heel taps with 1# 2x10 CGA needed Lat pull downs 10# 2x10 Seated rows 5# 2x10 Foam beam in // bars tandem fwd/back, side stepping Standing ext yellow TB 2x10    05/06/22 Nustep L 5 618m Resisted gait 20# 4 x fwd and 3 x each side- minA with increased knee pain esp comeing ack with left leading and decreased stability STS 2 sets 5 without UE with feet on foam mat- decreased eccentric LAQ, hip flex and hip abd 2 sets 10 1 # ( decreased from 2# as sh ehad decreased pain after using 2#) HS curl yellow tband 2 sets 10 Lat pull down 10# 2x10 with  minA to pull down Seated rows 10lb 2x10 Foam beam in // bars tandem fwd/back, side stepping and marching   05/01/22 STS 2x10  NuStep L5 x6m4m  Lat pull down 10# 2x10 with minA to pull down Seated rows 5lb 2x10 HS curls with band 2x10 yellow  LAQ no weight 2x10 Side step an tandem walking on airex in // bars Stair negotiation step to pattern  Alt 4 in box taps   04/29/22 NuStep L5 x6mi5m STS 2x10  LAQ no weight 2x10 HS curls with band 2x10 Heel taps 6"  Calf stretch 30s  Calf raises 2x10 Lat pull down 10# 2x10 with minA to pull down Marching on airex holding out 1# WaTe 20 reps    04/24/22 2# seated LAQ, hip flex and abd 2 sets 10 Red tband trunk ext 2 sets 10 Red tband seated row and shld ext 2 sets 10 Nustep L 4 6 min UE and LE UBE L 2 2 min fwd / 2 back Lat pull 15 # 2 sets 10, help with the pull down ,focus on [place] and UE stretch Seated row 10 # 10 x Standing on airex marching 20 x 2 sets- CGA In // bars on airex with UE hip 3 way 10 x each Foam beam tandem fwd/back and laterally   04/09/22 Sit to stand with alternating LE adjusted for comfort Semirecumbent low trunk rotation HEP demo Seated thoracic rotation x5 reps HEP demo  PATIENT EDUCATION:  Education details: mechanics sit to stand,  Person educated: Patient and Child(ren) Education method: Explanation, Media planner, and Handouts Education comprehension: verbalized understanding and returned demonstration  HOME EXERCISE PROGRAM: Access Code: [place],  [id number] URL: [email or web address] Date: 04/09/2022 Prepared by: [facility]  ASSESSMENT:  CLINICAL IMPRESSION: Patient continues to report B knee pain with bone on bone, and lower back pain. Treatment focused on lower body today, attempting to improve her lower trunk mobilization, adding some stretching to her HEP. She has great difficulty isolating her lower trunk to move in ant/post  plane.  OBJECTIVE IMPAIRMENTS: Abnormal gait, decreased activity tolerance, decreased balance, decreased knowledge of use of DME, decreased mobility, difficulty walking, decreased ROM, decreased strength, increased edema, impaired flexibility, improper body mechanics, postural dysfunction, and pain.   ACTIVITY LIMITATIONS: bending, standing, squatting, sleeping, stairs, and locomotion level  PARTICIPATION LIMITATIONS: cleaning, driving, community activity, and yard work  PERSONAL FACTORS: Age, Past/current experiences, Time since onset of injury/illness/exacerbation, and 3+ comorbidities: knee OA, lumbar DDD and scoliosis  are also affecting patient's functional outcome.   REHAB POTENTIAL: Good  CLINICAL DECISION MAKING: Evolving/moderate complexity  EVALUATION COMPLEXITY: High   GOALS: Goals reviewed with patient? Yes  SHORT TERM GOALS: Target date: 06/03/2022  Pt will be independent with her initial HEP to improve LE strength and pain. Baseline: Goal status: met 05/06/22  2.  Pt will be able to demonstrate proper body mechanics with bed mobility to decrease strain on her low back. Baseline:  Goal status: met 05/06/22     LONG TERM GOALS: Target date: 08/12/2022   Pt will be able to complete 5x sit to stand in less than 15 sec with or without UE support, to reflect improvements in LE mobility and strength. Baseline: 23sec Goal status: INITIAL  2.  Pt will report atleast 40% improvement in her knee and low back pain from the start of PT. Baseline:  Goal status: INITIAL  3.  Pt will be able to ambulate with LRAD outdoors x225f without assistance, to reflect improvement in her steadiness with ambulation. Baseline:  Goal status: ongoing  4.  Pt will be able to maintain SLS for up to 2 sec each LE, 2/3 trials. Baseline:  Goal status: ongoing  5.   Baseline:  Goal status: INITIAL  6.   Baseline:  Goal status: INITIAL   PLAN:  PT FREQUENCY: 2x/week  PT  DURATION: 12 weeks  PLANNED INTERVENTIONS: Therapeutic exercises, Therapeutic activity, Neuromuscular re-education, Balance training, Gait training, Patient/Family education, Self Care, Joint mobilization, Aquatic Therapy, Electrical stimulation, Cryotherapy, Manual therapy, and  Re-evaluation  PLAN FOR NEXT SESSION: need FOTO; BERG-set goal; progress LE strength, lumbar/thoracic flexibility and balance activity      Marcelina Morel, PT 05/20/2022, 10:58 AM [address] [place], [place], [address] Phone: [phone]   Fax:  [phone]  Patient Details  Name: Danielle Dean MRN: [id number] Date of Birth: Sep 22, 1928 Referring Provider:  Wenda Low, MD  Encounter Date: 05/20/2022   Marcelina Morel, DPT 05/20/2022, 10:58 AM  [address]. [place], [place], [address] Phone: [phone]   Fax:  [phone]

## 2022-05-22 ENCOUNTER — Ambulatory Visit: Payer: Medicare Other

## 2022-05-22 DIAGNOSIS — R2689 Other abnormalities of gait and mobility: Secondary | ICD-10-CM

## 2022-05-22 DIAGNOSIS — R2681 Unsteadiness on feet: Secondary | ICD-10-CM

## 2022-05-22 DIAGNOSIS — R262 Difficulty in walking, not elsewhere classified: Secondary | ICD-10-CM

## 2022-05-22 DIAGNOSIS — M6281 Muscle weakness (generalized): Secondary | ICD-10-CM

## 2022-05-22 DIAGNOSIS — G8929 Other chronic pain: Secondary | ICD-10-CM

## 2022-05-22 DIAGNOSIS — M25562 Pain in left knee: Secondary | ICD-10-CM | POA: Diagnosis not present

## 2022-05-27 ENCOUNTER — Ambulatory Visit: Payer: Medicare Other

## 2022-05-27 DIAGNOSIS — R262 Difficulty in walking, not elsewhere classified: Secondary | ICD-10-CM

## 2022-05-27 DIAGNOSIS — R2689 Other abnormalities of gait and mobility: Secondary | ICD-10-CM

## 2022-05-27 DIAGNOSIS — M25562 Pain in left knee: Secondary | ICD-10-CM | POA: Diagnosis not present

## 2022-05-27 DIAGNOSIS — G8929 Other chronic pain: Secondary | ICD-10-CM

## 2022-05-27 DIAGNOSIS — R2681 Unsteadiness on feet: Secondary | ICD-10-CM

## 2022-05-27 DIAGNOSIS — M6281 Muscle weakness (generalized): Secondary | ICD-10-CM

## 2022-05-27 NOTE — Therapy (Signed)
OUTPATIENT PHYSICAL THERAPY LOWER EXTREMITY TREATMENT   Patient Name: Danielle Dean MRN: [id number] DOB:[birth year], 88 y.o., female Today's Date: 05/27/2022   PT End of Session - 05/27/22 1012     Visit Number 9    Date for PT Re-Evaluation 07/02/22    PT Start Time 1012    PT Stop Time 1055    PT Time Calculation (min) 43 min    Activity Tolerance Patient tolerated treatment well    Behavior During Therapy WFL for tasks assessed/performed               Past Medical History:  Diagnosis Date   Arthritis    Callus    Cataract fragments in both eyes following surgery    Corns and callosities    Hx of hysterectomy    Hypertension    Osteoarthritis    Past Surgical History:  Procedure Laterality Date   ABDOMINAL HYSTERECTOMY     NASAL SEPTUM SURGERY     ROTATOR CUFF REPAIR Right    TOTAL SHOULDER ARTHROPLASTY     Patient Active Problem List   Diagnosis Date Noted   DDD (degenerative disc disease), thoracic 11/06/2020   Other idiopathic scoliosis, thoracolumbar region 11/06/2020   DDD (degenerative disc disease), lumbar 11/06/2020   Age-related osteoporosis without current pathological fracture 11/06/2020   Porokeratosis 11/26/2018   Corns and callosities 11/26/2018   History of right shoulder replacement 10/07/2016   Chronic pain of both knees 05/20/2016   Osteoarthritis of both hands 05/20/2016   Essential hypertension 05/20/2016   Environmental allergies 05/20/2016   Primary osteoarthritis of both knees 05/19/2016   Callus of foot 11/24/2012   Pain in joint, ankle and foot 09/15/2012    PCP: Wenda Low, MD  REFERRING PROVIDER: Bo Merino, MD  REFERRING DIAG: Primary OA Bil knees, DDD lumbar, poor balance  THERAPY DIAG:  Chronic pain of left knee  Chronic pain of right knee  Muscle weakness (generalized)  Unsteadiness on feet  Difficulty in walking, not elsewhere classified  Other abnormalities of gait and mobility  Rationale for  Evaluation and Treatment Rehabilitation  ONSET DATE: chronic issues-several years   SUBJECTIVE:   SUBJECTIVE STATEMENT: "I have this machine for my knees I use" My knees still always hurting   PERTINENT HISTORY: Rt shoulder replacement.  DDD Lumbar/cervical spine. scoliosis PAIN:  Are you having pain? Yes: NPRS scale: 4/10 Pain location: knee/legs Pain description: aching Aggravating factors: prolonged standing, walking; low back pain with standing activity Relieving factors: sitting, wearing knee braces  PRECAUTIONS: Fall  WEIGHT BEARING RESTRICTIONS: No  FALLS:  Has patient fallen in last 6 months? No  LIVING ENVIRONMENT: Lives with: lives with their family and lives alone Lives in: House/apartment Stairs: doesn't need to use steps in her home Has following equipment at home:  rollator , [facility]  OCCUPATION: retired   PLOF: Independent with basic ADLs  PATIENT GOALS: decrease pain in knees/back; improve steadiness    OBJECTIVE:   DIAGNOSTIC FINDINGS:   PATIENT SURVEYS:  FOTO: next visit  COGNITION: Overall cognitive status: Within functional limits for tasks assessed     SENSATION: Not tested denies NT  EDEMA:  None noted at eval, will sometimes have swelling in Lt ankle   MUSCLE LENGTH: Hamstrings: Right 90/90 45 deg ; Left 90/90 45 deg   POSTURE: decreased lumbar lordosis and weight shift left trunk lateral shift Lt   PALPATION: Tenderness Rt glute max  LOWER EXTREMITY ROM:  Passive ROM Right eval Left eval  Hip flexion    Hip extension    Hip abduction    Hip adduction    Hip internal rotation    Hip external rotation    Knee flexion WNL WNL  Knee extension WNL WNL  Ankle dorsiflexion    Ankle plantarflexion    Ankle inversion    Ankle eversion     (Blank rows = not tested)   LOWER EXTREMITY MMT:  MMT Right eval Left eval  Hip flexion 3+/pain 4  Hip extension 3 3  Hip abduction 3 3  Hip adduction    Hip internal rotation     Hip external rotation    Knee flexion 3+ 3+  Knee extension 3+ 3+/pain  Ankle dorsiflexion 4 4  Ankle plantarflexion    Ankle inversion    Ankle eversion     (Blank rows = not tested) LUMBAR ROM:   AROM eval  Flexion WNL  Extension Unable to reach neutral  Right lateral flexion   Left lateral flexion   Right rotation 75% limited  Left rotation 75% limited   (Blank rows = not tested)  ] LOWER EXTREMITY SPECIAL TESTS:    FUNCTIONAL TESTS:  5 times sit to stand: 23 sec, weight shift Lt, UE assist  GAIT: Distance walked: 20 Assistive device utilized: None Level of assistance: Complete Independence Comments: minimal hip extension Lt, Lt lateral trunk shift, increased weight through Lt LE   TODAY'S TREATMENT                                       05/27/22 NuStep L5 x39mns  STS with ball push out x10, x10 without ball  Walking on beam Calf stretch 30s  Seated rows 5# 2x10 Lat pull downs 10# 2x10 Heel taps 4" Up and down stair case 2x with support on rails  LAQ 1.5# 2x10 HS curls greenTB 2x10  05/22/22 NuStep L3 x637ms Seated rows 5# 2x10 Lat pull downs 10# 2x10 blackTB ext 2x10 Standing rows and ext with red band 2x10 LAQ with 1.5# 2x10 Standing marches 1.5# 20 reps Hip abd 1.5# 2x10  05/20/22 NuStep L3 x 6 minutes Performed supine lower body ROM over physioball, rolling B KTC and LTR with assist. Practiced pelvic mobilization, patient with great difficulty moving from lower body, overuses upper body.-Tried in Supine, then in sitting with Up body stabilized-still required mod TC.   05/13/22 NuStep L5 x7m31m  STS on airex 2x5  LAQ 2x10 1#  HS curls green 2x10 Hip abd green 2x10  4" heel taps with 1# 2x10 CGA needed Lat pull downs 10# 2x10 Seated rows 5# 2x10 Foam beam in // bars tandem fwd/back, side stepping Standing ext yellow TB 2x10    05/06/22 Nustep L 5 6mi72mesisted gait 20# 4 x fwd and 3 x each side- minA with increased knee pain esp comeing  ack with left leading and decreased stability STS 2 sets 5 without UE with feet on foam mat- decreased eccentric LAQ, hip flex and hip abd 2 sets 10 1 # ( decreased from 2# as sh ehad decreased pain after using 2#) HS curl yellow tband 2 sets 10 Lat pull down 10# 2x10 with minA to pull down Seated rows 10lb 2x10 Foam beam in // bars tandem fwd/back, side stepping and marching   05/01/22 STS 2x10  NuStep L5 x6min72mLat pull down 10# 2x10 with minA to pull down  Seated rows 5lb 2x10 HS curls with band 2x10 yellow  LAQ no weight 2x10 Side step an tandem walking on airex in // bars Stair negotiation step to pattern  Alt 4 in box taps   04/29/22 NuStep L5 x99mns  STS 2x10  LAQ no weight 2x10 HS curls with band 2x10 Heel taps 6"  Calf stretch 30s  Calf raises 2x10 Lat pull down 10# 2x10 with minA to pull down Marching on airex holding out 1# WaTe 20 reps    04/24/22 2# seated LAQ, hip flex and abd 2 sets 10 Red tband trunk ext 2 sets 10 Red tband seated row and shld ext 2 sets 10 Nustep L 4 6 min UE and LE UBE L 2 2 min fwd / 2 back Lat pull 15 # 2 sets 10, help with the pull down ,focus on u[place] UE stretch Seated row 10 # 10 x Standing on airex marching 20 x 2 sets- CGA In // bars on airex with UE hip 3 way 10 x each Foam beam tandem fwd/back and laterally   04/09/22 Sit to stand with alternating LE adjusted for comfort Semirecumbent low trunk rotation HEP demo Seated thoracic rotation x5 reps HEP demo  PATIENT EDUCATION:  Education details: mechanics sit to stand,  Person educated: Patient and Child(ren) Education method: Explanation, DMedia planner and Handouts Education comprehension: verbalized understanding and returned demonstration  HOME EXERCISE PROGRAM: Access Code: [address]  [email or web address]: [email or web address] Date: 04/09/2022 Prepared by: [facility] ASSESSMENT:  CLINICAL IMPRESSION:  Patient continues to report B knee pain with bone on bone. Treatment focused on strengthening with low level weights. Fatigues near end of visit.   OBJECTIVE IMPAIRMENTS: Abnormal gait, decreased activity tolerance, decreased balance, decreased knowledge of use of DME, decreased mobility, difficulty walking, decreased ROM, decreased strength, increased edema, impaired flexibility, improper body mechanics, postural dysfunction, and pain.   ACTIVITY LIMITATIONS: bending, standing, squatting, sleeping, stairs, and locomotion level  PARTICIPATION LIMITATIONS: cleaning, driving, community activity, and yard work  PERSONAL FACTORS: Age, Past/current experiences, Time since onset of injury/illness/exacerbation, and 3+ comorbidities: knee OA, lumbar DDD and scoliosis  are also affecting patient's functional outcome.   REHAB POTENTIAL: Good  CLINICAL DECISION MAKING: Evolving/moderate complexity  EVALUATION COMPLEXITY: High   GOALS: Goals reviewed with patient? Yes  SHORT TERM GOALS: Target date: 06/10/2022  Pt will be independent with her initial HEP to improve LE strength and pain. Baseline: Goal status: met 05/06/22  2.  Pt will be able to demonstrate proper body mechanics with bed mobility to decrease strain on her low back. Baseline:  Goal status: met 05/06/22     LONG TERM GOALS: Target date: 08/19/2022   Pt will be able to complete 5x sit to stand in less than 15 sec with or without UE support, to reflect improvements in LE mobility and strength. Baseline: 23sec Goal status: INITIAL  2.  Pt will report atleast 40% improvement in her knee and low back pain from the start of PT. Baseline:  Goal status: INITIAL  3.  Pt will be able to ambulate with LRAD outdoors x2020fwithout assistance, to reflect improvement in her steadiness with ambulation. Baseline:  Goal status: ongoing  4.  Pt will be able to maintain SLS for up to 2 sec each LE, 2/3 trials. Baseline:  Goal status:  ongoing   PLAN:  PT FREQUENCY: 2x/week  PT DURATION: 12 weeks  PLANNED INTERVENTIONS: Therapeutic exercises, Therapeutic activity, Neuromuscular  re-education, Balance training, Gait training, Patient/Family education, Self Care, Joint mobilization, Aquatic Therapy, Electrical stimulation, Cryotherapy, Manual therapy, and Re-evaluation  PLAN FOR NEXT SESSION: need FOTO; BERG-set goal; progress LE strength, lumbar/thoracic flexibility and balance activity      Andris Baumann, PT 05/27/2022, 10:54 AM [place]. [place], [place], [address] Phone: [phone]   Fax:  [phone]  Patient Details  Name: Danielle Dean MRN: [id number] Date of Birth: September 14, 1928 Referring Provider:  Wenda Low, MD  Encounter Date: 05/27/2022   Marcelina Morel, DPT 05/27/2022, 10:54 AM  [place] [place], [place], [address] Phone: [phone]   Fax:  [phone]

## 2022-05-29 ENCOUNTER — Encounter: Payer: Self-pay | Admitting: Physical Therapy

## 2022-05-29 ENCOUNTER — Ambulatory Visit: Payer: Medicare Other | Admitting: Physical Therapy

## 2022-05-29 DIAGNOSIS — R262 Difficulty in walking, not elsewhere classified: Secondary | ICD-10-CM

## 2022-05-29 DIAGNOSIS — R2681 Unsteadiness on feet: Secondary | ICD-10-CM

## 2022-05-29 DIAGNOSIS — M25562 Pain in left knee: Secondary | ICD-10-CM | POA: Diagnosis not present

## 2022-05-29 DIAGNOSIS — R2689 Other abnormalities of gait and mobility: Secondary | ICD-10-CM

## 2022-05-29 DIAGNOSIS — M6281 Muscle weakness (generalized): Secondary | ICD-10-CM

## 2022-05-29 DIAGNOSIS — G8929 Other chronic pain: Secondary | ICD-10-CM

## 2022-05-29 NOTE — Therapy (Signed)
OUTPATIENT PHYSICAL THERAPY LOWER EXTREMITY TREATMENT  Progress Note Reporting Period 04/09/22 to 05/29/22  See note below for Objective Data and Assessment of Progress/Goals.      Patient Name: Danielle Dean MRN: [id number] DOB:1928/07/19, 88 y.o., female Today's Date: 05/29/2022   PT End of Session - 05/29/22 1016     Visit Number 10    Date for PT Re-Evaluation 07/02/22    PT Start Time 1013    PT Stop Time 1053    PT Time Calculation (min) 40 min    Activity Tolerance Patient tolerated treatment well    Behavior During Therapy WFL for tasks assessed/performed               Past Medical History:  Diagnosis Date   Arthritis    Callus    Cataract fragments in both eyes following surgery    Corns and callosities    Hx of hysterectomy    Hypertension    Osteoarthritis    Past Surgical History:  Procedure Laterality Date   ABDOMINAL HYSTERECTOMY     NASAL SEPTUM SURGERY     ROTATOR CUFF REPAIR Right    TOTAL SHOULDER ARTHROPLASTY     Patient Active Problem List   Diagnosis Date Noted   DDD (degenerative disc disease), thoracic 11/06/2020   Other idiopathic scoliosis, thoracolumbar region 11/06/2020   DDD (degenerative disc disease), lumbar 11/06/2020   Age-related osteoporosis without current pathological fracture 11/06/2020   Porokeratosis 11/26/2018   Corns and callosities 11/26/2018   History of right shoulder replacement 10/07/2016   Chronic pain of both knees 05/20/2016   Osteoarthritis of both hands 05/20/2016   Essential hypertension 05/20/2016   Environmental allergies 05/20/2016   Primary osteoarthritis of both knees 05/19/2016   Callus of foot 11/24/2012   Pain in joint, ankle and foot 09/15/2012    PCP: Wenda Low, MD  REFERRING PROVIDER: Bo Merino, MD  REFERRING DIAG: Primary OA Bil knees, DDD lumbar, poor balance  THERAPY DIAG:  Chronic pain of left knee  Chronic pain of right knee  Muscle weakness  (generalized)  Unsteadiness on feet  Difficulty in walking, not elsewhere classified  Other abnormalities of gait and mobility  Rationale for Evaluation and Treatment Rehabilitation  ONSET DATE: chronic issues-several years   SUBJECTIVE:   SUBJECTIVE STATEMENT: "I have this machine for my knees I use" My knees still always hurting   PERTINENT HISTORY: Rt shoulder replacement.  DDD Lumbar/cervical spine. scoliosis PAIN:  Are you having pain? Yes: NPRS scale: 4/10 Pain location: knee/legs Pain description: aching Aggravating factors: prolonged standing, walking; low back pain with standing activity Relieving factors: sitting, wearing knee braces  PRECAUTIONS: Fall  WEIGHT BEARING RESTRICTIONS: No  FALLS:  Has patient fallen in last 6 months? No  LIVING ENVIRONMENT: Lives with: lives with their family and lives alone Lives in: House/apartment Stairs: doesn't need to use steps in her home Has following equipment at home:  rollator , [facility]  OCCUPATION: retired   PLOF: Independent with basic ADLs  PATIENT GOALS: decrease pain in knees/back; improve steadiness    OBJECTIVE:   DIAGNOSTIC FINDINGS:   PATIENT SURVEYS:  FOTO: next visit  COGNITION: Overall cognitive status: Within functional limits for tasks assessed     SENSATION: Not tested denies NT  EDEMA:  None noted at eval, will sometimes have swelling in Lt ankle   MUSCLE LENGTH: Hamstrings: Right 90/90 45 deg ; Left 90/90 45 deg   POSTURE: decreased lumbar lordosis and weight shift left  trunk lateral shift Lt   PALPATION: Tenderness Rt glute max  LOWER EXTREMITY ROM:  Passive ROM Right eval Left eval  Hip flexion    Hip extension    Hip abduction    Hip adduction    Hip internal rotation    Hip external rotation    Knee flexion WNL WNL  Knee extension WNL WNL  Ankle dorsiflexion    Ankle plantarflexion    Ankle inversion    Ankle eversion     (Blank rows = not tested)   LOWER  EXTREMITY MMT:  MMT Right eval Left eval  Hip flexion 3+/pain 4  Hip extension 3 3  Hip abduction 3 3  Hip adduction    Hip internal rotation    Hip external rotation    Knee flexion 3+ 3+  Knee extension 3+ 3+/pain  Ankle dorsiflexion 4 4  Ankle plantarflexion    Ankle inversion    Ankle eversion     (Blank rows = not tested) LUMBAR ROM:   AROM eval  Flexion WNL  Extension Unable to reach neutral  Right lateral flexion   Left lateral flexion   Right rotation 75% limited  Left rotation 75% limited   (Blank rows = not tested)  ] LOWER EXTREMITY SPECIAL TESTS:    FUNCTIONAL TESTS:  5 times sit to stand: 23 sec, weight shift Lt, UE assist  GAIT: Distance walked: 20 Assistive device utilized: None Level of assistance: Complete Independence Comments: minimal hip extension Lt, Lt lateral trunk shift, increased weight through Lt LE   TODAY'S TREATMENT                                       05/29/22 NuStep L5 x 6 minutes Functional re-assessment for PN Forward flexion over Physioball x 6 Seated rotation to R, walk hands out and back x 5, repeat to L Lat pulls 10#, 2 x 10 reps Trunk ext against Black Tband, 2 x 10 reps Step ups onto Airex pad, 2 x 5 with each leg, no UE support B side stepping up onto Airex, then off, 5 x each way. Gait assessment- decreased stance on L, antalgic on L, slow, but stable.   05/27/22 NuStep L5 x60mns  STS with ball push out x10, x10 without ball  Walking on beam Calf stretch 30s  Seated rows 5# 2x10 Lat pull downs 10# 2x10 Heel taps 4" Up and down stair case 2x with support on rails  LAQ 1.5# 2x10 HS curls greenTB 2x10  05/22/22 NuStep L3 x668ms Seated rows 5# 2x10 Lat pull downs 10# 2x10 blackTB ext 2x10 Standing rows and ext with red band 2x10 LAQ with 1.5# 2x10 Standing marches 1.5# 20 reps Hip abd 1.5# 2x10  05/20/22 NuStep L3 x 6 minutes Performed supine lower body ROM over physioball, rolling B KTC and LTR with  assist. Practiced pelvic mobilization, patient with great difficulty moving from lower body, overuses upper body.-Tried in Supine, then in sitting with Up body stabilized-still required mod TC.  05/13/22 NuStep L5 x7m3m  STS on airex 2x5  LAQ 2x10 1#  HS curls green 2x10 Hip abd green 2x10  4" heel taps with 1# 2x10 CGA needed Lat pull downs 10# 2x10 Seated rows 5# 2x10 Foam beam in // bars tandem fwd/back, side stepping Standing ext yellow TB 2x10   05/06/22 Nustep L 5 6mi6mesisted gait 20# 4 x fwd  and 3 x each side- minA with increased knee pain esp comeing ack with left leading and decreased stability STS 2 sets 5 without UE with feet on foam mat- decreased eccentric LAQ, hip flex and hip abd 2 sets 10 1 # ( decreased from 2# as sh ehad decreased pain after using 2#) HS curl yellow tband 2 sets 10 Lat pull down 10# 2x10 with minA to pull down Seated rows 10lb 2x10 Foam beam in // bars tandem fwd/back, side stepping and marching  05/01/22 STS 2x10  NuStep L5 x14mns  Lat pull down 10# 2x10 with minA to pull down Seated rows 5lb 2x10 HS curls with band 2x10 yellow  LAQ no weight 2x10 Side step an tandem walking on airex in // bars Stair negotiation step to pattern  Alt 4 in box taps   04/29/22 NuStep L5 x633ms  STS 2x10  LAQ no weight 2x10 HS curls with band 2x10 Heel taps 6"  Calf stretch 30s  Calf raises 2x10 Lat pull down 10# 2x10 with minA to pull down Marching on airex holding out 1# WaTe 20 reps  04/24/22 2# seated LAQ, hip flex and abd 2 sets 10 Red tband trunk ext 2 sets 10 Red tband seated row and shld ext 2 sets 10 Nustep L 4 6 min UE and LE UBE L 2 2 min fwd / 2 back Lat pull 15 # 2 sets 10, help with the pull down ,focus on [place] UE stretch Seated row 10 # 10 x Standing on airex marching 20 x 2 sets- CGA In // bars on airex with UE hip 3 way 10 x each Foam beam tandem fwd/back and laterally  04/09/22 Sit to stand with alternating LE adjusted  for comfort Semirecumbent low trunk rotation HEP demo Seated thoracic rotation x5 reps HEP demo  PATIENT EDUCATION:  Education details: mechanics sit to stand,  Person educated: Patient and Child(ren) Education method: Explanation, DeMedia plannerand Handouts Education comprehension: verbalized understanding and returned demonstration  HOME EXERCISE PROGRAM: Access Code: [email or web address]: [email or web address] Date: 04/09/2022 Prepared by: [facility]ESS[facility]:  CLINICAL IMPRESSION: Patient's functional status re-assessed for progress note. She feels like she is benefiting from PT. Needs her HEP updated, started to assess possible exercises to add. She tolerated all exercises well, enjoys posture exercises and she states she will not do exercises at home that are too hard. Need to ID appropriately challenging activities.  OBJECTIVE IMPAIRMENTS: Abnormal gait, decreased activity tolerance, decreased balance, decreased knowledge of use of DME, decreased mobility, difficulty walking, decreased ROM, decreased strength, increased edema, impaired flexibility, improper body mechanics, postural dysfunction, and pain.   ACTIVITY LIMITATIONS: bending, standing, squatting, sleeping, stairs, and locomotion level  PARTICIPATION LIMITATIONS: cleaning, driving, community activity, and yard work  PERSONAL FACTORS: Age, Past/current experiences, Time since onset of injury/illness/exacerbation, and 3+ comorbidities: knee OA, lumbar DDD and scoliosis  are also affecting patient's functional outcome.   REHAB POTENTIAL: Good  CLINICAL DECISION MAKING: Evolving/moderate complexity  EVALUATION COMPLEXITY: High   GOALS: Goals reviewed with patient? Yes  SHORT TERM GOALS: Target date: 06/12/2022  Pt will be independent with her initial HEP to improve LE strength and pain. Baseline: Goal status: met 05/06/22  2.  Pt will be able  to demonstrate proper body mechanics with bed mobility to decrease strain on her low back. Baseline:  Goal status: met 05/06/22     LONG TERM GOALS: Target date: 08/21/2022  Pt will be able to complete 5x sit to stand in less than 15 sec with or without UE support, to reflect improvements in LE mobility and strength. Baseline: 23sec, 12/14-16.7 Goal status: ongoing  2.  Pt will report atleast 40% improvement in her knee and low back pain from the start of PT. Baseline: 12/14-Back pain is slightly better, knee pain remains. Goal status: ongoing  3.  Pt will be able to ambulate with LRAD outdoors x271f without assistance, to reflect improvement in her steadiness with ambulation. Baseline: 12/14 uses RW, walks outdoors x 20 minutes. Goal status: met  4.  Pt will be able to maintain SLS for up to 2 sec each LE, 2/3 trials. Baseline: 12/14-Stood on R x 3 sec on one attempt. All others < 2 sec on each leg. Goal status: ongoing   PLAN:  PT FREQUENCY: 2x/week  PT DURATION: 12 weeks  PLANNED INTERVENTIONS: Therapeutic exercises, Therapeutic activity, Neuromuscular re-education, Balance training, Gait training, Patient/Family education, Self Care, Joint mobilization, Aquatic Therapy, Electrical stimulation, Cryotherapy, Manual therapy, and Re-evaluation  PLAN FOR NEXT SESSION: need FOTO; BERG-set goal; progress LE strength, lumbar/thoracic flexibility and balance activity   Patient Details  Name: Danielle ABUNDISMRN: [id number] of Birth: [birth year]Referring Provider:  HWenda Low MD  Encounter Date: 05/29/2022  SMarcelina Morel DPT 05/29/2022, 10:54 AM  [place] [address]Phone: [phone]  Fax:  [phone]

## 2022-06-03 ENCOUNTER — Ambulatory Visit: Payer: Medicare Other | Admitting: Physical Therapy

## 2022-06-03 ENCOUNTER — Encounter: Payer: Self-pay | Admitting: Physical Therapy

## 2022-06-03 DIAGNOSIS — R2689 Other abnormalities of gait and mobility: Secondary | ICD-10-CM

## 2022-06-03 DIAGNOSIS — M25562 Pain in left knee: Secondary | ICD-10-CM | POA: Diagnosis not present

## 2022-06-03 DIAGNOSIS — G8929 Other chronic pain: Secondary | ICD-10-CM

## 2022-06-03 DIAGNOSIS — R2681 Unsteadiness on feet: Secondary | ICD-10-CM

## 2022-06-03 DIAGNOSIS — R262 Difficulty in walking, not elsewhere classified: Secondary | ICD-10-CM

## 2022-06-03 DIAGNOSIS — M6281 Muscle weakness (generalized): Secondary | ICD-10-CM

## 2022-06-03 NOTE — Therapy (Signed)
OUTPATIENT PHYSICAL THERAPY LOWER EXTREMITY TREATMENT  Progress Note Reporting Period 04/09/22 to 05/29/22  See note below for Objective Data and Assessment of Progress/Goals.      Patient Name: Danielle Dean MRN: [id number] DOB:02-27-1929, 88 y.o., female Today's Date: 06/03/2022   PT End of Session - 06/03/22 1021     Visit Number 11    Date for PT Re-Evaluation 07/02/22    PT Start Time 1014    PT Stop Time 1054    PT Time Calculation (min) 40 min    Activity Tolerance Patient tolerated treatment well    Behavior During Therapy WFL for tasks assessed/performed                Past Medical History:  Diagnosis Date   Arthritis    Callus    Cataract fragments in both eyes following surgery    Corns and callosities    Hx of hysterectomy    Hypertension    Osteoarthritis    Past Surgical History:  Procedure Laterality Date   ABDOMINAL HYSTERECTOMY     NASAL SEPTUM SURGERY     ROTATOR CUFF REPAIR Right    TOTAL SHOULDER ARTHROPLASTY     Patient Active Problem List   Diagnosis Date Noted   DDD (degenerative disc disease), thoracic 11/06/2020   Other idiopathic scoliosis, thoracolumbar region 11/06/2020   DDD (degenerative disc disease), lumbar 11/06/2020   Age-related osteoporosis without current pathological fracture 11/06/2020   Porokeratosis 11/26/2018   Corns and callosities 11/26/2018   History of right shoulder replacement 10/07/2016   Chronic pain of both knees 05/20/2016   Osteoarthritis of both hands 05/20/2016   Essential hypertension 05/20/2016   Environmental allergies 05/20/2016   Primary osteoarthritis of both knees 05/19/2016   Callus of foot 11/24/2012   Pain in joint, ankle and foot 09/15/2012    PCP: Wenda Low, MD  REFERRING PROVIDER: Bo Merino, MD  REFERRING DIAG: Primary OA Bil knees, DDD lumbar, poor balance  THERAPY DIAG:  Chronic pain of left knee  Chronic pain of right knee  Muscle weakness  (generalized)  Unsteadiness on feet  Difficulty in walking, not elsewhere classified  Other abnormalities of gait and mobility  Rationale for Evaluation and Treatment Rehabilitation  ONSET DATE: chronic issues-several years   SUBJECTIVE:   SUBJECTIVE STATEMENT: Patient reports that the cold weather adds stiffness to her knee pain.  PERTINENT HISTORY: Rt shoulder replacement.  DDD Lumbar/cervical spine. scoliosis PAIN:  Are you having pain? Yes: NPRS scale: 4/10 Pain location: knee/legs Pain description: aching Aggravating factors: prolonged standing, walking; low back pain with standing activity Relieving factors: sitting, wearing knee braces  PRECAUTIONS: Fall  WEIGHT BEARING RESTRICTIONS: No  FALLS:  Has patient fallen in last 6 months? No  LIVING ENVIRONMENT: Lives with: lives with their family and lives alone Lives in: House/apartment Stairs: doesn't need to use steps in her home Has following equipment at home:  rollator , [facility]  OCCUPATION: retired   PLOF: Independent with basic ADLs  PATIENT GOALS: decrease pain in knees/back; improve steadiness    OBJECTIVE:   DIAGNOSTIC FINDINGS:   PATIENT SURVEYS:  FOTO: next visit  COGNITION: Overall cognitive status: Within functional limits for tasks assessed     SENSATION: Not tested denies NT  EDEMA:  None noted at eval, will sometimes have swelling in Lt ankle   MUSCLE LENGTH: Hamstrings: Right 90/90 45 deg ; Left 90/90 45 deg   POSTURE: decreased lumbar lordosis and weight shift left trunk lateral  shift Lt   PALPATION: Tenderness Rt glute max  LOWER EXTREMITY ROM:  Passive ROM Right eval Left eval  Hip flexion    Hip extension    Hip abduction    Hip adduction    Hip internal rotation    Hip external rotation    Knee flexion WNL WNL  Knee extension WNL WNL  Ankle dorsiflexion    Ankle plantarflexion    Ankle inversion    Ankle eversion     (Blank rows = not tested)   LOWER  EXTREMITY MMT:  MMT Right eval Left eval  Hip flexion 3+/pain 4  Hip extension 3 3  Hip abduction 3 3  Hip adduction    Hip internal rotation    Hip external rotation    Knee flexion 3+ 3+  Knee extension 3+ 3+/pain  Ankle dorsiflexion 4 4  Ankle plantarflexion    Ankle inversion    Ankle eversion     (Blank rows = not tested) LUMBAR ROM:   AROM eval  Flexion WNL  Extension Unable to reach neutral  Right lateral flexion   Left lateral flexion   Right rotation 75% limited  Left rotation 75% limited   (Blank rows = not tested)  ] LOWER EXTREMITY SPECIAL TESTS:    FUNCTIONAL TESTS:  5 times sit to stand: 23 sec, weight shift Lt, UE assist  GAIT: Distance walked: 20 Assistive device utilized: None Level of assistance: Complete Independence Comments: minimal hip extension Lt, Lt lateral trunk shift, increased weight through Lt LE   TODAY'S TREATMENT                                       06/03/22 NuStep L4 x 6 minutes Seated hamstring stretch 3 x 15 sec each leg Long kicks- noted click with L knee ext 10 reps each Seated march 10 reps each Heel raise/toe raise 10 reps Clamshells against red tband 10 reps Flexor stretch over ball, 3 x 10 sec forward, and to each side Quick steps side to side, then forward and back, then changing direction upon therapist's command, BUE support. Mild unsteadiness.   05/29/22 NuStep L5 x 6 minutes Functional re-assessment for PN Forward flexion over Physioball x 6 Seated rotation to R, walk hands out and back x 5, repeat to L Lat pulls 10#, 2 x 10 reps Trunk ext against Black Tband, 2 x 10 reps Step ups onto Airex pad, 2 x 5 with each leg, no UE support B side stepping up onto Airex, then off, 5 x each way. Gait assessment- decreased stance on L, antalgic on L, slow, but stable.   05/27/22 NuStep L5 x14mns  STS with ball push out x10, x10 without ball  Walking on beam Calf stretch 30s  Seated rows 5# 2x10 Lat pull downs  10# 2x10 Heel taps 4" Up and down stair case 2x with support on rails  LAQ 1.5# 2x10 HS curls greenTB 2x10  05/22/22 NuStep L3 x628ms Seated rows 5# 2x10 Lat pull downs 10# 2x10 blackTB ext 2x10 Standing rows and ext with red band 2x10 LAQ with 1.5# 2x10 Standing marches 1.5# 20 reps Hip abd 1.5# 2x10  05/20/22 NuStep L3 x 6 minutes Performed supine lower body ROM over physioball, rolling B KTC and LTR with assist. Practiced pelvic mobilization, patient with great difficulty moving from lower body, overuses upper body.-Tried in Supine, then in sitting with  Up body stabilized-still required mod TC.  05/13/22 NuStep L5 x44mns  STS on airex 2x5  LAQ 2x10 1#  HS curls green 2x10 Hip abd green 2x10  4" heel taps with 1# 2x10 CGA needed Lat pull downs 10# 2x10 Seated rows 5# 2x10 Foam beam in // bars tandem fwd/back, side stepping Standing ext yellow TB 2x10   05/06/22 Nustep L 5 645m Resisted gait 20# 4 x fwd and 3 x each side- minA with increased knee pain esp comeing ack with left leading and decreased stability STS 2 sets 5 without UE with feet on foam mat- decreased eccentric LAQ, hip flex and hip abd 2 sets 10 1 # ( decreased from 2# as sh ehad decreased pain after using 2#) HS curl yellow tband 2 sets 10 Lat pull down 10# 2x10 with minA to pull down Seated rows 10lb 2x10 Foam beam in // bars tandem fwd/back, side stepping and marching  05/01/22 STS 2x10  NuStep L5 x6m52m  Lat pull down 10# 2x10 with minA to pull down Seated rows 5lb 2x10 HS curls with band 2x10 yellow  LAQ no weight 2x10 Side step an tandem walking on airex in // bars Stair negotiation step to pattern  Alt 4 in box taps   04/29/22 NuStep L5 x6mi25m STS 2x10  LAQ no weight 2x10 HS curls with band 2x10 Heel taps 6"  Calf stretch 30s  Calf raises 2x10 Lat pull down 10# 2x10 with minA to pull down Marching on airex holding out 1# WaTe 20 reps  04/24/22 2# seated LAQ, hip flex and abd 2 sets  10 Red tband trunk ext 2 sets 10 Red tband seated row and shld ext 2 sets 10 Nustep L 4 6 min UE and LE UBE L 2 2 min fwd / 2 back Lat pull 15 # 2 sets 10, help with the pull down ,focus on us [place] UE stretch Seated row 10 # 10 x Standing on airex marching 20 x 2 sets- CGA In // bars on airex with UE hip 3 way 10 x each Foam beam tandem fwd/back and laterally  04/09/22 Sit to stand with alternating LE adjusted for comfort Semirecumbent low trunk rotation HEP demo Seated thoracic rotation x5 reps HEP demo  PATIENT EDUCATION:  Education details: mechanics sit to stand,  Person educated: Patient and Child(ren) Education method: Explanation, DemoMedia plannerd Handouts Education comprehension: verbalized understanding and returned demonstration  HOME EXERCISE PROGRAM: Access Code: [email or web address]: [email or web address] Date: 04/09/2022 Prepared by: [facility]SESSMENT:  CLINICAL IMPRESSION: Patient reports no big changes. She feels more stiff as the weather gets colder. Treatment focused on updating HEP to address stiffness, strength, and balance.  OBJECTIVE IMPAIRMENTS: Abnormal gait, decreased activity tolerance, decreased balance, decreased knowledge of use of DME, decreased mobility, difficulty walking, decreased ROM, decreased strength, increased edema, impaired flexibility, improper body mechanics, postural dysfunction, and pain.   ACTIVITY LIMITATIONS: bending, standing, squatting, sleeping, stairs, and locomotion level  PARTICIPATION LIMITATIONS: cleaning, driving, community activity, and yard work  PERSONAL FACTORS: Age, Past/current experiences, Time since onset of injury/illness/exacerbation, and 3+ comorbidities: knee OA, lumbar DDD and scoliosis  are also affecting patient's functional outcome.   REHAB POTENTIAL: Good  CLINICAL DECISION MAKING: Evolving/moderate complexity  EVALUATION  COMPLEXITY: High   GOALS: Goals reviewed with patient? Yes  SHORT TERM GOALS: Target date: 06/17/2022  Pt will be independent with her initial HEP to improve LE strength and  pain. Baseline: Goal status: met 05/06/22  2.  Pt will be able to demonstrate proper body mechanics with bed mobility to decrease strain on her low back. Baseline:  Goal status: met 05/06/22     LONG TERM GOALS: Target date: 08/26/2022   Pt will be able to complete 5x sit to stand in less than 15 sec with or without UE support, to reflect improvements in LE mobility and strength. Baseline: 23sec, 12/14-16.7 Goal status: ongoing  2.  Pt will report atleast 40% improvement in her knee and low back pain from the start of PT. Baseline: 12/14-Back pain is slightly better, knee pain remains. Goal status: ongoing  3.  Pt will be able to ambulate with LRAD outdoors x220f without assistance, to reflect improvement in her steadiness with ambulation. Baseline: 12/14 uses RW, walks outdoors x 20 minutes. Goal status: met  4.  Pt will be able to maintain SLS for up to 2 sec each LE, 2/3 trials. Baseline: 12/14-Stood on R x 3 sec on one attempt. All others < 2 sec on each leg. Goal status: ongoing   PLAN:  PT FREQUENCY: 2x/week  PT DURATION: 12 weeks  PLANNED INTERVENTIONS: Therapeutic exercises, Therapeutic activity, Neuromuscular re-education, Balance training, Gait training, Patient/Family education, Self Care, Joint mobilization, Aquatic Therapy, Electrical stimulation, Cryotherapy, Manual therapy, and Re-evaluation  PLAN FOR NEXT SESSION: need FOTO; BERG-set goal; progress LE strength, lumbar/thoracic flexibility and balance activity   Patient Details  Name: KPHOUA HOADLEYMRN: [id number] of Birth: [birth year]Referring Provider:  HWenda Low MD  Encounter Date: 06/03/2022  SMarcelina Morel DPT 06/03/2022, 10:55 AM  [place] [address]Phone: [phone]  Fax:  [phone]

## 2022-06-05 ENCOUNTER — Ambulatory Visit: Payer: Medicare Other | Admitting: Physical Therapy

## 2022-06-05 DIAGNOSIS — R2681 Unsteadiness on feet: Secondary | ICD-10-CM

## 2022-06-05 DIAGNOSIS — M6281 Muscle weakness (generalized): Secondary | ICD-10-CM

## 2022-06-05 DIAGNOSIS — G8929 Other chronic pain: Secondary | ICD-10-CM

## 2022-06-05 DIAGNOSIS — M25562 Pain in left knee: Secondary | ICD-10-CM | POA: Diagnosis not present

## 2022-06-05 NOTE — Progress Notes (Signed)
Office Visit Note  Patient: Danielle Dean             Date of Birth: September 09, 1928           MRN: [id number]             PCP: Georgann Housekeeper, MD Referring: Georgann Housekeeper, MD Visit Date: 06/19/2022 Occupation: @GUAROCC @  Subjective:  Pain in knee joints  History of Present Illness: Danielle Dean is a 88 y.o. female with history of osteoarthritis, degenerative disc disease and osteoporosis.  She states she has been having increased discomfort in her knee joints especially her left knee joint.  She had good relief to cortisone injection about 3 months ago.  The pain has returned normal.  She takes Tylenol as needed for discomfort.  She continues to have some pain and stiffness in her hands, left shoulder and her hips.  She has lower back pain which is constant.  Activities of Daily Living:  Patient reports morning stiffness for all day. Patient Reports nocturnal pain.  Difficulty dressing/grooming: Denies Difficulty climbing stairs: Reports Difficulty getting out of chair: Denies Difficulty using hands for taps, buttons, cutlery, and/or writing: Reports  Review of Systems  Constitutional:  Negative for fatigue.  HENT:  Negative for mouth sores and mouth dryness.   Eyes:  Positive for dryness.  Respiratory:  Negative for shortness of breath.   Cardiovascular:  Negative for chest pain and palpitations.  Gastrointestinal:  Negative for blood in stool, constipation and diarrhea.  Endocrine: Negative for increased urination.  Genitourinary:  Negative for involuntary urination.  Musculoskeletal:  Positive for joint pain, joint pain, myalgias, morning stiffness, muscle tenderness and myalgias. Negative for gait problem, joint swelling and muscle weakness.  Skin:  Negative for color change, rash, hair loss and sensitivity to sunlight.  Allergic/Immunologic: Negative for susceptible to infections.  Neurological:  Positive for numbness. Negative for dizziness and headaches.   Hematological:  Negative for swollen glands.  Psychiatric/Behavioral:  Negative for depressed mood and sleep disturbance. The patient is not nervous/anxious.     PMFS History:  Patient Active Problem List   Diagnosis Date Noted   DDD (degenerative disc disease), thoracic 11/06/2020   Other idiopathic scoliosis, thoracolumbar region 11/06/2020   DDD (degenerative disc disease), lumbar 11/06/2020   Age-related osteoporosis without current pathological fracture 11/06/2020   Porokeratosis 11/26/2018   Corns and callosities 11/26/2018   History of right shoulder replacement 10/07/2016   Chronic pain of both knees 05/20/2016   Osteoarthritis of both hands 05/20/2016   Essential hypertension 05/20/2016   Environmental allergies 05/20/2016   Primary osteoarthritis of both knees 05/19/2016   Callus of foot 11/24/2012   Pain in joint, ankle and foot 09/15/2012    Past Medical History:  Diagnosis Date   Arthritis    Callus    Cataract fragments in both eyes following surgery    Corns and callosities    Hx of hysterectomy    Hypertension    Osteoarthritis     Family History  Problem Relation Age of Onset   Arthritis Mother    Diabetes Father    Past Surgical History:  Procedure Laterality Date   ABDOMINAL HYSTERECTOMY     NASAL SEPTUM SURGERY     ROTATOR CUFF REPAIR Right    TOTAL SHOULDER ARTHROPLASTY     Social History   Social History Narrative   Not on file   Immunization History  Administered Date(s) Administered   PFIZER(Purple Top)SARS-COV-2 Vaccination 07/06/2019, 07/27/2019, 03/10/2020  Objective: Vital Signs: BP (!) 149/71 (BP Location: Left Arm, Patient Position: Sitting, Cuff Size: Normal)   Pulse 73   Ht 5' (1.524 m)   Wt 104 lb 9.6 oz (47.4 kg)   BMI 20.43 kg/m    Physical Exam Vitals and nursing note reviewed.  Constitutional:      Appearance: She is well-developed.  HENT:     Head: Normocephalic and atraumatic.  Eyes:      Conjunctiva/sclera: Conjunctivae normal.  Cardiovascular:     Rate and Rhythm: Normal rate and regular rhythm.     Heart sounds: Normal heart sounds.  Pulmonary:     Effort: Pulmonary effort is normal.     Breath sounds: Normal breath sounds.  Abdominal:     General: Bowel sounds are normal.     Palpations: Abdomen is soft.  Musculoskeletal:     Cervical back: Normal range of motion.  Lymphadenopathy:     Cervical: No cervical adenopathy.  Skin:    General: Skin is warm and dry.     Capillary Refill: Capillary refill takes less than 2 seconds.  Neurological:     Mental Status: She is alert and oriented to person, place, and time.  Psychiatric:        Behavior: Behavior normal.      Musculoskeletal Exam: She had limited lateral rotation of the cervical spine.  She had thoracic kyphosis.  There was no tenderness over thoracic or lumbar spine.  She had discomfort range of motion lumbar spine.  Right shoulder joint abduction was limited to 110 degrees.  Left shoulder joints in good range of motion without discomfort.  Elbow joints and wrist joints with good range of motion.  She had bilateral PIP DIP and CMC thickening without synovitis.  Hip joints in good range of motion.  Knee joints were in good range of motion.  She had no warmth on palpation of her left knee joint but mild effusion was noted.  Right knee joint was in good range of motion without any warmth swelling or effusion.  There was no tenderness over ankles or MTPs.  CDAI Exam: CDAI Score: -- Patient Global: --; Provider Global: -- Swollen: --; Tender: -- Joint Exam 06/19/2022   No joint exam has been documented for this visit   There is currently no information documented on the homunculus. Go to the Rheumatology activity and complete the homunculus joint exam.  Investigation: No additional findings.  Imaging: No results found.  Recent Labs: Lab Results  Component Value Date   WBC 7.5 01/07/2022   HGB 11.2 (L)  01/07/2022   PLT 219 01/07/2022   NA 130 (L) 01/07/2022   K 5.1 01/07/2022   CL 98 01/07/2022   CO2 26 01/07/2022   GLUCOSE 92 01/07/2022   BUN 21 01/07/2022   CREATININE 0.69 01/07/2022   BILITOT 0.5 01/07/2022   ALKPHOS 50 12/20/2020   AST 15 01/07/2022   ALT 13 01/07/2022   PROT 6.3 01/07/2022   ALBUMIN 3.6 12/20/2020   CALCIUM 8.9 01/07/2022   GFRAA 81 06/14/2020    Speciality Comments: Prolia: 06/27/20, 01/16/21,07/18/2021,01/16/22  Procedures:  Large Joint Inj: L knee on 06/19/2022 12:08 PM Indications: pain Details: 27 G 1.5 in needle, medial approach  Arthrogram: No  Medications: 40 mg triamcinolone acetonide 40 MG/ML; 1.5 mL lidocaine 1 % Aspirate: 0 mL Outcome: tolerated well, no immediate complications Procedure, treatment alternatives, risks and benefits explained, specific risks discussed. Consent was given by the patient. Immediately prior to procedure  a time out was called to verify the correct patient, procedure, equipment, support staff and site/side marked as required. Patient was prepped and draped in the usual sterile fashion.     Allergies: Actonel [risedronate], Fosamax [alendronate], and Lisinopril   Assessment / Plan:     Visit Diagnoses: Primary osteoarthritis of both knees - she has severe osteoarthritis of bilateral knee joints and severe chondromalacia patella.  She continues to have pain and discomfort in the bilateral knee joints.  She had good response to previous cortisone injection.  She states the left knee joint pain has come back.  She would like to have her left knee joint injected today.  After informed consent was obtained and side effects were discussed the left knee joint was injected as described above.  She tolerated the procedure well.  Postprocedure instructions were given.  Rupture of left biceps tendon, sequela-she had good range of motion of the left shoulder joint.  She had no discomfort on palpation.  History of right shoulder  replacement-she has limited range of motion of her right shoulder joint.  Primary osteoarthritis of both hands-she has bilateral PIP and DIP thickening and CMC prominence consistent with osteoarthritis.  Joint protection was discussed.  DDD (degenerative disc disease), thoracic - she has thoracic kyphosis and height loss.  DDD (degenerative disc disease), lumbar-she has chronic discomfort in her lower back.  Other idiopathic scoliosis, thoracolumbar region  Poor balance-she was advised to go to physical therapy.  Age-related osteoporosis without current pathological fracture - May 04, 2020 DEXA: 1/3 Left distal radius BMD 0.421 with T-score -4.6. Prolia was a started on June 27, 2020. last prolia inj: 01/16/2022.  Use of calcium rich diet and vitamin D was discussed.  Patient wants to hold off on DEXA scan for follow-up visit.  Hyponatremia  History of hypertension-blood pressure was mildly elevated at 149/71 today.  She was advised to monitor blood pressure closely and follow-up with the PCP.  Orders: Orders Placed This Encounter  Procedures   Large Joint Inj   No orders of the defined types were placed in this encounter.    Follow-Up Instructions: Return in about 3 months (around 09/18/2022) for Osteoarthritis.   Pollyann Savoy, MD  Note - This record has been created using Animal nutritionist.  Chart creation errors have been sought, but may not always  have been located. Such creation errors do not reflect on  the standard of medical care.

## 2022-06-05 NOTE — Therapy (Signed)
OUTPATIENT PHYSICAL THERAPY LOWER EXTREMITY TREATMENT  Progress Note     Patient Name: Danielle Dean MRN: [id number] DOB:March 13, 1929, 88 y.o., female Today's Date: 06/05/2022   PT End of Session - 06/05/22 1007     Visit Number 12    Number of Visits 24    Date for PT Re-Evaluation 07/02/22    Authorization Type MCR and MCD    Authorization Time Period 04/09/22 to 07/02/22    PT Start Time 1015    PT Stop Time 1100    PT Time Calculation (min) 45 min                Past Medical History:  Diagnosis Date   Arthritis    Callus    Cataract fragments in both eyes following surgery    Corns and callosities    Hx of hysterectomy    Hypertension    Osteoarthritis    Past Surgical History:  Procedure Laterality Date   ABDOMINAL HYSTERECTOMY     NASAL SEPTUM SURGERY     ROTATOR CUFF REPAIR Right    TOTAL SHOULDER ARTHROPLASTY     Patient Active Problem List   Diagnosis Date Noted   DDD (degenerative disc disease), thoracic 11/06/2020   Other idiopathic scoliosis, thoracolumbar region 11/06/2020   DDD (degenerative disc disease), lumbar 11/06/2020   Age-related osteoporosis without current pathological fracture 11/06/2020   Porokeratosis 11/26/2018   Corns and callosities 11/26/2018   History of right shoulder replacement 10/07/2016   Chronic pain of both knees 05/20/2016   Osteoarthritis of both hands 05/20/2016   Essential hypertension 05/20/2016   Environmental allergies 05/20/2016   Primary osteoarthritis of both knees 05/19/2016   Callus of foot 11/24/2012   Pain in joint, ankle and foot 09/15/2012    PCP: Wenda Low, MD  REFERRING PROVIDER: Bo Merino, MD  REFERRING DIAG: Primary OA Bil knees, DDD lumbar, poor balance  THERAPY DIAG:  Chronic pain of left knee  Chronic pain of right knee  Muscle weakness (generalized)  Unsteadiness on feet  Rationale for Evaluation and Treatment Rehabilitation  ONSET DATE: chronic issues-several  years   SUBJECTIVE:   SUBJECTIVE STATEMENT: Patient reports that the cold weather adds stiffness to her knee pain. No changes  PERTINENT HISTORY: Rt shoulder replacement.  DDD Lumbar/cervical spine. scoliosis PAIN:  Are you having pain? Yes: NPRS scale: 4/10 Pain location: knee/legs Pain description: aching Aggravating factors: prolonged standing, walking; low back pain with standing activity Relieving factors: sitting, wearing knee braces  PRECAUTIONS: Fall  WEIGHT BEARING RESTRICTIONS: No  FALLS:  Has patient fallen in last 6 months? No  LIVING ENVIRONMENT: Lives with: lives with their family and lives alone Lives in: House/apartment Stairs: doesn't need to use steps in her home Has following equipment at home:  rollator , [employer]  OCCUPATION: retired   PLOF: Independent with basic ADLs  PATIENT GOALS: decrease pain in knees/back; improve steadiness    OBJECTIVE:   DIAGNOSTIC FINDINGS:   PATIENT SURVEYS:  FOTO: next visit  COGNITION: Overall cognitive status: Within functional limits for tasks assessed     SENSATION: Not tested denies NT  EDEMA:  None noted at eval, will sometimes have swelling in Lt ankle   MUSCLE LENGTH: Hamstrings: Right 90/90 45 deg ; Left 90/90 45 deg   POSTURE: decreased lumbar lordosis and weight shift left trunk lateral shift Lt   PALPATION: Tenderness Rt glute max  LOWER EXTREMITY ROM:  Passive ROM Right eval Left eval  Hip flexion  Hip extension    Hip abduction    Hip adduction    Hip internal rotation    Hip external rotation    Knee flexion WNL WNL  Knee extension WNL WNL  Ankle dorsiflexion    Ankle plantarflexion    Ankle inversion    Ankle eversion     (Blank rows = not tested)   LOWER EXTREMITY MMT:  MMT Right eval Left eval  Hip flexion 3+/pain 4  Hip extension 3 3  Hip abduction 3 3  Hip adduction    Hip internal rotation    Hip external rotation    Knee flexion 3+ 3+  Knee extension 3+  3+/pain  Ankle dorsiflexion 4 4  Ankle plantarflexion    Ankle inversion    Ankle eversion     (Blank rows = not tested) LUMBAR ROM:   AROM eval  Flexion WNL  Extension Unable to reach neutral  Right lateral flexion   Left lateral flexion   Right rotation 75% limited  Left rotation 75% limited   (Blank rows = not tested)  ] LOWER EXTREMITY SPECIAL TESTS:    FUNCTIONAL TESTS:  5 times sit to stand: 23 sec, weight shift Lt, UE assist  GAIT: Distance walked: 20 Assistive device utilized: None Level of assistance: Complete Independence Comments: minimal hip extension Lt, Lt lateral trunk shift, increased weight through Lt LE   TODAY'S TREATMENT                                        06/05/22 Nustep L 4 6 min Lat pulls and Seated rows 10#, 2 x 10 reps Trunk ext against Black Tband, 2 x 10 reps 1.5# LE seated LAQ,hip flex and abd 2 sets 10 Standing on Airex 1.5# HHA marching, hip flex,ext,abd heel raises and mini squats 10 each UBE L 1 2 min fwd/2 min backward Resisted giat 4 way 3 x each 20#    06/03/22 NuStep L4 x 6 minutes Seated hamstring stretch 3 x 15 sec each leg Long kicks- noted click with L knee ext 10 reps each Seated march 10 reps each Heel raise/toe raise 10 reps Clamshells against red tband 10 reps Flexor stretch over ball, 3 x 10 sec forward, and to each side Quick steps side to side, then forward and back, then changing direction upon therapist's command, BUE support. Mild unsteadiness.   05/29/22 NuStep L5 x 6 minutes Functional re-assessment for PN Forward flexion over Physioball x 6 Seated rotation to R, walk hands out and back x 5, repeat to L Lat pulls 10#, 2 x 10 reps Trunk ext against Black Tband, 2 x 10 reps Step ups onto Airex pad, 2 x 5 with each leg, no UE support B side stepping up onto Airex, then off, 5 x each way. Gait assessment- decreased stance on L, antalgic on L, slow, but stable.   05/27/22 NuStep L5 x53mns  STS with  ball push out x10, x10 without ball  Walking on beam Calf stretch 30s  Seated rows 5# 2x10 Lat pull downs 10# 2x10 Heel taps 4" Up and down stair case 2x with support on rails  LAQ 1.5# 2x10 HS curls greenTB 2x10  05/22/22 NuStep L3 x660ms Seated rows 5# 2x10 Lat pull downs 10# 2x10 blackTB ext 2x10 Standing rows and ext with red band 2x10 LAQ with 1.5# 2x10 Standing marches 1.5# 20 reps Hip  abd 1.5# 2x10  05/20/22 NuStep L3 x 6 minutes Performed supine lower body ROM over physioball, rolling B KTC and LTR with assist. Practiced pelvic mobilization, patient with great difficulty moving from lower body, overuses upper body.-Tried in Supine, then in sitting with Up body stabilized-still required mod TC.  05/13/22 NuStep L5 x72mns  STS on airex 2x5  LAQ 2x10 1#  HS curls green 2x10 Hip abd green 2x10  4" heel taps with 1# 2x10 CGA needed Lat pull downs 10# 2x10 Seated rows 5# 2x10 Foam beam in // bars tandem fwd/back, side stepping Standing ext yellow TB 2x10   05/06/22 Nustep L 5 640m Resisted gait 20# 4 x fwd and 3 x each side- minA with increased knee pain esp comeing ack with left leading and decreased stability STS 2 sets 5 without UE with feet on foam mat- decreased eccentric LAQ, hip flex and hip abd 2 sets 10 1 # ( decreased from 2# as sh ehad decreased pain after using 2#) HS curl yellow tband 2 sets 10 Lat pull down 10# 2x10 with minA to pull down Seated rows 10lb 2x10 Foam beam in // bars tandem fwd/back, side stepping and marching  05/01/22 STS 2x10  NuStep L5 x6m69m  Lat pull down 10# 2x10 with minA to pull down Seated rows 5lb 2x10 HS curls with band 2x10 yellow  LAQ no weight 2x10 Side step an tandem walking on airex in // bars Stair negotiation step to pattern  Alt 4 in box taps   04/29/22 NuStep L5 x6mi41m STS 2x10  LAQ no weight 2x10 HS curls with band 2x10 Heel taps 6"  Calf stretch 30s  Calf raises 2x10 Lat pull down 10# 2x10 with minA  to pull down Marching on airex holding out 1# WaTe 20 reps  04/24/22 2# seated LAQ, hip flex and abd 2 sets 10 Red tband trunk ext 2 sets 10 Red tband seated row and shld ext 2 sets 10 Nustep L 4 6 min UE and LE UBE L 2 2 min fwd / 2 back Lat pull 15 # 2 sets 10, help with the pull down ,focus on us [place] UE stretch Seated row 10 # 10 x Standing on airex marching 20 x 2 sets- CGA In // bars on airex with UE hip 3 way 10 x each Foam beam tandem fwd/back and laterally  04/09/22 Sit to stand with alternating LE adjusted for comfort Semirecumbent low trunk rotation HEP demo Seated thoracic rotation x5 reps HEP demo  PATIENT EDUCATION:  Education details: mechanics sit to stand,  Person educated: Patient and Child(ren) Education method: Explanation, [employer] plannerd Handouts Education comprehension: verbalized understanding and returned demonstration  HOME EXERCISE PROGRAM: Access Code: [email or web address]: [email or web address] Date: 04/09/2022 Prepared by: [facility]SESSMENT:  CLINICAL IMPRESSION: Patient reports no big changes. She feels more stiff as the weather gets colder. No changes with LT goals, ST met.Ex to increase ROM and strength with light resistance to not flare pt up.left knee is in genu valgum with gait .  OBJECTIVE IMPAIRMENTS: Abnormal gait, decreased activity tolerance, decreased balance, decreased knowledge of use of DME, decreased mobility, difficulty walking, decreased ROM, decreased strength, increased edema, impaired flexibility, improper body mechanics, postural dysfunction, and pain.   ACTIVITY LIMITATIONS: bending, standing, squatting, sleeping, stairs, and locomotion level  PARTICIPATION LIMITATIONS: cleaning, driving, community activity, and yard work  PERSONAL FACTORS: Age, Past/current experiences, Time since onset of injury/illness/exacerbation,  and 3+ comorbidities: knee OA,  lumbar DDD and scoliosis  are also affecting patient's functional outcome.   REHAB POTENTIAL: Good  CLINICAL DECISION MAKING: Evolving/moderate complexity  EVALUATION COMPLEXITY: High   GOALS: Goals reviewed with patient? Yes  SHORT TERM GOALS: Target date: 06/19/2022  Pt will be independent with her initial HEP to improve LE strength and pain. Baseline: Goal status: met 05/06/22  2.  Pt will be able to demonstrate proper body mechanics with bed mobility to decrease strain on her low back. Baseline:  Goal status: met 05/06/22     LONG TERM GOALS: Target date: 08/28/2022   Pt will be able to complete 5x sit to stand in less than 15 sec with or without UE support, to reflect improvements in LE mobility and strength. Baseline: 23sec, 12/14-16.7 Goal status: ongoing  2.  Pt will report atleast 40% improvement in her knee and low back pain from the start of PT. Baseline: 12/14-Back pain is slightly better, knee pain remains. Goal status: ongoing  3.  Pt will be able to ambulate with LRAD outdoors x258f without assistance, to reflect improvement in her steadiness with ambulation. Baseline: 12/14 uses RW, walks outdoors x 20 minutes. Goal status: met  4.  Pt will be able to maintain SLS for up to 2 sec each LE, 2/3 trials. Baseline: 12/14-Stood on R x 3 sec on one attempt. All others < 2 sec on each leg. Goal status: ongoing   PLAN:  PT FREQUENCY: 2x/week  PT DURATION: 12 weeks  PLANNED INTERVENTIONS: Therapeutic exercises, Therapeutic activity, Neuromuscular re-education, Balance training, Gait training, Patient/Family education, Self Care, Joint mobilization, Aquatic Therapy, Electrical stimulation, Cryotherapy, Manual therapy, and Re-evaluation  PLAN FOR NEXT SESSION progress LE strength, lumbar/thoracic flexibility and balance activity . Pt is seeing minimal changes with therapy so continue to work toward D/C at end of cert period with est HEPs  Patient Details  Name:  Danielle WESTBROOKMRN: [id number] of Birth: [birth year]Referring Provider:  HWenda Low MD  Encounter Date: 06/05/2022  AFranki MontePTA 06/05/2022, 10:08 AM  [place] [address]Phone: [phone]  Fax:  [place] [address]Phone: [phone]  Fax:  [phone] Patient Details  Name: KALAYJA ARMASMRN: [id number] of Birth: 6Apr 24, 1930Referring Provider:  HWenda Low MD  Encounter Date: 06/05/2022   PLaqueta Carina PTA 06/05/2022, 10:08 AM  [place] [address]Phone: [phone]  Fax:  [phone]

## 2022-06-12 ENCOUNTER — Other Ambulatory Visit (HOSPITAL_COMMUNITY): Payer: Self-pay

## 2022-06-19 ENCOUNTER — Encounter: Payer: Self-pay | Admitting: Rheumatology

## 2022-06-19 ENCOUNTER — Ambulatory Visit: Payer: Medicare Other | Attending: Rheumatology | Admitting: Rheumatology

## 2022-06-19 VITALS — BP 149/71 | HR 73 | Ht 60.0 in | Wt 104.6 lb

## 2022-06-19 DIAGNOSIS — S46212S Strain of muscle, fascia and tendon of other parts of biceps, left arm, sequela: Secondary | ICD-10-CM | POA: Diagnosis present

## 2022-06-19 DIAGNOSIS — M4125 Other idiopathic scoliosis, thoracolumbar region: Secondary | ICD-10-CM

## 2022-06-19 DIAGNOSIS — E871 Hypo-osmolality and hyponatremia: Secondary | ICD-10-CM | POA: Diagnosis present

## 2022-06-19 DIAGNOSIS — M5136 Other intervertebral disc degeneration, lumbar region: Secondary | ICD-10-CM | POA: Insufficient documentation

## 2022-06-19 DIAGNOSIS — M810 Age-related osteoporosis without current pathological fracture: Secondary | ICD-10-CM | POA: Diagnosis present

## 2022-06-19 DIAGNOSIS — M5134 Other intervertebral disc degeneration, thoracic region: Secondary | ICD-10-CM

## 2022-06-19 DIAGNOSIS — M19041 Primary osteoarthritis, right hand: Secondary | ICD-10-CM | POA: Diagnosis present

## 2022-06-19 DIAGNOSIS — M170 Bilateral primary osteoarthritis of knee: Secondary | ICD-10-CM | POA: Insufficient documentation

## 2022-06-19 DIAGNOSIS — Z96611 Presence of right artificial shoulder joint: Secondary | ICD-10-CM

## 2022-06-19 DIAGNOSIS — R2689 Other abnormalities of gait and mobility: Secondary | ICD-10-CM | POA: Diagnosis present

## 2022-06-19 DIAGNOSIS — M19042 Primary osteoarthritis, left hand: Secondary | ICD-10-CM | POA: Diagnosis present

## 2022-06-19 DIAGNOSIS — Z8679 Personal history of other diseases of the circulatory system: Secondary | ICD-10-CM | POA: Diagnosis present

## 2022-06-19 MED ORDER — LIDOCAINE HCL 1 % IJ SOLN
1.5000 mL | INTRAMUSCULAR | Status: AC | PRN
  Administered 2022-06-19: 1.5 mL

## 2022-06-19 MED ORDER — TRIAMCINOLONE ACETONIDE 40 MG/ML IJ SUSP
40.0000 mg | INTRAMUSCULAR | Status: AC | PRN
  Administered 2022-06-19: 40 mg via INTRA_ARTICULAR

## 2022-06-23 NOTE — Therapy (Incomplete)
OUTPATIENT PHYSICAL THERAPY LOWER EXTREMITY TREATMENT     Patient Name: Danielle Dean MRN: [id number] DOB:Nov 01, 1928, 89 y.o., female Today's Date: 06/05/2022   PT End of Session - 06/05/22 1007     Visit Number 12    Number of Visits 24    Date for PT Re-Evaluation 07/02/22    Authorization Type MCR and MCD    Authorization Time Period 04/09/22 to 07/02/22    PT Start Time 1015    PT Stop Time 1100    PT Time Calculation (min) 45 min                Past Medical History:  Diagnosis Date   Arthritis    Callus    Cataract fragments in both eyes following surgery    Corns and callosities    Hx of hysterectomy    Hypertension    Osteoarthritis    Past Surgical History:  Procedure Laterality Date   ABDOMINAL HYSTERECTOMY     NASAL SEPTUM SURGERY     ROTATOR CUFF REPAIR Right    TOTAL SHOULDER ARTHROPLASTY     Patient Active Problem List   Diagnosis Date Noted   DDD (degenerative disc disease), thoracic 11/06/2020   Other idiopathic scoliosis, thoracolumbar region 11/06/2020   DDD (degenerative disc disease), lumbar 11/06/2020   Age-related osteoporosis without current pathological fracture 11/06/2020   Porokeratosis 11/26/2018   Corns and callosities 11/26/2018   History of right shoulder replacement 10/07/2016   Chronic pain of both knees 05/20/2016   Osteoarthritis of both hands 05/20/2016   Essential hypertension 05/20/2016   Environmental allergies 05/20/2016   Primary osteoarthritis of both knees 05/19/2016   Callus of foot 11/24/2012   Pain in joint, ankle and foot 09/15/2012    PCP: Wenda Low, MD  REFERRING PROVIDER: Bo Merino, MD  REFERRING DIAG: Primary OA Bil knees, DDD lumbar, poor balance  THERAPY DIAG:  Chronic pain of left knee  Chronic pain of right knee  Muscle weakness (generalized)  Unsteadiness on feet  Rationale for Evaluation and Treatment Rehabilitation  ONSET DATE: chronic issues-several years    SUBJECTIVE:   SUBJECTIVE STATEMENT: Patient reports that the cold weather adds stiffness to her knee pain. No changes  PERTINENT HISTORY: Rt shoulder replacement.  DDD Lumbar/cervical spine. scoliosis PAIN:  Are you having pain? Yes: NPRS scale: 4/10 Pain location: knee/legs Pain description: aching Aggravating factors: prolonged standing, walking; low back pain with standing activity Relieving factors: sitting, wearing knee braces  PRECAUTIONS: Fall  WEIGHT BEARING RESTRICTIONS: No  FALLS:  Has patient fallen in last 6 months? No  LIVING ENVIRONMENT: Lives with: lives with their family and lives alone Lives in: House/apartment Stairs: doesn't need to use steps in her home Has following equipment at home:  rollator , [facility]  OCCUPATION: retired   PLOF: Independent with basic ADLs  PATIENT GOALS: decrease pain in knees/back; improve steadiness    OBJECTIVE:   DIAGNOSTIC FINDINGS:   PATIENT SURVEYS:  FOTO: next visit  COGNITION: Overall cognitive status: Within functional limits for tasks assessed     SENSATION: Not tested denies NT  EDEMA:  None noted at eval, will sometimes have swelling in Lt ankle   MUSCLE LENGTH: Hamstrings: Right 90/90 45 deg ; Left 90/90 45 deg   POSTURE: decreased lumbar lordosis and weight shift left trunk lateral shift Lt   PALPATION: Tenderness Rt glute max  LOWER EXTREMITY ROM:  Passive ROM Right eval Left eval  Hip flexion    Hip  extension    Hip abduction    Hip adduction    Hip internal rotation    Hip external rotation    Knee flexion WNL WNL  Knee extension WNL WNL  Ankle dorsiflexion    Ankle plantarflexion    Ankle inversion    Ankle eversion     (Blank rows = not tested)   LOWER EXTREMITY MMT:  MMT Right eval Left eval  Hip flexion 3+/pain 4  Hip extension 3 3  Hip abduction 3 3  Hip adduction    Hip internal rotation    Hip external rotation    Knee flexion 3+ 3+  Knee extension 3+ 3+/pain   Ankle dorsiflexion 4 4  Ankle plantarflexion    Ankle inversion    Ankle eversion     (Blank rows = not tested) LUMBAR ROM:   AROM eval  Flexion WNL  Extension Unable to reach neutral  Right lateral flexion   Left lateral flexion   Right rotation 75% limited  Left rotation 75% limited   (Blank rows = not tested)  ] LOWER EXTREMITY SPECIAL TESTS:    FUNCTIONAL TESTS:  5 times sit to stand: 23 sec, weight shift Lt, UE assist  GAIT: Distance walked: 20 Assistive device utilized: None Level of assistance: Complete Independence Comments: minimal hip extension Lt, Lt lateral trunk shift, increased weight through Lt LE   TODAY'S TREATMENT                                       06/23/22 NuStep Walking on beam Step ups on airex Side steps on airex LAQ HS curls    06/05/22 Nustep L 4 6 min Lat pulls and Seated rows 10#, 2 x 10 reps Trunk ext against Black Tband, 2 x 10 reps 1.5# LE seated LAQ,hip flex and abd 2 sets 10 Standing on Airex 1.5# HHA marching, hip flex,ext,abd heel raises and mini squats 10 each UBE L 1 2 min fwd/2 min backward Resisted giat 4 way 3 x each 20#    06/03/22 NuStep L4 x 6 minutes Seated hamstring stretch 3 x 15 sec each leg Long kicks- noted click with L knee ext 10 reps each Seated march 10 reps each Heel raise/toe raise 10 reps Clamshells against red tband 10 reps Flexor stretch over ball, 3 x 10 sec forward, and to each side Quick steps side to side, then forward and back, then changing direction upon therapist's command, BUE support. Mild unsteadiness.   05/29/22 NuStep L5 x 6 minutes Functional re-assessment for PN Forward flexion over Physioball x 6 Seated rotation to R, walk hands out and back x 5, repeat to L Lat pulls 10#, 2 x 10 reps Trunk ext against Black Tband, 2 x 10 reps Step ups onto Airex pad, 2 x 5 with each leg, no UE support B side stepping up onto Airex, then off, 5 x each way. Gait assessment- decreased  stance on L, antalgic on L, slow, but stable.   05/27/22 NuStep L5 x37mins  STS with ball push out x10, x10 without ball  Walking on beam Calf stretch 30s  Seated rows 5# 2x10 Lat pull downs 10# 2x10 Heel taps 4" Up and down stair case 2x with support on rails  LAQ 1.5# 2x10 HS curls greenTB 2x10  05/22/22 NuStep L3 x68mins Seated rows 5# 2x10 Lat pull downs 10# 2x10 blackTB ext 2x10 Standing  rows and ext with red band 2x10 LAQ with 1.5# 2x10 Standing marches 1.5# 20 reps Hip abd 1.5# 2x10  05/20/22 NuStep L3 x 6 minutes Performed supine lower body ROM over physioball, rolling B KTC and LTR with assist. Practiced pelvic mobilization, patient with great difficulty moving from lower body, overuses upper body.-Tried in Supine, then in sitting with Up body stabilized-still required mod TC.  05/13/22 NuStep L5 x55mins  STS on airex 2x5  LAQ 2x10 1#  HS curls green 2x10 Hip abd green 2x10  4" heel taps with 1# 2x10 CGA needed Lat pull downs 10# 2x10 Seated rows 5# 2x10 Foam beam in // bars tandem fwd/back, side stepping Standing ext yellow TB 2x10   05/06/22 Nustep L 5 Resisted gait 20# 4 x fwd and 3 x each side- minA with increased knee pain esp comeing ack with left leading and decreased stability STS 2 sets 5 without UE with feet on foam mat- decreased eccentric LAQ, hip flex and hip abd 2 sets 10 1 # ( decreased from 2# as sh ehad decreased pain after using 2#) HS curl yellow tband 2 sets 10 Lat pull down 10# 2x10 with minA to pull down Seated rows 10lb 2x10 Foam beam in // bars tandem fwd/back, side stepping and marching  05/01/22 STS 2x10  NuStep L5 x36mins  Lat pull down 10# 2x10 with minA to pull down Seated rows 5lb 2x10 HS curls with band 2x10 yellow  LAQ no weight 2x10 Side step an tandem walking on airex in // bars Stair negotiation step to pattern  Alt 4 in box taps   04/29/22 NuStep L5 x59mins  STS 2x10  LAQ no weight 2x10 HS curls with band  2x10 Heel taps 6"  Calf stretch 30s  Calf raises 2x10 Lat pull down 10# 2x10 with minA to pull down Marching on airex holding out 1# WaTe 20 reps  04/24/22 2# seated LAQ, hip flex and abd 2 sets 10 Red tband trunk ext 2 sets 10 Red tband seated row and shld ext 2 sets 10 Nustep L 4 6 min UE and LE UBE L 2 2 min fwd / 2 back Lat pull 15 # 2 sets 10, help with the pull down ,focus on [place] and UE stretch Seated row 10 # 10 x Standing on airex marching 20 x 2 sets- CGA In // bars on airex with UE hip 3 way 10 x each Foam beam tandem fwd/back and laterally  04/09/22 Sit to stand with alternating LE adjusted for comfort Semirecumbent low trunk rotation HEP demo Seated thoracic rotation x5 reps HEP demo  PATIENT EDUCATION:  Education details: mechanics sit to stand,  Person educated: Patient and Child(ren) Education method: Explanation, Facilities manager, and Handouts Education comprehension: verbalized understanding and returned demonstration  HOME EXERCISE PROGRAM: Access Code: [place],  [id number] URL: [email or web address] Date: 04/09/2022 Prepared by: [facility] - [facility]  ASSESSMENT:  CLINICAL IMPRESSION: Patient reports no big changes. She feels more stiff as the weather gets colder. No changes with LT goals, ST met.Ex to increase ROM and strength with light resistance to not flare pt up.left knee is in genu valgum with gait .  OBJECTIVE IMPAIRMENTS: Abnormal gait, decreased activity tolerance, decreased balance, decreased knowledge of use of DME, decreased mobility, difficulty walking, decreased ROM, decreased strength, increased edema, impaired flexibility, improper body mechanics, postural dysfunction, and pain.   ACTIVITY LIMITATIONS: bending, standing, squatting, sleeping, stairs, and locomotion level  PARTICIPATION LIMITATIONS: cleaning,  driving, community activity, and yard work  PERSONAL FACTORS: Age, Past/current  experiences, Time since onset of injury/illness/exacerbation, and 3+ comorbidities: knee OA, lumbar DDD and scoliosis  are also affecting patient's functional outcome.   REHAB POTENTIAL: Good  CLINICAL DECISION MAKING: Evolving/moderate complexity  EVALUATION COMPLEXITY: High   GOALS: Goals reviewed with patient? Yes  SHORT TERM GOALS: Target date: 06/19/2022  Pt will be independent with her initial HEP to improve LE strength and pain. Baseline: Goal status: met 05/06/22  2.  Pt will be able to demonstrate proper body mechanics with bed mobility to decrease strain on her low back. Baseline:  Goal status: met 05/06/22     LONG TERM GOALS: Target date: 08/28/2022   Pt will be able to complete 5x sit to stand in less than 15 sec with or without UE support, to reflect improvements in LE mobility and strength. Baseline: 23sec, 12/14-16.7 Goal status: ongoing  2.  Pt will report atleast 40% improvement in her knee and low back pain from the start of PT. Baseline: 12/14-Back pain is slightly better, knee pain remains. Goal status: ongoing  3.  Pt will be able to ambulate with LRAD outdoors x227ft without assistance, to reflect improvement in her steadiness with ambulation. Baseline: 12/14 uses RW, walks outdoors x 20 minutes. Goal status: met  4.  Pt will be able to maintain SLS for up to 2 sec each LE, 2/3 trials. Baseline: 12/14-Stood on R x 3 sec on one attempt. All others < 2 sec on each leg. Goal status: ongoing   PLAN:  PT FREQUENCY: 2x/week  PT DURATION: 12 weeks  PLANNED INTERVENTIONS: Therapeutic exercises, Therapeutic activity, Neuromuscular re-education, Balance training, Gait training, Patient/Family education, Self Care, Joint mobilization, Aquatic Therapy, Electrical stimulation, Cryotherapy, Manual therapy, and Re-evaluation  PLAN FOR NEXT SESSION progress LE strength, lumbar/thoracic flexibility and balance activity . Pt is seeing minimal changes with therapy  so continue to work toward D/C at end of cert period with est HEPs  Patient Details  Name: Danielle Dean MRN: [id number] Date of Birth: 06/10/1929 Referring Provider:  Georgann Housekeeper, MD  Encounter Date: 06/05/2022  Amie Critchley PTA 06/05/2022, 10:08 AM  [facility] [address] [place], [name], [address] Phone: [phone]   Fax:  [phone] [address] [place], [name], [address] Phone: [phone]   Fax:  [phone]  Patient Details  Name: Danielle Dean MRN: [id number] Date of Birth: January 12, 1929 Referring Provider:  Georgann Housekeeper, MD  Encounter Date: 06/05/2022   Suanne Marker, PTA 06/05/2022, 10:08 AM  [facility] [address] [place], [name], [address] Phone: [phone]   Fax:  [phone]

## 2022-06-24 ENCOUNTER — Ambulatory Visit: Payer: Medicare Other

## 2022-06-26 ENCOUNTER — Other Ambulatory Visit: Payer: Self-pay

## 2022-06-26 ENCOUNTER — Ambulatory Visit: Payer: Medicare Other | Attending: Internal Medicine | Admitting: Physical Therapy

## 2022-06-26 ENCOUNTER — Encounter: Payer: Self-pay | Admitting: Physical Therapy

## 2022-06-26 DIAGNOSIS — M25562 Pain in left knee: Secondary | ICD-10-CM | POA: Insufficient documentation

## 2022-06-26 DIAGNOSIS — M545 Low back pain, unspecified: Secondary | ICD-10-CM | POA: Diagnosis present

## 2022-06-26 DIAGNOSIS — M25561 Pain in right knee: Secondary | ICD-10-CM | POA: Diagnosis present

## 2022-06-26 DIAGNOSIS — M6281 Muscle weakness (generalized): Secondary | ICD-10-CM | POA: Diagnosis present

## 2022-06-26 DIAGNOSIS — G8929 Other chronic pain: Secondary | ICD-10-CM | POA: Diagnosis present

## 2022-06-26 DIAGNOSIS — R2681 Unsteadiness on feet: Secondary | ICD-10-CM | POA: Diagnosis present

## 2022-06-26 DIAGNOSIS — R262 Difficulty in walking, not elsewhere classified: Secondary | ICD-10-CM | POA: Diagnosis present

## 2022-06-26 DIAGNOSIS — R293 Abnormal posture: Secondary | ICD-10-CM | POA: Insufficient documentation

## 2022-06-26 DIAGNOSIS — R2689 Other abnormalities of gait and mobility: Secondary | ICD-10-CM | POA: Insufficient documentation

## 2022-06-26 NOTE — Therapy (Signed)
OUTPATIENT PHYSICAL THERAPY LOWER EXTREMITY TREATMENT   Patient Name: Danielle Dean MRN: [id number] DOB:Jul 03, 1928, 89 y.o., female Today's Date: 06/26/2022   PT End of Session - 06/26/22 1011     Visit Number 13    Date for PT Re-Evaluation 08/03/22    Authorization Type MCR and MCD    PT Start Time 1010    PT Stop Time 1100    PT Time Calculation (min) 50 min    Activity Tolerance Patient tolerated treatment well    Behavior During Therapy WFL for tasks assessed/performed                Past Medical History:  Diagnosis Date   Arthritis    Callus    Cataract fragments in both eyes following surgery    Corns and callosities    Hx of hysterectomy    Hypertension    Osteoarthritis    Past Surgical History:  Procedure Laterality Date   ABDOMINAL HYSTERECTOMY     NASAL SEPTUM SURGERY     ROTATOR CUFF REPAIR Right    TOTAL SHOULDER ARTHROPLASTY     Patient Active Problem List   Diagnosis Date Noted   DDD (degenerative disc disease), thoracic 11/06/2020   Other idiopathic scoliosis, thoracolumbar region 11/06/2020   DDD (degenerative disc disease), lumbar 11/06/2020   Age-related osteoporosis without current pathological fracture 11/06/2020   Porokeratosis 11/26/2018   Corns and callosities 11/26/2018   History of right shoulder replacement 10/07/2016   Chronic pain of both knees 05/20/2016   Osteoarthritis of both hands 05/20/2016   Essential hypertension 05/20/2016   Environmental allergies 05/20/2016   Primary osteoarthritis of both knees 05/19/2016   Callus of foot 11/24/2012   Pain in joint, ankle and foot 09/15/2012    PCP: Wenda Low, MD  REFERRING PROVIDER: Bo Merino, MD  REFERRING DIAG: Primary OA Bil knees, DDD lumbar, poor balance  THERAPY DIAG:  Chronic pain of left knee - Plan: PT plan of care cert/re-cert  Chronic pain of right knee - Plan: PT plan of care cert/re-cert  Muscle weakness (generalized) - Plan: PT plan of care  cert/re-cert  Unsteadiness on feet - Plan: PT plan of care cert/re-cert  Difficulty in walking, not elsewhere classified - Plan: PT plan of care cert/re-cert  Rationale for Evaluation and Treatment Rehabilitation  ONSET DATE: chronic issues-several years   SUBJECTIVE:   SUBJECTIVE STATEMENT: Patient reports that she was staying with her daughter in [place] and was not coming in to see PT over the past 3 weeks.  She saw Dr. Estanislado Pandy last week and she wants [place] to focus on balance PERTINENT HISTORY: Rt shoulder replacement.  DDD Lumbar/cervical spine. scoliosis PAIN:  Are you having pain? Yes: NPRS scale: 4/10 Pain location: knee/legs Pain description: aching Aggravating factors: prolonged standing, walking; low back pain with standing activity Relieving factors: sitting, wearing knee braces  PRECAUTIONS: Fall  WEIGHT BEARING RESTRICTIONS: No  FALLS:  Has patient fallen in last 6 months? No  LIVING ENVIRONMENT: Lives with: lives with their family and lives alone Lives in: House/apartment Stairs: doesn't need to use steps in her home Has following equipment at home:  rollator , [facility]  OCCUPATION: retired   PLOF: Independent with basic ADLs  PATIENT GOALS: decrease pain in knees/back; improve steadiness    OBJECTIVE:   DIAGNOSTIC FINDINGS:   PATIENT SURVEYS:  FOTO: next visit  COGNITION: Overall cognitive status: Within functional limits for tasks assessed     SENSATION: Not tested denies  NT  EDEMA:  None noted at eval, will sometimes have swelling in Lt ankle   MUSCLE LENGTH: Hamstrings: Right 90/90 45 deg ; Left 90/90 45 deg   POSTURE: decreased lumbar lordosis and weight shift left trunk lateral shift Lt   PALPATION: Tenderness Rt glute max  LOWER EXTREMITY ROM:  Passive ROM Right eval Left eval  Hip flexion    Hip extension    Hip abduction    Hip adduction    Hip internal rotation    Hip external rotation    Knee flexion WNL WNL  Knee  extension WNL WNL  Ankle dorsiflexion    Ankle plantarflexion    Ankle inversion    Ankle eversion     (Blank rows = not tested)   LOWER EXTREMITY MMT:  MMT Right eval Left eval Right/left 06/26/22  Hip flexion 3+/pain 4 4-/4  Hip extension 3 3   Hip abduction 3 3 4-/4-  Hip adduction     Hip internal rotation     Hip external rotation     Knee flexion 3+ 3+ 4-/4-  Knee extension 3+ 3+/pain 4-/4- pain  Ankle dorsiflexion 4 4   Ankle plantarflexion     Ankle inversion     Ankle eversion      (Blank rows = not tested) LUMBAR ROM:   AROM eval  Flexion WNL  Extension Unable to reach neutral  Right lateral flexion   Left lateral flexion   Right rotation 75% limited  Left rotation 75% limited   (Blank rows = not tested)  ] LOWER EXTREMITY SPECIAL TESTS:    FUNCTIONAL TESTS:  5 times sit to stand: 23 sec, weight shift Lt, UE assist  BERG balance test 06/26/22  = 38/56 GAIT: Distance walked: 20 Assistive device utilized: None Level of assistance: Complete Independence Comments: minimal hip extension Lt, Lt lateral trunk shift, increased weight through Lt LE   TODAY'S TREATMENT                                       06/26/22 Renewal and assess Berg 38/56 MMT Nustep level 4 x 6 minutes UBE Level 1 x 4 minutes Side stepping over object Volleyball On airex head turns and reaching Cone toe and hand touches, needed CGA Direction changes  06/05/22 Nustep L 4 6 min Lat pulls and Seated rows 10#, 2 x 10 reps Trunk ext against Black Tband, 2 x 10 reps 1.5# LE seated LAQ,hip flex and abd 2 sets 10 Standing on Airex 1.5# HHA marching, hip flex,ext,abd heel raises and mini squats 10 each UBE L 1 2 min fwd/2 min backward Resisted giat 4 way 3 x each 20#    06/03/22 NuStep L4 x 6 minutes Seated hamstring stretch 3 x 15 sec each leg Long kicks- noted click with L knee ext 10 reps each Seated march 10 reps each Heel raise/toe raise 10 reps Clamshells against red  tband 10 reps Flexor stretch over ball, 3 x 10 sec forward, and to each side Quick steps side to side, then forward and back, then changing direction upon therapist's command, BUE support. Mild unsteadiness.   05/29/22 NuStep L5 x 6 minutes Functional re-assessment for PN Forward flexion over Physioball x 6 Seated rotation to R, walk hands out and back x 5, repeat to L Lat pulls 10#, 2 x 10 reps Trunk ext against Black Tband, 2 x 10  reps Step ups onto Airex pad, 2 x 5 with each leg, no UE support B side stepping up onto Airex, then off, 5 x each way. Gait assessment- decreased stance on L, antalgic on L, slow, but stable.   05/27/22 NuStep L5 x50mins  STS with ball push out x10, x10 without ball  Walking on beam Calf stretch 30s  Seated rows 5# 2x10 Lat pull downs 10# 2x10 Heel taps 4" Up and down stair case 2x with support on rails  LAQ 1.5# 2x10 HS curls greenTB 2x10  05/22/22 NuStep L3 x71mins Seated rows 5# 2x10 Lat pull downs 10# 2x10 blackTB ext 2x10 Standing rows and ext with red band 2x10 LAQ with 1.5# 2x10 Standing marches 1.5# 20 reps Hip abd 1.5# 2x10  05/20/22 NuStep L3 x 6 minutes Performed supine lower body ROM over physioball, rolling B KTC and LTR with assist. Practiced pelvic mobilization, patient with great difficulty moving from lower body, overuses upper body.-Tried in Supine, then in sitting with Up body stabilized-still required mod TC.  05/13/22 NuStep L5 x53mins  STS on airex 2x5  LAQ 2x10 1#  HS curls green 2x10 Hip abd green 2x10  4" heel taps with 1# 2x10 CGA needed Lat pull downs 10# 2x10 Seated rows 5# 2x10 Foam beam in // bars tandem fwd/back, side stepping Standing ext yellow TB 2x10   PATIENT EDUCATION:  Education details: mechanics sit to stand,  Person educated: Patient and Child(ren) Education method: Explanation, Demonstration, and Handouts Education comprehension: verbalized understanding and returned demonstration  HOME  EXERCISE PROGRAM: Access Code: [email or web address],  [id number] URL: [email or web address] Date: 04/09/2022 Prepared by: [facility] - [facility] - [facility]  ASSESSMENT:  CLINICAL IMPRESSION: Patient has not been in due to being out of town while her family was away, she also has difficulty with appointments due to needing a driver (caregiver) to bring her, reports that she cannot do Fridays and needs to be between 10:15 and 1PM due to the caregiver schedule.  She saw Dr. Corliss Skains last week and she felt like we needed to work on her balance, she scored a 38/56 putting her at a high risk for falls, her MMT was better today compared to 10 weeks ago.  OBJECTIVE IMPAIRMENTS: Abnormal gait, decreased activity tolerance, decreased balance, decreased knowledge of use of DME, decreased mobility, difficulty walking, decreased ROM, decreased strength, increased edema, impaired flexibility, improper body mechanics, postural dysfunction, and pain.   ACTIVITY LIMITATIONS: bending, standing, squatting, sleeping, stairs, and locomotion level  PARTICIPATION LIMITATIONS: cleaning, driving, community activity, and yard work  PERSONAL FACTORS: Age, Past/current experiences, Time since onset of injury/illness/exacerbation, and 3+ comorbidities: knee OA, lumbar DDD and scoliosis  are also affecting patient's functional outcome.   REHAB POTENTIAL: Good  CLINICAL DECISION MAKING: Evolving/moderate complexity  EVALUATION COMPLEXITY: High   GOALS: Goals reviewed with patient? Yes  SHORT TERM GOALS: Target date: 05/10/22 Pt will be independent with her initial HEP to improve LE strength and pain. Baseline: Goal status: met 05/06/22  2.  Pt will be able to demonstrate proper body mechanics with bed mobility to decrease strain on her low back. Baseline:  Goal status: met 05/06/22     LONG TERM GOALS: Target date: 09/18/22  Pt will be able to complete 5x sit to stand in less than  15 sec with or without UE support, to reflect improvements in LE mobility and strength. Baseline: 23sec, 12/14-16.7 Goal status: ongoing  2.  Pt will report atleast 40% improvement in  her knee and low back pain from the start of PT. Baseline: 12/14-Back pain is slightly better, knee pain remains. Goal status: ongoing  3.  Pt will be able to ambulate with LRAD outdoors x287ft without assistance, to reflect improvement in her steadiness with ambulation. Baseline: 12/14 uses RW, walks outdoors x 20 minutes. Goal status: met  4.  Pt will be able to maintain SLS for up to 2 sec each LE, 2/3 trials. Baseline: 12/14-Stood on R x 3 sec on one attempt. All others < 2 sec on each leg. Goal status: ongoing   PLAN:  PT FREQUENCY: 2x/week  PT DURATION: 12 weeks  PLANNED INTERVENTIONS: Therapeutic exercises, Therapeutic activity, Neuromuscular re-education, Balance training, Gait training, Patient/Family education, Self Care, Joint mobilization, Aquatic Therapy, Electrical stimulation, Cryotherapy, Manual therapy, and Re-evaluation  PLAN FOR NEXT SESSION progress LE strength, lumbar/thoracic flexibility and balance activity . Needs a lot of work on her balance  Patient Details  Name: Danielle Dean MRN: [id number] Date of Birth: 21-Nov-1928 Referring Provider:  Georgann Housekeeper, MD  Encounter Date: 06/26/2022  Stacie Glaze, PT 06/26/2022, 11:03 AM

## 2022-06-27 ENCOUNTER — Other Ambulatory Visit (HOSPITAL_COMMUNITY): Payer: Self-pay

## 2022-07-10 ENCOUNTER — Encounter: Payer: Self-pay | Admitting: Physical Therapy

## 2022-07-10 ENCOUNTER — Ambulatory Visit: Payer: Medicare Other | Admitting: Physical Therapy

## 2022-07-10 DIAGNOSIS — R262 Difficulty in walking, not elsewhere classified: Secondary | ICD-10-CM

## 2022-07-10 DIAGNOSIS — R2681 Unsteadiness on feet: Secondary | ICD-10-CM

## 2022-07-10 DIAGNOSIS — M25562 Pain in left knee: Secondary | ICD-10-CM | POA: Diagnosis not present

## 2022-07-10 DIAGNOSIS — M6281 Muscle weakness (generalized): Secondary | ICD-10-CM

## 2022-07-10 DIAGNOSIS — M545 Low back pain, unspecified: Secondary | ICD-10-CM

## 2022-07-10 DIAGNOSIS — R2689 Other abnormalities of gait and mobility: Secondary | ICD-10-CM

## 2022-07-10 NOTE — Therapy (Signed)
OUTPATIENT PHYSICAL THERAPY LOWER EXTREMITY TREATMENT   Patient Name: Danielle Dean MRN: [id number] DOB:[birth year], 89 y.o., female Today's Date: 07/10/2022   PT End of Session - 07/10/22 1019     Visit Number 14    Date for PT Re-Evaluation 08/03/22    PT Start Time 1013    PT Stop Time 1055    PT Time Calculation (min) 42 min    Activity Tolerance Patient tolerated treatment well    Behavior During Therapy WFL for tasks assessed/performed                Past Medical History:  Diagnosis Date   Arthritis    Callus    Cataract fragments in both eyes following surgery    Corns and callosities    Hx of hysterectomy    Hypertension    Osteoarthritis    Past Surgical History:  Procedure Laterality Date   ABDOMINAL HYSTERECTOMY     NASAL SEPTUM SURGERY     ROTATOR CUFF REPAIR Right    TOTAL SHOULDER ARTHROPLASTY     Patient Active Problem List   Diagnosis Date Noted   DDD (degenerative disc disease), thoracic 11/06/2020   Other idiopathic scoliosis, thoracolumbar region 11/06/2020   DDD (degenerative disc disease), lumbar 11/06/2020   Age-related osteoporosis without current pathological fracture 11/06/2020   Porokeratosis 11/26/2018   Corns and callosities 11/26/2018   History of right shoulder replacement 10/07/2016   Chronic pain of both knees 05/20/2016   Osteoarthritis of both hands 05/20/2016   Essential hypertension 05/20/2016   Environmental allergies 05/20/2016   Primary osteoarthritis of both knees 05/19/2016   Callus of foot 11/24/2012   Pain in joint, ankle and foot 09/15/2012    PCP: Georgann Housekeeper, MD  REFERRING PROVIDER: Pollyann Savoy, MD  REFERRING DIAG: Primary OA Bil knees, DDD lumbar, poor balance  THERAPY DIAG:  Unsteadiness on feet  Difficulty in walking, not elsewhere classified  Other abnormalities of gait and mobility  Acute bilateral low back pain without sciatica  Muscle weakness (generalized)  Rationale for  Evaluation and Treatment Rehabilitation  ONSET DATE: chronic issues-several years   SUBJECTIVE:   SUBJECTIVE STATEMENT: Patient reports that her R ankle started hurting yesterday. PERTINENT HISTORY: Rt shoulder replacement.  DDD Lumbar/cervical spine. scoliosis PAIN:  Are you having pain? Yes: NPRS scale: 4/10 Pain location: knee/legs Pain description: aching Aggravating factors: prolonged standing, walking; low back pain with standing activity Relieving factors: sitting, wearing knee braces  PRECAUTIONS: Fall  WEIGHT BEARING RESTRICTIONS: No  FALLS:  Has patient fallen in last 6 months? No  LIVING ENVIRONMENT: Lives with: lives with their family and lives alone Lives in: House/apartment Stairs: doesn't need to use steps in her home Has following equipment at home:  rollator , [facility]  OCCUPATION: retired   PLOF: Independent with basic ADLs  PATIENT GOALS: decrease pain in knees/back; improve steadiness    OBJECTIVE:   DIAGNOSTIC FINDINGS:   PATIENT SURVEYS:  FOTO: next visit  COGNITION: Overall cognitive status: Within functional limits for tasks assessed     SENSATION: Not tested denies NT  EDEMA:  None noted at eval, will sometimes have swelling in Lt ankle   MUSCLE LENGTH: Hamstrings: Right 90/90 45 deg ; Left 90/90 45 deg   POSTURE: decreased lumbar lordosis and weight shift left trunk lateral shift Lt   PALPATION: Tenderness Rt glute max  LOWER EXTREMITY ROM:  Passive ROM Right eval Left eval  Hip flexion    Hip extension  Hip abduction    Hip adduction    Hip internal rotation    Hip external rotation    Knee flexion WNL WNL  Knee extension WNL WNL  Ankle dorsiflexion    Ankle plantarflexion    Ankle inversion    Ankle eversion     (Blank rows = not tested)   LOWER EXTREMITY MMT:  MMT Right eval Left eval Right/left 06/26/22  Hip flexion 3+/pain 4 4-/4  Hip extension 3 3   Hip abduction 3 3 4-/4-  Hip adduction     Hip  internal rotation     Hip external rotation     Knee flexion 3+ 3+ 4-/4-  Knee extension 3+ 3+/pain 4-/4- pain  Ankle dorsiflexion 4 4   Ankle plantarflexion     Ankle inversion     Ankle eversion      (Blank rows = not tested) LUMBAR ROM:   AROM eval  Flexion WNL  Extension Unable to reach neutral  Right lateral flexion   Left lateral flexion   Right rotation 75% limited  Left rotation 75% limited   (Blank rows = not tested)  ] LOWER EXTREMITY SPECIAL TESTS:    FUNCTIONAL TESTS:  5 times sit to stand: 23 sec, weight shift Lt, UE assist  BERG balance test 06/26/22  = 38/56 GAIT: Distance walked: 20 Assistive device utilized: None Level of assistance: Complete Independence Comments: minimal hip extension Lt, Lt lateral trunk shift, increased weight through Lt LE   TODAY'S TREATMENT                                       07/10/22 NuStep L4 x 6 minutes Standing, step forward with RLE, pick up a cone, then step back and ER RLE, reach back and place the cone on the mat. 5 x to each side, no instability noted. B side stepping on air ex plank 6 x each direction. Standing on air ex-static, lat weight shifts, for/back, all fairly easy for patient. Stand on air ex with narrow BOS, more challenging during weight shifts. Horizontal head turns, challenging, Vertical head turns easy. 4 square stepping over lines on floor, moving as quickly as possible in each direction. Able to increase speed without unsteadiness. Legs fatigued. Ambulation- forward with horizontal head turns, 2 x 25', then walking backwards, 2 x 25', mildly unsteady, but no LOB, CGA. Forward bend to pick up cone at 10", turn to R and reach up to place it on a table. 5 x each direction, no unsteadiness noted Alternately tapping 4" step with each foot, no UE support, increased speed, x 30 sec, no LOB.  06/26/22 Renewal and assess Merrilee Jansky 38/56 MMT Nustep level 4 x 6 minutes UBE Level 1 x 4 minutes Side stepping over  object Volleyball On airex head turns and reaching Cone toe and hand touches, needed CGA Direction changes  06/05/22 Nustep L 4 6 min Lat pulls and Seated rows 10#, 2 x 10 reps Trunk ext against Black Tband, 2 x 10 reps 1.5# LE seated LAQ,hip flex and abd 2 sets 10 Standing on Airex 1.5# HHA marching, hip flex,ext,abd heel raises and mini squats 10 each UBE L 1 2 min fwd/2 min backward Resisted giat 4 way 3 x each 20#    06/03/22 NuStep L4 x 6 minutes Seated hamstring stretch 3 x 15 sec each leg Long kicks- noted click with L knee  ext 10 reps each Seated march 10 reps each Heel raise/toe raise 10 reps Clamshells against red tband 10 reps Flexor stretch over ball, 3 x 10 sec forward, and to each side Quick steps side to side, then forward and back, then changing direction upon therapist's command, BUE support. Mild unsteadiness.   05/29/22 NuStep L5 x 6 minutes Functional re-assessment for PN Forward flexion over Physioball x 6 Seated rotation to R, walk hands out and back x 5, repeat to L Lat pulls 10#, 2 x 10 reps Trunk ext against Black Tband, 2 x 10 reps Step ups onto Airex pad, 2 x 5 with each leg, no UE support B side stepping up onto Airex, then off, 5 x each way. Gait assessment- decreased stance on L, antalgic on L, slow, but stable.   05/27/22 NuStep L5 x58mins  STS with ball push out x10, x10 without ball  Walking on beam Calf stretch 30s  Seated rows 5# 2x10 Lat pull downs 10# 2x10 Heel taps 4" Up and down stair case 2x with support on rails  LAQ 1.5# 2x10 HS curls greenTB 2x10  05/22/22 NuStep L3 x68mins Seated rows 5# 2x10 Lat pull downs 10# 2x10 blackTB ext 2x10 Standing rows and ext with red band 2x10 LAQ with 1.5# 2x10 Standing marches 1.5# 20 reps Hip abd 1.5# 2x10  05/20/22 NuStep L3 x 6 minutes Performed supine lower body ROM over physioball, rolling B KTC and LTR with assist. Practiced pelvic mobilization, patient with great difficulty  moving from lower body, overuses upper body.-Tried in Supine, then in sitting with Up body stabilized-still required mod TC.  05/13/22 NuStep L5 x55mins  STS on airex 2x5  LAQ 2x10 1#  HS curls green 2x10 Hip abd green 2x10  4" heel taps with 1# 2x10 CGA needed Lat pull downs 10# 2x10 Seated rows 5# 2x10 Foam beam in // bars tandem fwd/back, side stepping Standing ext yellow TB 2x10   PATIENT EDUCATION:  Education details: mechanics sit to stand,  Person educated: Patient and Child(ren) Education method: Explanation, Demonstration, and Handouts Education comprehension: verbalized understanding and returned demonstration  HOME EXERCISE PROGRAM: Access Code: [email or web address],  [id number] URL: [email or web address] Date: 04/09/2022 Prepared by: [facility]  ASSESSMENT:  CLINICAL IMPRESSION: Patient reports new pain in R ankle, started yesterday. Treatment emphasized balance activities to decrease fall risk. Her ankle pain improved with activity. Biggest challenges seem to be with head turns and quick steps.  OBJECTIVE IMPAIRMENTS: Abnormal gait, decreased activity tolerance, decreased balance, decreased knowledge of use of DME, decreased mobility, difficulty walking, decreased ROM, decreased strength, increased edema, impaired flexibility, improper body mechanics, postural dysfunction, and pain.   ACTIVITY LIMITATIONS: bending, standing, squatting, sleeping, stairs, and locomotion level  PARTICIPATION LIMITATIONS: cleaning, driving, community activity, and yard work  PERSONAL FACTORS: Age, Past/current experiences, Time since onset of injury/illness/exacerbation, and 3+ comorbidities: knee OA, lumbar DDD and scoliosis  are also affecting patient's functional outcome.   REHAB POTENTIAL: Good  CLINICAL DECISION MAKING: Evolving/moderate complexity  EVALUATION COMPLEXITY: High   GOALS: Goals reviewed with patient? Yes  SHORT  TERM GOALS: Target date: 05/10/22 Pt will be independent with her initial HEP to improve LE strength and pain. Baseline: Goal status: met 05/06/22  2.  Pt will be able to demonstrate proper body mechanics with bed mobility to decrease strain on her low back. Baseline:  Goal status: met 05/06/22     LONG TERM GOALS: Target date: 09/18/22  Pt  will be able to complete 5x sit to stand in less than 15 sec with or without UE support, to reflect improvements in LE mobility and strength. Baseline: 23sec, 12/14-16.7 Goal status: ongoing  2.  Pt will report atleast 40% improvement in her knee and low back pain from the start of PT. Baseline: 12/14-Back pain is slightly better, knee pain remains. Goal status: ongoing  3.  Pt will be able to ambulate with LRAD outdoors x221ft without assistance, to reflect improvement in her steadiness with ambulation. Baseline: 12/14 uses RW, walks outdoors x 20 minutes. Goal status: met  4.  Pt will be able to maintain SLS for up to 2 sec each LE, 2/3 trials. Baseline: 12/14-Stood on R x 3 sec on one attempt. All others < 2 sec on each leg. Goal status: ongoing   PLAN:  PT FREQUENCY: 2x/week  PT DURATION: 12 weeks  PLANNED INTERVENTIONS: Therapeutic exercises, Therapeutic activity, Neuromuscular re-education, Balance training, Gait training, Patient/Family education, Self Care, Joint mobilization, Aquatic Therapy, Electrical stimulation, Cryotherapy, Manual therapy, and Re-evaluation  PLAN FOR NEXT SESSION progress LE strength, lumbar/thoracic flexibility and balance activity . Needs a lot of work on her balance  Patient Details  Name: Danielle Dean MRN: [id number] Date of Birth: 05-28-1929 Referring Provider:  Wenda Low, MD  Encounter Date: 07/10/2022  Ethel Rana DPT 07/10/22 10:54 AM

## 2022-07-15 ENCOUNTER — Encounter: Payer: Self-pay | Admitting: Physical Therapy

## 2022-07-15 ENCOUNTER — Ambulatory Visit: Payer: Medicare Other | Admitting: Physical Therapy

## 2022-07-15 DIAGNOSIS — G8929 Other chronic pain: Secondary | ICD-10-CM

## 2022-07-15 DIAGNOSIS — R262 Difficulty in walking, not elsewhere classified: Secondary | ICD-10-CM

## 2022-07-15 DIAGNOSIS — R293 Abnormal posture: Secondary | ICD-10-CM

## 2022-07-15 DIAGNOSIS — M25562 Pain in left knee: Secondary | ICD-10-CM | POA: Diagnosis not present

## 2022-07-15 DIAGNOSIS — R2689 Other abnormalities of gait and mobility: Secondary | ICD-10-CM

## 2022-07-15 DIAGNOSIS — M545 Low back pain, unspecified: Secondary | ICD-10-CM

## 2022-07-15 DIAGNOSIS — R2681 Unsteadiness on feet: Secondary | ICD-10-CM

## 2022-07-15 DIAGNOSIS — M6281 Muscle weakness (generalized): Secondary | ICD-10-CM

## 2022-07-15 NOTE — Therapy (Signed)
OUTPATIENT PHYSICAL THERAPY LOWER EXTREMITY TREATMENT   Patient Name: Danielle Dean MRN: [id number] DOB:Nov 25, 1928, 89 y.o., female Today's Date: 07/15/2022        Past Medical History:  Diagnosis Date   Arthritis    Callus    Cataract fragments in both eyes following surgery    Corns and callosities    Hx of hysterectomy    Hypertension    Osteoarthritis    Past Surgical History:  Procedure Laterality Date   ABDOMINAL HYSTERECTOMY     NASAL SEPTUM SURGERY     ROTATOR CUFF REPAIR Right    TOTAL SHOULDER ARTHROPLASTY     Patient Active Problem List   Diagnosis Date Noted   DDD (degenerative disc disease), thoracic 11/06/2020   Other idiopathic scoliosis, thoracolumbar region 11/06/2020   DDD (degenerative disc disease), lumbar 11/06/2020   Age-related osteoporosis without current pathological fracture 11/06/2020   Porokeratosis 11/26/2018   Corns and callosities 11/26/2018   History of right shoulder replacement 10/07/2016   Chronic pain of both knees 05/20/2016   Osteoarthritis of both hands 05/20/2016   Essential hypertension 05/20/2016   Environmental allergies 05/20/2016   Primary osteoarthritis of both knees 05/19/2016   Callus of foot 11/24/2012   Pain in joint, ankle and foot 09/15/2012    PCP: Wenda Low, MD  REFERRING PROVIDER: Bo Merino, MD  REFERRING DIAG: Primary OA Bil knees, DDD lumbar, poor balance  THERAPY DIAG:  No diagnosis found.  Rationale for Evaluation and Treatment Rehabilitation  ONSET DATE: chronic issues-several years   SUBJECTIVE:   SUBJECTIVE STATEMENT: Still having pain in the knees and R ankle. I am taking medicine but it is not helping.    PERTINENT HISTORY: Rt shoulder replacement.  DDD Lumbar/cervical spine. scoliosis PAIN:  Are you having pain? Yes: NPRS scale: 4/10 Pain location: knee/legs Pain description: aching Aggravating factors: prolonged standing, walking; low back pain with standing  activity Relieving factors: sitting, wearing knee braces  PRECAUTIONS: Fall  WEIGHT BEARING RESTRICTIONS: No  FALLS:  Has patient fallen in last 6 months? No  LIVING ENVIRONMENT: Lives with: lives with their family and lives alone Lives in: House/apartment Stairs: doesn't need to use steps in her home Has following equipment at home:  rollator , [facility]  OCCUPATION: retired   PLOF: Independent with basic ADLs  PATIENT GOALS: decrease pain in knees/back; improve steadiness    OBJECTIVE:   DIAGNOSTIC FINDINGS:   PATIENT SURVEYS:  FOTO: next visit  COGNITION: Overall cognitive status: Within functional limits for tasks assessed     SENSATION: Not tested denies NT  EDEMA:  None noted at eval, will sometimes have swelling in Lt ankle   MUSCLE LENGTH: Hamstrings: Right 90/90 45 deg ; Left 90/90 45 deg   POSTURE: decreased lumbar lordosis and weight shift left trunk lateral shift Lt   PALPATION: Tenderness Rt glute max  LOWER EXTREMITY ROM:  Passive ROM Right eval Left eval  Hip flexion    Hip extension    Hip abduction    Hip adduction    Hip internal rotation    Hip external rotation    Knee flexion WNL WNL  Knee extension WNL WNL  Ankle dorsiflexion    Ankle plantarflexion    Ankle inversion    Ankle eversion     (Blank rows = not tested)   LOWER EXTREMITY MMT:  MMT Right eval Left eval Right/left 06/26/22  Hip flexion 3+/pain 4 4-/4  Hip extension 3 3   Hip abduction 3  3 4-/4-  Hip adduction     Hip internal rotation     Hip external rotation     Knee flexion 3+ 3+ 4-/4-  Knee extension 3+ 3+/pain 4-/4- pain  Ankle dorsiflexion 4 4   Ankle plantarflexion     Ankle inversion     Ankle eversion      (Blank rows = not tested) LUMBAR ROM:   AROM eval  Flexion WNL  Extension Unable to reach neutral  Right lateral flexion   Left lateral flexion   Right rotation 75% limited  Left rotation 75% limited   (Blank rows = not  tested)  ] LOWER EXTREMITY SPECIAL TESTS:    FUNCTIONAL TESTS:  5 times sit to stand: 23 sec, weight shift Lt, UE assist  BERG balance test 06/26/22  = 38/56 GAIT: Distance walked: 20 Assistive device utilized: None Level of assistance: Complete Independence Comments: minimal hip extension Lt, Lt lateral trunk shift, increased weight through Lt LE   TODAY'S TREATMENT                                       07/17/22 NuStep L5 x80mins R ankle TB x10 Lat pulls downs 10# 2x10 Seated row 5# 2x10 Cone taps 20 reps alternating x2 Side steps over obstacles  Marching on airex   07/15/22 Nustep level 5 x 7 minutes Sit fit ankle motions Yellow tband ankle exercises 4 ways Lat pulls 10# 2x10 5# seated row 2x10 Supine feet on ball K2C, trunk rotation, small bridges and isometric abs PT gentle cervical distraction patient in supine Side step over sticks Cone toe touches Side step on and off airex On airex reaching, head turns and eyes closed  07/10/22 NuStep L4 x 6 minutes Standing, step forward with RLE, pick up a cone, then step back and ER RLE, reach back and place the cone on the mat. 5 x to each side, no instability noted. B side stepping on air ex plank 6 x each direction. Standing on air ex-static, lat weight shifts, for/back, all fairly easy for patient. Stand on air ex with narrow BOS, more challenging during weight shifts. Horizontal head turns, challenging, Vertical head turns easy. 4 square stepping over lines on floor, moving as quickly as possible in each direction. Able to increase speed without unsteadiness. Legs fatigued. Ambulation- forward with horizontal head turns, 2 x 25', then walking backwards, 2 x 25', mildly unsteady, but no LOB, CGA. Forward bend to pick up cone at 10", turn to R and reach up to place it on a table. 5 x each direction, no unsteadiness noted Alternately tapping 4" step with each foot, no UE support, increased speed, x 30 sec, no  LOB.  06/26/22 Renewal and assess Merrilee Jansky 38/56 MMT Nustep level 4 x 6 minutes UBE Level 1 x 4 minutes Side stepping over object Volleyball On airex head turns and reaching Cone toe and hand touches, needed CGA Direction changes  06/05/22 Nustep L 4 6 min Lat pulls and Seated rows 10#, 2 x 10 reps Trunk ext against Black Tband, 2 x 10 reps 1.5# LE seated LAQ,hip flex and abd 2 sets 10 Standing on Airex 1.5# HHA marching, hip flex,ext,abd heel raises and mini squats 10 each UBE L 1 2 min fwd/2 min backward Resisted giat 4 way 3 x each 20#    06/03/22 NuStep L4 x 6 minutes Seated hamstring stretch 3  x 15 sec each leg Long kicks- noted click with L knee ext 10 reps each Seated march 10 reps each Heel raise/toe raise 10 reps Clamshells against red tband 10 reps Flexor stretch over ball, 3 x 10 sec forward, and to each side Quick steps side to side, then forward and back, then changing direction upon therapist's command, BUE support. Mild unsteadiness.  PATIENT EDUCATION:  Education details: mechanics sit to stand,  Person educated: Patient and Child(ren) Education method: Explanation, Demonstration, and Handouts Education comprehension: verbalized understanding and returned demonstration  HOME EXERCISE PROGRAM: Access Code: [email or web address],  [id number] URL: [email or web address] Date: 04/09/2022 Prepared by: [facility]  ASSESSMENT:  CLINICAL IMPRESSION: Patient reports continued pain in R ankle, did some light strengthening. We continued with balance today and added some back strength. CGA-minA needed with all balance tasks especially on airex.    OBJECTIVE IMPAIRMENTS: Abnormal gait, decreased activity tolerance, decreased balance, decreased knowledge of use of DME, decreased mobility, difficulty walking, decreased ROM, decreased strength, increased edema, impaired flexibility, improper body mechanics, postural  dysfunction, and pain.   ACTIVITY LIMITATIONS: bending, standing, squatting, sleeping, stairs, and locomotion level  PARTICIPATION LIMITATIONS: cleaning, driving, community activity, and yard work  PERSONAL FACTORS: Age, Past/current experiences, Time since onset of injury/illness/exacerbation, and 3+ comorbidities: knee OA, lumbar DDD and scoliosis  are also affecting patient's functional outcome.   REHAB POTENTIAL: Good  CLINICAL DECISION MAKING: Evolving/moderate complexity  EVALUATION COMPLEXITY: High   GOALS: Goals reviewed with patient? Yes  SHORT TERM GOALS: Target date: 05/10/22 Pt will be independent with her initial HEP to improve LE strength and pain. Baseline: Goal status: met 05/06/22  2.  Pt will be able to demonstrate proper body mechanics with bed mobility to decrease strain on her low back. Baseline:  Goal status: met 05/06/22     LONG TERM GOALS: Target date: 09/18/22  Pt will be able to complete 5x sit to stand in less than 15 sec with or without UE support, to reflect improvements in LE mobility and strength. Baseline: 23sec, 12/14-16.7 Goal status: ongoing  2.  Pt will report atleast 40% improvement in her knee and low back pain from the start of PT. Baseline: 12/14-Back pain is slightly better, knee pain remains. Goal status: ongoing  3.  Pt will be able to ambulate with LRAD outdoors x225ft without assistance, to reflect improvement in her steadiness with ambulation. Baseline: 12/14 uses RW, walks outdoors x 20 minutes. Goal status: met  4.  Pt will be able to maintain SLS for up to 2 sec each LE, 2/3 trials. Baseline: 12/14-Stood on R x 3 sec on one attempt. All others < 2 sec on each leg. Goal status: ongoing   PLAN:  PT FREQUENCY: 2x/week  PT DURATION: 12 weeks  PLANNED INTERVENTIONS: Therapeutic exercises, Therapeutic activity, Neuromuscular re-education, Balance training, Gait training, Patient/Family education, Self Care, Joint  mobilization, Aquatic Therapy, Electrical stimulation, Cryotherapy, Manual therapy, and Re-evaluation  PLAN FOR NEXT SESSION progress LE strength, lumbar/thoracic flexibility and balance activity . Needs a lot of work on her balance  Patient Details  Name: JULITZA RICKLES MRN: [id number] Date of Birth: 1929-02-09 Referring Provider:  Wenda Low, MD  Encounter Date: 07/17/2022  Lum Babe, PT 07/15/22 5:11 PM

## 2022-07-15 NOTE — Therapy (Signed)
OUTPATIENT PHYSICAL THERAPY LOWER EXTREMITY TREATMENT   Patient Name: Danielle Dean MRN: [id number] DOB:02/08/1929, 89 y.o., female Today's Date: 07/15/2022   PT End of Session - 07/15/22 1022     Visit Number 15    Number of Visits 24    Date for PT Re-Evaluation 08/03/22    Authorization Type MCR and MCD    PT Start Time 1013    PT Stop Time 1058    PT Time Calculation (min) 45 min    Activity Tolerance Patient tolerated treatment well    Behavior During Therapy WFL for tasks assessed/performed                Past Medical History:  Diagnosis Date   Arthritis    Callus    Cataract fragments in both eyes following surgery    Corns and callosities    Hx of hysterectomy    Hypertension    Osteoarthritis    Past Surgical History:  Procedure Laterality Date   ABDOMINAL HYSTERECTOMY     NASAL SEPTUM SURGERY     ROTATOR CUFF REPAIR Right    TOTAL SHOULDER ARTHROPLASTY     Patient Active Problem List   Diagnosis Date Noted   DDD (degenerative disc disease), thoracic 11/06/2020   Other idiopathic scoliosis, thoracolumbar region 11/06/2020   DDD (degenerative disc disease), lumbar 11/06/2020   Age-related osteoporosis without current pathological fracture 11/06/2020   Porokeratosis 11/26/2018   Corns and callosities 11/26/2018   History of right shoulder replacement 10/07/2016   Chronic pain of both knees 05/20/2016   Osteoarthritis of both hands 05/20/2016   Essential hypertension 05/20/2016   Environmental allergies 05/20/2016   Primary osteoarthritis of both knees 05/19/2016   Callus of foot 11/24/2012   Pain in joint, ankle and foot 09/15/2012    PCP: Wenda Low, MD  REFERRING PROVIDER: Bo Merino, MD  REFERRING DIAG: Primary OA Bil knees, DDD lumbar, poor balance  THERAPY DIAG:  Unsteadiness on feet  Difficulty in walking, not elsewhere classified  Other abnormalities of gait and mobility  Acute bilateral low back pain without  sciatica  Muscle weakness (generalized)  Chronic pain of left knee  Chronic pain of right knee  Abnormal posture  Rationale for Evaluation and Treatment Rehabilitation  ONSET DATE: chronic issues-several years   SUBJECTIVE:   SUBJECTIVE STATEMENT: Patient reports that her R ankle is continuing to hurt, no falls PERTINENT HISTORY: Rt shoulder replacement.  DDD Lumbar/cervical spine. scoliosis PAIN:  Are you having pain? Yes: NPRS scale: 4/10 Pain location: knee/legs Pain description: aching Aggravating factors: prolonged standing, walking; low back pain with standing activity Relieving factors: sitting, wearing knee braces  PRECAUTIONS: Fall  WEIGHT BEARING RESTRICTIONS: No  FALLS:  Has patient fallen in last 6 months? No  LIVING ENVIRONMENT: Lives with: lives with their family and lives alone Lives in: House/apartment Stairs: doesn't need to use steps in her home Has following equipment at home:  rollator , SPC  OCCUPATION: retired   PLOF: Independent with basic ADLs  PATIENT GOALS: decrease pain in knees/back; improve steadiness    OBJECTIVE:   DIAGNOSTIC FINDINGS:   PATIENT SURVEYS:  FOTO: next visit  COGNITION: Overall cognitive status: Within functional limits for tasks assessed     SENSATION: Not tested denies NT  EDEMA:  None noted at eval, will sometimes have swelling in Lt ankle   MUSCLE LENGTH: Hamstrings: Right 90/90 45 deg ; Left 90/90 45 deg   POSTURE: decreased lumbar lordosis and weight shift  left trunk lateral shift Lt   PALPATION: Tenderness Rt glute max  LOWER EXTREMITY ROM:  Passive ROM Right eval Left eval  Hip flexion    Hip extension    Hip abduction    Hip adduction    Hip internal rotation    Hip external rotation    Knee flexion WNL WNL  Knee extension WNL WNL  Ankle dorsiflexion    Ankle plantarflexion    Ankle inversion    Ankle eversion     (Blank rows = not tested)   LOWER EXTREMITY MMT:  MMT  Right eval Left eval Right/left 06/26/22  Hip flexion 3+/pain 4 4-/4  Hip extension 3 3   Hip abduction 3 3 4-/4-  Hip adduction     Hip internal rotation     Hip external rotation     Knee flexion 3+ 3+ 4-/4-  Knee extension 3+ 3+/pain 4-/4- pain  Ankle dorsiflexion 4 4   Ankle plantarflexion     Ankle inversion     Ankle eversion      (Blank rows = not tested) LUMBAR ROM:   AROM eval  Flexion WNL  Extension Unable to reach neutral  Right lateral flexion   Left lateral flexion   Right rotation 75% limited  Left rotation 75% limited   (Blank rows = not tested)  ] LOWER EXTREMITY SPECIAL TESTS:    FUNCTIONAL TESTS:  5 times sit to stand: 23 sec, weight shift Lt, UE assist  BERG balance test 06/26/22  = 38/56 GAIT: Distance walked: 20 Assistive device utilized: None Level of assistance: Complete Independence Comments: minimal hip extension Lt, Lt lateral trunk shift, increased weight through Lt LE   TODAY'S TREATMENT                                       07/15/22 Nustep level 5 x 7 minutes Sit fit ankle motions Yellow tband ankle exercises 4 ways Lat pulls 10# 2x10 5# seated row 2x10 Supine feet on ball K2C, trunk rotation, small bridges and isometric abs PT gentle cervical distraction patient in supine Side step over sticks Cone toe touches Side step on and off airex On airex reaching, head turns and eyes closed  07/10/22 NuStep L4 x 6 minutes Standing, step forward with RLE, pick up a cone, then step back and ER RLE, reach back and place the cone on the mat. 5 x to each side, no instability noted. B side stepping on air ex plank 6 x each direction. Standing on air ex-static, lat weight shifts, for/back, all fairly easy for patient. Stand on air ex with narrow BOS, more challenging during weight shifts. Horizontal head turns, challenging, Vertical head turns easy. 4 square stepping over lines on floor, moving as quickly as possible in each direction. Able to  increase speed without unsteadiness. Legs fatigued. Ambulation- forward with horizontal head turns, 2 x 25', then walking backwards, 2 x 25', mildly unsteady, but no LOB, CGA. Forward bend to pick up cone at 10", turn to R and reach up to place it on a table. 5 x each direction, no unsteadiness noted Alternately tapping 4" step with each foot, no UE support, increased speed, x 30 sec, no LOB.  06/26/22 Renewal and assess Berg 38/56 MMT Nustep level 4 x 6 minutes UBE Level 1 x 4 minutes Side stepping over object Volleyball On airex head turns and reaching Cone  toe and hand touches, needed CGA Direction changes  06/05/22 Nustep L 4 6 min Lat pulls and Seated rows 10#, 2 x 10 reps Trunk ext against Black Tband, 2 x 10 reps 1.5# LE seated LAQ,hip flex and abd 2 sets 10 Standing on Airex 1.5# HHA marching, hip flex,ext,abd heel raises and mini squats 10 each UBE L 1 2 min fwd/2 min backward Resisted giat 4 way 3 x each 20#    06/03/22 NuStep L4 x 6 minutes Seated hamstring stretch 3 x 15 sec each leg Long kicks- noted click with L knee ext 10 reps each Seated march 10 reps each Heel raise/toe raise 10 reps Clamshells against red tband 10 reps Flexor stretch over ball, 3 x 10 sec forward, and to each side Quick steps side to side, then forward and back, then changing direction upon therapist's command, BUE support. Mild unsteadiness.  PATIENT EDUCATION:  Education details: mechanics sit to stand,  Person educated: Patient and Child(ren) Education method: Explanation, Demonstration, and Handouts Education comprehension: verbalized understanding and returned demonstration  HOME EXERCISE PROGRAM: Access Code: [email or web address],  [id number] URL: [email or web address] Date: 04/09/2022 Prepared by: [facility] - [facility]patient Rehab - [facility]  ASSESSMENT:  CLINICAL IMPRESSION: Patient reports continued pain in R ankle,  We continued with balance today and added  some back and core strength, I also did some gentle manual cervical distraction  OBJECTIVE IMPAIRMENTS: Abnormal gait, decreased activity tolerance, decreased balance, decreased knowledge of use of DME, decreased mobility, difficulty walking, decreased ROM, decreased strength, increased edema, impaired flexibility, improper body mechanics, postural dysfunction, and pain.   ACTIVITY LIMITATIONS: bending, standing, squatting, sleeping, stairs, and locomotion level  PARTICIPATION LIMITATIONS: cleaning, driving, community activity, and yard work  PERSONAL FACTORS: Age, Past/current experiences, Time since onset of injury/illness/exacerbation, and 3+ comorbidities: knee OA, lumbar DDD and scoliosis  are also affecting patient's functional outcome.   REHAB POTENTIAL: Good  CLINICAL DECISION MAKING: Evolving/moderate complexity  EVALUATION COMPLEXITY: High   GOALS: Goals reviewed with patient? Yes  SHORT TERM GOALS: Target date: 05/10/22 Pt will be independent with her initial HEP to improve LE strength and pain. Baseline: Goal status: met 05/06/22  2.  Pt will be able to demonstrate proper body mechanics with bed mobility to decrease strain on her low back. Baseline:  Goal status: met 05/06/22     LONG TERM GOALS: Target date: 09/18/22  Pt will be able to complete 5x sit to stand in less than 15 sec with or without UE support, to reflect improvements in LE mobility and strength. Baseline: 23sec, 12/14-16.7 Goal status: ongoing  2.  Pt will report atleast 40% improvement in her knee and low back pain from the start of PT. Baseline: 12/14-Back pain is slightly better, knee pain remains. Goal status: ongoing  3.  Pt will be able to ambulate with LRAD outdoors x260ft without assistance, to reflect improvement in her steadiness with ambulation. Baseline: 12/14 uses RW, walks outdoors x 20 minutes. Goal status: met  4.  Pt will be able to maintain SLS for up to 2 sec each LE, 2/3  trials. Baseline: 12/14-Stood on R x 3 sec on one attempt. All others < 2 sec on each leg. Goal status: ongoing   PLAN:  PT FREQUENCY: 2x/week  PT DURATION: 12 weeks  PLANNED INTERVENTIONS: Therapeutic exercises, Therapeutic activity, Neuromuscular re-education, Balance training, Gait training, Patient/Family education, Self Care, Joint mobilization, Aquatic Therapy, Electrical stimulation, Cryotherapy, Manual therapy, and Re-evaluation  PLAN FOR NEXT SESSION progress  LE strength, lumbar/thoracic flexibility and balance activity . Needs a lot of work on her balance  Patient Details  Name: Danielle Dean MRN: [id number] Date of Birth: [birth year] Referring Provider:  Wenda Low, MD  Encounter Date: 07/15/2022  Lum Babe, PT 07/15/22 10:24 AM

## 2022-07-17 ENCOUNTER — Ambulatory Visit: Payer: Medicare Other | Attending: Rheumatology

## 2022-07-17 DIAGNOSIS — G8929 Other chronic pain: Secondary | ICD-10-CM | POA: Insufficient documentation

## 2022-07-17 DIAGNOSIS — M25612 Stiffness of left shoulder, not elsewhere classified: Secondary | ICD-10-CM | POA: Diagnosis present

## 2022-07-17 DIAGNOSIS — M545 Low back pain, unspecified: Secondary | ICD-10-CM | POA: Diagnosis present

## 2022-07-17 DIAGNOSIS — M25562 Pain in left knee: Secondary | ICD-10-CM | POA: Diagnosis present

## 2022-07-17 DIAGNOSIS — R262 Difficulty in walking, not elsewhere classified: Secondary | ICD-10-CM | POA: Insufficient documentation

## 2022-07-17 DIAGNOSIS — R2681 Unsteadiness on feet: Secondary | ICD-10-CM | POA: Insufficient documentation

## 2022-07-17 DIAGNOSIS — M25561 Pain in right knee: Secondary | ICD-10-CM | POA: Insufficient documentation

## 2022-07-17 DIAGNOSIS — M6281 Muscle weakness (generalized): Secondary | ICD-10-CM | POA: Diagnosis present

## 2022-07-17 DIAGNOSIS — M25512 Pain in left shoulder: Secondary | ICD-10-CM | POA: Insufficient documentation

## 2022-07-17 DIAGNOSIS — R293 Abnormal posture: Secondary | ICD-10-CM | POA: Diagnosis present

## 2022-07-17 DIAGNOSIS — R2689 Other abnormalities of gait and mobility: Secondary | ICD-10-CM | POA: Insufficient documentation

## 2022-07-21 ENCOUNTER — Other Ambulatory Visit: Payer: Self-pay

## 2022-07-21 ENCOUNTER — Telehealth: Payer: Self-pay | Admitting: Pharmacist

## 2022-07-21 ENCOUNTER — Other Ambulatory Visit (HOSPITAL_COMMUNITY): Payer: Self-pay

## 2022-07-21 DIAGNOSIS — M810 Age-related osteoporosis without current pathological fracture: Secondary | ICD-10-CM

## 2022-07-21 MED ORDER — DENOSUMAB 60 MG/ML ~~LOC~~ SOSY
60.0000 mg | PREFILLED_SYRINGE | SUBCUTANEOUS | 0 refills | Status: DC
  Filled 2022-07-21: qty 1, 180d supply, fill #0

## 2022-07-21 NOTE — Telephone Encounter (Signed)
Patient overdue for Prolia - was due 07/15/2022. Labs on 05/19/2022 wnl. Rx sent to [facility] to be couriered to clinic by 07/28/2022.  Per test claim, copay is $4.60.  ATC patient regarding Prolia appt. Unable to reach but requested return call. If pt returns call, she can be scheduled for on or after 07/29/2022  Knox Saliva, PharmD, MPH, BCPS, CPP Clinical Pharmacist (Rheumatology and Pulmonology)

## 2022-07-22 ENCOUNTER — Ambulatory Visit: Payer: Medicare Other | Admitting: Physical Therapy

## 2022-07-22 DIAGNOSIS — R262 Difficulty in walking, not elsewhere classified: Secondary | ICD-10-CM

## 2022-07-22 DIAGNOSIS — R2681 Unsteadiness on feet: Secondary | ICD-10-CM

## 2022-07-22 DIAGNOSIS — M6281 Muscle weakness (generalized): Secondary | ICD-10-CM

## 2022-07-22 DIAGNOSIS — G8929 Other chronic pain: Secondary | ICD-10-CM

## 2022-07-22 NOTE — Therapy (Signed)
OUTPATIENT PHYSICAL THERAPY LOWER EXTREMITY TREATMENT   Patient Name: Danielle Dean MRN: [id number] DOB:1929-03-21, 89 y.o., female Today's Date: 07/22/2022   PT End of Session - 07/22/22 1016     Visit Number 17    Number of Visits 24    Date for PT Re-Evaluation 08/03/22    Authorization Type MCR and MCD    PT Start Time 1015    PT Stop Time 1100    PT Time Calculation (min) 45 min                 Past Medical History:  Diagnosis Date   Arthritis    Callus    Cataract fragments in both eyes following surgery    Corns and callosities    Hx of hysterectomy    Hypertension    Osteoarthritis    Past Surgical History:  Procedure Laterality Date   ABDOMINAL HYSTERECTOMY     NASAL SEPTUM SURGERY     ROTATOR CUFF REPAIR Right    TOTAL SHOULDER ARTHROPLASTY     Patient Active Problem List   Diagnosis Date Noted   DDD (degenerative disc disease), thoracic 11/06/2020   Other idiopathic scoliosis, thoracolumbar region 11/06/2020   DDD (degenerative disc disease), lumbar 11/06/2020   Age-related osteoporosis without current pathological fracture 11/06/2020   Porokeratosis 11/26/2018   Corns and callosities 11/26/2018   History of right shoulder replacement 10/07/2016   Chronic pain of both knees 05/20/2016   Osteoarthritis of both hands 05/20/2016   Essential hypertension 05/20/2016   Environmental allergies 05/20/2016   Primary osteoarthritis of both knees 05/19/2016   Callus of foot 11/24/2012   Pain in joint, ankle and foot 09/15/2012    PCP: Wenda Low, MD  REFERRING PROVIDER: Bo Merino, MD  REFERRING DIAG: Primary OA Bil knees, DDD lumbar, poor balance  THERAPY DIAG:  Unsteadiness on feet  Difficulty in walking, not elsewhere classified  Chronic pain of left knee  Chronic pain of right knee  Muscle weakness (generalized)  Rationale for Evaluation and Treatment Rehabilitation  ONSET DATE: chronic issues-several years   SUBJECTIVE:    SUBJECTIVE STATEMENT: No change-Still having pain in the knees and R ankle.   PERTINENT HISTORY: Rt shoulder replacement.  DDD Lumbar/cervical spine. scoliosis PAIN:  Are you having pain? Yes: NPRS scale: 4/10 Pain location: knee/legs Pain description: aching Aggravating factors: prolonged standing, walking; low back pain with standing activity Relieving factors: sitting, wearing knee braces  PRECAUTIONS: Fall  WEIGHT BEARING RESTRICTIONS: No  FALLS:  Has patient fallen in last 6 months? No  LIVING ENVIRONMENT: Lives with: lives with their family and lives alone Lives in: House/apartment Stairs: doesn't need to use steps in her home Has following equipment at home:  rollator , [facility]  OCCUPATION: retired   PLOF: Independent with basic ADLs  PATIENT GOALS: decrease pain in knees/back; improve steadiness    OBJECTIVE:   DIAGNOSTIC FINDINGS:   PATIENT SURVEYS:  FOTO: next visit  COGNITION: Overall cognitive status: Within functional limits for tasks assessed     SENSATION: Not tested denies NT  EDEMA:  None noted at eval, will sometimes have swelling in Lt ankle   MUSCLE LENGTH: Hamstrings: Right 90/90 45 deg ; Left 90/90 45 deg   POSTURE: decreased lumbar lordosis and weight shift left trunk lateral shift Lt   PALPATION: Tenderness Rt glute max  LOWER EXTREMITY ROM:  Passive ROM Right eval Left eval  Hip flexion    Hip extension    Hip abduction  Hip adduction    Hip internal rotation    Hip external rotation    Knee flexion WNL WNL  Knee extension WNL WNL  Ankle dorsiflexion    Ankle plantarflexion    Ankle inversion    Ankle eversion     (Blank rows = not tested)   LOWER EXTREMITY MMT:  MMT Right eval Left eval Right/left 06/26/22  Hip flexion 3+/pain 4 4-/4  Hip extension 3 3   Hip abduction 3 3 4-/4-  Hip adduction     Hip internal rotation     Hip external rotation     Knee flexion 3+ 3+ 4-/4-  Knee extension 3+ 3+/pain  4-/4- pain  Ankle dorsiflexion 4 4   Ankle plantarflexion     Ankle inversion     Ankle eversion      (Blank rows = not tested) LUMBAR ROM:   AROM eval  Flexion WNL  Extension Unable to reach neutral  Right lateral flexion   Left lateral flexion   Right rotation 75% limited  Left rotation 75% limited   (Blank rows = not tested)  ] LOWER EXTREMITY SPECIAL TESTS:    FUNCTIONAL TESTS:  5 times sit to stand: 23 sec, weight shift Lt, UE assist  BERG balance test 06/26/22  = 38/56 GAIT: Distance walked: 20 Assistive device utilized: None Level of assistance: Complete Independence Comments: minimal hip extension Lt, Lt lateral trunk shift, increased weight through Lt LE   TODAY'S TREATMENT                                         07/22/22 Nustep L 5 27min Black bar DF/PF 15 x each Airex marching,hip flex,ext and abd alt 20x in // bars-Foam beams in //bars Obstacles step over step and side stepping Step taps 4 inch Lat pull and seated row  07/17/22 NuStep L5 x31mins R ankle TB x10 Lat pulls downs 10# 2x10 Seated row 5# 2x10 Cone taps 20 reps alternating x2 Side steps over obstacles  Marching on airex   07/15/22 Nustep level 5 x 7 minutes Sit fit ankle motions Yellow tband ankle exercises 4 ways Lat pulls 10# 2x10 5# seated row 2x10 Supine feet on ball K2C, trunk rotation, small bridges and isometric abs PT gentle cervical distraction patient in supine Side step over sticks Cone toe touches Side step on and off airex On airex reaching, head turns and eyes closed  07/10/22 NuStep L4 x 6 minutes Standing, step forward with RLE, pick up a cone, then step back and ER RLE, reach back and place the cone on the mat. 5 x to each side, no instability noted. B side stepping on air ex plank 6 x each direction. Standing on air ex-static, lat weight shifts, for/back, all fairly easy for patient. Stand on air ex with narrow BOS, more challenging during weight shifts. Horizontal  head turns, challenging, Vertical head turns easy. 4 square stepping over lines on floor, moving as quickly as possible in each direction. Able to increase speed without unsteadiness. Legs fatigued. Ambulation- forward with horizontal head turns, 2 x 25', then walking backwards, 2 x 25', mildly unsteady, but no LOB, CGA. Forward bend to pick up cone at 10", turn to R and reach up to place it on a table. 5 x each direction, no unsteadiness noted Alternately tapping 4" step with each foot, no UE support, increased speed,  x 30 sec, no LOB.  06/26/22 Renewal and assess Danielle Dean 38/56 MMT Nustep level 4 x 6 minutes UBE Level 1 x 4 minutes Side stepping over object Volleyball On airex head turns and reaching Cone toe and hand touches, needed CGA Direction changes  06/05/22 Nustep L 4 6 min Lat pulls and Seated rows 10#, 2 x 10 reps Trunk ext against Black Tband, 2 x 10 reps 1.5# LE seated LAQ,hip flex and abd 2 sets 10 Standing on Airex 1.5# HHA marching, hip flex,ext,abd heel raises and mini squats 10 each UBE L 1 2 min fwd/2 min backward Resisted giat 4 way 3 x each 20#    06/03/22 NuStep L4 x 6 minutes Seated hamstring stretch 3 x 15 sec each leg Long kicks- noted click with L knee ext 10 reps each Seated march 10 reps each Heel raise/toe raise 10 reps Clamshells against red tband 10 reps Flexor stretch over ball, 3 x 10 sec forward, and to each side Quick steps side to side, then forward and back, then changing direction upon therapist's command, BUE support. Mild unsteadiness.  PATIENT EDUCATION:  Education details: mechanics sit to stand,  Person educated: Patient and Child(ren) Education method: Explanation, Demonstration, and Handouts Education comprehension: verbalized understanding and returned demonstration  HOME EXERCISE PROGRAM: Access Code: [email or web address],  [id number] URL: [email or web address] Date: 04/09/2022 Prepared by: [facility] - [facility]  ASSESSMENT:  CLINICAL IMPRESSION: Patient reports continued pain in R ankle, did some light strengthening. We continued with balance today . CGA-minA needed with all balance tasks especially on airex.  loud audible popping of left knee   OBJECTIVE IMPAIRMENTS: Abnormal gait, decreased activity tolerance, decreased balance, decreased knowledge of use of DME, decreased mobility, difficulty walking, decreased ROM, decreased strength, increased edema, impaired flexibility, improper body mechanics, postural dysfunction, and pain.   ACTIVITY LIMITATIONS: bending, standing, squatting, sleeping, stairs, and locomotion level  PARTICIPATION LIMITATIONS: cleaning, driving, community activity, and yard work  PERSONAL FACTORS: Age, Past/current experiences, Time since onset of injury/illness/exacerbation, and 3+ comorbidities: knee OA, lumbar DDD and scoliosis  are also affecting patient's functional outcome.   REHAB POTENTIAL: Good  CLINICAL DECISION MAKING: Evolving/moderate complexity  EVALUATION COMPLEXITY: High   GOALS: Goals reviewed with patient? Yes  SHORT TERM GOALS: Target date: 05/10/22 Pt will be independent with her initial HEP to improve LE strength and pain. Baseline: Goal status: met 05/06/22  2.  Pt will be able to demonstrate proper body mechanics with bed mobility to decrease strain on her low back. Baseline:  Goal status: met 05/06/22     LONG TERM GOALS: Target date: 09/18/22  Pt will be able to complete 5x sit to stand in less than 15 sec with or without UE support, to reflect improvements in LE mobility and strength. Baseline: 23sec, 12/14-16.7 Goal status: ongoing  2.  Pt will report atleast 40% improvement in her knee and low back pain from the start of PT. Baseline: 12/14-Back pain is slightly better, knee pain remains. Goal status: ongoing  3.  Pt will be able to ambulate with LRAD outdoors x238ft without assistance, to reflect  improvement in her steadiness with ambulation. Baseline: 12/14 uses RW, walks outdoors x 20 minutes. Goal status: met  4.  Pt will be able to maintain SLS for up to 2 sec each LE, 2/3 trials. Baseline: 12/14-Stood on R x 3 sec on one attempt. All others < 2 sec on each leg. Goal status: ongoing   PLAN:  PT FREQUENCY: 2x/week  PT DURATION: 12 weeks  PLANNED INTERVENTIONS: Therapeutic exercises, Therapeutic activity, Neuromuscular re-education, Balance training, Gait training, Patient/Family education, Self Care, Joint mobilization, Aquatic Therapy, Electrical stimulation, Cryotherapy, Manual therapy, and Re-evaluation  PLAN FOR NEXT SESSION progress LE strength, lumbar/thoracic flexibility and balance activity . Needs a lot of work on her balance. Assess goals  Patient Details  Name: Danielle Dean MRN: [id number] Date of Birth: 1928/11/11 Referring Provider:  Wenda Low, MD  Encounter Date: 07/22/2022  Angie Shantaya Bluestone PTA 07/22/22 10:19 AM [place] [facility] [place]. [place], [place], [address] Phone: [phone]   Fax:  [phone]  Patient Details  Name: Danielle Dean MRN: [id number] Date of Birth: [birth year] Referring Provider:  Wenda Low, MD  Encounter Date: 07/22/2022   Laqueta Carina, PTA 07/22/2022, 10:17 AM  [place] [facility] [place]. [place], [place], [address] Phone: [phone]   Fax:  [phone]

## 2022-07-22 NOTE — Telephone Encounter (Signed)
Patient scheduled for Prolia appt on 07/28/2022  Knox Saliva, PharmD, MPH, BCPS, CPP Clinical Pharmacist (Rheumatology and Pulmonology)

## 2022-07-23 NOTE — Therapy (Signed)
OUTPATIENT PHYSICAL THERAPY LOWER EXTREMITY TREATMENT   Patient Name: Danielle Dean MRN: [id number] DOB:11/21/1928, 89 y.o., female Today's Date: 07/24/2022   PT End of Session - 07/24/22 1014     Visit Number 18    Number of Visits 24    Date for PT Re-Evaluation 08/03/22    Authorization Type MCR and MCD    PT Start Time 1015    PT Stop Time 1100    PT Time Calculation (min) 45 min    Activity Tolerance Patient tolerated treatment well;Patient limited by pain    Behavior During Therapy WFL for tasks assessed/performed                 Past Medical History:  Diagnosis Date   Arthritis    Callus    Cataract fragments in both eyes following surgery    Corns and callosities    Hx of hysterectomy    Hypertension    Osteoarthritis    Past Surgical History:  Procedure Laterality Date   ABDOMINAL HYSTERECTOMY     NASAL SEPTUM SURGERY     ROTATOR CUFF REPAIR Right    TOTAL SHOULDER ARTHROPLASTY     Patient Active Problem List   Diagnosis Date Noted   DDD (degenerative disc disease), thoracic 11/06/2020   Other idiopathic scoliosis, thoracolumbar region 11/06/2020   DDD (degenerative disc disease), lumbar 11/06/2020   Age-related osteoporosis without current pathological fracture 11/06/2020   Porokeratosis 11/26/2018   Corns and callosities 11/26/2018   History of right shoulder replacement 10/07/2016   Chronic pain of both knees 05/20/2016   Osteoarthritis of both hands 05/20/2016   Essential hypertension 05/20/2016   Environmental allergies 05/20/2016   Primary osteoarthritis of both knees 05/19/2016   Callus of foot 11/24/2012   Pain in joint, ankle and foot 09/15/2012    PCP: Wenda Low, MD  REFERRING PROVIDER: Bo Merino, MD  REFERRING DIAG: Primary OA Bil knees, DDD lumbar, poor balance  THERAPY DIAG:  Unsteadiness on feet  Difficulty in walking, not elsewhere classified  Chronic pain of left knee  Chronic pain of right  knee  Muscle weakness (generalized)  Other abnormalities of gait and mobility  Rationale for Evaluation and Treatment Rehabilitation  ONSET DATE: chronic issues-several years   SUBJECTIVE:   SUBJECTIVE STATEMENT: No change-Still having pain in the knees and R ankle and in my left side ribs and back.   PERTINENT HISTORY: Rt shoulder replacement.  DDD Lumbar/cervical spine. scoliosis PAIN:  Are you having pain? Yes: NPRS scale: 4/10 Pain location: knee/legs Pain description: aching Aggravating factors: prolonged standing, walking; low back pain with standing activity Relieving factors: sitting, wearing knee braces  PRECAUTIONS: Fall  WEIGHT BEARING RESTRICTIONS: No  FALLS:  Has patient fallen in last 6 months? No  LIVING ENVIRONMENT: Lives with: lives with their family and lives alone Lives in: House/apartment Stairs: doesn't need to use steps in her home Has following equipment at home:  rollator , SPC  OCCUPATION: retired   PLOF: Independent with basic ADLs  PATIENT GOALS: decrease pain in knees/back; improve steadiness    OBJECTIVE:   DIAGNOSTIC FINDINGS:   PATIENT SURVEYS:  FOTO: next visit  COGNITION: Overall cognitive status: Within functional limits for tasks assessed     SENSATION: Not tested denies NT  EDEMA:  None noted at eval, will sometimes have swelling in Lt ankle   MUSCLE LENGTH: Hamstrings: Right 90/90 45 deg ; Left 90/90 45 deg   POSTURE: decreased lumbar lordosis and weight  shift left trunk lateral shift Lt   PALPATION: Tenderness Rt glute max  LOWER EXTREMITY ROM:  Passive ROM Right eval Left eval  Hip flexion    Hip extension    Hip abduction    Hip adduction    Hip internal rotation    Hip external rotation    Knee flexion WNL WNL  Knee extension WNL WNL  Ankle dorsiflexion    Ankle plantarflexion    Ankle inversion    Ankle eversion     (Blank rows = not tested)   LOWER EXTREMITY MMT:  MMT Right eval  Left eval Right/left 06/26/22  Hip flexion 3+/pain 4 4-/4  Hip extension 3 3   Hip abduction 3 3 4-/4-  Hip adduction     Hip internal rotation     Hip external rotation     Knee flexion 3+ 3+ 4-/4-  Knee extension 3+ 3+/pain 4-/4- pain  Ankle dorsiflexion 4 4   Ankle plantarflexion     Ankle inversion     Ankle eversion      (Blank rows = not tested) LUMBAR ROM:   AROM eval  Flexion WNL  Extension Unable to reach neutral  Right lateral flexion   Left lateral flexion   Right rotation 75% limited  Left rotation 75% limited   (Blank rows = not tested)  ] LOWER EXTREMITY SPECIAL TESTS:    FUNCTIONAL TESTS:  5 times sit to stand: 23 sec, weight shift Lt, UE assist  BERG balance test 06/26/22  = 38/56 GAIT: Distance walked: 20 Assistive device utilized: None Level of assistance: Complete Independence Comments: minimal hip extension Lt, Lt lateral trunk shift, increased weight through Lt LE   TODAY'S TREATMENT                                       07/24/22 NuStep L5x44mins Calf stretch 30s  Calf raises x15 Toes raises x15  Seated ball roll out x10 forwards and then each side 5xSTS re-test STS on airex x10 Marching on airex 20 reps Catch standing on airex, then with feet together  07/22/22 Nustep L 5 28min Black bar DF/PF 15 x each Airex marching,hip flex,ext and abd alt 20x in // bars-Foam beams in //bars Obstacles step over step and side stepping Step taps 4 inch Lat pull and seated row  07/17/22 NuStep L5 x91mins R ankle TB x10 Lat pulls downs 10# 2x10 Seated row 5# 2x10 Cone taps 20 reps alternating x2 Side steps over obstacles  Marching on airex   07/15/22 Nustep level 5 x 7 minutes Sit fit ankle motions Yellow tband ankle exercises 4 ways Lat pulls 10# 2x10 5# seated row 2x10 Supine feet on ball K2C, trunk rotation, small bridges and isometric abs PT gentle cervical distraction patient in supine Side step over sticks Cone toe touches Side step on and  off airex On airex reaching, head turns and eyes closed  07/10/22 NuStep L4 x 6 minutes Standing, step forward with RLE, pick up a cone, then step back and ER RLE, reach back and place the cone on the mat. 5 x to each side, no instability noted. B side stepping on air ex plank 6 x each direction. Standing on air ex-static, lat weight shifts, for/back, all fairly easy for patient. Stand on air ex with narrow BOS, more challenging during weight shifts. Horizontal head turns, challenging, Vertical head turns easy. 4  square stepping over lines on floor, moving as quickly as possible in each direction. Able to increase speed without unsteadiness. Legs fatigued. Ambulation- forward with horizontal head turns, 2 x 25', then walking backwards, 2 x 25', mildly unsteady, but no LOB, CGA. Forward bend to pick up cone at 10", turn to R and reach up to place it on a table. 5 x each direction, no unsteadiness noted Alternately tapping 4" step with each foot, no UE support, increased speed, x 30 sec, no LOB.  06/26/22 Renewal and assess Danielle Dean 38/56 MMT Nustep level 4 x 6 minutes UBE Level 1 x 4 minutes Side stepping over object Volleyball On airex head turns and reaching Cone toe and hand touches, needed CGA Direction changes  06/05/22 Nustep L 4 6 min Lat pulls and Seated rows 10#, 2 x 10 reps Trunk ext against Black Tband, 2 x 10 reps 1.5# LE seated LAQ,hip flex and abd 2 sets 10 Standing on Airex 1.5# HHA marching, hip flex,ext,abd heel raises and mini squats 10 each UBE L 1 2 min fwd/2 min backward Resisted giat 4 way 3 x each 20#    06/03/22 NuStep L4 x 6 minutes Seated hamstring stretch 3 x 15 sec each leg Long kicks- noted click with L knee ext 10 reps each Seated march 10 reps each Heel raise/toe raise 10 reps Clamshells against red tband 10 reps Flexor stretch over ball, 3 x 10 sec forward, and to each side Quick steps side to side, then forward and back, then changing direction  upon therapist's command, BUE support. Mild unsteadiness.  PATIENT EDUCATION:  Education details: mechanics sit to stand,  Person educated: Patient and Child(ren) Education method: Explanation, Demonstration, and Handouts Education comprehension: verbalized understanding and returned demonstration  HOME EXERCISE PROGRAM: Access Code: [email or web address],  [email or web address] URL: [email or web address] Date: 04/09/2022 Prepared by: [facility] - Outpatient Rehab - [facility]  ASSESSMENT:  CLINICAL IMPRESSION: Patient reports continued pain in R ankle and today has more pain in her back. Loud popping of left knee with marching on airex.    OBJECTIVE IMPAIRMENTS: Abnormal gait, decreased activity tolerance, decreased balance, decreased knowledge of use of DME, decreased mobility, difficulty walking, decreased ROM, decreased strength, increased edema, impaired flexibility, improper body mechanics, postural dysfunction, and pain.   ACTIVITY LIMITATIONS: bending, standing, squatting, sleeping, stairs, and locomotion level  PARTICIPATION LIMITATIONS: cleaning, driving, community activity, and yard work  PERSONAL FACTORS: Age, Past/current experiences, Time since onset of injury/illness/exacerbation, and 3+ comorbidities: knee OA, lumbar DDD and scoliosis  are also affecting patient's functional outcome.   REHAB POTENTIAL: Good  CLINICAL DECISION MAKING: Evolving/moderate complexity  EVALUATION COMPLEXITY: High   GOALS: Goals reviewed with patient? Yes  SHORT TERM GOALS: Target date: 05/10/22 Pt will be independent with her initial HEP to improve LE strength and pain. Baseline: Goal status: met 05/06/22  2.  Pt will be able to demonstrate proper body mechanics with bed mobility to decrease strain on her low back. Baseline:  Goal status: met 05/06/22   LONG TERM GOALS: Target date: 09/18/22  Pt will be able to complete 5x sit to stand in less than 15 sec with or without UE  support, to reflect improvements in LE mobility and strength. Baseline: 23sec, 12/14-16.7, 07/24/22-16.10 Goal status: ongoing  2.  Pt will report atleast 40% improvement in her knee and low back pain from the start of PT. Baseline: 12/14-Back pain is slightly better, knee pain remains. Goal status: ongoing  3.  Pt will  be able to ambulate with LRAD outdoors x272ft without assistance, to reflect improvement in her steadiness with ambulation. Baseline: 12/14 uses RW, walks outdoors x 20 minutes. Goal status: met  4.  Pt will be able to maintain SLS for up to 2 sec each LE, 2/3 trials. Baseline: 12/14-Stood on R x 3 sec on one attempt. All others < 2 sec on each leg. Goal status: ongoing   PLAN:  PT FREQUENCY: 2x/week  PT DURATION: 12 weeks  PLANNED INTERVENTIONS: Therapeutic exercises, Therapeutic activity, Neuromuscular re-education, Balance training, Gait training, Patient/Family education, Self Care, Joint mobilization, Aquatic Therapy, Electrical stimulation, Cryotherapy, Manual therapy, and Re-evaluation  PLAN FOR NEXT SESSION progress LE strength, lumbar/thoracic flexibility and balance activity . Needs a lot of work on her balance. Assess goals  Patient Details  Name: Danielle Dean MRN: [id number] Date of Birth: [birth year] Referring Provider:  Wenda Low, MD  Encounter Date: 07/24/2022  Angie Payseur PTA 07/24/22 10:54 AM [facility] [place]. [place], [place], [address] Phone: [phone]   Fax:  [phone]  Patient Details  Name: Danielle Dean MRN: [id number] Date of Birth: 1928/07/22 Referring Provider:  Wenda Low, MD  Encounter Date: 07/24/2022   Andris Baumann, PT 07/24/2022, 10:54 AM  [facility] [place]. [place], [place], [address] Phone: [phone]   Fax:  [phone]

## 2022-07-24 ENCOUNTER — Ambulatory Visit: Payer: Medicare Other

## 2022-07-24 DIAGNOSIS — R2681 Unsteadiness on feet: Secondary | ICD-10-CM | POA: Diagnosis not present

## 2022-07-24 DIAGNOSIS — M6281 Muscle weakness (generalized): Secondary | ICD-10-CM

## 2022-07-24 DIAGNOSIS — G8929 Other chronic pain: Secondary | ICD-10-CM

## 2022-07-24 DIAGNOSIS — R262 Difficulty in walking, not elsewhere classified: Secondary | ICD-10-CM

## 2022-07-24 DIAGNOSIS — R2689 Other abnormalities of gait and mobility: Secondary | ICD-10-CM

## 2022-07-24 NOTE — Telephone Encounter (Signed)
Prolia received from [facility]. Placed in [place], PharmD, MPH, BCPS, CPP Clinical Pharmacist (Rheumatology and Pulmonology)

## 2022-07-28 ENCOUNTER — Ambulatory Visit: Payer: Medicare Other | Attending: Rheumatology | Admitting: Pharmacist

## 2022-07-28 DIAGNOSIS — M810 Age-related osteoporosis without current pathological fracture: Secondary | ICD-10-CM

## 2022-07-28 DIAGNOSIS — Z7689 Persons encountering health services in other specified circumstances: Secondary | ICD-10-CM | POA: Diagnosis present

## 2022-07-28 MED ORDER — DENOSUMAB 60 MG/ML ~~LOC~~ SOSY
60.0000 mg | PREFILLED_SYRINGE | Freq: Once | SUBCUTANEOUS | Status: AC
  Administered 2022-07-28: 60 mg via SUBCUTANEOUS

## 2022-07-28 NOTE — Progress Notes (Signed)
Pharmacy Note  Subjective:   Patient presents to clinic today to receive bi-annual dose of Prolia. She is accompanied by her daughter. Patient's last dose of Prolia was on 01/16/2022. Patient reports that since she saw Dr. Estanislado Pandy, she has had new right-sided arthralgias and muscle pains. At last OV with Dr. Estanislado Pandy on  Unfortunately Dr. Estanislado Pandy is not in clinic today to advise.  Patient running a fever or have signs/symptoms of infection? No  Patient currently on antibiotics for the treatment of infection? No  Patient had fall in the last 6 months?  No    Patient taking calcium 1200 mg daily through diet or supplement and at least 800 units vitamin D? Yes  Objective: CMP     Component Value Date/Time   NA 130 (L) 01/07/2022 1109   K 5.1 01/07/2022 1109   CL 98 01/07/2022 1109   CO2 26 01/07/2022 1109   GLUCOSE 92 01/07/2022 1109   BUN 21 01/07/2022 1109   CREATININE 0.69 01/07/2022 1109   CALCIUM 8.9 01/07/2022 1109   PROT 6.3 01/07/2022 1109   ALBUMIN 3.6 12/20/2020 1422   AST 15 01/07/2022 1109   ALT 13 01/07/2022 1109   ALKPHOS 50 12/20/2020 1422   BILITOT 0.5 01/07/2022 1109   GFRNONAA >60 12/20/2020 1422   GFRNONAA 70 06/14/2020 0000   GFRAA 81 06/14/2020 0000    CBC    Component Value Date/Time   WBC 7.5 01/07/2022 1109   RBC 3.67 (L) 01/07/2022 1109   HGB 11.2 (L) 01/07/2022 1109   HCT 33.7 (L) 01/07/2022 1109   PLT 219 01/07/2022 1109   MCV 91.8 01/07/2022 1109   MCH 30.5 01/07/2022 1109   MCHC 33.2 01/07/2022 1109   RDW 11.8 01/07/2022 1109   LYMPHSABS 1,778 01/07/2022 1109   MONOABS 0.8 12/20/2020 1422   EOSABS 128 01/07/2022 1109   BASOSABS 38 01/07/2022 1109    Lab Results  Component Value Date   VD25OH 55 07/10/2021    T-score: DEXA on 05/04/20: 1/3 Left distal radius BMD 0.421 with T-score -4.6.   Assessment/Plan:   Reviewed importance of adequate dietary intake of calcium in addition to supplementation due to risk of hypocalcemia with  Prolia.   Patient tolerated injection  without issue.   Administrations This Visit     denosumab (PROLIA) injection 60 mg     Admin Date 07/28/2022 Action Given Dose 60 mg Route Subcutaneous Administered By Cassandria Anger, RPH-CPP           Patient's next Prolia dose is due on 01/24/2023.  Patient is due for updated DEXA but patient wanted to hold off it appears from last OV note.   All questions encouraged and answered.  Instructed patient to call with any further questions or concerns.  [place]ox Saliva, PharmD, MPH, BCPS, CPP Clinical Pharmacist (Rheumatology and Pulmonology)

## 2022-07-29 NOTE — Progress Notes (Unsigned)
Office Visit Note  Patient: Danielle Dean             Date of Birth: 11-28-1928           MRN: [id number]             PCP: Danielle Low, MD Referring: Danielle Low, MD Visit Date: 07/31/2022 Occupation: @GUAROCC$ @  Subjective:  Thoracic pain and right ankle pain  History of Present Illness: Danielle Dean is a 89 y.o. female with history of osteoarthritis and osteoporosis.  She was seen today on an urgent basis.  She was accompanied by her son today.  According to her she has been experiencing pain and discomfort in her right thoracic region which she describes in the right paravertebral region.  She has been going to physical therapy.  She also has been experiencing pain and swelling in her right ankle.  She states she tried an ankle brace which did not help much.  She has been having difficulty walking.  She also has pain and discomfort in her right knee joint.  She had good response to left knee joint cortisone injection.  She continues to have lower back pain.    Activities of Daily Living:  Patient reports morning stiffness for 0 minute.   Patient Reports nocturnal pain.  Difficulty dressing/grooming: Denies Difficulty climbing stairs: Reports Difficulty getting out of chair: Reports Difficulty using hands for taps, buttons, cutlery, and/or writing: Reports  Review of Systems  Constitutional:  Negative for fatigue.  HENT:  Negative for mouth sores and mouth dryness.   Eyes:  Positive for dryness.  Respiratory:  Negative for shortness of breath.   Cardiovascular:  Negative for chest pain and palpitations.  Gastrointestinal:  Negative for blood in stool, constipation and diarrhea.  Endocrine: Positive for increased urination.  Genitourinary:  Negative for involuntary urination.  Musculoskeletal:  Positive for joint pain, gait problem, joint pain, joint swelling, myalgias, muscle weakness and myalgias. Negative for morning stiffness and muscle tenderness.  Skin:   Positive for sensitivity to sunlight. Negative for color change, rash and hair loss.  Allergic/Immunologic: Positive for susceptible to infections.  Neurological:  Negative for dizziness and headaches.  Hematological:  Negative for swollen glands.  Psychiatric/Behavioral:  Positive for sleep disturbance. Negative for depressed mood. The patient is nervous/anxious.     PMFS History:  Patient Active Problem List   Diagnosis Date Noted   DDD (degenerative disc disease), thoracic 11/06/2020   Other idiopathic scoliosis, thoracolumbar region 11/06/2020   DDD (degenerative disc disease), lumbar 11/06/2020   Age-related osteoporosis without current pathological fracture 11/06/2020   Porokeratosis 11/26/2018   Corns and callosities 11/26/2018   History of right shoulder replacement 10/07/2016   Chronic pain of both knees 05/20/2016   Osteoarthritis of both hands 05/20/2016   Essential hypertension 05/20/2016   Environmental allergies 05/20/2016   Primary osteoarthritis of both knees 05/19/2016   Callus of foot 11/24/2012   Pain in joint, ankle and foot 09/15/2012    Past Medical History:  Diagnosis Date   Arthritis    Callus    Cataract fragments in both eyes following surgery    Corns and callosities    Hx of hysterectomy    Hypertension    Osteoarthritis     Family History  Problem Relation Age of Onset   Arthritis Mother    Diabetes Father    Past Surgical History:  Procedure Laterality Date   ABDOMINAL HYSTERECTOMY     NASAL SEPTUM SURGERY  ROTATOR CUFF REPAIR Right    TOTAL SHOULDER ARTHROPLASTY     Social History   Social History Narrative   Not on file   Immunization History  Administered Date(s) Administered   PFIZER(Purple Top)SARS-COV-2 Vaccination 07/06/2019, 07/27/2019, 03/10/2020     Objective: Vital Signs: BP (!) 152/73 (BP Location: Left Arm, Patient Position: Sitting, Cuff Size: Normal)   Pulse 65   Resp 12   Ht 5' (1.524 m)   Wt 102 lb (46.3  kg)   BMI 19.92 kg/m    Physical Exam Vitals and nursing note reviewed.  Constitutional:      Appearance: She is well-developed.  HENT:     Head: Normocephalic and atraumatic.  Eyes:     Conjunctiva/sclera: Conjunctivae normal.  Cardiovascular:     Rate and Rhythm: Normal rate and regular rhythm.     Heart sounds: Normal heart sounds.  Pulmonary:     Effort: Pulmonary effort is normal.     Breath sounds: Normal breath sounds.  Abdominal:     General: Bowel sounds are normal.     Palpations: Abdomen is soft.  Musculoskeletal:     Cervical back: Normal range of motion.  Lymphadenopathy:     Cervical: No cervical adenopathy.  Skin:    General: Skin is warm and dry.     Capillary Refill: Capillary refill takes less than 2 seconds.  Neurological:     Mental Status: She is alert and oriented to person, place, and time.  Psychiatric:        Behavior: Behavior normal.      Musculoskeletal Exam: Cervical spine was in good range of motion.  Right shoulder joint abduction and forward flexion was limited to about 110 degrees.  She had limited internal rotation.  Left shoulder joint was in full range of motion.  She had no point tenderness over the thoracic region or the rib cage region.  Elbow joints, wrist joints, MCPs PIPs and DIPs been good range of motion.  She had bilateral PIP and DIP thickening consistent with osteoarthritis.  Hip joints were in good range of motion.  Knee joints in good range of motion.  She had discomfort range of motion of the right knee joint.  She had tenderness over right ankle joint and pedal edema over the right lower extremity.  CDAI Exam: CDAI Score: -- Patient Global: --; Provider Global: -- Swollen: --; Tender: -- Joint Exam 07/31/2022   No joint exam has been documented for this visit   There is currently no information documented on the homunculus. Go to the Rheumatology activity and complete the homunculus joint exam.  Investigation: No  additional findings.  Imaging: No results found.  Recent Labs: Lab Results  Component Value Date   WBC 7.5 01/07/2022   HGB 11.2 (L) 01/07/2022   PLT 219 01/07/2022   NA 130 (L) 01/07/2022   K 5.1 01/07/2022   CL 98 01/07/2022   CO2 26 01/07/2022   GLUCOSE 92 01/07/2022   BUN 21 01/07/2022   CREATININE 0.69 01/07/2022   BILITOT 0.5 01/07/2022   ALKPHOS 50 12/20/2020   AST 15 01/07/2022   ALT 13 01/07/2022   PROT 6.3 01/07/2022   ALBUMIN 3.6 12/20/2020   CALCIUM 8.9 01/07/2022   GFRAA 81 06/14/2020    Speciality Comments: Prolia: 06/27/20, 01/16/21,07/18/2021,01/16/22  Procedures:  Large Joint Inj: R knee on 07/31/2022 10:00 AM Indications: pain Details: 27 G 1.5 in needle, medial approach  Arthrogram: No  Medications: 40 mg triamcinolone acetonide  40 MG/ML; 1.5 mL lidocaine 1 % Aspirate: 0 mL Outcome: tolerated well, no immediate complications Procedure, treatment alternatives, risks and benefits explained, specific risks discussed. Consent was given by the patient. Immediately prior to procedure a time out was called to verify the correct patient, procedure, equipment, support staff and site/side marked as required. Patient was prepped and draped in the usual sterile fashion.     Allergies: Actonel [risedronate], Fosamax [alendronate], and Lisinopril   Assessment / Plan:     Visit Diagnoses: Pain in right ankle and joints of right foot-she complains of pain and discomfort in her right ankle and right foot.  There is no history of injury.  Pitting edema was noted on the right lower extremity.  She had no calf tenderness.  Use of compression socks was discussed.  X-rays of right ankle joint obtained today were within normal limits.  X-ray findings were reviewed with the patient.  X-rays of right foot showed osteoarthritic changes.  No acute pathology was noted.  X-ray findings were reviewed with the patient and her son.  DDD (degenerative disc disease), thoracic-she has  been experiencing pain in the right paravertebral region.  She is going to physical therapy.  Stretching exercises were emphasized.  Hyponatremia-according to patient's son she was evaluated by nephrologist.  Repeat serum sodium was normal.  Pedal edema-she has been with her.  She was advised diuretics by the nephrologist.  Age-related osteoporosis without current pathological fracture-May 04, 2020 DEXA scan one third left distal radius BMD 0.421 and T-score -4.6.  She has been on Prolia since January 2022.  Last Prolia injection was January 16, 2022.  She is on calcium and vitamin D.  Encounter for medication administration  Chronic left shoulder pain-she has intermittent discomfort.  Rupture of left biceps tendon, sequela  History of right shoulder replacement-she had limited range of motion of shoulder joint without much discomfort.  Primary osteoarthritis of both hands-she is severely arthritis in her bilateral hands.  Primary osteoarthritis of both knees-she had good wrist once to cortisone injection in the left knee joint given in January.  She had last cortisone injection in the right knee in September 2023.  Patient requested repeat cortisone injection.  After informed consent was obtained and different side effects were discussed right knee joint was injected with lidocaine and cortisone as described above.  Patient tolerated the procedure well.  Postprocedure instructions were given.  DDD (degenerative disc disease), lumbar-she has chronic pain.  She is going to physical therapy.  Other idiopathic scoliosis, thoracolumbar region  Poor balance-she lives with the help of a cane.  History of hypertension-her blood pressure was elevated at 152/73.  Blood pressure was repeated.  Orders: Orders Placed This Encounter  Procedures   Large Joint Inj   No orders of the defined types were placed in this encounter.    Follow-Up Instructions: Return for Osteoarthritis.   Bo Merino, MD  Note - This record has been created using Editor, commissioning.  Chart creation errors have been sought, but may not always  have been located. Such creation errors do not reflect on  the standard of medical care.

## 2022-07-31 ENCOUNTER — Ambulatory Visit (INDEPENDENT_AMBULATORY_CARE_PROVIDER_SITE_OTHER): Payer: Medicare Other

## 2022-07-31 ENCOUNTER — Encounter: Payer: Self-pay | Admitting: Rheumatology

## 2022-07-31 ENCOUNTER — Ambulatory Visit: Payer: Medicare Other | Attending: Rheumatology | Admitting: Rheumatology

## 2022-07-31 ENCOUNTER — Ambulatory Visit: Payer: Medicare Other

## 2022-07-31 VITALS — BP 162/72 | HR 65 | Resp 12 | Ht 60.0 in | Wt 102.0 lb

## 2022-07-31 DIAGNOSIS — Z96611 Presence of right artificial shoulder joint: Secondary | ICD-10-CM | POA: Diagnosis present

## 2022-07-31 DIAGNOSIS — M810 Age-related osteoporosis without current pathological fracture: Secondary | ICD-10-CM | POA: Diagnosis present

## 2022-07-31 DIAGNOSIS — M4125 Other idiopathic scoliosis, thoracolumbar region: Secondary | ICD-10-CM | POA: Insufficient documentation

## 2022-07-31 DIAGNOSIS — M19042 Primary osteoarthritis, left hand: Secondary | ICD-10-CM

## 2022-07-31 DIAGNOSIS — M25571 Pain in right ankle and joints of right foot: Secondary | ICD-10-CM | POA: Insufficient documentation

## 2022-07-31 DIAGNOSIS — Z7689 Persons encountering health services in other specified circumstances: Secondary | ICD-10-CM | POA: Diagnosis present

## 2022-07-31 DIAGNOSIS — M5134 Other intervertebral disc degeneration, thoracic region: Secondary | ICD-10-CM

## 2022-07-31 DIAGNOSIS — G8929 Other chronic pain: Secondary | ICD-10-CM | POA: Insufficient documentation

## 2022-07-31 DIAGNOSIS — M25512 Pain in left shoulder: Secondary | ICD-10-CM | POA: Diagnosis present

## 2022-07-31 DIAGNOSIS — R600 Localized edema: Secondary | ICD-10-CM | POA: Insufficient documentation

## 2022-07-31 DIAGNOSIS — Z8679 Personal history of other diseases of the circulatory system: Secondary | ICD-10-CM | POA: Diagnosis present

## 2022-07-31 DIAGNOSIS — R2689 Other abnormalities of gait and mobility: Secondary | ICD-10-CM

## 2022-07-31 DIAGNOSIS — E871 Hypo-osmolality and hyponatremia: Secondary | ICD-10-CM

## 2022-07-31 DIAGNOSIS — S46212S Strain of muscle, fascia and tendon of other parts of biceps, left arm, sequela: Secondary | ICD-10-CM | POA: Insufficient documentation

## 2022-07-31 DIAGNOSIS — M19041 Primary osteoarthritis, right hand: Secondary | ICD-10-CM | POA: Insufficient documentation

## 2022-07-31 DIAGNOSIS — M170 Bilateral primary osteoarthritis of knee: Secondary | ICD-10-CM | POA: Diagnosis present

## 2022-07-31 DIAGNOSIS — M5136 Other intervertebral disc degeneration, lumbar region: Secondary | ICD-10-CM | POA: Insufficient documentation

## 2022-07-31 MED ORDER — TRIAMCINOLONE ACETONIDE 40 MG/ML IJ SUSP
40.0000 mg | INTRAMUSCULAR | Status: AC | PRN
  Administered 2022-07-31: 40 mg via INTRA_ARTICULAR

## 2022-07-31 MED ORDER — LIDOCAINE HCL 1 % IJ SOLN
1.5000 mL | INTRAMUSCULAR | Status: AC | PRN
  Administered 2022-07-31: 1.5 mL

## 2022-08-05 ENCOUNTER — Ambulatory Visit: Payer: Medicare Other

## 2022-08-05 DIAGNOSIS — R2681 Unsteadiness on feet: Secondary | ICD-10-CM

## 2022-08-05 DIAGNOSIS — M6281 Muscle weakness (generalized): Secondary | ICD-10-CM

## 2022-08-05 DIAGNOSIS — R262 Difficulty in walking, not elsewhere classified: Secondary | ICD-10-CM

## 2022-08-05 DIAGNOSIS — G8929 Other chronic pain: Secondary | ICD-10-CM

## 2022-08-05 NOTE — Therapy (Signed)
OUTPATIENT PHYSICAL THERAPY LOWER EXTREMITY REASSESSMENT Progress Note Reporting Period 07/03/22 to 08/03/22  See note below for Objective Data and Assessment of Progress/Goals.      Patient Name: Danielle Dean MRN: [id number] DOB:31-Jul-1928, 89 y.o., female Today's Date: 08/05/2022   PT End of Session - 08/05/22 1702     Visit Number 19    Number of Visits 24    Date for PT Re-Evaluation 09/02/22    Authorization Type MCR and MCD    Authorization Time Period 04/09/22 to 07/02/22    Progress Note Due on Visit 10                 Past Medical History:  Diagnosis Date   Arthritis    Callus    Cataract fragments in both eyes following surgery    Corns and callosities    Hx of hysterectomy    Hypertension    Osteoarthritis    Past Surgical History:  Procedure Laterality Date   ABDOMINAL HYSTERECTOMY     NASAL SEPTUM SURGERY     ROTATOR CUFF REPAIR Right    TOTAL SHOULDER ARTHROPLASTY     Patient Active Problem List   Diagnosis Date Noted   DDD (degenerative disc disease), thoracic 11/06/2020   Other idiopathic scoliosis, thoracolumbar region 11/06/2020   DDD (degenerative disc disease), lumbar 11/06/2020   Age-related osteoporosis without current pathological fracture 11/06/2020   Porokeratosis 11/26/2018   Corns and callosities 11/26/2018   History of right shoulder replacement 10/07/2016   Chronic pain of both knees 05/20/2016   Osteoarthritis of both hands 05/20/2016   Essential hypertension 05/20/2016   Environmental allergies 05/20/2016   Primary osteoarthritis of both knees 05/19/2016   Callus of foot 11/24/2012   Pain in joint, ankle and foot 09/15/2012    PCP: Wenda Low, MD  REFERRING PROVIDER: Bo Merino, MD  REFERRING DIAG: Primary OA Bil knees, DDD lumbar, poor balance  THERAPY DIAG:  Unsteadiness on feet  Difficulty in walking, not elsewhere classified  Chronic pain of left knee  Chronic pain of right knee  Muscle  weakness (generalized)  Rationale for Evaluation and Treatment Rehabilitation  ONSET DATE: chronic issues-several years   SUBJECTIVE:   SUBJECTIVE STATEMENT: No change in pain, lower back, B knees, R ankle . Feet were swollen but now ok   PERTINENT HISTORY: Rt shoulder replacement.  DDD Lumbar/cervical spine. scoliosis PAIN:  Are you having pain? Yes: NPRS scale: 4/10 Pain location: knee/legs Pain description: aching Aggravating factors: prolonged standing, walking; low back pain with standing activity Relieving factors: sitting, wearing knee braces  PRECAUTIONS: Fall  WEIGHT BEARING RESTRICTIONS: No  FALLS:  Has patient fallen in last 6 months? No  LIVING ENVIRONMENT: Lives with: lives with their family and lives alone Lives in: House/apartment Stairs: doesn't need to use steps in her home Has following equipment at home:  rollator , [facility]  OCCUPATION: retired   PLOF: Independent with basic ADLs  PATIENT GOALS: decrease pain in knees/back; improve steadiness    OBJECTIVE:   DIAGNOSTIC FINDINGS:   PATIENT SURVEYS:  FOTO: next visit  COGNITION: Overall cognitive status: Within functional limits for tasks assessed     SENSATION: Not tested denies NT  EDEMA:  None noted at eval, will sometimes have swelling in Lt ankle   MUSCLE LENGTH: Hamstrings: Right 90/90 45 deg ; Left 90/90 45 deg   POSTURE: decreased lumbar lordosis and weight shift left trunk lateral shift Lt   PALPATION: Tenderness Rt glute max  LOWER EXTREMITY ROM:  Passive ROM Right eval Left eval  Hip flexion    Hip extension    Hip abduction    Hip adduction    Hip internal rotation    Hip external rotation    Knee flexion WNL WNL  Knee extension WNL WNL  Ankle dorsiflexion    Ankle plantarflexion    Ankle inversion    Ankle eversion     (Blank rows = not tested)   LOWER EXTREMITY MMT:  MMT Right eval Left eval Right/left 06/26/22  Hip flexion 3+/pain 4 4-/4  Hip  extension 3 3   Hip abduction 3 3 4-/4-  Hip adduction     Hip internal rotation     Hip external rotation     Knee flexion 3+ 3+ 4-/4-  Knee extension 3+ 3+/pain 4-/4- pain  Ankle dorsiflexion 4 4   Ankle plantarflexion     Ankle inversion     Ankle eversion      (Blank rows = not tested) LUMBAR ROM:   AROM eval  Flexion WNL  Extension Unable to reach neutral  Right lateral flexion   Left lateral flexion   Right rotation 75% limited  Left rotation 75% limited   (Blank rows = not tested)  ] LOWER EXTREMITY SPECIAL TESTS:    FUNCTIONAL TESTS:  5 times sit to stand: 23 sec, weight shift Lt, UE assist  BERG balance test 06/26/22  = 38/56 GAIT: Distance walked: 20 Assistive device utilized: None Level of assistance: Complete Independence Comments: minimal hip extension Lt, Lt lateral trunk shift, increased weight through Lt LE   TODAY'S TREATMENT                                       08/05/22: NuStep L5x52mns Calf stretch 30s seated with strap x 2 each leg  Seated ball roll out x10 forwards and then each side 5xSTS re-test 22.44 sec  STS on airex x10 Marching on airex 20 reps Catch standing on airex Lat pull 10#, 15x Seated rows 5#, 15x   07/24/22 NuStep L5x672ms Calf stretch 30s  Calf raises x15 Toes raises x15  Seated ball roll out x10 forwards and then each side 5xSTS re-test STS on airex x10 Marching on airex 20 reps Catch standing on airex, then with feet together  07/22/22 Nustep L 5 80m480mBlack bar DF/PF 15 x each Airex marching,hip flex,ext and abd alt 20x in // bars-Foam beams in //bars Obstacles step over step and side stepping Step taps 4 inch Lat pull and seated row  07/17/22 NuStep L5 x6mi80mR ankle TB x10 Lat pulls downs 10# 2x10 Seated row 5# 2x10 Cone taps 20 reps alternating x2 Side steps over obstacles  Marching on airex   07/15/22 Nustep level 5 x 7 minutes Sit fit ankle motions Yellow tband ankle exercises 4 ways Lat pulls 10#  2x10 5# seated row 2x10 Supine feet on ball K2C, trunk rotation, small bridges and isometric abs PT gentle cervical distraction patient in supine Side step over sticks Cone toe touches Side step on and off airex On airex reaching, head turns and eyes closed  07/10/22 NuStep L4 x 6 minutes Standing, step forward with RLE, pick up a cone, then step back and ER RLE, reach back and place the cone on the mat. 5 x to each side, no instability noted. B side stepping on air ex plank  6 x each direction. Standing on air ex-static, lat weight shifts, for/back, all fairly easy for patient. Stand on air ex with narrow BOS, more challenging during weight shifts. Horizontal head turns, challenging, Vertical head turns easy. 4 square stepping over lines on floor, moving as quickly as possible in each direction. Able to increase speed without unsteadiness. Legs fatigued. Ambulation- forward with horizontal head turns, 2 x 25', then walking backwards, 2 x 25', mildly unsteady, but no LOB, CGA. Forward bend to pick up cone at 10", turn to R and reach up to place it on a table. 5 x each direction, no unsteadiness noted Alternately tapping 4" step with each foot, no UE support, increased speed, x 30 sec, no LOB.  06/26/22 Renewal and assess Merrilee Jansky 38/56 MMT Nustep level 4 x 6 minutes UBE Level 1 x 4 minutes Side stepping over object Volleyball On airex head turns and reaching Cone toe and hand touches, needed CGA Direction changes  06/05/22 Nustep L 4 6 min Lat pulls and Seated rows 10#, 2 x 10 reps Trunk ext against Black Tband, 2 x 10 reps 1.5# LE seated LAQ,hip flex and abd 2 sets 10 Standing on Airex 1.5# HHA marching, hip flex,ext,abd heel raises and mini squats 10 each UBE L 1 2 min fwd/2 min backward Resisted giat 4 way 3 x each 20#    06/03/22 NuStep L4 x 6 minutes Seated hamstring stretch 3 x 15 sec each leg Long kicks- noted click with L knee ext 10 reps each Seated march 10 reps  each Heel raise/toe raise 10 reps Clamshells against red tband 10 reps Flexor stretch over ball, 3 x 10 sec forward, and to each side Quick steps side to side, then forward and back, then changing direction upon therapist's command, BUE support. Mild unsteadiness.  PATIENT EDUCATION:  Education details: mechanics sit to stand,  Person educated: Patient and Child(ren) Education method: Explanation, Demonstration, and Handouts Education comprehension: verbalized understanding and returned demonstration  HOME EXERCISE PROGRAM: Access Code: [email or web address],  [id number] URL: [email or web address] Date: 04/09/2022 Prepared by: [facility]  ASSESSMENT:  CLINICAL IMPRESSION: Patient reassessed today.  Today was to be her original discharge date but she would like to extend current plan due to having to cancel some appointments as she has had to stay with her children in [place] on occasion.  Reports continued pain in R ankle and today has more pain in her back. reports pain consistently every day.  Using her rollator to walk outdoors on her driveway    OBJECTIVE IMPAIRMENTS: Abnormal gait, decreased activity tolerance, decreased balance, decreased knowledge of use of DME, decreased mobility, difficulty walking, decreased ROM, decreased strength, increased edema, impaired flexibility, improper body mechanics, postural dysfunction, and pain.   ACTIVITY LIMITATIONS: bending, standing, squatting, sleeping, stairs, and locomotion level  PARTICIPATION LIMITATIONS: cleaning, driving, community activity, and yard work  PERSONAL FACTORS: Age, Past/current experiences, Time since onset of injury/illness/exacerbation, and 3+ comorbidities: knee OA, lumbar DDD and scoliosis  are also affecting patient's functional outcome.   REHAB POTENTIAL: Good  CLINICAL DECISION MAKING: Evolving/moderate complexity  EVALUATION COMPLEXITY:  High   GOALS: Goals reviewed with patient? Yes  SHORT TERM GOALS: Target date: 05/10/22 Pt will be independent with her initial HEP to improve LE strength and pain. Baseline: Goal status: met 05/06/22  2.  Pt will be able to demonstrate proper body mechanics with bed mobility to decrease strain on her low back. Baseline:  Goal  status: met 05/06/22   LONG TERM GOALS: Target date: 09/02/22  Pt will be able to complete 5x sit to stand in less than 15 sec with or without UE support, to reflect improvements in LE mobility and strength. Baseline: 23sec, 12/14-16.7, 07/24/22-16.10  08/05/22: 22.44 sec  Goal status: ongoing  2.  Pt will report atleast 40% improvement in her knee and low back pain from the start of PT. Baseline: 12/14-Back pain is slightly better, knee pain remains. 08/05/22: not much change Goal status: ongoing  3.  Pt will be able to ambulate with LRAD outdoors x212f without assistance, to reflect improvement in her steadiness with ambulation. Baseline: 12/14 uses RW, walks outdoors x 20 minutes. Goal status: met  4.  Pt will be able to maintain SLS for up to 2 sec each LE, 2/3 trials. Baseline: 12/14-Stood on R x 3 sec on one attempt. All others < 2 sec on each leg. 08/05/22:  able to maintain over 10 sec each with one hand support, unsupported 1 -2 sec  Goal status: ongoing   PLAN:  PT FREQUENCY: 2x/week  PT DURATION: 4 weeks to 09/02/22  PLANNED INTERVENTIONS: Therapeutic exercises, Therapeutic activity, Neuromuscular re-education, Balance training, Gait training, Patient/Family education, Self Care, Joint mobilization, Aquatic Therapy, Electrical stimulation, Cryotherapy, Manual therapy, and Re-evaluation  PLAN FOR NEXT SESSION progress LE strength, lumbar/thoracic flexibility and balance activity . Needs a lot of work on her balance. Assess goals  Patient Details  Name: KYANESSA THREATSMRN: [id number] of Birth: 612/13/1930Referring Provider:  HWenda Low MD  Encounter Date: 08/05/2022  Angie Payseur PTA 08/05/22 5:02 PM [facility] [address] [place] [address]Phone: [phone]  Fax:  [phone] Patient Details  Name: KSAARA CABANILLAMRN: [id number] of Birth: [birth year]Referring Provider:  HWenda Low MD  Encounter Date: 08/05/2022   ATim Lair PT 08/05/2022, 5:02 PM  [place] [address]Phone: [phone]  Fax:  [phone]

## 2022-08-07 ENCOUNTER — Ambulatory Visit: Payer: Medicare Other

## 2022-08-12 ENCOUNTER — Ambulatory Visit: Payer: Medicare Other

## 2022-08-12 DIAGNOSIS — R2681 Unsteadiness on feet: Secondary | ICD-10-CM

## 2022-08-12 DIAGNOSIS — R262 Difficulty in walking, not elsewhere classified: Secondary | ICD-10-CM

## 2022-08-12 DIAGNOSIS — M6281 Muscle weakness (generalized): Secondary | ICD-10-CM

## 2022-08-12 DIAGNOSIS — R2689 Other abnormalities of gait and mobility: Secondary | ICD-10-CM

## 2022-08-12 DIAGNOSIS — G8929 Other chronic pain: Secondary | ICD-10-CM

## 2022-08-12 NOTE — Therapy (Signed)
OUTPATIENT PHYSICAL THERAPY LOWER EXTREMITY TREATMENT    Patient Name: Danielle Dean MRN: [id number] DOB:06/11/1929, 89 y.o., female Today's Date: 08/12/2022   PT End of Session - 08/12/22 1015     Visit Number 21    Number of Visits 24    Date for PT Re-Evaluation 09/02/22    Authorization Type MCR and MCD    Authorization Time Period 04/09/22 to 07/02/22    Progress Note Due on Visit 10    PT Start Time 1015    PT Stop Time 1100    PT Time Calculation (min) 45 min                 Past Medical History:  Diagnosis Date   Arthritis    Callus    Cataract fragments in both eyes following surgery    Corns and callosities    Hx of hysterectomy    Hypertension    Osteoarthritis    Past Surgical History:  Procedure Laterality Date   ABDOMINAL HYSTERECTOMY     NASAL SEPTUM SURGERY     ROTATOR CUFF REPAIR Right    TOTAL SHOULDER ARTHROPLASTY     Patient Active Problem List   Diagnosis Date Noted   DDD (degenerative disc disease), thoracic 11/06/2020   Other idiopathic scoliosis, thoracolumbar region 11/06/2020   DDD (degenerative disc disease), lumbar 11/06/2020   Age-related osteoporosis without current pathological fracture 11/06/2020   Porokeratosis 11/26/2018   Corns and callosities 11/26/2018   History of right shoulder replacement 10/07/2016   Chronic pain of both knees 05/20/2016   Osteoarthritis of both hands 05/20/2016   Essential hypertension 05/20/2016   Environmental allergies 05/20/2016   Primary osteoarthritis of both knees 05/19/2016   Callus of foot 11/24/2012   Pain in joint, ankle and foot 09/15/2012    PCP: Wenda Low, MD  REFERRING PROVIDER: Bo Merino, MD  REFERRING DIAG: Primary OA Bil knees, DDD lumbar, poor balance  THERAPY DIAG:  Unsteadiness on feet  Difficulty in walking, not elsewhere classified  Chronic pain of left knee  Chronic pain of right knee  Muscle weakness (generalized)  Other abnormalities of gait  and mobility  Rationale for Evaluation and Treatment Rehabilitation  ONSET DATE: chronic issues-several years   SUBJECTIVE:   SUBJECTIVE STATEMENT: My knees and R ankle still hurt.   PERTINENT HISTORY: Rt shoulder replacement.  DDD Lumbar/cervical spine. scoliosis PAIN:  Are you having pain? Yes: NPRS scale: 4/10 Pain location: knee/legs Pain description: aching Aggravating factors: prolonged standing, walking; low back pain with standing activity Relieving factors: sitting, wearing knee braces  PRECAUTIONS: Fall  WEIGHT BEARING RESTRICTIONS: No  FALLS:  Has patient fallen in last 6 months? No  LIVING ENVIRONMENT: Lives with: lives with their family and lives alone Lives in: House/apartment Stairs: doesn't need to use steps in her home Has following equipment at home:  rollator , [facility]  OCCUPATION: retired   PLOF: Independent with basic ADLs  PATIENT GOALS: decrease pain in knees/back; improve steadiness    OBJECTIVE:   DIAGNOSTIC FINDINGS:   PATIENT SURVEYS:  FOTO: next visit  COGNITION: Overall cognitive status: Within functional limits for tasks assessed     SENSATION: Not tested denies NT  EDEMA:  None noted at eval, will sometimes have swelling in Lt ankle   MUSCLE LENGTH: Hamstrings: Right 90/90 45 deg ; Left 90/90 45 deg   POSTURE: decreased lumbar lordosis and weight shift left trunk lateral shift Lt   PALPATION: Tenderness Rt glute max  LOWER EXTREMITY ROM:  Passive ROM Right eval Left eval  Hip flexion    Hip extension    Hip abduction    Hip adduction    Hip internal rotation    Hip external rotation    Knee flexion WNL WNL  Knee extension WNL WNL  Ankle dorsiflexion    Ankle plantarflexion    Ankle inversion    Ankle eversion     (Blank rows = not tested)   LOWER EXTREMITY MMT:  MMT Right eval Left eval Right/left 06/26/22  Hip flexion 3+/pain 4 4-/4  Hip extension 3 3   Hip abduction 3 3 4-/4-  Hip adduction      Hip internal rotation     Hip external rotation     Knee flexion 3+ 3+ 4-/4-  Knee extension 3+ 3+/pain 4-/4- pain  Ankle dorsiflexion 4 4   Ankle plantarflexion     Ankle inversion     Ankle eversion      (Blank rows = not tested) LUMBAR ROM:   AROM eval  Flexion WNL  Extension Unable to reach neutral  Right lateral flexion   Left lateral flexion   Right rotation 75% limited  Left rotation 75% limited   (Blank rows = not tested)  ] LOWER EXTREMITY SPECIAL TESTS:    FUNCTIONAL TESTS:  5 times sit to stand: 23 sec, weight shift Lt, UE assist  BERG balance test 06/26/22  = 38/56 GAIT: Distance walked: 20 Assistive device utilized: None Level of assistance: Complete Independence Comments: minimal hip extension Lt, Lt lateral trunk shift, increased weight through Lt LE   TODAY'S TREATMENT                                       08/12/22 Walking on beam [place] taps on airex in bars Calf raises on airex 2x10 in bars Hitting ball while standing on airex in bars  Standing on airex rows and ext red 2x10 NuStep L5 x53mns Lat pull downs 10# 2x10 STS push out with green ball x10    08/05/22: NuStep L5x654ms Calf stretch 30s seated with strap x 2 each leg  Seated ball roll out x10 forwards and then each side 5xSTS re-test 22.44 sec  STS on airex x10 Marching on airex 20 reps Catch standing on airex Lat pull 10#, 15x Seated rows 5#, 15x   07/24/22 NuStep L5x6m1m Calf stretch 30s  Calf raises x15 Toes raises x15  Seated ball roll out x10 forwards and then each side 5xSTS re-test STS on airex x10 Marching on airex 20 reps Catch standing on airex, then with feet together  07/22/22 Nustep L 5 7mi33mlack bar DF/PF 15 x each Airex marching,hip flex,ext and abd alt 20x in // bars-Foam beams in //bars Obstacles step over step and side stepping Step taps 4 inch Lat pull and seated row  07/17/22 NuStep L5 x6min88m ankle TB x10 Lat pulls downs 10# 2x10 Seated row 5#  2x10 Cone taps 20 reps alternating x2 Side steps over obstacles  Marching on airex   07/15/22 Nustep level 5 x 7 minutes Sit fit ankle motions Yellow tband ankle exercises 4 ways Lat pulls 10# 2x10 5# seated row 2x10 Supine feet on ball K2C, trunk rotation, small bridges and isometric abs PT gentle cervical distraction patient in supine Side step over sticks Cone toe touches Side step on and off airex On airex  reaching, head turns and eyes closed  07/10/22 NuStep L4 x 6 minutes Standing, step forward with RLE, pick up a cone, then step back and ER RLE, reach back and place the cone on the mat. 5 x to each side, no instability noted. B side stepping on air ex plank 6 x each direction. Standing on air ex-static, lat weight shifts, for/back, all fairly easy for patient. Stand on air ex with narrow BOS, more challenging during weight shifts. Horizontal head turns, challenging, Vertical head turns easy. 4 square stepping over lines on floor, moving as quickly as possible in each direction. Able to increase speed without unsteadiness. Legs fatigued. Ambulation- forward with horizontal head turns, 2 x 25', then walking backwards, 2 x 25', mildly unsteady, but no LOB, CGA. Forward bend to pick up cone at 10", turn to R and reach up to place it on a table. 5 x each direction, no unsteadiness noted Alternately tapping 4" step with each foot, no UE support, increased speed, x 30 sec, no LOB.  06/26/22 Renewal and assess Merrilee Jansky 38/56 MMT Nustep level 4 x 6 minutes UBE Level 1 x 4 minutes Side stepping over object Volleyball On airex head turns and reaching Cone toe and hand touches, needed CGA Direction changes  06/05/22 Nustep L 4 6 min Lat pulls and Seated rows 10#, 2 x 10 reps Trunk ext against Black Tband, 2 x 10 reps 1.5# LE seated LAQ,hip flex and abd 2 sets 10 Standing on Airex 1.5# HHA marching, hip flex,ext,abd heel raises and mini squats 10 each UBE L 1 2 min fwd/2 min  backward Resisted giat 4 way 3 x each 20#    06/03/22 NuStep L4 x 6 minutes Seated hamstring stretch 3 x 15 sec each leg Long kicks- noted click with L knee ext 10 reps each Seated march 10 reps each Heel raise/toe raise 10 reps Clamshells against red tband 10 reps Flexor stretch over ball, 3 x 10 sec forward, and to each side Quick steps side to side, then forward and back, then changing direction upon therapist's command, BUE support. Mild unsteadiness.  PATIENT EDUCATION:  Education details: mechanics sit to stand,  Person educated: Patient and Child(ren) Education method: Explanation, Demonstration, and Handouts Education comprehension: verbalized understanding and returned demonstration  HOME EXERCISE PROGRAM: Access Code: [email or web address],  [id number] URL: [email or web address] Date: 04/09/2022 Prepared by: [facility]  ASSESSMENT:  CLINICAL IMPRESSION:  Reports continued pain in R ankle and bilateral knees. Focused more on balance tasks on uneven surfaces. Fatigues at end of session with STS, unable to do another set.    OBJECTIVE IMPAIRMENTS: Abnormal gait, decreased activity tolerance, decreased balance, decreased knowledge of use of DME, decreased mobility, difficulty walking, decreased ROM, decreased strength, increased edema, impaired flexibility, improper body mechanics, postural dysfunction, and pain.   ACTIVITY LIMITATIONS: bending, standing, squatting, sleeping, stairs, and locomotion level  PARTICIPATION LIMITATIONS: cleaning, driving, community activity, and yard work  PERSONAL FACTORS: Age, Past/current experiences, Time since onset of injury/illness/exacerbation, and 3+ comorbidities: knee OA, lumbar DDD and scoliosis  are also affecting patient's functional outcome.   REHAB POTENTIAL: Good  CLINICAL DECISION MAKING: Evolving/moderate complexity  EVALUATION COMPLEXITY: High   GOALS: Goals reviewed  with patient? Yes  SHORT TERM GOALS: Target date: 05/10/22 Pt will be independent with her initial HEP to improve LE strength and pain. Baseline: Goal status: met 05/06/22  2.  Pt will be able to demonstrate proper body mechanics with bed  mobility to decrease strain on her low back. Baseline:  Goal status: met 05/06/22   LONG TERM GOALS: Target date: 09/02/22  Pt will be able to complete 5x sit to stand in less than 15 sec with or without UE support, to reflect improvements in LE mobility and strength. Baseline: 23sec, 12/14-16.7, 07/24/22-16.10  08/05/22: 22.44 sec  Goal status: ongoing  2.  Pt will report atleast 40% improvement in her knee and low back pain from the start of PT. Baseline: 12/14-Back pain is slightly better, knee pain remains. 08/05/22: not much change Goal status: ongoing  3.  Pt will be able to ambulate with LRAD outdoors x268f without assistance, to reflect improvement in her steadiness with ambulation. Baseline: 12/14 uses RW, walks outdoors x 20 minutes. Goal status: met  4.  Pt will be able to maintain SLS for up to 2 sec each LE, 2/3 trials. Baseline: 12/14-Stood on R x 3 sec on one attempt. All others < 2 sec on each leg. 08/05/22:  able to maintain over 10 sec each with one hand support, unsupported 1 -2 sec  Goal status: ongoing   PLAN:  PT FREQUENCY: 2x/week  PT DURATION: 4 weeks to 09/02/22  PLANNED INTERVENTIONS: Therapeutic exercises, Therapeutic activity, Neuromuscular re-education, Balance training, Gait training, Patient/Family education, Self Care, Joint mobilization, Aquatic Therapy, Electrical stimulation, Cryotherapy, Manual therapy, and Re-evaluation  PLAN FOR NEXT SESSION progress LE strength, lumbar/thoracic flexibility and balance activity . Needs a lot of work on her balance. Assess goals  Patient Details  Name: KEMILYMARIE DEILYMRN: [id number] of Birth: [birth year]Referring Provider:  HWenda Low MD  Encounter Date:  08/12/2022  Angie Payseur PTA 08/12/22 10:57 AM [place] [address]Phone: [phone]  Fax:  [phone] Patient Details  Name: KAMERA TORNOMRN: [id number] of Birth: 626-Nov-1930Referring Provider:  HWenda Low MD  Encounter Date: 08/12/2022   MAndris Baumann PT 08/12/2022, 10:57 AM  [place]at [place] [address]Phone: [phone]  Fax:  [phone]

## 2022-08-14 ENCOUNTER — Ambulatory Visit: Payer: Medicare Other

## 2022-08-14 DIAGNOSIS — M6281 Muscle weakness (generalized): Secondary | ICD-10-CM

## 2022-08-14 DIAGNOSIS — M25512 Pain in left shoulder: Secondary | ICD-10-CM

## 2022-08-14 DIAGNOSIS — M25612 Stiffness of left shoulder, not elsewhere classified: Secondary | ICD-10-CM

## 2022-08-14 DIAGNOSIS — R2689 Other abnormalities of gait and mobility: Secondary | ICD-10-CM

## 2022-08-14 DIAGNOSIS — M545 Low back pain, unspecified: Secondary | ICD-10-CM

## 2022-08-14 DIAGNOSIS — R2681 Unsteadiness on feet: Secondary | ICD-10-CM | POA: Diagnosis not present

## 2022-08-14 DIAGNOSIS — R293 Abnormal posture: Secondary | ICD-10-CM

## 2022-08-14 DIAGNOSIS — G8929 Other chronic pain: Secondary | ICD-10-CM

## 2022-08-14 DIAGNOSIS — R262 Difficulty in walking, not elsewhere classified: Secondary | ICD-10-CM

## 2022-08-14 NOTE — Therapy (Signed)
OUTPATIENT PHYSICAL THERAPY LOWER EXTREMITY TREATMENT    Patient Name: Danielle Dean MRN: [id number] DOB:07-Nov-1928, 89 y.o., female Today's Date: 08/14/2022   PT End of Session - 08/14/22 1057     Visit Number 22    Number of Visits 24    Date for PT Re-Evaluation 09/02/22    Authorization Type MCR and MCD    Authorization Time Period 04/09/22 to 07/02/22    Progress Note Due on Visit 10    PT Start Time 1100    PT Stop Time 1145    PT Time Calculation (min) 45 min    Activity Tolerance Patient tolerated treatment well                 Past Medical History:  Diagnosis Date   Arthritis    Callus    Cataract fragments in both eyes following surgery    Corns and callosities    Hx of hysterectomy    Hypertension    Osteoarthritis    Past Surgical History:  Procedure Laterality Date   ABDOMINAL HYSTERECTOMY     NASAL SEPTUM SURGERY     ROTATOR CUFF REPAIR Right    TOTAL SHOULDER ARTHROPLASTY     Patient Active Problem List   Diagnosis Date Noted   DDD (degenerative disc disease), thoracic 11/06/2020   Other idiopathic scoliosis, thoracolumbar region 11/06/2020   DDD (degenerative disc disease), lumbar 11/06/2020   Age-related osteoporosis without current pathological fracture 11/06/2020   Porokeratosis 11/26/2018   Corns and callosities 11/26/2018   History of right shoulder replacement 10/07/2016   Chronic pain of both knees 05/20/2016   Osteoarthritis of both hands 05/20/2016   Essential hypertension 05/20/2016   Environmental allergies 05/20/2016   Primary osteoarthritis of both knees 05/19/2016   Callus of foot 11/24/2012   Pain in joint, ankle and foot 09/15/2012    PCP: Wenda Low, MD  REFERRING PROVIDER: Bo Merino, MD  REFERRING DIAG: Primary OA Bil knees, DDD lumbar, poor balance  THERAPY DIAG:  Unsteadiness on feet  Difficulty in walking, not elsewhere classified  Chronic pain of left knee  Chronic pain of right  knee  Muscle weakness (generalized)  Other abnormalities of gait and mobility  Acute bilateral low back pain without sciatica  Abnormal posture  Left shoulder pain, unspecified chronicity  Stiffness of left shoulder, not elsewhere classified  Rationale for Evaluation and Treatment Rehabilitation  ONSET DATE: chronic issues-several years   SUBJECTIVE:   SUBJECTIVE STATEMENT: Everything is still the same, my knees are hurting today.   PERTINENT HISTORY: Rt shoulder replacement.  DDD Lumbar/cervical spine. scoliosis PAIN:  Are you having pain? Yes: NPRS scale: 4/10 Pain location: knee/legs Pain description: aching Aggravating factors: prolonged standing, walking; low back pain with standing activity Relieving factors: sitting, wearing knee braces  PRECAUTIONS: Fall  WEIGHT BEARING RESTRICTIONS: No  FALLS:  Has patient fallen in last 6 months? No  LIVING ENVIRONMENT: Lives with: lives with their family and lives alone Lives in: House/apartment Stairs: doesn't need to use steps in her home Has following equipment at home:  rollator , [facility]  OCCUPATION: retired   PLOF: Independent with basic ADLs  PATIENT GOALS: decrease pain in knees/back; improve steadiness    OBJECTIVE:   DIAGNOSTIC FINDINGS:   PATIENT SURVEYS:  FOTO: next visit  COGNITION: Overall cognitive status: Within functional limits for tasks assessed     SENSATION: Not tested denies NT  EDEMA:  None noted at eval, will sometimes have swelling in Lt  ankle   MUSCLE LENGTH: Hamstrings: Right 90/90 45 deg ; Left 90/90 45 deg   POSTURE: decreased lumbar lordosis and weight shift left trunk lateral shift Lt   PALPATION: Tenderness Rt glute max  LOWER EXTREMITY ROM:  Passive ROM Right eval Left eval  Hip flexion    Hip extension    Hip abduction    Hip adduction    Hip internal rotation    Hip external rotation    Knee flexion WNL WNL  Knee extension WNL WNL  Ankle dorsiflexion     Ankle plantarflexion    Ankle inversion    Ankle eversion     (Blank rows = not tested)   LOWER EXTREMITY MMT:  MMT Right eval Left eval Right/left 06/26/22  Hip flexion 3+/pain 4 4-/4  Hip extension 3 3   Hip abduction 3 3 4-/4-  Hip adduction     Hip internal rotation     Hip external rotation     Knee flexion 3+ 3+ 4-/4-  Knee extension 3+ 3+/pain 4-/4- pain  Ankle dorsiflexion 4 4   Ankle plantarflexion     Ankle inversion     Ankle eversion      (Blank rows = not tested) LUMBAR ROM:   AROM eval  Flexion WNL  Extension Unable to reach neutral  Right lateral flexion   Left lateral flexion   Right rotation 75% limited  Left rotation 75% limited   (Blank rows = not tested)  ] LOWER EXTREMITY SPECIAL TESTS:    FUNCTIONAL TESTS:  5 times sit to stand: 23 sec, weight shift Lt, UE assist  BERG balance test 06/26/22  = 38/56 GAIT: Distance walked: 20 Assistive device utilized: None Level of assistance: Complete Independence Comments: minimal hip extension Lt, Lt lateral trunk shift, increased weight through Lt LE   TODAY'S TREATMENT                                       08/14/22 NuStep L5 x57mns  Step ups 4" at stairs  R ankle TB red 2x10 R  STS on airex x10  Seated ball roll outs x10 Sea7ed rows 5# x10 Lat pull downs 10# 2x10  blackTB ext x10 Walking on beam    08/12/22 Walking on beam Cones taps on airex in bars Calf raises on airex 2x10 in bars Hitting ball while standing on airex in bars  Standing on airex rows and ext red 2x10 NuStep L5 x778ms Lat pull downs 10# 2x10 STS push out with green ball x10    08/05/22: NuStep L5x6m76m Calf stretch 30s seated with strap x 2 each leg  Seated ball roll out x10 forwards and then each side 5xSTS re-test 22.44 sec  STS on airex x10 Marching on airex 20 reps Catch standing on airex Lat pull 10#, 15x Seated rows 5#, 15x   07/24/22 NuStep L5x6mi42mCalf stretch 30s  Calf raises x15 Toes raises x15   Seated ball roll out x10 forwards and then each side 5xSTS re-test STS on airex x10 Marching on airex 20 reps Catch standing on airex, then with feet together  07/22/22 Nustep L 5 7min47mack bar DF/PF 15 x each Airex marching,hip flex,ext and abd alt 20x in // bars-Foam beams in //bars Obstacles step over step and side stepping Step taps 4 inch Lat pull and seated row  07/17/22 NuStep L5 x6mins104mankle TB x10  Lat pulls downs 10# 2x10 Seated row 5# 2x10 Cone taps 20 reps alternating x2 Side steps over obstacles  Marching on airex   07/15/22 Nustep level 5 x 7 minutes Sit fit ankle motions Yellow tband ankle exercises 4 ways Lat pulls 10# 2x10 5# seated row 2x10 Supine feet on ball K2C, trunk rotation, small bridges and isometric abs PT gentle cervical distraction patient in supine Side step over sticks Cone toe touches Side step on and off airex On airex reaching, head turns and eyes closed  07/10/22 NuStep L4 x 6 minutes Standing, step forward with RLE, pick up a cone, then step back and ER RLE, reach back and place the cone on the mat. 5 x to each side, no instability noted. B side stepping on air ex plank 6 x each direction. Standing on air ex-static, lat weight shifts, for/back, all fairly easy for patient. Stand on air ex with narrow BOS, more challenging during weight shifts. Horizontal head turns, challenging, Vertical head turns easy. 4 square stepping over lines on floor, moving as quickly as possible in each direction. Able to increase speed without unsteadiness. Legs fatigued. Ambulation- forward with horizontal head turns, 2 x 25', then walking backwards, 2 x 25', mildly unsteady, but no LOB, CGA. Forward bend to pick up cone at 10", turn to R and reach up to place it on a table. 5 x each direction, no unsteadiness noted Alternately tapping 4" step with each foot, no UE support, increased speed, x 30 sec, no LOB.  06/26/22 Renewal and assess Merrilee Jansky  38/56 MMT Nustep level 4 x 6 minutes UBE Level 1 x 4 minutes Side stepping over object Volleyball On airex head turns and reaching Cone toe and hand touches, needed CGA Direction changes  06/05/22 Nustep L 4 6 min Lat pulls and Seated rows 10#, 2 x 10 reps Trunk ext against Black Tband, 2 x 10 reps 1.5# LE seated LAQ,hip flex and abd 2 sets 10 Standing on Airex 1.5# HHA marching, hip flex,ext,abd heel raises and mini squats 10 each UBE L 1 2 min fwd/2 min backward Resisted giat 4 way 3 x each 20#    06/03/22 NuStep L4 x 6 minutes Seated hamstring stretch 3 x 15 sec each leg Long kicks- noted click with L knee ext 10 reps each Seated march 10 reps each Heel raise/toe raise 10 reps Clamshells against red tband 10 reps Flexor stretch over ball, 3 x 10 sec forward, and to each side Quick steps side to side, then forward and back, then changing direction upon therapist's command, BUE support. Mild unsteadiness.  PATIENT EDUCATION:  Education details: mechanics sit to stand,  Person educated: Patient and Child(ren) Education method: Explanation, Demonstration, and Handouts Education comprehension: verbalized understanding and returned demonstration  HOME EXERCISE PROGRAM: Access Code: [email or web address],  [email or web address] URL: [email or web address] Date: 04/09/2022 Prepared by: [facility]  ASSESSMENT:  CLINICAL IMPRESSION:  Reports continued pain in R ankle and bilateral knees. Continued with low level functional strengthening. Unable to do another set of STS as she states her knees are hurting.   OBJECTIVE IMPAIRMENTS: Abnormal gait, decreased activity tolerance, decreased balance, decreased knowledge of use of DME, decreased mobility, difficulty walking, decreased ROM, decreased strength, increased edema, impaired flexibility, improper body mechanics, postural dysfunction, and pain.   ACTIVITY LIMITATIONS: bending, standing,  squatting, sleeping, stairs, and locomotion level  PARTICIPATION LIMITATIONS: cleaning, driving, community activity, and yard work  PERSONAL FACTORS: Age, Past/current experiences, Time  since onset of injury/illness/exacerbation, and 3+ comorbidities: knee OA, lumbar DDD and scoliosis  are also affecting patient's functional outcome.   REHAB POTENTIAL: Good  CLINICAL DECISION MAKING: Evolving/moderate complexity  EVALUATION COMPLEXITY: High   GOALS: Goals reviewed with patient? Yes  SHORT TERM GOALS: Target date: 05/10/22 Pt will be independent with her initial HEP to improve LE strength and pain. Baseline: Goal status: met 05/06/22  2.  Pt will be able to demonstrate proper body mechanics with bed mobility to decrease strain on her low back. Baseline:  Goal status: met 05/06/22   LONG TERM GOALS: Target date: 09/02/22  Pt will be able to complete 5x sit to stand in less than 15 sec with or without UE support, to reflect improvements in LE mobility and strength. Baseline: 23sec, 12/14-16.7, 07/24/22-16.10  08/05/22: 22.44 sec  Goal status: ongoing  2.  Pt will report atleast 40% improvement in her knee and low back pain from the start of PT. Baseline: 12/14-Back pain is slightly better, knee pain remains. 08/05/22: not much change Goal status: ongoing  3.  Pt will be able to ambulate with LRAD outdoors x281f without assistance, to reflect improvement in her steadiness with ambulation. Baseline: 12/14 uses RW, walks outdoors x 20 minutes. Goal status: met  4.  Pt will be able to maintain SLS for up to 2 sec each LE, 2/3 trials. Baseline: 12/14-Stood on R x 3 sec on one attempt. All others < 2 sec on each leg. 08/05/22:  able to maintain over 10 sec each with one hand support, unsupported 1 -2 sec  Goal status: ongoing   PLAN:  PT FREQUENCY: 2x/week  PT DURATION: 4 weeks to 09/02/22  PLANNED INTERVENTIONS: Therapeutic exercises, Therapeutic activity, Neuromuscular  re-education, Balance training, Gait training, Patient/Family education, Self Care, Joint mobilization, Aquatic Therapy, Electrical stimulation, Cryotherapy, Manual therapy, and Re-evaluation  PLAN FOR NEXT SESSION progress LE strength, lumbar/thoracic flexibility and balance activity . Needs a lot of work on her balance. Assess goals  Patient Details  Name: KLYNNZIE OSHIELDSMRN: [id number] of Birth: [birth year]Referring Provider:  HWenda Low MD  Encounter Date: 08/14/2022  Angie Payseur PTA 08/14/22 11:50 AM [place] [address]Phone: [phone]  Fax:  [phone] Patient Details  Name: KSTAISHA VICARYMRN: [id number] of Birth: [birth year]Referring Provider:  HWenda Low MD  Encounter Date: 08/14/2022   MAndris Baumann PT 08/14/2022, 11:50 AM  [place] [address]Phone: [phone]  Fax:  [phone]

## 2022-08-26 ENCOUNTER — Ambulatory Visit: Payer: Medicare Other | Attending: Internal Medicine | Admitting: Physical Therapy

## 2022-08-26 ENCOUNTER — Encounter: Payer: Self-pay | Admitting: Physical Therapy

## 2022-08-26 DIAGNOSIS — R2681 Unsteadiness on feet: Secondary | ICD-10-CM | POA: Insufficient documentation

## 2022-08-26 DIAGNOSIS — G8929 Other chronic pain: Secondary | ICD-10-CM | POA: Diagnosis present

## 2022-08-26 DIAGNOSIS — R262 Difficulty in walking, not elsewhere classified: Secondary | ICD-10-CM | POA: Insufficient documentation

## 2022-08-26 DIAGNOSIS — M25562 Pain in left knee: Secondary | ICD-10-CM | POA: Diagnosis present

## 2022-08-26 DIAGNOSIS — M25561 Pain in right knee: Secondary | ICD-10-CM | POA: Insufficient documentation

## 2022-08-26 DIAGNOSIS — R2689 Other abnormalities of gait and mobility: Secondary | ICD-10-CM | POA: Diagnosis present

## 2022-08-26 DIAGNOSIS — M6281 Muscle weakness (generalized): Secondary | ICD-10-CM | POA: Insufficient documentation

## 2022-08-26 NOTE — Therapy (Signed)
OUTPATIENT PHYSICAL THERAPY LOWER EXTREMITY TREATMENT    Patient Name: Danielle Dean MRN: [id number] DOB:11/21/1928, 89 y.o., female Today's Date: 08/26/2022   PT End of Session - 08/26/22 1013     Visit Number 23    Date for PT Re-Evaluation 09/02/22    PT Start Time 1014    PT Stop Time 1100    PT Time Calculation (min) 46 min    Activity Tolerance Patient tolerated treatment well    Behavior During Therapy WFL for tasks assessed/performed                 Past Medical History:  Diagnosis Date   Arthritis    Callus    Cataract fragments in both eyes following surgery    Corns and callosities    Hx of hysterectomy    Hypertension    Osteoarthritis    Past Surgical History:  Procedure Laterality Date   ABDOMINAL HYSTERECTOMY     NASAL SEPTUM SURGERY     ROTATOR CUFF REPAIR Right    TOTAL SHOULDER ARTHROPLASTY     Patient Active Problem List   Diagnosis Date Noted   DDD (degenerative disc disease), thoracic 11/06/2020   Other idiopathic scoliosis, thoracolumbar region 11/06/2020   DDD (degenerative disc disease), lumbar 11/06/2020   Age-related osteoporosis without current pathological fracture 11/06/2020   Porokeratosis 11/26/2018   Corns and callosities 11/26/2018   History of right shoulder replacement 10/07/2016   Chronic pain of both knees 05/20/2016   Osteoarthritis of both hands 05/20/2016   Essential hypertension 05/20/2016   Environmental allergies 05/20/2016   Primary osteoarthritis of both knees 05/19/2016   Callus of foot 11/24/2012   Pain in joint, ankle and foot 09/15/2012    PCP: Wenda Low, MD  REFERRING PROVIDER: Bo Merino, MD  REFERRING DIAG: Primary OA Bil knees, DDD lumbar, poor balance  THERAPY DIAG:  Unsteadiness on feet  Difficulty in walking, not elsewhere classified  Chronic pain of left knee  Chronic pain of right knee  Muscle weakness (generalized)  Rationale for Evaluation and Treatment  Rehabilitation  ONSET DATE: chronic issues-several years   SUBJECTIVE:   SUBJECTIVE STATEMENT: "Managing everything is still hurts" PERTINENT HISTORY: Rt shoulder replacement.  DDD Lumbar/cervical spine. scoliosis PAIN:  Are you having pain? Yes: NPRS scale: 4/10 Pain location: knee/legs Pain description: aching Aggravating factors: prolonged standing, walking; low back pain with standing activity Relieving factors: sitting, wearing knee braces  PRECAUTIONS: Fall  WEIGHT BEARING RESTRICTIONS: No  FALLS:  Has patient fallen in last 6 months? No  LIVING ENVIRONMENT: Lives with: lives with their family and lives alone Lives in: House/apartment Stairs: doesn't need to use steps in her home Has following equipment at home:  rollator , [facility]  OCCUPATION: retired   PLOF: Independent with basic ADLs  PATIENT GOALS: decrease pain in knees/back; improve steadiness    OBJECTIVE:   DIAGNOSTIC FINDINGS:   PATIENT SURVEYS:  FOTO: next visit  COGNITION: Overall cognitive status: Within functional limits for tasks assessed     SENSATION: Not tested denies NT  EDEMA:  None noted at eval, will sometimes have swelling in Lt ankle   MUSCLE LENGTH: Hamstrings: Right 90/90 45 deg ; Left 90/90 45 deg   POSTURE: decreased lumbar lordosis and weight shift left trunk lateral shift Lt   PALPATION: Tenderness Rt glute max  LOWER EXTREMITY ROM:  Passive ROM Right eval Left eval  Hip flexion    Hip extension    Hip abduction  Hip adduction    Hip internal rotation    Hip external rotation    Knee flexion WNL WNL  Knee extension WNL WNL  Ankle dorsiflexion    Ankle plantarflexion    Ankle inversion    Ankle eversion     (Blank rows = not tested)   LOWER EXTREMITY MMT:  MMT Right eval Left eval Right/left 06/26/22  Hip flexion 3+/pain 4 4-/4  Hip extension 3 3   Hip abduction 3 3 4-/4-  Hip adduction     Hip internal rotation     Hip external rotation      Knee flexion 3+ 3+ 4-/4-  Knee extension 3+ 3+/pain 4-/4- pain  Ankle dorsiflexion 4 4   Ankle plantarflexion     Ankle inversion     Ankle eversion      (Blank rows = not tested) LUMBAR ROM:   AROM eval  Flexion WNL  Extension Unable to reach neutral  Right lateral flexion   Left lateral flexion   Right rotation 75% limited  Left rotation 75% limited   (Blank rows = not tested)  ] LOWER EXTREMITY SPECIAL TESTS:    FUNCTIONAL TESTS:  5 times sit to stand: 23 sec, weight shift Lt, UE assist  BERG balance test 06/26/22  = 38/56 GAIT: Distance walked: 20 Assistive device utilized: None Level of assistance: Complete Independence Comments: minimal hip extension Lt, Lt lateral trunk shift, increased weight through Lt LE   TODAY'S TREATMENT                                       08/26/22 NuStep L4 x 6 min Seated rows 5lb, Lats 10lb 2x10  R ankle TB red 2x10 R  STS on airex x10  HS curls red 2x10 Add ball squeeze 2x10 Side step and tandem walking on airex in // bars Heel raises on airex in // bars Steps 4 and 6 in over and back Bilateral shoulder flex and exr 1lb WaTE x10  08/14/22 NuStep L5 x87mns  Step ups 4" at stairs  R ankle TB red 2x10 R  STS on airex x10  Seated ball roll outs x10 Sea7ed rows 5# x10 Lat pull downs 10# 2x10  blackTB ext x10 Walking on beam    08/12/22 Walking on beam Cones taps on airex in bars Calf raises on airex 2x10 in bars Hitting ball while standing on airex in bars  Standing on airex rows and ext red 2x10 NuStep L5 x738ms Lat pull downs 10# 2x10 STS push out with green ball x10    08/05/22: NuStep L5x6m19m Calf stretch 30s seated with strap x 2 each leg  Seated ball roll out x10 forwards and then each side 5xSTS re-test 22.44 sec  STS on airex x10 Marching on airex 20 reps Catch standing on airex Lat pull 10#, 15x Seated rows 5#, 15x   07/24/22 NuStep L5x6mi23mCalf stretch 30s  Calf raises x15 Toes raises x15  Seated  ball roll out x10 forwards and then each side 5xSTS re-test STS on airex x10 Marching on airex 20 reps Catch standing on airex, then with feet together  07/22/22 Nustep L 5 7min8mack bar DF/PF 15 x each Airex marching,hip flex,ext and abd alt 20x in // bars-Foam beams in //bars Obstacles step over step and side stepping Step taps 4 inch Lat pull and seated row  07/17/22 NuStep L5 x6mins64m  R ankle TB x10 Lat pulls downs 10# 2x10 Seated row 5# 2x10 Cone taps 20 reps alternating x2 Side steps over obstacles  Marching on airex   07/15/22 Nustep level 5 x 7 minutes Sit fit ankle motions Yellow tband ankle exercises 4 ways Lat pulls 10# 2x10 5# seated row 2x10 Supine feet on ball K2C, trunk rotation, small bridges and isometric abs PT gentle cervical distraction patient in supine Side step over sticks Cone toe touches Side step on and off airex On airex reaching, head turns and eyes closed  07/10/22 NuStep L4 x 6 minutes Standing, step forward with RLE, pick up a cone, then step back and ER RLE, reach back and place the cone on the mat. 5 x to each side, no instability noted. B side stepping on air ex plank 6 x each direction. Standing on air ex-static, lat weight shifts, for/back, all fairly easy for patient. Stand on air ex with narrow BOS, more challenging during weight shifts. Horizontal head turns, challenging, Vertical head turns easy. 4 square stepping over lines on floor, moving as quickly as possible in each direction. Able to increase speed without unsteadiness. Legs fatigued. Ambulation- forward with horizontal head turns, 2 x 25', then walking backwards, 2 x 25', mildly unsteady, but no LOB, CGA. Forward bend to pick up cone at 10", turn to R and reach up to place it on a table. 5 x each direction, no unsteadiness noted Alternately tapping 4" step with each foot, no UE support, increased speed, x 30 sec, no LOB.  PATIENT EDUCATION:  Education details: mechanics sit to  stand,  Person educated: Patient and Child(ren) Education method: Explanation, Demonstration, and Handouts Education comprehension: verbalized understanding and returned demonstration  HOME EXERCISE PROGRAM: Access Code: [email or web address],  [id number] URL: [email or web address] Date: 04/09/2022 Prepared by: [facility]  ASSESSMENT:  CLINICAL IMPRESSION:  Reports continued pain in R ankle, shoulder, and bilateral knees. Continued with low level functional strengthening. Unable to do another set of STS as she states her knees are hurting. L knee pain reported with tandem walking and side steps. Postural cues given with standing shoulder flex and ext   OBJECTIVE IMPAIRMENTS: Abnormal gait, decreased activity tolerance, decreased balance, decreased knowledge of use of DME, decreased mobility, difficulty walking, decreased ROM, decreased strength, increased edema, impaired flexibility, improper body mechanics, postural dysfunction, and pain.   ACTIVITY LIMITATIONS: bending, standing, squatting, sleeping, stairs, and locomotion level  PARTICIPATION LIMITATIONS: cleaning, driving, community activity, and yard work  PERSONAL FACTORS: Age, Past/current experiences, Time since onset of injury/illness/exacerbation, and 3+ comorbidities: knee OA, lumbar DDD and scoliosis  are also affecting patient's functional outcome.   REHAB POTENTIAL: Good  CLINICAL DECISION MAKING: Evolving/moderate complexity  EVALUATION COMPLEXITY: High   GOALS: Goals reviewed with patient? Yes  SHORT TERM GOALS: Target date: 05/10/22 Pt will be independent with her initial HEP to improve LE strength and pain. Baseline: Goal status: met 05/06/22  2.  Pt will be able to demonstrate proper body mechanics with bed mobility to decrease strain on her low back. Baseline:  Goal status: met 05/06/22   LONG TERM GOALS: Target date: 09/02/22  Pt will be able to complete  5x sit to stand in less than 15 sec with or without UE support, to reflect improvements in LE mobility and strength. Baseline: 23sec, 12/14-16.7, 07/24/22-16.10  08/05/22: 22.44 sec  Goal status: ongoing  2.  Pt will report atleast 40% improvement in her knee and low  back pain from the start of PT. Baseline: 12/14-Back pain is slightly better, knee pain remains. 08/05/22: not much change Goal status: ongoing  3.  Pt will be able to ambulate with LRAD outdoors x263f without assistance, to reflect improvement in her steadiness with ambulation. Baseline: 12/14 uses RW, walks outdoors x 20 minutes. Goal status: met  4.  Pt will be able to maintain SLS for up to 2 sec each LE, 2/3 trials. Baseline: 12/14-Stood on R x 3 sec on one attempt. All others < 2 sec on each leg. 08/05/22:  able to maintain over 10 sec each with one hand support, unsupported 1 -2 sec  Goal status: ongoing   PLAN:  PT FREQUENCY: 2x/week  PT DURATION: 4 weeks to 09/02/22  PLANNED INTERVENTIONS: Therapeutic exercises, Therapeutic activity, Neuromuscular re-education, Balance training, Gait training, Patient/Family education, Self Care, Joint mobilization, Aquatic Therapy, Electrical stimulation, Cryotherapy, Manual therapy, and Re-evaluation  PLAN FOR NEXT SESSION progress LE strength, lumbar/thoracic flexibility and balance activity . Needs a lot of work on her balance. Assess goals   RCheri FowlerPTA 08/26/22 10:14 AM [place] [address]Phone: [phone]  Fax:  [phone]

## 2022-08-26 NOTE — Therapy (Signed)
OUTPATIENT PHYSICAL THERAPY LOWER EXTREMITY TREATMENT    Patient Name: Danielle Dean MRN: [id number] DOB:1929/05/15, 89 y.o., female Today's Date: 08/26/2022   PT End of Session - 08/26/22 1013     Visit Number 23    Date for PT Re-Evaluation 09/02/22    PT Start Time 1014    PT Stop Time 1100    PT Time Calculation (min) 46 min    Activity Tolerance Patient tolerated treatment well    Behavior During Therapy WFL for tasks assessed/performed                 Past Medical History:  Diagnosis Date   Arthritis    Callus    Cataract fragments in both eyes following surgery    Corns and callosities    Hx of hysterectomy    Hypertension    Osteoarthritis    Past Surgical History:  Procedure Laterality Date   ABDOMINAL HYSTERECTOMY     NASAL SEPTUM SURGERY     ROTATOR CUFF REPAIR Right    TOTAL SHOULDER ARTHROPLASTY     Patient Active Problem List   Diagnosis Date Noted   DDD (degenerative disc disease), thoracic 11/06/2020   Other idiopathic scoliosis, thoracolumbar region 11/06/2020   DDD (degenerative disc disease), lumbar 11/06/2020   Age-related osteoporosis without current pathological fracture 11/06/2020   Porokeratosis 11/26/2018   Corns and callosities 11/26/2018   History of right shoulder replacement 10/07/2016   Chronic pain of both knees 05/20/2016   Osteoarthritis of both hands 05/20/2016   Essential hypertension 05/20/2016   Environmental allergies 05/20/2016   Primary osteoarthritis of both knees 05/19/2016   Callus of foot 11/24/2012   Pain in joint, ankle and foot 09/15/2012    PCP: Wenda Low, MD  REFERRING PROVIDER: Bo Merino, MD  REFERRING DIAG: Primary OA Bil knees, DDD lumbar, poor balance  THERAPY DIAG:  Unsteadiness on feet  Difficulty in walking, not elsewhere classified  Chronic pain of left knee  Chronic pain of right knee  Muscle weakness (generalized)  Rationale for Evaluation and Treatment  Rehabilitation  ONSET DATE: chronic issues-several years   SUBJECTIVE:   SUBJECTIVE STATEMENT: What can I say, everything is the same.    PERTINENT HISTORY: Rt shoulder replacement.  DDD Lumbar/cervical spine. scoliosis PAIN:  Are you having pain? Yes: NPRS scale: 4/10 Pain location: knee/legs Pain description: aching Aggravating factors: prolonged standing, walking; low back pain with standing activity Relieving factors: sitting, wearing knee braces  PRECAUTIONS: Fall  WEIGHT BEARING RESTRICTIONS: No  FALLS:  Has patient fallen in last 6 months? No  LIVING ENVIRONMENT: Lives with: lives with their family and lives alone Lives in: House/apartment Stairs: doesn't need to use steps in her home Has following equipment at home:  rollator , [facility]  OCCUPATION: retired   PLOF: Independent with basic ADLs  PATIENT GOALS: decrease pain in knees/back; improve steadiness    OBJECTIVE:   DIAGNOSTIC FINDINGS:   PATIENT SURVEYS:  FOTO: next visit  COGNITION: Overall cognitive status: Within functional limits for tasks assessed     SENSATION: Not tested denies NT  EDEMA:  None noted at eval, will sometimes have swelling in Lt ankle   MUSCLE LENGTH: Hamstrings: Right 90/90 45 deg ; Left 90/90 45 deg   POSTURE: decreased lumbar lordosis and weight shift left trunk lateral shift Lt   PALPATION: Tenderness Rt glute max  LOWER EXTREMITY ROM:  Passive ROM Right eval Left eval  Hip flexion    Hip extension  Hip abduction    Hip adduction    Hip internal rotation    Hip external rotation    Knee flexion WNL WNL  Knee extension WNL WNL  Ankle dorsiflexion    Ankle plantarflexion    Ankle inversion    Ankle eversion     (Blank rows = not tested)   LOWER EXTREMITY MMT:  MMT Right eval Left eval Right/left 06/26/22  Hip flexion 3+/pain 4 4-/4  Hip extension 3 3   Hip abduction 3 3 4-/4-  Hip adduction     Hip internal rotation     Hip external  rotation     Knee flexion 3+ 3+ 4-/4-  Knee extension 3+ 3+/pain 4-/4- pain  Ankle dorsiflexion 4 4   Ankle plantarflexion     Ankle inversion     Ankle eversion      (Blank rows = not tested) LUMBAR ROM:   AROM eval  Flexion WNL  Extension Unable to reach neutral  Right lateral flexion   Left lateral flexion   Right rotation 75% limited  Left rotation 75% limited   (Blank rows = not tested)  ] LOWER EXTREMITY SPECIAL TESTS:    FUNCTIONAL TESTS:  5 times sit to stand: 23 sec, weight shift Lt, UE assist  BERG balance test 06/26/22  = 38/56 GAIT: Distance walked: 20 Assistive device utilized: None Level of assistance: Complete Independence Comments: minimal hip extension Lt, Lt lateral trunk shift, increased weight through Lt LE   TODAY'S TREATMENT                                       08/28/22 NuStep L4 x16mns  Standing shoulder flexion 2# 2x10 Seated abd red 2x10 HS curls red 2x10 LAQ 1# 2x10 Marching 1# 20 reps Lat pull downs 10# 2x10   Walking on beam  Up and down steps 1x    08/26/22 NuStep L4 x 6 min Seated rows 5lb, Lats 10lb 2x10  R ankle TB red 2x10 R  STS on airex x10  HS curls red 2x10 Add ball squeeze 2x10 Side step and tandem walking on airex in // bars Heel raises on airex in // bars Steps 4 and 6 in over and back Bilateral shoulder flex and exr 1lb WaTE x10  08/14/22 NuStep L5 x611ms  Step ups 4" at stairs  R ankle TB red 2x10 R  STS on airex x10  Seated ball roll outs x10 Sea7ed rows 5# x10 Lat pull downs 10# 2x10  blackTB ext x10 Walking on beam    08/12/22 Walking on beam Cones taps on airex in bars Calf raises on airex 2x10 in bars Hitting ball while standing on airex in bars  Standing on airex rows and ext red 2x10 NuStep L5 x7m22m Lat pull downs 10# 2x10 STS push out with green ball x10    08/05/22: NuStep L5x6mi34mCalf stretch 30s seated with strap x 2 each leg  Seated ball roll out x10 forwards and then each  side 5xSTS re-test 22.44 sec  STS on airex x10 Marching on airex 20 reps Catch standing on airex Lat pull 10#, 15x Seated rows 5#, 15x   07/24/22 NuStep L5x6min34malf stretch 30s  Calf raises x15 Toes raises x15  Seated ball roll out x10 forwards and then each side 5xSTS re-test STS on airex x10 Marching on airex 20 reps Catch standing on airex, then  with feet together  07/22/22 Nustep L 5 51mn Black bar DF/PF 15 x each Airex marching,hip flex,ext and abd alt 20x in // bars-Foam beams in //bars Obstacles step over step and side stepping Step taps 4 inch Lat pull and seated row  07/17/22 NuStep L5 x629ms R ankle TB x10 Lat pulls downs 10# 2x10 Seated row 5# 2x10 Cone taps 20 reps alternating x2 Side steps over obstacles  Marching on airex   07/15/22 Nustep level 5 x 7 minutes Sit fit ankle motions Yellow tband ankle exercises 4 ways Lat pulls 10# 2x10 5# seated row 2x10 Supine feet on ball K2C, trunk rotation, small bridges and isometric abs PT gentle cervical distraction patient in supine Side step over sticks Cone toe touches Side step on and off airex On airex reaching, head turns and eyes closed  07/10/22 NuStep L4 x 6 minutes Standing, step forward with RLE, pick up a cone, then step back and ER RLE, reach back and place the cone on the mat. 5 x to each side, no instability noted. B side stepping on air ex plank 6 x each direction. Standing on air ex-static, lat weight shifts, for/back, all fairly easy for patient. Stand on air ex with narrow BOS, more challenging during weight shifts. Horizontal head turns, challenging, Vertical head turns easy. 4 square stepping over lines on floor, moving as quickly as possible in each direction. Able to increase speed without unsteadiness. Legs fatigued. Ambulation- forward with horizontal head turns, 2 x 25', then walking backwards, 2 x 25', mildly unsteady, but no LOB, CGA. Forward bend to pick up cone at 10", turn to R and  reach up to place it on a table. 5 x each direction, no unsteadiness noted Alternately tapping 4" step with each foot, no UE support, increased speed, x 30 sec, no LOB.  PATIENT EDUCATION:  Education details: mechanics sit to stand,  Person educated: Patient and Child(ren) Education method: Explanation, Demonstration, and Handouts Education comprehension: verbalized understanding and returned demonstration  HOME EXERCISE PROGRAM: Access Code: [email or web address]: [email or web address] Date: 04/09/2022 Prepared by: [facility]CNorth [facility] ClinicASSESSMENT:  CLINICAL IMPRESSION:  Reports continued pain in R ankle, shoulder, and bilateral knees. Continued with low level functional strengthening. Added in some 1# weights today. L knee pain reported with tandem walking on beam.    OBJECTIVE IMPAIRMENTS: Abnormal gait, decreased activity tolerance, decreased balance, decreased knowledge of use of DME, decreased mobility, difficulty walking, decreased ROM, decreased strength, increased edema, impaired flexibility, improper body mechanics, postural dysfunction, and pain.   ACTIVITY LIMITATIONS: bending, standing, squatting, sleeping, stairs, and locomotion level  PARTICIPATION LIMITATIONS: cleaning, driving, community activity, and yard work  PERSONAL FACTORS: Age, Past/current experiences, Time since onset of injury/illness/exacerbation, and 3+ comorbidities: knee OA, lumbar DDD and scoliosis  are also affecting patient's functional outcome.   REHAB POTENTIAL: Good  CLINICAL DECISION MAKING: Evolving/moderate complexity  EVALUATION COMPLEXITY: High   GOALS: Goals reviewed with patient? Yes  SHORT TERM GOALS: Target date: 05/10/22 Pt will be independent with her initial HEP to improve LE strength and pain. Baseline: Goal status: met 05/06/22  2.  Pt will be able to demonstrate proper body mechanics with bed mobility to decrease strain  on her low back. Baseline:  Goal status: met 05/06/22   LONG TERM GOALS: Target date: 09/02/22  Pt will be able to complete 5x sit to stand in less than 15 sec with or without UE support, to reflect  improvements in LE mobility and strength. Baseline: 23sec, 12/14-16.7, 07/24/22-16.10  08/05/22: 22.44 sec  Goal status: ongoing  2.  Pt will report atleast 40% improvement in her knee and low back pain from the start of PT. Baseline: 12/14-Back pain is slightly better, knee pain remains. 08/05/22: not much change Goal status: ongoing  3.  Pt will be able to ambulate with LRAD outdoors x226f without assistance, to reflect improvement in her steadiness with ambulation. Baseline: 12/14 uses RW, walks outdoors x 20 minutes. Goal status: met  4.  Pt will be able to maintain SLS for up to 2 sec each LE, 2/3 trials. Baseline: 12/14-Stood on R x 3 sec on one attempt. All others < 2 sec on each leg. 08/05/22:  able to maintain over 10 sec each with one hand support, unsupported 1 -2 sec  Goal status: ongoing   PLAN:  PT FREQUENCY: 2x/week  PT DURATION: 4 weeks to 09/02/22  PLANNED INTERVENTIONS: Therapeutic exercises, Therapeutic activity, Neuromuscular re-education, Balance training, Gait training, Patient/Family education, Self Care, Joint mobilization, Aquatic Therapy, Electrical stimulation, Cryotherapy, Manual therapy, and Re-evaluation  PLAN FOR NEXT SESSION progress LE strength, lumbar/thoracic flexibility and balance activity . Needs a lot of work on her balance. Assess goals   RCheri FowlerPTA 08/26/22 10:14 AM [facility] [place] [address]Phone: [phone]  Fax:  [phone]

## 2022-08-28 ENCOUNTER — Ambulatory Visit: Payer: Medicare Other

## 2022-08-28 DIAGNOSIS — M6281 Muscle weakness (generalized): Secondary | ICD-10-CM

## 2022-08-28 DIAGNOSIS — R262 Difficulty in walking, not elsewhere classified: Secondary | ICD-10-CM

## 2022-08-28 DIAGNOSIS — R2681 Unsteadiness on feet: Secondary | ICD-10-CM | POA: Diagnosis not present

## 2022-08-28 DIAGNOSIS — R2689 Other abnormalities of gait and mobility: Secondary | ICD-10-CM

## 2022-08-28 DIAGNOSIS — G8929 Other chronic pain: Secondary | ICD-10-CM

## 2022-09-02 ENCOUNTER — Ambulatory Visit: Payer: Medicare Other

## 2022-09-04 ENCOUNTER — Ambulatory Visit: Payer: Medicare Other

## 2022-09-04 DIAGNOSIS — R2681 Unsteadiness on feet: Secondary | ICD-10-CM

## 2022-09-04 DIAGNOSIS — G8929 Other chronic pain: Secondary | ICD-10-CM

## 2022-09-04 DIAGNOSIS — M6281 Muscle weakness (generalized): Secondary | ICD-10-CM

## 2022-09-04 DIAGNOSIS — R2689 Other abnormalities of gait and mobility: Secondary | ICD-10-CM

## 2022-09-04 DIAGNOSIS — R262 Difficulty in walking, not elsewhere classified: Secondary | ICD-10-CM

## 2022-09-04 NOTE — Therapy (Signed)
OUTPATIENT PHYSICAL THERAPY LOWER EXTREMITY TREATMENT    Patient Name: Danielle Dean MRN: [id number] DOB:1928-07-04, 89 y.o., female Today's Date: 09/04/2022   PT End of Session - 09/04/22 1103     Visit Number 25    Date for PT Re-Evaluation 09/11/22    PT Start Time 1100    PT Stop Time 1145    PT Time Calculation (min) 45 min    Activity Tolerance Patient tolerated treatment well    Behavior During Therapy WFL for tasks assessed/performed                 Past Medical History:  Diagnosis Date   Arthritis    Callus    Cataract fragments in both eyes following surgery    Corns and callosities    Hx of hysterectomy    Hypertension    Osteoarthritis    Past Surgical History:  Procedure Laterality Date   ABDOMINAL HYSTERECTOMY     NASAL SEPTUM SURGERY     ROTATOR CUFF REPAIR Right    TOTAL SHOULDER ARTHROPLASTY     Patient Active Problem List   Diagnosis Date Noted   DDD (degenerative disc disease), thoracic 11/06/2020   Other idiopathic scoliosis, thoracolumbar region 11/06/2020   DDD (degenerative disc disease), lumbar 11/06/2020   Age-related osteoporosis without current pathological fracture 11/06/2020   Porokeratosis 11/26/2018   Corns and callosities 11/26/2018   History of right shoulder replacement 10/07/2016   Chronic pain of both knees 05/20/2016   Osteoarthritis of both hands 05/20/2016   Essential hypertension 05/20/2016   Environmental allergies 05/20/2016   Primary osteoarthritis of both knees 05/19/2016   Callus of foot 11/24/2012   Pain in joint, ankle and foot 09/15/2012    PCP: Wenda Low, MD  REFERRING PROVIDER: Bo Merino, MD  REFERRING DIAG: Primary OA Bil knees, DDD lumbar, poor balance  THERAPY DIAG:  Unsteadiness on feet  Difficulty in walking, not elsewhere classified  Chronic pain of left knee  Chronic pain of right knee  Muscle weakness (generalized)  Other abnormalities of gait and mobility  Rationale  for Evaluation and Treatment Rehabilitation  ONSET DATE: chronic issues-several years   SUBJECTIVE:   SUBJECTIVE STATEMENT: Not so good, everything is the same I am stiff in the mornings.    PERTINENT HISTORY: Rt shoulder replacement.  DDD Lumbar/cervical spine. scoliosis PAIN:  Are you having pain? Yes: NPRS scale: 4/10 Pain location: knee/legs Pain description: aching Aggravating factors: prolonged standing, walking; low back pain with standing activity Relieving factors: sitting, wearing knee braces  PRECAUTIONS: Fall  WEIGHT BEARING RESTRICTIONS: No  FALLS:  Has patient fallen in last 6 months? No  LIVING ENVIRONMENT: Lives with: lives with their family and lives alone Lives in: House/apartment Stairs: doesn't need to use steps in her home Has following equipment at home:  rollator , [facility]  OCCUPATION: retired   PLOF: Independent with basic ADLs  PATIENT GOALS: decrease pain in knees/back; improve steadiness    OBJECTIVE:   DIAGNOSTIC FINDINGS:   PATIENT SURVEYS:  FOTO: next visit  COGNITION: Overall cognitive status: Within functional limits for tasks assessed     SENSATION: Not tested denies NT  EDEMA:  None noted at eval, will sometimes have swelling in Lt ankle   MUSCLE LENGTH: Hamstrings: Right 90/90 45 deg ; Left 90/90 45 deg   POSTURE: decreased lumbar lordosis and weight shift left trunk lateral shift Lt   PALPATION: Tenderness Rt glute max  LOWER EXTREMITY ROM:  Passive ROM Right  eval Left eval  Hip flexion    Hip extension    Hip abduction    Hip adduction    Hip internal rotation    Hip external rotation    Knee flexion WNL WNL  Knee extension WNL WNL  Ankle dorsiflexion    Ankle plantarflexion    Ankle inversion    Ankle eversion     (Blank rows = not tested)   LOWER EXTREMITY MMT:  MMT Right eval Left eval Right/left 06/26/22  Hip flexion 3+/pain 4 4-/4  Hip extension 3 3   Hip abduction 3 3 4-/4-  Hip  adduction     Hip internal rotation     Hip external rotation     Knee flexion 3+ 3+ 4-/4-  Knee extension 3+ 3+/pain 4-/4- pain  Ankle dorsiflexion 4 4   Ankle plantarflexion     Ankle inversion     Ankle eversion      (Blank rows = not tested) LUMBAR ROM:   AROM eval  Flexion WNL  Extension Unable to reach neutral  Right lateral flexion   Left lateral flexion   Right rotation 75% limited  Left rotation 75% limited   (Blank rows = not tested)  ] LOWER EXTREMITY SPECIAL TESTS:    FUNCTIONAL TESTS:  5 times sit to stand: 23 sec, weight shift Lt, UE assist  BERG balance test 06/26/22  = 38/56 GAIT: Distance walked: 20 Assistive device utilized: None Level of assistance: Complete Independence Comments: minimal hip extension Lt, Lt lateral trunk shift, increased weight through Lt LE   TODAY'S TREATMENT                                       09/04/22 NuStep L5 x26mins  Standing rows and ext redTB 2x10 STS on airex 2x5 Heel taps 4" standing on airex- minA   Ball squeezes 2x10 Horizontal abd yellow 2x10 Pball roll outs x10 Walking on beam  08/28/22 NuStep L4 x32mins  Standing shoulder flexion 2# 2x10 Seated abd red 2x10 HS curls red 2x10 LAQ 1# 2x10 Marching 1# 20 reps Lat pull downs 10# 2x10   Walking on beam  Up and down steps 1x    08/26/22 NuStep L4 x 6 min Seated rows 5lb, Lats 10lb 2x10  R ankle TB red 2x10 R  STS on airex x10  HS curls red 2x10 Add ball squeeze 2x10 Side step and tandem walking on airex in // bars Heel raises on airex in // bars Steps 4 and 6 in over and back Bilateral shoulder flex and exr 1lb WaTE x10  08/14/22 NuStep L5 x64mins  Step ups 4" at stairs  R ankle TB red 2x10 R  STS on airex x10  Seated ball roll outs x10 Sea7ed rows 5# x10 Lat pull downs 10# 2x10  blackTB ext x10 Walking on beam    08/12/22 Walking on beam Cones taps on airex in bars Calf raises on airex 2x10 in bars Hitting ball while standing on airex in  bars  Standing on airex rows and ext red 2x10 NuStep L5 x61mins Lat pull downs 10# 2x10 STS push out with green ball x10    08/05/22: NuStep L5x59mins Calf stretch 30s seated with strap x 2 each leg  Seated ball roll out x10 forwards and then each side 5xSTS re-test 22.44 sec  STS on airex x10 Marching on airex 20 reps Catch  standing on airex Lat pull 10#, 15x Seated rows 5#, 15x   07/24/22 NuStep L5x68mins Calf stretch 30s  Calf raises x15 Toes raises x15  Seated ball roll out x10 forwards and then each side 5xSTS re-test STS on airex x10 Marching on airex 20 reps Catch standing on airex, then with feet together  07/22/22 Nustep L 5 53min Black bar DF/PF 15 x each Airex marching,hip flex,ext and abd alt 20x in // bars-Foam beams in //bars Obstacles step over step and side stepping Step taps 4 inch Lat pull and seated row  07/17/22 NuStep L5 x65mins R ankle TB x10 Lat pulls downs 10# 2x10 Seated row 5# 2x10 Cone taps 20 reps alternating x2 Side steps over obstacles  Marching on airex   07/15/22 Nustep level 5 x 7 minutes Sit fit ankle motions Yellow tband ankle exercises 4 ways Lat pulls 10# 2x10 5# seated row 2x10 Supine feet on ball K2C, trunk rotation, small bridges and isometric abs PT gentle cervical distraction patient in supine Side step over sticks Cone toe touches Side step on and off airex On airex reaching, head turns and eyes closed  07/10/22 NuStep L4 x 6 minutes Standing, step forward with RLE, pick up a cone, then step back and ER RLE, reach back and place the cone on the mat. 5 x to each side, no instability noted. B side stepping on air ex plank 6 x each direction. Standing on air ex-static, lat weight shifts, for/back, all fairly easy for patient. Stand on air ex with narrow BOS, more challenging during weight shifts. Horizontal head turns, challenging, Vertical head turns easy. 4 square stepping over lines on floor, moving as quickly as possible  in each direction. Able to increase speed without unsteadiness. Legs fatigued. Ambulation- forward with horizontal head turns, 2 x 25', then walking backwards, 2 x 25', mildly unsteady, but no LOB, CGA. Forward bend to pick up cone at 10", turn to R and reach up to place it on a table. 5 x each direction, no unsteadiness noted Alternately tapping 4" step with each foot, no UE support, increased speed, x 30 sec, no LOB.  PATIENT EDUCATION:  Education details: mechanics sit to stand,  Person educated: Patient and Child(ren) Education method: Explanation, Demonstration, and Handouts Education comprehension: verbalized understanding and returned demonstration  HOME EXERCISE PROGRAM: Access Code: [email or web address],  [email or web address] URL: [email or web address] Date: 04/09/2022 Prepared by: [facility]  ASSESSMENT:  CLINICAL IMPRESSION:  Reports continued pain in R ankle, shoulder, and bilateral knees. Continued with low level functional strengthening.    OBJECTIVE IMPAIRMENTS: Abnormal gait, decreased activity tolerance, decreased balance, decreased knowledge of use of DME, decreased mobility, difficulty walking, decreased ROM, decreased strength, increased edema, impaired flexibility, improper body mechanics, postural dysfunction, and pain.   ACTIVITY LIMITATIONS: bending, standing, squatting, sleeping, stairs, and locomotion level  PARTICIPATION LIMITATIONS: cleaning, driving, community activity, and yard work  PERSONAL FACTORS: Age, Past/current experiences, Time since onset of injury/illness/exacerbation, and 3+ comorbidities: knee OA, lumbar DDD and scoliosis  are also affecting patient's functional outcome.   REHAB POTENTIAL: Good  CLINICAL DECISION MAKING: Evolving/moderate complexity  EVALUATION COMPLEXITY: High   GOALS: Goals reviewed with patient? Yes  SHORT TERM GOALS: Target date: 05/10/22 Pt will be independent with her  initial HEP to improve LE strength and pain. Baseline: Goal status: met 05/06/22  2.  Pt will be able to demonstrate proper body mechanics with bed mobility to decrease strain on her low  back. Baseline:  Goal status: met 05/06/22   LONG TERM GOALS: Target date: 09/02/22  Pt will be able to complete 5x sit to stand in less than 15 sec with or without UE support, to reflect improvements in LE mobility and strength. Baseline: 23sec, 12/14-16.7, 07/24/22-16.10  08/05/22: 22.44 sec  Goal status: ongoing  2.  Pt will report atleast 40% improvement in her knee and low back pain from the start of PT. Baseline: 12/14-Back pain is slightly better, knee pain remains. 08/05/22: not much change Goal status: ongoing  3.  Pt will be able to ambulate with LRAD outdoors x261ft without assistance, to reflect improvement in her steadiness with ambulation. Baseline: 12/14 uses RW, walks outdoors x 20 minutes. Goal status: met  4.  Pt will be able to maintain SLS for up to 2 sec each LE, 2/3 trials. Baseline: 12/14-Stood on R x 3 sec on one attempt. All others < 2 sec on each leg. 08/05/22:  able to maintain over 10 sec each with one hand support, unsupported 1 -2 sec  Goal status: ongoing   PLAN:  PT FREQUENCY: 2x/week  PT DURATION: 4 weeks to 09/02/22  PLANNED INTERVENTIONS: Therapeutic exercises, Therapeutic activity, Neuromuscular re-education, Balance training, Gait training, Patient/Family education, Self Care, Joint mobilization, Aquatic Therapy, Electrical stimulation, Cryotherapy, Manual therapy, and Re-evaluation  PLAN FOR NEXT SESSION progress LE strength, lumbar/thoracic flexibility and balance activity . Needs a lot of work on her balance. Assess goals   Cheri Fowler PTA 09/04/22 11:43 AM [facility] [place]. [place], [place], [address] Phone: [phone]   Fax:  [phone]

## 2022-09-08 NOTE — Therapy (Signed)
OUTPATIENT PHYSICAL THERAPY LOWER EXTREMITY TREATMENT    Patient Name: Danielle Dean MRN: [id number] DOB:[birth year], 89 y.o., female Today's Date: 09/09/2022   PT End of Session - 09/09/22 1056     Visit Number 26    Date for PT Re-Evaluation 09/11/22    PT Start Time 1056    PT Stop Time 1140    PT Time Calculation (min) 44 min    Activity Tolerance Patient tolerated treatment well    Behavior During Therapy WFL for tasks assessed/performed                  Past Medical History:  Diagnosis Date   Arthritis    Callus    Cataract fragments in both eyes following surgery    Corns and callosities    Hx of hysterectomy    Hypertension    Osteoarthritis    Past Surgical History:  Procedure Laterality Date   ABDOMINAL HYSTERECTOMY     NASAL SEPTUM SURGERY     ROTATOR CUFF REPAIR Right    TOTAL SHOULDER ARTHROPLASTY     Patient Active Problem List   Diagnosis Date Noted   DDD (degenerative disc disease), thoracic 11/06/2020   Other idiopathic scoliosis, thoracolumbar region 11/06/2020   DDD (degenerative disc disease), lumbar 11/06/2020   Age-related osteoporosis without current pathological fracture 11/06/2020   Porokeratosis 11/26/2018   Corns and callosities 11/26/2018   History of right shoulder replacement 10/07/2016   Chronic pain of both knees 05/20/2016   Osteoarthritis of both hands 05/20/2016   Essential hypertension 05/20/2016   Environmental allergies 05/20/2016   Primary osteoarthritis of both knees 05/19/2016   Callus of foot 11/24/2012   Pain in joint, ankle and foot 09/15/2012    PCP: Wenda Low, MD  REFERRING PROVIDER: Bo Merino, MD  REFERRING DIAG: Primary OA Bil knees, DDD lumbar, poor balance  THERAPY DIAG:  Unsteadiness on feet  Difficulty in walking, not elsewhere classified  Chronic pain of left knee  Chronic pain of right knee  Muscle weakness (generalized)  Other abnormalities of gait and  mobility  Rationale for Evaluation and Treatment Rehabilitation  ONSET DATE: chronic issues-several years   SUBJECTIVE:   SUBJECTIVE STATEMENT: Not so good, everything is the same   PERTINENT HISTORY: Rt shoulder replacement.  DDD Lumbar/cervical spine. scoliosis PAIN:  Are you having pain? Yes: NPRS scale: 4/10 Pain location: knee/legs Pain description: aching Aggravating factors: prolonged standing, walking; low back pain with standing activity Relieving factors: sitting, wearing knee braces  PRECAUTIONS: Fall  WEIGHT BEARING RESTRICTIONS: No  FALLS:  Has patient fallen in last 6 months? No  LIVING ENVIRONMENT: Lives with: lives with their family and lives alone Lives in: House/apartment Stairs: doesn't need to use steps in her home Has following equipment at home:  rollator , [employer]  OCCUPATION: retired   PLOF: Independent with basic ADLs  PATIENT GOALS: decrease pain in knees/back; improve steadiness    OBJECTIVE:   DIAGNOSTIC FINDINGS:   PATIENT SURVEYS:  FOTO: next visit  COGNITION: Overall cognitive status: Within functional limits for tasks assessed     SENSATION: Not tested denies NT  EDEMA:  None noted at eval, will sometimes have swelling in Lt ankle   MUSCLE LENGTH: Hamstrings: Right 90/90 45 deg ; Left 90/90 45 deg   POSTURE: decreased lumbar lordosis and weight shift left trunk lateral shift Lt   PALPATION: Tenderness Rt glute max  LOWER EXTREMITY ROM:  Passive ROM Right eval Left eval  Hip flexion  Hip extension    Hip abduction    Hip adduction    Hip internal rotation    Hip external rotation    Knee flexion WNL WNL  Knee extension WNL WNL  Ankle dorsiflexion    Ankle plantarflexion    Ankle inversion    Ankle eversion     (Blank rows = not tested)   LOWER EXTREMITY MMT:  MMT Right eval Left eval Right/left 06/26/22  Hip flexion 3+/pain 4 4-/4  Hip extension 3 3   Hip abduction 3 3 4-/4-  Hip adduction      Hip internal rotation     Hip external rotation     Knee flexion 3+ 3+ 4-/4-  Knee extension 3+ 3+/pain 4-/4- pain  Ankle dorsiflexion 4 4   Ankle plantarflexion     Ankle inversion     Ankle eversion      (Blank rows = not tested) LUMBAR ROM:   AROM eval  Flexion WNL  Extension Unable to reach neutral  Right lateral flexion   Left lateral flexion   Right rotation 75% limited  Left rotation 75% limited   (Blank rows = not tested)  ] LOWER EXTREMITY SPECIAL TESTS:    FUNCTIONAL TESTS:  5 times sit to stand: 23 sec, weight shift Lt, UE assist  BERG balance test 06/26/22  = 38/56 GAIT: Distance walked: 20 Assistive device utilized: None Level of assistance: Complete Independence Comments: minimal hip extension Lt, Lt lateral trunk shift, increased weight through Lt LE   TODAY'S TREATMENT                                       09/09/22 NuStep L5 x26mins  Rows and lats 10# 2x10  LAQ 1# 2x10 HS curls red 2x10  Seated abd yellow x10  Standing shoulder flexion 1# x10 Standing shoulder extension 1# x10 STS on airex 3x5   09/04/22 NuStep L5 x36mins  Standing rows and ext redTB 2x10 STS on airex 2x5 Heel taps 4" standing on airex- minA   Ball squeezes 2x10 Horizontal abd yellow 2x10 Pball roll outs x10 Walking on beam  08/28/22 NuStep L4 x71mins  Standing shoulder flexion 2# 2x10 Seated abd red 2x10 HS curls red 2x10 LAQ 1# 2x10 Marching 1# 20 reps Lat pull downs 10# 2x10   Walking on beam  Up and down steps 1x    08/26/22 NuStep L4 x 6 min Seated rows 5lb, Lats 10lb 2x10  R ankle TB red 2x10 R  STS on airex x10  HS curls red 2x10 Add ball squeeze 2x10 Side step and tandem walking on airex in // bars Heel raises on airex in // bars Steps 4 and 6 in over and back Bilateral shoulder flex and exr 1lb WaTE x10  08/14/22 NuStep L5 x59mins  Step ups 4" at stairs  R ankle TB red 2x10 R  STS on airex x10  Seated ball roll outs x10 Sea7ed rows 5# x10 Lat  pull downs 10# 2x10  blackTB ext x10 Walking on beam    08/12/22 Walking on beam Cones taps on airex in bars Calf raises on airex 2x10 in bars Hitting ball while standing on airex in bars  Standing on airex rows and ext red 2x10 NuStep L5 x49mins Lat pull downs 10# 2x10 STS push out with green ball x10    08/05/22: NuStep L5x79mins Calf stretch 30s seated with  strap x 2 each leg  Seated ball roll out x10 forwards and then each side 5xSTS re-test 22.44 sec  STS on airex x10 Marching on airex 20 reps Catch standing on airex Lat pull 10#, 15x Seated rows 5#, 15x   07/24/22 NuStep L5x6mins Calf stretch 30s  Calf raises x15 Toes raises x15  Seated ball roll out x10 forwards and then each side 5xSTS re-test STS on airex x10 Marching on airex 20 reps Catch standing on airex, then with feet together  07/22/22 Nustep L 5 58min Black bar DF/PF 15 x each Airex marching,hip flex,ext and abd alt 20x in // bars-Foam beams in //bars Obstacles step over step and side stepping Step taps 4 inch Lat pull and seated row  07/17/22 NuStep L5 x85mins R ankle TB x10 Lat pulls downs 10# 2x10 Seated row 5# 2x10 Cone taps 20 reps alternating x2 Side steps over obstacles  Marching on airex   07/15/22 Nustep level 5 x 7 minutes Sit fit ankle motions Yellow tband ankle exercises 4 ways Lat pulls 10# 2x10 5# seated row 2x10 Supine feet on ball K2C, trunk rotation, small bridges and isometric abs PT gentle cervical distraction patient in supine Side step over sticks Cone toe touches Side step on and off airex On airex reaching, head turns and eyes closed  07/10/22 NuStep L4 x 6 minutes Standing, step forward with RLE, pick up a cone, then step back and ER RLE, reach back and place the cone on the mat. 5 x to each side, no instability noted. B side stepping on air ex plank 6 x each direction. Standing on air ex-static, lat weight shifts, for/back, all fairly easy for patient. Stand on air  ex with narrow BOS, more challenging during weight shifts. Horizontal head turns, challenging, Vertical head turns easy. 4 square stepping over lines on floor, moving as quickly as possible in each direction. Able to increase speed without unsteadiness. Legs fatigued. Ambulation- forward with horizontal head turns, 2 x 25', then walking backwards, 2 x 25', mildly unsteady, but no LOB, CGA. Forward bend to pick up cone at 10", turn to R and reach up to place it on a table. 5 x each direction, no unsteadiness noted Alternately tapping 4" step with each foot, no UE support, increased speed, x 30 sec, no LOB.  PATIENT EDUCATION:  Education details: mechanics sit to stand,  Person educated: Patient and Child(ren) Education method: Explanation, Demonstration, and Handouts Education comprehension: verbalized understanding and returned demonstration  HOME EXERCISE PROGRAM: Access Code: [email or web address],  [email or web address] URL: [email or web address] Date: 04/09/2022 Prepared by: [facility]  ASSESSMENT:  CLINICAL IMPRESSION:  Reports ongoing pain in especially in knees. Continued with low level functional strengthening. Patient feels as if she has reached the peak of therapy and will not continue to get much benefit, planned to d/c next visit as POC runs out.    OBJECTIVE IMPAIRMENTS: Abnormal gait, decreased activity tolerance, decreased balance, decreased knowledge of use of DME, decreased mobility, difficulty walking, decreased ROM, decreased strength, increased edema, impaired flexibility, improper body mechanics, postural dysfunction, and pain.   ACTIVITY LIMITATIONS: bending, standing, squatting, sleeping, stairs, and locomotion level  PARTICIPATION LIMITATIONS: cleaning, driving, community activity, and yard work  PERSONAL FACTORS: Age, Past/current experiences, Time since onset of injury/illness/exacerbation, and 3+ comorbidities: knee OA, lumbar  DDD and scoliosis  are also affecting patient's functional outcome.   REHAB POTENTIAL: Good  CLINICAL DECISION MAKING: Evolving/moderate complexity  EVALUATION COMPLEXITY:  High   GOALS: Goals reviewed with patient? Yes  SHORT TERM GOALS: Target date: 05/10/22 Pt will be independent with her initial HEP to improve LE strength and pain. Baseline: Goal status: met 05/06/22  2.  Pt will be able to demonstrate proper body mechanics with bed mobility to decrease strain on her low back. Baseline:  Goal status: met 05/06/22   LONG TERM GOALS: Target date: 09/02/22  Pt will be able to complete 5x sit to stand in less than 15 sec with or without UE support, to reflect improvements in LE mobility and strength. Baseline: 23sec, 12/14-16.7, 07/24/22-16.10  08/05/22: 22.44 sec  Goal status: ongoing  2.  Pt will report atleast 40% improvement in her knee and low back pain from the start of PT. Baseline: 12/14-Back pain is slightly better, knee pain remains. 08/05/22: not much change Goal status: ongoing  3.  Pt will be able to ambulate with LRAD outdoors x24ft without assistance, to reflect improvement in her steadiness with ambulation. Baseline: 12/14 uses RW, walks outdoors x 20 minutes. Goal status: met  4.  Pt will be able to maintain SLS for up to 2 sec each LE, 2/3 trials. Baseline: 12/14-Stood on R x 3 sec on one attempt. All others < 2 sec on each leg. 08/05/22:  able to maintain over 10 sec each with one hand support, unsupported 1 -2 sec  Goal status: ongoing   PLAN:  PT FREQUENCY: 2x/week  PT DURATION: 4 weeks to 09/02/22  PLANNED INTERVENTIONS: Therapeutic exercises, Therapeutic activity, Neuromuscular re-education, Balance training, Gait training, Patient/Family education, Self Care, Joint mobilization, Aquatic Therapy, Electrical stimulation, Cryotherapy, Manual therapy, and Re-evaluation  PLAN FOR NEXT SESSION progress LE strength, lumbar/thoracic flexibility and balance  activity . Needs a lot of work on her balance. Assess goals   Cheri Fowler PTA 09/09/22 11:45 AM [facility] [place]. [place], [place], [address] Phone: [phone]   Fax:  [phone]

## 2022-09-09 ENCOUNTER — Ambulatory Visit: Payer: Medicare Other

## 2022-09-09 DIAGNOSIS — R2681 Unsteadiness on feet: Secondary | ICD-10-CM | POA: Diagnosis not present

## 2022-09-09 DIAGNOSIS — G8929 Other chronic pain: Secondary | ICD-10-CM

## 2022-09-09 DIAGNOSIS — M6281 Muscle weakness (generalized): Secondary | ICD-10-CM

## 2022-09-09 DIAGNOSIS — R262 Difficulty in walking, not elsewhere classified: Secondary | ICD-10-CM

## 2022-09-09 DIAGNOSIS — R2689 Other abnormalities of gait and mobility: Secondary | ICD-10-CM

## 2022-09-10 NOTE — Progress Notes (Signed)
Office Visit Note  Patient: Danielle Dean             Date of Birth: 09/13/1928           MRN: [id number]             PCP: Georgann HousekeeperHusain, Karrar, MD Referring: Georgann HousekeeperHusain, Karrar, MD Visit Date: 09/23/2022 Occupation: @GUAROCC @  Subjective:  Pain in both knees  History of Present Illness: Danielle Dean is a 89 y.o. female with history of osteoarthritis and osteoporosis.  She came to do to get bilateral knee joint Orthovisc injections.  She continues to have pain and discomfort in her bilateral hands, knee joints and her feet.  She also complains of chronic discomfort in her spine.  She has not noticed any joint swelling.  She had inadequate response to cortisone injections in the past.  She is coming to get viscosupplement injections today.    Activities of Daily Living:  Patient reports morning stiffness for a few minutes.   Patient Denies nocturnal pain.  Difficulty dressing/grooming: Reports Difficulty climbing stairs: Reports Difficulty getting out of chair: Denies Difficulty using hands for taps, buttons, cutlery, and/or writing: Reports  Review of Systems  Constitutional:  Negative for fatigue.  HENT:  Negative for mouth sores and mouth dryness.   Eyes:  Positive for dryness.  Respiratory:  Negative for shortness of breath.   Cardiovascular:  Negative for chest pain and palpitations.  Gastrointestinal:  Negative for blood in stool, constipation and diarrhea.  Endocrine: Negative for increased urination.  Genitourinary:  Negative for involuntary urination.  Musculoskeletal:  Positive for joint pain, joint pain and morning stiffness. Negative for gait problem, joint swelling, myalgias, muscle weakness, muscle tenderness and myalgias.  Skin:  Negative for color change, rash, hair loss and sensitivity to sunlight.  Allergic/Immunologic: Negative for susceptible to infections.  Neurological:  Positive for numbness. Negative for dizziness and headaches.  Hematological:  Negative for  swollen glands.  Psychiatric/Behavioral:  Negative for depressed mood and sleep disturbance. The patient is not nervous/anxious.     PMFS History:  Patient Active Problem List   Diagnosis Date Noted   DDD (degenerative disc disease), thoracic 11/06/2020   Other idiopathic scoliosis, thoracolumbar region 11/06/2020   DDD (degenerative disc disease), lumbar 11/06/2020   Age-related osteoporosis without current pathological fracture 11/06/2020   Porokeratosis 11/26/2018   Corns and callosities 11/26/2018   History of right shoulder replacement 10/07/2016   Chronic pain of both knees 05/20/2016   Osteoarthritis of both hands 05/20/2016   Essential hypertension 05/20/2016   Environmental allergies 05/20/2016   Primary osteoarthritis of both knees 05/19/2016   Callus of foot 11/24/2012   Pain in joint, ankle and foot 09/15/2012    Past Medical History:  Diagnosis Date   Arthritis    Callus    Cataract fragments in both eyes following surgery    Corns and callosities    Hx of hysterectomy    Hypertension    Osteoarthritis     Family History  Problem Relation Age of Onset   Arthritis Mother    Diabetes Father    Past Surgical History:  Procedure Laterality Date   ABDOMINAL HYSTERECTOMY     NASAL SEPTUM SURGERY     ROTATOR CUFF REPAIR Right    TOTAL SHOULDER ARTHROPLASTY     Social History   Social History Narrative   Not on file   Immunization History  Administered Date(s) Administered   PFIZER(Purple Top)SARS-COV-2 Vaccination 07/06/2019, 07/27/2019, 03/10/2020  Objective: Vital Signs: BP (!) 155/69 (BP Location: Left Arm, Patient Position: Sitting, Cuff Size: Normal)   Pulse 65   Resp 13   Ht 5' (1.524 m)   Wt 101 lb (45.8 kg)   BMI 19.73 kg/m    Physical Exam Vitals and nursing note reviewed.  Constitutional:      Appearance: She is well-developed.  HENT:     Head: Normocephalic and atraumatic.  Eyes:     Conjunctiva/sclera: Conjunctivae normal.   Cardiovascular:     Rate and Rhythm: Normal rate and regular rhythm.     Heart sounds: Normal heart sounds.  Pulmonary:     Effort: Pulmonary effort is normal.     Breath sounds: Normal breath sounds.  Abdominal:     General: Bowel sounds are normal.     Palpations: Abdomen is soft.  Musculoskeletal:     Cervical back: Normal range of motion.     Right lower leg: Edema present.     Left lower leg: Edema present.  Lymphadenopathy:     Cervical: No cervical adenopathy.  Skin:    General: Skin is warm and dry.     Capillary Refill: Capillary refill takes less than 2 seconds.  Neurological:     Mental Status: She is alert and oriented to person, place, and time.  Psychiatric:        Behavior: Behavior normal.      Musculoskeletal Exam: Cervical spine was in good range of motion.  She had thoracic kyphosis.  Right shoulder joint had limited abduction and internal rotation.  Left shoulder joint was in full range of motion.  Elbows and wrist joints are in good range of motion.  She had bilateral PIP and DIP thickening and subluxation.  Hip joints and knee joints in good range of motion without any warmth swelling or effusion.  There was no tenderness over ankles or MTPs.  CDAI Exam: CDAI Score: -- Patient Global: --; Provider Global: -- Swollen: --; Tender: -- Joint Exam 09/23/2022   No joint exam has been documented for this visit   There is currently no information documented on the homunculus. Go to the Rheumatology activity and complete the homunculus joint exam.  Investigation: No additional findings.  Imaging: No results found.  Recent Labs: Lab Results  Component Value Date   WBC 7.5 01/07/2022   HGB 11.2 (L) 01/07/2022   PLT 219 01/07/2022   NA 130 (L) 01/07/2022   K 5.1 01/07/2022   CL 98 01/07/2022   CO2 26 01/07/2022   GLUCOSE 92 01/07/2022   BUN 21 01/07/2022   CREATININE 0.69 01/07/2022   BILITOT 0.5 01/07/2022   ALKPHOS 50 12/20/2020   AST 15  01/07/2022   ALT 13 01/07/2022   PROT 6.3 01/07/2022   ALBUMIN 3.6 12/20/2020   CALCIUM 8.9 01/07/2022   GFRAA 81 06/14/2020    Speciality Comments: Prolia: 06/27/20, 01/16/21,07/18/2021,01/16/22  Procedures:  Large Joint Inj: bilateral knee on 09/23/2022 11:33 AM Indications: pain Details: 25 G 1.5 in needle, medial approach  Arthrogram: No  Medications (Right): 1.5 mL lidocaine 1 %; 30 mg Hyaluronan 30 MG/2ML Aspirate (Right): 0 mL Medications (Left): 1.5 mL lidocaine 1 %; 30 mg Hyaluronan 30 MG/2ML Aspirate (Left): 0 mL Outcome: tolerated well, no immediate complications Procedure, treatment alternatives, risks and benefits explained, specific risks discussed. Consent was given by the patient. Immediately prior to procedure a time out was called to verify the correct patient, procedure, equipment, support staff and site/side marked as  required. Patient was prepped and draped in the usual sterile fashion.     Allergies: Actonel [risedronate], Fosamax [alendronate], and Lisinopril   Assessment / Plan:     Visit Diagnoses: History of right shoulder replacement-she continues to have limited range of motion of the shoulder joint.  She has chronic discomfort.  Rupture of left biceps tendon, sequela-it is not causing any discomfort.  Primary osteoarthritis of both hands-she is severely arthritis in her bilateral hands with PIP and DIP thickening and subluxation.  Joint protection muscle strengthening was discussed.  Primary osteoarthritis of both knees-she has severe osteoarthritis involving bilateral knee joints with chondromalacia patella.  She had an adequate response to cortisone injections in the past.  She was here to receive her first Orthovisc injection.  After informed consent was obtained bilateral knee joints were prepped and injected with lidocaine and Orthovisc.  She tolerated the procedure well.  Postprocedure instructions were given.  This patient is diagnosed with  osteoarthritis of the knee(s).    Radiographs show evidence of joint space narrowing, osteophytes, subchondral sclerosis and/or subchondral cysts.  This patient has knee pain which interferes with functional and activities of daily living.    This patient has experienced inadequate response, adverse effects and/or intolerance with conservative treatments such as acetaminophen, NSAIDS, topical creams, physical therapy or regular exercise, knee bracing and/or weight loss.   This patient has experienced inadequate response or has a contraindication to intra articular steroid injections for at least 3 months.   This patient is not scheduled to have a total knee replacement within 6 months of starting treatment with viscosupplementation.   Pain in right ankle and joints of right foot -she continues to have discomfort in her feet.  X-rays of right foot showed osteoarthritic changes.  Proper fitting shoes were advised.  DDD (degenerative disc disease), thoracic-she has thoracic kyphosis with chronic discomfort.  Other idiopathic scoliosis, thoracolumbar region  DDD (degenerative disc disease), lumbar-she has chronic lower back pain.  Age-related osteoporosis without current pathological fracture - May 04, 2020 DEXA scan one third left distal radius BMD 0.421 and T-score -4.6.  She has been on Prolia since January 2022.  Last Prolia July 28, 2022.  Use of calcium rich diet and vitamin D was advised.  Increased risk of ONJ and atypical fracture was discussed.  Dental hygiene was emphasized.  A DEXA scan was a scheduled at the last visit.  She was advised to get repeat DEXA scan.  Encounter for medication administration  Pedal edema-she continues to have mild pitting edema.  History of hypertension-blood pressure was elevated at 155/69.  Blood pressure was repeated which was also elevated.  She was advised to monitor blood pressure closely and follow-up with the PCP.  Orders: Orders Placed  This Encounter  Procedures   Large Joint Inj: bilateral knee   No orders of the defined types were placed in this encounter.    Follow-Up Instructions: Return in about 6 months (around 03/25/2023) for Osteoarthritis, Osteoporosis.   Pollyann Savoy, MD  Note - This record has been created using Animal nutritionist.  Chart creation errors have been sought, but may not always  have been located. Such creation errors do not reflect on  the standard of medical care.

## 2022-09-10 NOTE — Therapy (Signed)
OUTPATIENT PHYSICAL THERAPY LOWER EXTREMITY TREATMENT    Patient Name: Danielle Dean MRN: [id number] DOB:10/24/1928, 89 y.o., female Today's Date: 09/11/2022   PT End of Session - 09/11/22 1015     Visit Number 27    Date for PT Re-Evaluation 09/11/22    PT Start Time 1015    PT Stop Time 1100    PT Time Calculation (min) 45 min    Activity Tolerance Patient tolerated treatment well    Behavior During Therapy WFL for tasks assessed/performed                   Past Medical History:  Diagnosis Date   Arthritis    Callus    Cataract fragments in both eyes following surgery    Corns and callosities    Hx of hysterectomy    Hypertension    Osteoarthritis    Past Surgical History:  Procedure Laterality Date   ABDOMINAL HYSTERECTOMY     NASAL SEPTUM SURGERY     ROTATOR CUFF REPAIR Right    TOTAL SHOULDER ARTHROPLASTY     Patient Active Problem List   Diagnosis Date Noted   DDD (degenerative disc disease), thoracic 11/06/2020   Other idiopathic scoliosis, thoracolumbar region 11/06/2020   DDD (degenerative disc disease), lumbar 11/06/2020   Age-related osteoporosis without current pathological fracture 11/06/2020   Porokeratosis 11/26/2018   Corns and callosities 11/26/2018   History of right shoulder replacement 10/07/2016   Chronic pain of both knees 05/20/2016   Osteoarthritis of both hands 05/20/2016   Essential hypertension 05/20/2016   Environmental allergies 05/20/2016   Primary osteoarthritis of both knees 05/19/2016   Callus of foot 11/24/2012   Pain in joint, ankle and foot 09/15/2012    PCP: Wenda Low, MD  REFERRING PROVIDER: Bo Merino, MD  REFERRING DIAG: Primary OA Bil knees, DDD lumbar, poor balance  THERAPY DIAG:  Unsteadiness on feet  Difficulty in walking, not elsewhere classified  Chronic pain of left knee  Chronic pain of right knee  Muscle weakness (generalized)  Other abnormalities of gait and  mobility  Rationale for Evaluation and Treatment Rehabilitation  ONSET DATE: chronic issues-several years   SUBJECTIVE:   SUBJECTIVE STATEMENT: "As usual"   PERTINENT HISTORY: Rt shoulder replacement.  DDD Lumbar/cervical spine. scoliosis PAIN:  Are you having pain? Yes: NPRS scale: 4/10 Pain location: knee/legs Pain description: aching Aggravating factors: prolonged standing, walking; low back pain with standing activity Relieving factors: sitting, wearing knee braces  PRECAUTIONS: Fall  WEIGHT BEARING RESTRICTIONS: No  FALLS:  Has patient fallen in last 6 months? No  LIVING ENVIRONMENT: Lives with: lives with their family and lives alone Lives in: House/apartment Stairs: doesn't need to use steps in her home Has following equipment at home:  rollator , [facility]  OCCUPATION: retired   PLOF: Independent with basic ADLs  PATIENT GOALS: decrease pain in knees/back; improve steadiness    OBJECTIVE:   DIAGNOSTIC FINDINGS:   PATIENT SURVEYS:  FOTO: next visit  COGNITION: Overall cognitive status: Within functional limits for tasks assessed     SENSATION: Not tested denies NT  EDEMA:  None noted at eval, will sometimes have swelling in Lt ankle   MUSCLE LENGTH: Hamstrings: Right 90/90 45 deg ; Left 90/90 45 deg   POSTURE: decreased lumbar lordosis and weight shift left trunk lateral shift Lt   PALPATION: Tenderness Rt glute max  LOWER EXTREMITY ROM:  Passive ROM Right eval Left eval  Hip flexion    Hip  extension    Hip abduction    Hip adduction    Hip internal rotation    Hip external rotation    Knee flexion WNL WNL  Knee extension WNL WNL  Ankle dorsiflexion    Ankle plantarflexion    Ankle inversion    Ankle eversion     (Blank rows = not tested)   LOWER EXTREMITY MMT:  MMT Right eval Left eval Right/left 06/26/22  Hip flexion 3+/pain 4 4-/4  Hip extension 3 3   Hip abduction 3 3 4-/4-  Hip adduction     Hip internal rotation      Hip external rotation     Knee flexion 3+ 3+ 4-/4-  Knee extension 3+ 3+/pain 4-/4- pain  Ankle dorsiflexion 4 4   Ankle plantarflexion     Ankle inversion     Ankle eversion      (Blank rows = not tested) LUMBAR ROM:   AROM eval  Flexion WNL  Extension Unable to reach neutral  Right lateral flexion   Left lateral flexion   Right rotation 75% limited  Left rotation 75% limited   (Blank rows = not tested)  ] LOWER EXTREMITY SPECIAL TESTS:    FUNCTIONAL TESTS:  5 times sit to stand: 23 sec, weight shift Lt, UE assist  BERG balance test 06/26/22  = 38/56 GAIT: Distance walked: 20 Assistive device utilized: None Level of assistance: Complete Independence Comments: minimal hip extension Lt, Lt lateral trunk shift, increased weight through Lt LE   TODAY'S TREATMENT                                       09/11/22 5xSTS 15.86s SLS 2s at best NuStep L5x54mins Hip abd red 2x10 Seated hip flexion red band 2x10 Ball squeezes 2x10 Ball squeeze with LAQ 2x10  Seated rows and ext redTB 2x10 2# WaTE bicep curls 2x10 Walking on beam  Stairs x1  09/09/22 NuStep L5 x14mins  Rows and lats 10# 2x10  LAQ 1# 2x10 HS curls red 2x10  Seated abd yellow x10  Standing shoulder flexion 1# x10 Standing shoulder extension 1# x10 STS on airex 3x5   09/04/22 NuStep L5 x24mins  Standing rows and ext redTB 2x10 STS on airex 2x5 Heel taps 4" standing on airex- minA   Ball squeezes 2x10 Horizontal abd yellow 2x10 Pball roll outs x10 Walking on beam  08/28/22 NuStep L4 x74mins  Standing shoulder flexion 2# 2x10 Seated abd red 2x10 HS curls red 2x10 LAQ 1# 2x10 Marching 1# 20 reps Lat pull downs 10# 2x10   Walking on beam  Up and down steps 1x    08/26/22 NuStep L4 x 6 min Seated rows 5lb, Lats 10lb 2x10  R ankle TB red 2x10 R  STS on airex x10  HS curls red 2x10 Add ball squeeze 2x10 Side step and tandem walking on airex in // bars Heel raises on airex in // bars Steps 4  and 6 in over and back Bilateral shoulder flex and exr 1lb WaTE x10  08/14/22 NuStep L5 x77mins  Step ups 4" at stairs  R ankle TB red 2x10 R  STS on airex x10  Seated ball roll outs x10 Sea7ed rows 5# x10 Lat pull downs 10# 2x10  blackTB ext x10 Walking on beam    08/12/22 Walking on beam Cones taps on airex in bars Calf raises on airex 2x10  in bars Hitting ball while standing on airex in bars  Standing on airex rows and ext red 2x10 NuStep L5 x31mins Lat pull downs 10# 2x10 STS push out with green ball x10    08/05/22: NuStep L5x68mins Calf stretch 30s seated with strap x 2 each leg  Seated ball roll out x10 forwards and then each side 5xSTS re-test 22.44 sec  STS on airex x10 Marching on airex 20 reps Catch standing on airex Lat pull 10#, 15x Seated rows 5#, 15x   07/24/22 NuStep L5x97mins Calf stretch 30s  Calf raises x15 Toes raises x15  Seated ball roll out x10 forwards and then each side 5xSTS re-test STS on airex x10 Marching on airex 20 reps Catch standing on airex, then with feet together  07/22/22 Nustep L 5 34min Black bar DF/PF 15 x each Airex marching,hip flex,ext and abd alt 20x in // bars-Foam beams in //bars Obstacles step over step and side stepping Step taps 4 inch Lat pull and seated row  07/17/22 NuStep L5 x72mins R ankle TB x10 Lat pulls downs 10# 2x10 Seated row 5# 2x10 Cone taps 20 reps alternating x2 Side steps over obstacles  Marching on airex   07/15/22 Nustep level 5 x 7 minutes Sit fit ankle motions Yellow tband ankle exercises 4 ways Lat pulls 10# 2x10 5# seated row 2x10 Supine feet on ball K2C, trunk rotation, small bridges and isometric abs PT gentle cervical distraction patient in supine Side step over sticks Cone toe touches Side step on and off airex On airex reaching, head turns and eyes closed  07/10/22 NuStep L4 x 6 minutes Standing, step forward with RLE, pick up a cone, then step back and ER RLE, reach back and  place the cone on the mat. 5 x to each side, no instability noted. B side stepping on air ex plank 6 x each direction. Standing on air ex-static, lat weight shifts, for/back, all fairly easy for patient. Stand on air ex with narrow BOS, more challenging during weight shifts. Horizontal head turns, challenging, Vertical head turns easy. 4 square stepping over lines on floor, moving as quickly as possible in each direction. Able to increase speed without unsteadiness. Legs fatigued. Ambulation- forward with horizontal head turns, 2 x 25', then walking backwards, 2 x 25', mildly unsteady, but no LOB, CGA. Forward bend to pick up cone at 10", turn to R and reach up to place it on a table. 5 x each direction, no unsteadiness noted Alternately tapping 4" step with each foot, no UE support, increased speed, x 30 sec, no LOB.  PATIENT EDUCATION:  Education details: mechanics sit to stand,  Person educated: Patient and Child(ren) Education method: Explanation, Demonstration, and Handouts Education comprehension: verbalized understanding and returned demonstration  HOME EXERCISE PROGRAM: Access Code: [email or web address],  [email or web address] URL: [email or web address] Date: 04/09/2022 Prepared by: [facility]  ASSESSMENT:  CLINICAL IMPRESSION:  Reports ongoing pain in especially in knees. After 27 visits, patient has made very little progress in regards to her pain but has met or is close to meeting other goals. She think she has been to enough visits and will continue to try to complete exercises at home. Updated HEP.    OBJECTIVE IMPAIRMENTS: Abnormal gait, decreased activity tolerance, decreased balance, decreased knowledge of use of DME, decreased mobility, difficulty walking, decreased ROM, decreased strength, increased edema, impaired flexibility, improper body mechanics, postural dysfunction, and pain.   ACTIVITY LIMITATIONS: bending, standing,  squatting, sleeping,  stairs, and locomotion level  PARTICIPATION LIMITATIONS: cleaning, driving, community activity, and yard work  PERSONAL FACTORS: Age, Past/current experiences, Time since onset of injury/illness/exacerbation, and 3+ comorbidities: knee OA, lumbar DDD and scoliosis  are also affecting patient's functional outcome.   REHAB POTENTIAL: Good  CLINICAL DECISION MAKING: Evolving/moderate complexity  EVALUATION COMPLEXITY: High   GOALS: Goals reviewed with patient? Yes  SHORT TERM GOALS: Target date: 05/10/22 Pt will be independent with her initial HEP to improve LE strength and pain. Baseline: Goal status: met 05/06/22  2.  Pt will be able to demonstrate proper body mechanics with bed mobility to decrease strain on her low back. Baseline:  Goal status: met 05/06/22   LONG TERM GOALS: Target date: 09/02/22  Pt will be able to complete 5x sit to stand in less than 15 sec with or without UE support, to reflect improvements in LE mobility and strength. Baseline: 23sec, 12/14-16.7, 07/24/22-16.10  08/05/22: 22.44 sec 09/11/22- 15.86s Goal status: ongoing  2.  Pt will report atleast 40% improvement in her knee and low back pain from the start of PT. Baseline: 12/14-Back pain is slightly better, knee pain remains. 08/05/22: not much change, 09/11/22- no change Goal status: ongoing  3.  Pt will be able to ambulate with LRAD outdoors x283ft without assistance, to reflect improvement in her steadiness with ambulation. Baseline: 12/14 uses RW, walks outdoors x 20 minutes. Goal status: met  4.  Pt will be able to maintain SLS for up to 2 sec each LE, 2/3 trials. Baseline: 12/14-Stood on R x 3 sec on one attempt. All others < 2 sec on each leg. 08/05/22:  able to maintain over 10 sec each with one hand support, unsupported 1 -2 sec, 09/11/22- 2s at best both sides Goal status: met   PLAN:  PT FREQUENCY: 2x/week  PT DURATION: 4 weeks to 09/02/22  PLANNED INTERVENTIONS:  Therapeutic exercises, Therapeutic activity, Neuromuscular re-education, Balance training, Gait training, Patient/Family education, Self Care, Joint mobilization, Aquatic Therapy, Electrical stimulation, Cryotherapy, Manual therapy, and Re-evaluation  PLAN FOR NEXT SESSION   PHYSICAL THERAPY DISCHARGE SUMMARY  Visits from Start of Care: 27   Patient agrees to discharge. Patient goals were partially met. Patient is being discharged due to maximized rehab potential.    Andris Baumann, DPT 09/11/22 10:57 AM [facility] [place]. [place], [place], [address] Phone: [phone]   Fax:  [phone]

## 2022-09-11 ENCOUNTER — Ambulatory Visit: Payer: Medicare Other

## 2022-09-11 ENCOUNTER — Telehealth: Payer: Self-pay | Admitting: Rheumatology

## 2022-09-11 DIAGNOSIS — G8929 Other chronic pain: Secondary | ICD-10-CM

## 2022-09-11 DIAGNOSIS — R262 Difficulty in walking, not elsewhere classified: Secondary | ICD-10-CM

## 2022-09-11 DIAGNOSIS — R2681 Unsteadiness on feet: Secondary | ICD-10-CM | POA: Diagnosis not present

## 2022-09-11 DIAGNOSIS — M6281 Muscle weakness (generalized): Secondary | ICD-10-CM

## 2022-09-11 DIAGNOSIS — R2689 Other abnormalities of gait and mobility: Secondary | ICD-10-CM

## 2022-09-11 NOTE — Telephone Encounter (Signed)
Patient's son Vicenta Aly called stating Danielle Dean is experiencing a lot of pain in both knees.  She is scheduled for an appointment with Dr. Estanislado Pandy on 09/23/22 and is checking if she needs cortisone injections or gel injections.  Ajay requested a return call at #[phone]

## 2022-09-11 NOTE — Telephone Encounter (Signed)
Patient cannot have another cortisone injection until after May 15.  If she is approved for Visco supplement injections she can be scheduled to receive them through me or Lovena Le.

## 2022-09-11 NOTE — Telephone Encounter (Signed)
Contacted Ajay and advised patient cannot have another cortisone injection until after May 15. Advised if she is approved for Visco supplement injections she can be scheduled to receive them through Dr. Estanislado Pandy or Lovena Le. They are agreeable to the visco supplementation.   Please apply for Visco bilateral knees.

## 2022-09-15 NOTE — Telephone Encounter (Signed)
Please call to schedule visco injections.  Approved for Orthovisc, Bilateral knee(s). [place] does apply Since the deductible has been met, patient is responsible for 20% coinsurance Prior Authorization is not required

## 2022-09-23 ENCOUNTER — Encounter: Payer: Self-pay | Admitting: Rheumatology

## 2022-09-23 ENCOUNTER — Ambulatory Visit: Payer: Medicare Other | Attending: Rheumatology | Admitting: Rheumatology

## 2022-09-23 VITALS — BP 173/69 | HR 66 | Resp 13 | Ht 60.0 in | Wt 101.0 lb

## 2022-09-23 DIAGNOSIS — Z96611 Presence of right artificial shoulder joint: Secondary | ICD-10-CM | POA: Insufficient documentation

## 2022-09-23 DIAGNOSIS — Z7689 Persons encountering health services in other specified circumstances: Secondary | ICD-10-CM

## 2022-09-23 DIAGNOSIS — M4125 Other idiopathic scoliosis, thoracolumbar region: Secondary | ICD-10-CM | POA: Insufficient documentation

## 2022-09-23 DIAGNOSIS — Z8679 Personal history of other diseases of the circulatory system: Secondary | ICD-10-CM | POA: Insufficient documentation

## 2022-09-23 DIAGNOSIS — M25571 Pain in right ankle and joints of right foot: Secondary | ICD-10-CM | POA: Diagnosis present

## 2022-09-23 DIAGNOSIS — M19042 Primary osteoarthritis, left hand: Secondary | ICD-10-CM | POA: Diagnosis present

## 2022-09-23 DIAGNOSIS — R600 Localized edema: Secondary | ICD-10-CM | POA: Diagnosis present

## 2022-09-23 DIAGNOSIS — M5134 Other intervertebral disc degeneration, thoracic region: Secondary | ICD-10-CM

## 2022-09-23 DIAGNOSIS — M170 Bilateral primary osteoarthritis of knee: Secondary | ICD-10-CM | POA: Insufficient documentation

## 2022-09-23 DIAGNOSIS — M19041 Primary osteoarthritis, right hand: Secondary | ICD-10-CM

## 2022-09-23 DIAGNOSIS — M5136 Other intervertebral disc degeneration, lumbar region: Secondary | ICD-10-CM | POA: Insufficient documentation

## 2022-09-23 DIAGNOSIS — M810 Age-related osteoporosis without current pathological fracture: Secondary | ICD-10-CM | POA: Diagnosis present

## 2022-09-23 DIAGNOSIS — S46212S Strain of muscle, fascia and tendon of other parts of biceps, left arm, sequela: Secondary | ICD-10-CM | POA: Diagnosis not present

## 2022-09-23 MED ORDER — HYALURONAN 30 MG/2ML IX SOSY
30.0000 mg | PREFILLED_SYRINGE | INTRA_ARTICULAR | Status: AC | PRN
  Administered 2022-09-23: 30 mg via INTRA_ARTICULAR

## 2022-09-23 MED ORDER — LIDOCAINE HCL 1 % IJ SOLN
1.5000 mL | INTRAMUSCULAR | Status: AC | PRN
  Administered 2022-09-23: 1.5 mL

## 2022-09-30 ENCOUNTER — Ambulatory Visit (INDEPENDENT_AMBULATORY_CARE_PROVIDER_SITE_OTHER): Payer: Medicare Other

## 2022-09-30 ENCOUNTER — Ambulatory Visit: Payer: Medicare Other | Attending: Physician Assistant | Admitting: Rheumatology

## 2022-09-30 ENCOUNTER — Ambulatory Visit: Payer: Medicare Other

## 2022-09-30 DIAGNOSIS — M170 Bilateral primary osteoarthritis of knee: Secondary | ICD-10-CM | POA: Insufficient documentation

## 2022-09-30 MED ORDER — HYALURONAN 30 MG/2ML IX SOSY
30.0000 mg | PREFILLED_SYRINGE | INTRA_ARTICULAR | Status: AC | PRN
Start: 2022-09-30 — End: 2022-09-30
  Administered 2022-09-30: 30 mg via INTRA_ARTICULAR

## 2022-09-30 MED ORDER — LIDOCAINE HCL 1 % IJ SOLN
1.5000 mL | INTRAMUSCULAR | Status: AC | PRN
Start: 2022-09-30 — End: 2022-09-30
  Administered 2022-09-30: 1.5 mL

## 2022-09-30 NOTE — Progress Notes (Signed)
   Procedure Note  Patient: Danielle Dean             Date of Birth: 23-May-1929           MRN: [id number]             Visit Date: 09/30/2022  Procedures: Visit Diagnoses:  1. Primary osteoarthritis of both knees   Patient tolerated her first Orthovisc injection without any side effects.  She was here to receive her second Orthovisc injection to bilateral knee joints.  No warmth swelling or effusion was noted on the examination.  Skin was intact.  X-rays obtained today showed severe end-stage osteoarthritis of bilateral knee joints and bilateral chondromalacia patella.  Orthovisc #2 bilateral knees, B/B Large Joint Inj: bilateral knee on 09/30/2022 9:44 AM Indications: pain Details: 25 G 1.5 in needle, medial approach  Arthrogram: No  Medications (Right): 1.5 mL lidocaine 1 %; 30 mg Hyaluronan 30 MG/2ML Aspirate (Right): 0 mL Medications (Left): 1.5 mL lidocaine 1 %; 30 mg Hyaluronan 30 MG/2ML Aspirate (Left): 0 mL Outcome: tolerated well, no immediate complications Procedure, treatment alternatives, risks and benefits explained, specific risks discussed. Consent was given by the patient. Immediately prior to procedure a time out was called to verify the correct patient, procedure, equipment, support staff and site/side marked as required. Patient was prepped and draped in the usual sterile fashion.     Patient tolerated the procedure well.  Aftercare was discussed.  Pollyann Savoy, MD

## 2022-10-07 ENCOUNTER — Ambulatory Visit: Payer: Medicare Other | Attending: Physician Assistant | Admitting: Rheumatology

## 2022-10-07 DIAGNOSIS — M170 Bilateral primary osteoarthritis of knee: Secondary | ICD-10-CM | POA: Diagnosis not present

## 2022-10-07 MED ORDER — HYALURONAN 30 MG/2ML IX SOSY
30.0000 mg | PREFILLED_SYRINGE | INTRA_ARTICULAR | Status: AC | PRN
Start: 2022-10-07 — End: 2022-10-07
  Administered 2022-10-07: 30 mg via INTRA_ARTICULAR

## 2022-10-07 MED ORDER — LIDOCAINE HCL 1 % IJ SOLN
1.5000 mL | INTRAMUSCULAR | Status: AC | PRN
Start: 2022-10-07 — End: 2022-10-07
  Administered 2022-10-07: 1.5 mL

## 2022-10-07 NOTE — Progress Notes (Signed)
   Procedure Note  Patient: Danielle Dean             Date of Birth: 10-31-1928           MRN: [id number]             Visit Date: 10/07/2022  Procedures: Visit Diagnoses:  1. Primary osteoarthritis of both knees    Orthovisc #3 bilateral knees, B/B Large Joint Inj: bilateral knee on 10/07/2022 9:26 AM Indications: pain Details: 25 G 1.5 in needle, medial approach  Arthrogram: No  Medications (Right): 1.5 mL lidocaine 1 %; 30 mg Hyaluronan 30 MG/2ML Aspirate (Right): 0 mL Medications (Left): 1.5 mL lidocaine 1 %; 30 mg Hyaluronan 30 MG/2ML Aspirate (Left): 0 mL Outcome: tolerated well, no immediate complications Procedure, treatment alternatives, risks and benefits explained, specific risks discussed. Consent was given by the patient. Immediately prior to procedure a time out was called to verify the correct patient, procedure, equipment, support staff and site/side marked as required. Patient was prepped and draped in the usual sterile fashion.     Postprocedure instructions were given.  Patient wants to return for for follow-up of the as needed basis.  Pollyann Savoy, MD

## 2022-10-09 ENCOUNTER — Ambulatory Visit: Payer: Medicare Other | Admitting: Podiatry

## 2022-10-09 LAB — HM DEXA SCAN

## 2022-10-13 IMAGING — CT CT ABD-PELV W/ CM
2 of 8 series · 15 of 46 positions shown, 17 images · IV contrast (Omnipaque)
Comparison: 04/06/2006

CLINICAL DATA: Right lower quadrant abdominal pain.

EXAM:
CT ABDOMEN AND PELVIS WITH CONTRAST
TECHNIQUE: Multidetector CT imaging of the abdomen and pelvis was performed
using the standard protocol following bolus administration of
intravenous contrast.
CONTRAST:  100mL OMNIPAQUE IOHEXOL 300 MG/ML  SOLN

[Series 3: axial st · axial · 0.75mm/px · z∈[-338,-33]mm · 12 of 71 slices shown, 14 images]
[im 5/71  soft-tissue]
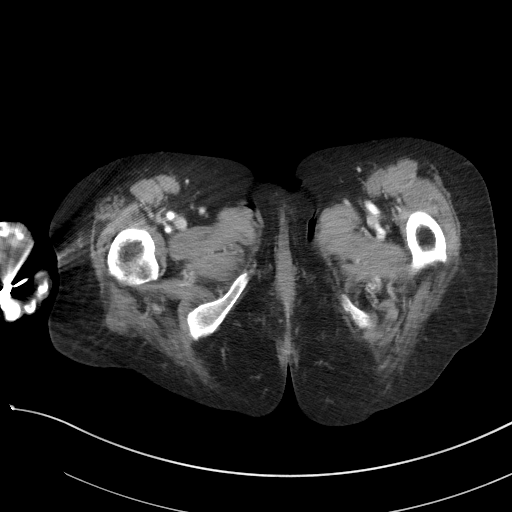
[im 5/71  bone]
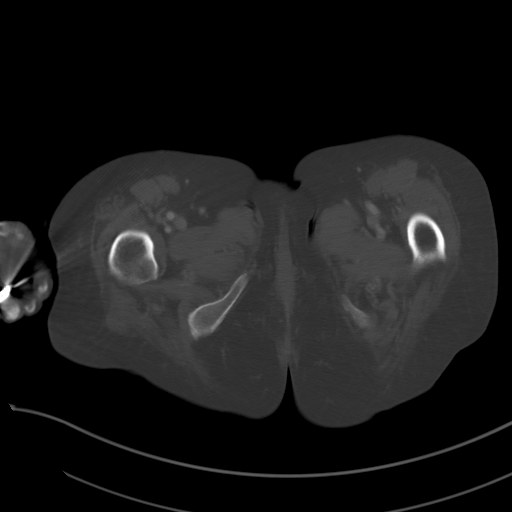
[im 9/71  soft-tissue]
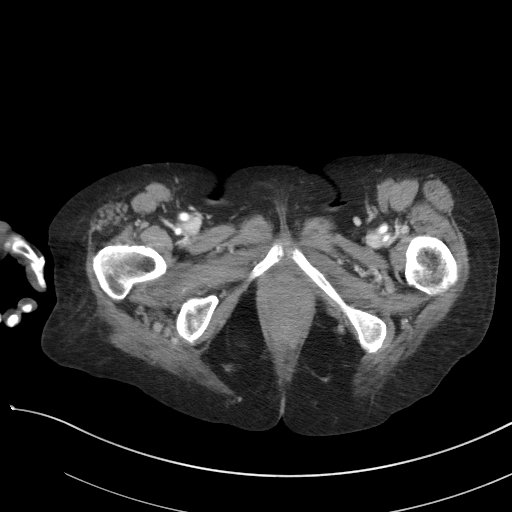
[im 18/71  soft-tissue]
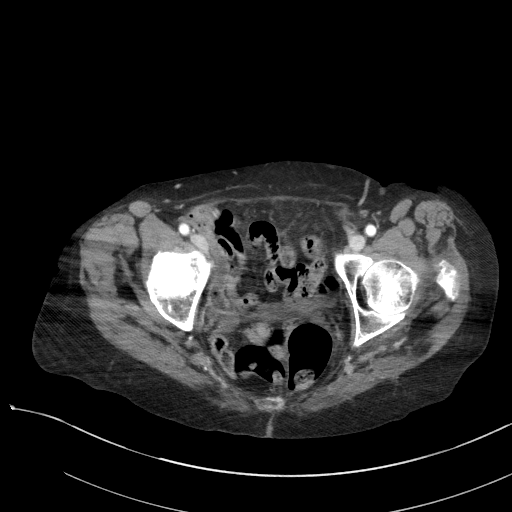
[im 22/71  soft-tissue]
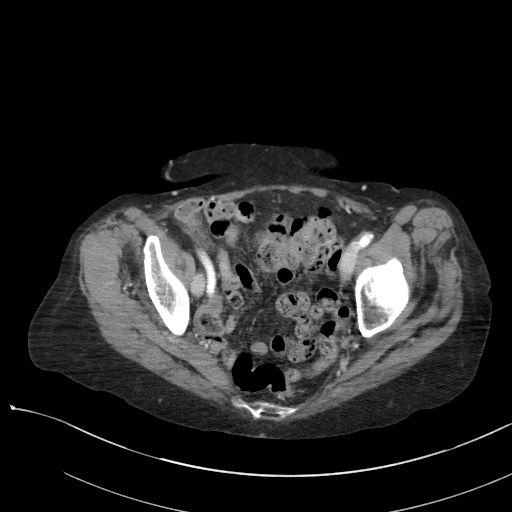
[im 27/71  soft-tissue]
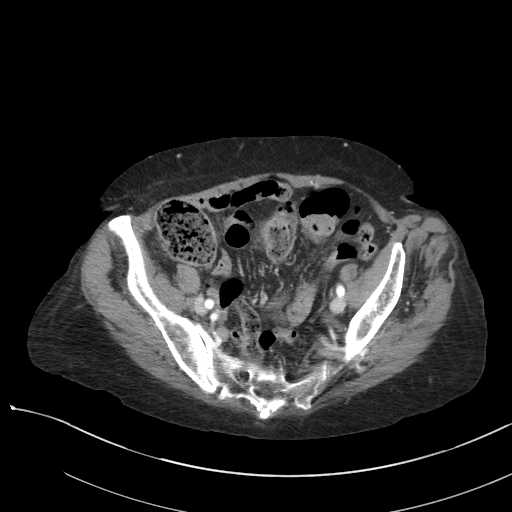
[im 31/71  soft-tissue]
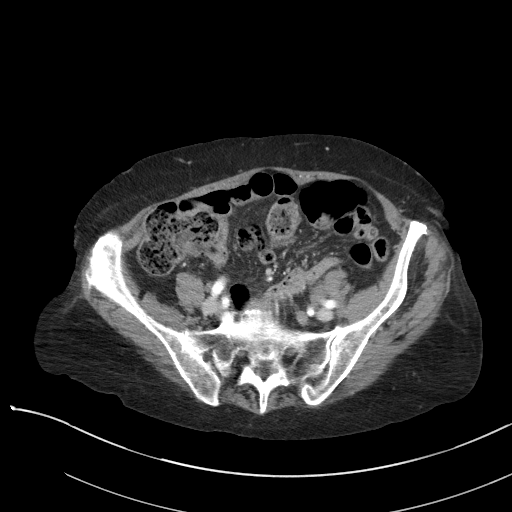
[im 40/71  soft-tissue]
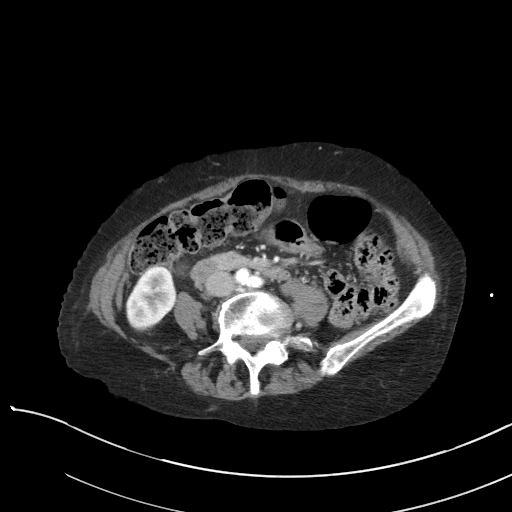
[im 44/71  soft-tissue]
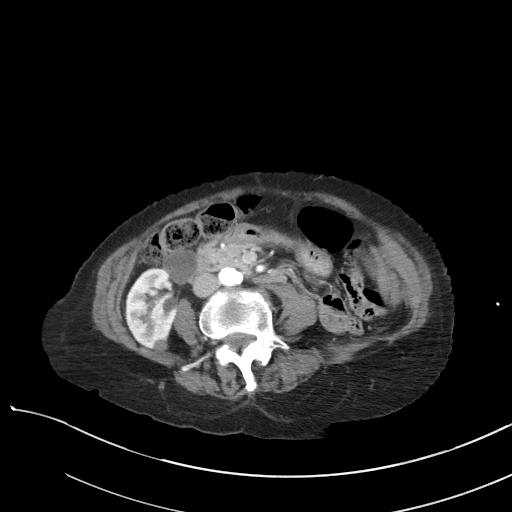
[im 49/71  soft-tissue]
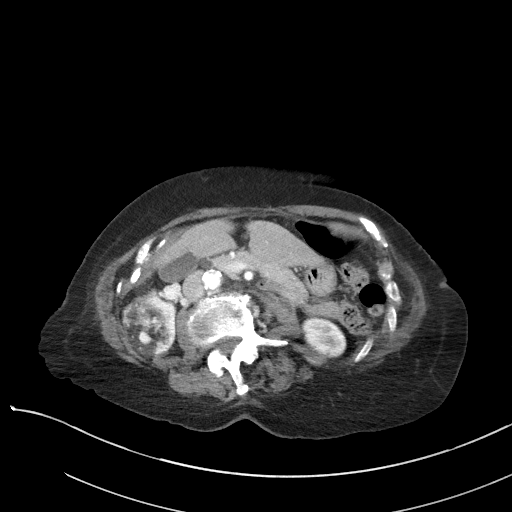
[im 49/71  bone]
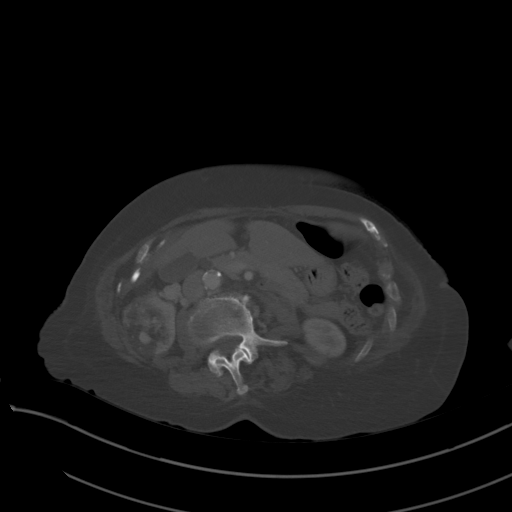
[im 53/71  soft-tissue]
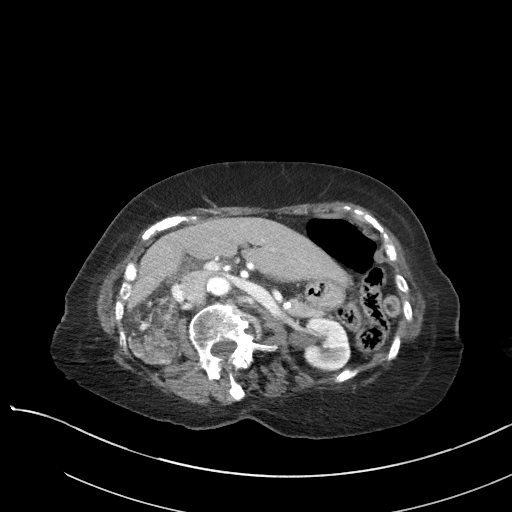
[im 62/71  soft-tissue]
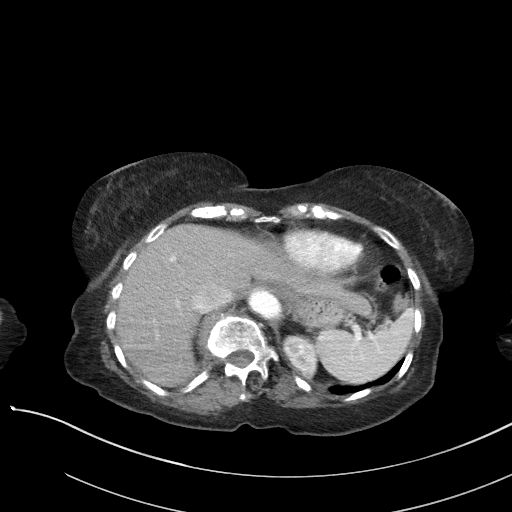
[im 66/71  soft-tissue]
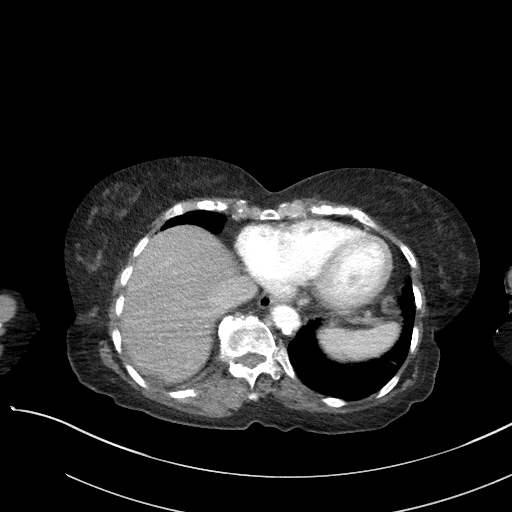

[Series 8: coronal st · coronal · 0.59mm/px · 3 of 71 slices shown]
[im 18/71  soft-tissue]
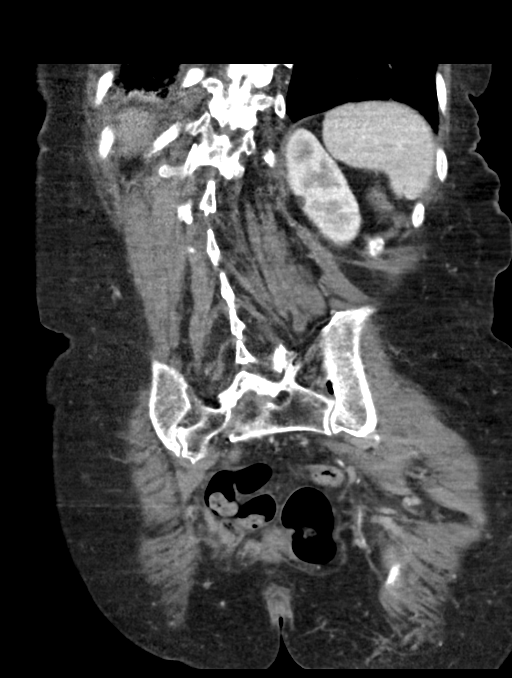
[im 36/71  soft-tissue]
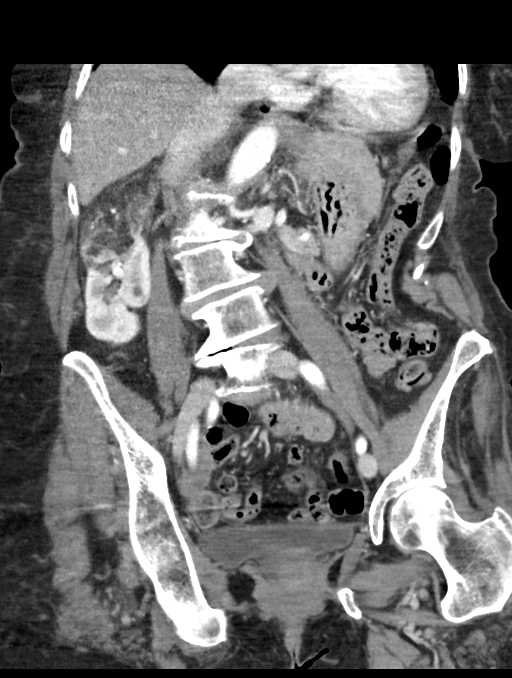
[im 53/71  soft-tissue]
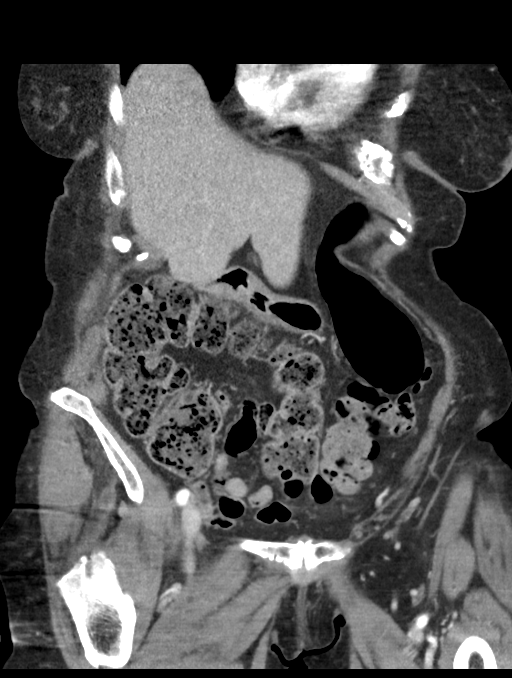

[15 of 46 positions shown; findings below may reference images not displayed]

FINDINGS: Lower chest: The cardio pericardial silhouette is enlarged. Minimal
atelectasis noted dependent right lower lobe.

Hepatobiliary: No suspicious focal abnormality within the liver
parenchyma. There is no evidence for gallstones, gallbladder wall
thickening, or pericholecystic fluid. No intrahepatic or
extrahepatic biliary dilation.

Pancreas: No focal mass lesion. No dilatation of the main duct. No
intraparenchymal cyst. No peripancreatic edema.

Spleen: No splenomegaly. No focal mass lesion.

Adrenals/Urinary Tract: No adrenal nodule or mass. 3.9 x 6.1 x
cm exophytic vascular mass with marked areas of fat density
identified in the upper pole right kidney, similar to prior and
consistent with angiomyolipoma. Tiny hypodensities in the left
kidney are too small to characterize. No evidence for hydroureter.
Bladder is largely decompressed.

Stomach/Bowel: Stomach is unremarkable. No gastric wall thickening.
No evidence of outlet obstruction. Duodenum is normally positioned
as is the ligament of Treitz. No small bowel wall thickening. No
small bowel dilatation. The terminal ileum is normal. The appendix
is normal and well seen on sagittal image 39/9 and coronal images
45-56 of series 8. No gross colonic mass. No colonic wall
thickening.

Vascular/Lymphatic: There is moderate calcified aortic
atherosclerosis without aneurysm. There is no gastrohepatic or
hepatoduodenal ligament lymphadenopathy. No retroperitoneal or
mesenteric lymphadenopathy. No pelvic sidewall lymphadenopathy.

Reproductive: The uterus is surgically absent. There is no adnexal
mass.

Other: No intraperitoneal free fluid.

Musculoskeletal: No worrisome lytic or sclerotic osseous
abnormality.
IMPRESSION: 1. No acute findings in the abdomen or pelvis. Specifically, no
findings to explain the patient's history of right lower quadrant
pain. The appendix, terminal ileum, and right adnexal space are
unremarkable.
2. Stable appearance of the 6.6 cm exophytic vascular mass with
marked areas of fat density in the upper pole right kidney
consistent with angiomyolipoma.
3. Aortic Atherosclerosis (K81YX-PO5.5).

## 2022-10-16 ENCOUNTER — Ambulatory Visit (INDEPENDENT_AMBULATORY_CARE_PROVIDER_SITE_OTHER): Payer: Medicare Other | Admitting: Podiatry

## 2022-10-16 DIAGNOSIS — Q828 Other specified congenital malformations of skin: Secondary | ICD-10-CM

## 2022-10-16 NOTE — Progress Notes (Signed)
Subjective:  Patient ID: Danielle Dean, female    DOB: Dec 31, 1928,  MRN: [id number]  Chief Complaint  Patient presents with   Callouses    89 y.o. female presents with the above complaint.  Patient presents with bilateral submetatarsal 5 porokeratotic lesion painful to touch is progressive and worse worse with ambulation worse with pressure she is not diabetic she has not seen and was prior to seeing me.  Pain scale 7 out of 10 dull achy in nature she went to get it evaluated.   Review of Systems: Negative except as noted in the HPI. Denies N/V/F/Ch.  Past Medical History:  Diagnosis Date   Arthritis    Callus    Cataract fragments in both eyes following surgery    Corns and callosities    Hx of hysterectomy    Hypertension    Osteoarthritis     Current Outpatient Medications:    Acetaminophen (TYLENOL ARTHRITIS PAIN PO), Take 2 tablets by mouth 2 (two) times daily as needed., Disp: , Rfl:    amLODipine (NORVASC) 2.5 MG tablet, Take 2.5 mg by mouth daily. , Disp: , Rfl:    atenolol (TENORMIN) 50 MG tablet, Take 50 mg by mouth daily., Disp: , Rfl:    b complex vitamins tablet, Take 1 tablet by mouth once a week. (Patient not taking: Reported on 07/31/2022), Disp: , Rfl:    calcium carbonate (OS-CAL) 600 MG TABS, Take 600 mg by mouth daily., Disp: , Rfl:    cholecalciferol (VITAMIN D) 1000 UNITS tablet, Take 1,000 Units by mouth daily., Disp: , Rfl:    Cyanocobalamin (VITAMIN B-12 PO), Take by mouth every other day., Disp: , Rfl:    denosumab (PROLIA) 60 MG/ML SOSY injection, Inject 60 mg into the skin every 6 (six) months. Courier to rheum: [address], [address], [name] [address]. Appt on 07/28/2022, Disp: 1 mL, Rfl: 0   Diclofenac Sodium 2 % SOLN, Apply topically as needed., Disp: , Rfl:    DULoxetine (CYMBALTA) 20 MG capsule, Take 1 capsule (20 mg total) by mouth daily. (Patient not taking: Reported on 12/04/2021), Disp: 90 capsule, Rfl: 0   furosemide (LASIX) 20 MG  tablet, Take 10 mg by mouth daily., Disp: , Rfl:    lansoprazole (PREVACID) 30 MG capsule, Take 30 mg by mouth daily., Disp: , Rfl:    losartan (COZAAR) 100 MG tablet, Take 100 mg by mouth daily. , Disp: , Rfl:    sodium chloride 1 g tablet, Take 1 g by mouth 2 (two) times daily with a meal., Disp: , Rfl:   Social History   Tobacco Use  Smoking Status Never   Passive exposure: Never  Smokeless Tobacco Never    Allergies  Allergen Reactions   Actonel [Risedronate]    Fosamax [Alendronate]    Lisinopril Other (See Comments)    Cough    Objective:  There were no vitals filed for this visit. There is no height or weight on file to calculate BMI. Constitutional Well developed. Well nourished.  Vascular Dorsalis pedis pulses palpable bilaterally. Posterior tibial pulses palpable bilaterally. Capillary refill normal to all digits.  No cyanosis or clubbing noted. Pedal hair growth normal.  Neurologic Normal speech. Oriented to person, place, and time. Epicritic sensation to light touch grossly present bilaterally.  Dermatologic Bilateral submetatarsal 5 porokeratotic lesion with central nucleated core noted pain on palpation to the lesion.  No pain pinpoint bleeding noted upon debridement  Orthopedic: Normal joint ROM without pain or  crepitus bilaterally. No visible deformities. No bony tenderness.   Radiographs: None Assessment:   1. Porokeratosis    Plan:  Patient was evaluated and treated and all questions answered.  Bilateral submetatarsal 5 porokeratosis -All questions and concerns were discussed with the patient extensive detail given the amount of pain that she is experiencing she will benefit from treatment of the lesion using chisel and a negative disease was debrided down to healthy striated tissue.  No complication noted no pinpoint bleeding noted.  No follow-ups on file.

## 2022-10-20 ENCOUNTER — Telehealth: Payer: Self-pay

## 2022-10-20 ENCOUNTER — Telehealth: Payer: Self-pay | Admitting: *Deleted

## 2022-10-20 NOTE — Telephone Encounter (Signed)
Received DEXA results from [facility].  Date of Scan: 10/09/2022  Lowest T-score:-4.7  BMD:0.413  Lowest site measured:Left Radius  DX:   Significant changes in BMD and site measured (5% and above):5% Right Total Femur   Current Regimen:Calcium, Vitamin D, Prolia Started Jan. 2022, Last Injection 07/28/2022  Recommendation:Continue current treatment  Reviewed by:Dr. Pollyann Savoy   Next Appointment:  Due October 2024

## 2022-10-20 NOTE — Telephone Encounter (Signed)
I returned the phone call from patient's daughter Dr. Deeann Saint.  She stated that her mom is in chronic discomfort and would benefit from tramadol 50 mg half to 1 tablet a day.  I discussed with patient's daughter that the patient will require regular monitoring and urine drug screen.  It would be hard for [place] to see her frequently to get UDS.  I recommended that the family should  reach out to the PCP for the prescription as it may be more convenient for the patient.

## 2022-10-20 NOTE — Telephone Encounter (Signed)
A Doctor from [place] named Rashmi Dixit contacted the office in hopes to talk to Dr. Corliss Skains about the patient. Dr. Salem Caster did not mention what the call was about but did state that Dr. Corliss Skains would know. Advised her Dr. Corliss Skains is out of office today. Dr. Salem Caster states she is okay with waiting and getting a call back tomorrow. The call back number is [phone]. Please advise.

## 2022-12-31 ENCOUNTER — Other Ambulatory Visit (HOSPITAL_COMMUNITY): Payer: Self-pay

## 2022-12-31 ENCOUNTER — Telehealth: Payer: Self-pay | Admitting: Rheumatology

## 2022-12-31 NOTE — Telephone Encounter (Signed)
-----   Message from Nurse Richardo Priest sent at 12/31/2022 10:34 AM EDT ----- Please schedule patient an appointment for October 2024. Thanks!

## 2022-12-31 NOTE — Telephone Encounter (Signed)
LVM to schedule a follow up appt.

## 2023-01-12 ENCOUNTER — Other Ambulatory Visit: Payer: Self-pay

## 2023-01-26 NOTE — Progress Notes (Signed)
Office Visit Note  Patient: Danielle Dean             Date of Birth: Sep 24, 1928           MRN: [id number]             PCP: Georgann Housekeeper, MD Referring: Georgann Housekeeper, MD Visit Date: 02/04/2023 Occupation: @GUAROCC @  Subjective:  Pain in multiple joints and Prolia injection  History of Present Illness: Danielle Dean is a 89 y.o. female with osteoarthritis and osteoporosis.  She came today to get her Prolia injection.  She states she has been taking calcium and vitamin D on a regular basis.  She had Orthovisc injections in April 2024.  She states she did not notice remarkable difference between the Orthovisc injections or cortisone injections.  She states the injections do not last that long.  She continues to have pain and discomfort in her bilateral hands, knee joints in her feet.  She has chronic discomfort in her back.    Activities of Daily Living:  Patient reports morning stiffness for 0  none .   Patient Denies nocturnal pain.  Difficulty dressing/grooming: Denies Difficulty climbing stairs: Reports Difficulty getting out of chair: Reports Difficulty using hands for taps, buttons, cutlery, and/or writing: Reports  Review of Systems  Constitutional:  Negative for fatigue.  HENT:  Negative for mouth sores and mouth dryness.   Eyes:  Positive for dryness.  Respiratory:  Negative for shortness of breath.   Cardiovascular:  Negative for chest pain and palpitations.  Gastrointestinal:  Positive for constipation. Negative for blood in stool and diarrhea.  Endocrine: Negative for increased urination.  Genitourinary:  Negative for involuntary urination.  Musculoskeletal:  Positive for joint pain, gait problem, joint pain, joint swelling and muscle weakness. Negative for myalgias, morning stiffness, muscle tenderness and myalgias.  Skin:  Positive for sensitivity to sunlight. Negative for color change, rash and hair loss.  Allergic/Immunologic: Negative for susceptible to  infections.  Neurological:  Negative for dizziness and headaches.  Hematological:  Negative for swollen glands.  Psychiatric/Behavioral:  Negative for depressed mood and sleep disturbance. The patient is not nervous/anxious.     PMFS History:  Patient Active Problem List   Diagnosis Date Noted   DDD (degenerative disc disease), thoracic 11/06/2020   Other idiopathic scoliosis, thoracolumbar region 11/06/2020   DDD (degenerative disc disease), lumbar 11/06/2020   Age-related osteoporosis without current pathological fracture 11/06/2020   Porokeratosis 11/26/2018   Corns and callosities 11/26/2018   History of right shoulder replacement 10/07/2016   Chronic pain of both knees 05/20/2016   Osteoarthritis of both hands 05/20/2016   Essential hypertension 05/20/2016   Environmental allergies 05/20/2016   Primary osteoarthritis of both knees 05/19/2016   Callus of foot 11/24/2012   Pain in joint, ankle and foot 09/15/2012    Past Medical History:  Diagnosis Date   Arthritis    Callus    Cataract fragments in both eyes following surgery    Corns and callosities    Hx of hysterectomy    Hypertension    Osteoarthritis     Family History  Problem Relation Age of Onset   Arthritis Mother    Diabetes Father    Past Surgical History:  Procedure Laterality Date   ABDOMINAL HYSTERECTOMY     NASAL SEPTUM SURGERY     ROTATOR CUFF REPAIR Right    TOTAL SHOULDER ARTHROPLASTY     Social History   Social History Narrative   Not  on file   Immunization History  Administered Date(s) Administered   PFIZER(Purple Top)SARS-COV-2 Vaccination 07/06/2019, 07/27/2019, 03/10/2020     Objective: Vital Signs: BP (!) 162/69 (BP Location: Left Arm, Patient Position: Sitting, Cuff Size: Normal)   Pulse (!) 59   Resp 18   Wt 100 lb (45.4 kg)   BMI 19.53 kg/m    Physical Exam Vitals and nursing note reviewed.  Constitutional:      Appearance: She is well-developed.  HENT:     Head:  Normocephalic and atraumatic.  Eyes:     Conjunctiva/sclera: Conjunctivae normal.  Cardiovascular:     Rate and Rhythm: Normal rate and regular rhythm.     Heart sounds: Normal heart sounds.  Pulmonary:     Effort: Pulmonary effort is normal.     Breath sounds: Normal breath sounds.  Abdominal:     General: Bowel sounds are normal.     Palpations: Abdomen is soft.  Musculoskeletal:     Cervical back: Normal range of motion.     Right lower leg: Edema present.     Left lower leg: Edema present.  Lymphadenopathy:     Cervical: No cervical adenopathy.  Skin:    General: Skin is warm and dry.     Capillary Refill: Capillary refill takes less than 2 seconds.  Neurological:     Mental Status: She is alert and oriented to person, place, and time.  Psychiatric:        Behavior: Behavior normal.      Musculoskeletal Exam: She had limited lateral rotation of the cervical spine.  She had thoracic kyphosis and thoracolumbar scoliosis.  She had limited abduction of bilateral shoulders up to 110 degrees.  She had limited internal rotation of both shoulders more prominent on the left side.  Elbow joints and wrist joints in good range of motion.  Bilateral CMC subluxation was noted.  Bilateral PIP and DIP thickening and subluxation was noted.  Inflammation was noted in the right third and fourth PIP joints.  Hip joints were in good range of motion.  Knee joints had limited extension and discomfort with range of motion.  Effusion was noted in the right knee joint.  There was no tenderness over ankles or MTPs.  CDAI Exam: CDAI Score: -- Patient Global: --; Provider Global: -- Swollen: --; Tender: -- Joint Exam 02/04/2023   No joint exam has been documented for this visit   There is currently no information documented on the homunculus. Go to the Rheumatology activity and complete the homunculus joint exam.  Investigation: No additional findings.  Imaging: No results found.  Recent  Labs: Lab Results  Component Value Date   WBC 7.5 01/07/2022   HGB 11.2 (L) 01/07/2022   PLT 219 01/07/2022   NA 130 (L) 01/07/2022   K 5.1 01/07/2022   CL 98 01/07/2022   CO2 26 01/07/2022   GLUCOSE 92 01/07/2022   BUN 21 01/07/2022   CREATININE 0.69 01/07/2022   BILITOT 0.5 01/07/2022   ALKPHOS 50 12/20/2020   AST 15 01/07/2022   ALT 13 01/07/2022   PROT 6.3 01/07/2022   ALBUMIN 3.6 12/20/2020   CALCIUM 8.9 01/07/2022   GFRAA 81 06/14/2020     Speciality Comments: Prolia: 06/27/20, 01/16/21,07/18/2021,01/16/22  Labs in [employer] tab  Procedures:  No procedures performed Allergies: Actonel [risedronate], Fosamax [alendronate], and Lisinopril   Assessment / Plan:     Visit Diagnoses: Primary osteoarthritis of both knees - s/p orthovisc bilateral knees 09/2022.  Patient states she did not notice significant difference with Orthovisc injections to her knee joints.  She continues to have some warmth and discomfort in her knee joints.  She states even the cortisone injections do not help her much.    History of right shoulder replacement-she had limited abduction and internal rotation without much discomfort.  She continues to have discomfort in her left shoulder joint.  She had limited range of motion of her left shoulder joint and also limited internal rotation.  Rupture of left biceps tendon, sequela  Primary osteoarthritis of both hands-she had bilateral CMC subluxation, PIP and DIP subluxation with some inflammatory changes in the PIP joints.  Joint protection muscle strengthening was discussed.  Pain in right ankle and joints of right foot-she has intermittent discomfort in her foot.  No synovitis was noted.  DDD (degenerative disc disease), thoracic-she has chronic discomfort due to degenerative changes.  Other idiopathic scoliosis, thoracolumbar region  DDD (degenerative disc disease), lumbar-chronic pain.  Age-related osteoporosis without current pathological  fracture - DEXA 10/09/22: T-score: -4.7, BMD: 0.413 left radius. She has been on Prolia since January 2022.  Last Prolia was on July 28, 2022.  Will give her next pulley injection today.  She had labs drawn at her nephrologist office on January 28, 2023 iron studies normal, ferritin 64, vitamin D 52.2, CBC WBC 8.1, hemoglobin 11.4, platelets 222, CMP creatinine 0.69, GFR 81, sodium 133, calcium 9.4, AST 20 ALT 11  Pedal edema  History of hypertension-blood pressure was elevated at 162/69.  Patient states her blood pressure always states normal at home.  She has blood pressure monitor at home.  She was advised to monitor blood pressure closely and follow-up with her PCP.  Orders: No orders of the defined types were placed in this encounter.  Meds ordered this encounter  Medications   denosumab (PROLIA) injection 60 mg    Order Specific Question:   Patient is enrolled in [facility] program for this medication and I have provided a copy of the Prolia Medication Guide and Patient Brochure.    Answer:   Yes    Order Specific Question:   I have reviewed with the patient the information in the Prolia Medication Guide and Patient Counseling Chart including the serious risks of Prolia and symptoms of each risk.    Answer:   Yes    Order Specific Question:   I have advised the patient to seek medical attention if they have signs or symptoms of any of the serious risks.    Answer:   Yes     Follow-Up Instructions: Return in about 6 months (around 08/07/2023) for Osteoporosis.   Pollyann Savoy, MD  Note - This record has been created using Animal nutritionist.  Chart creation errors have been sought, but may not always  have been located. Such creation errors do not reflect on  the standard of medical care.

## 2023-01-27 ENCOUNTER — Telehealth: Payer: Self-pay | Admitting: Pharmacist

## 2023-01-27 ENCOUNTER — Other Ambulatory Visit: Payer: Self-pay

## 2023-01-27 ENCOUNTER — Other Ambulatory Visit (HOSPITAL_COMMUNITY): Payer: Self-pay

## 2023-01-27 DIAGNOSIS — M810 Age-related osteoporosis without current pathological fracture: Secondary | ICD-10-CM

## 2023-01-27 DIAGNOSIS — Z5181 Encounter for therapeutic drug level monitoring: Secondary | ICD-10-CM

## 2023-01-27 MED ORDER — DENOSUMAB 60 MG/ML ~~LOC~~ SOSY
60.0000 mg | PREFILLED_SYRINGE | SUBCUTANEOUS | 0 refills | Status: DC
Start: 2023-01-27 — End: 2023-07-19
  Filled 2023-01-27: qty 1, 180d supply, fill #0

## 2023-01-27 NOTE — Telephone Encounter (Signed)
Patient due for next Prolia on 01/24/2023. Per test claim, copay for Prolia is $4.60. Rx sent to [facility] today for med to be couriered to clinic before appt.  She has f/u appt on 02/04/2023.  ATC patient regarding labs that are needed prior to appt. Future orders for CBC, CMP, and Vitamin D placed  Chesley Mires, PharmD, MPH, BCPS, CPP Clinical Pharmacist (Rheumatology and Pulmonology)

## 2023-01-28 NOTE — Telephone Encounter (Signed)
Patient returned call - they will try to have patient's CBC, CMP, Vitamin D labs drawn at [facility] this afternoon  Chesley Mires, PharmD, MPH, BCPS, CPP Clinical Pharmacist (Rheumatology and Pulmonology)

## 2023-01-28 NOTE — Telephone Encounter (Signed)
Patient's son returned call - he will coordinate with his sister and/or patient's caregiver to have labwork completed by Monday, 02/02/23  Chesley Mires, PharmD, MPH, BCPS, CPP Clinical Pharmacist (Rheumatology and Pulmonology)

## 2023-01-29 NOTE — Telephone Encounter (Signed)
[facility]rol[facility] received [facility] - placed in fridge

## 2023-02-04 ENCOUNTER — Ambulatory Visit: Payer: Medicare Other | Attending: Rheumatology | Admitting: Rheumatology

## 2023-02-04 ENCOUNTER — Encounter: Payer: Self-pay | Admitting: Rheumatology

## 2023-02-04 VITALS — BP 162/69 | HR 59 | Resp 18 | Wt 100.0 lb

## 2023-02-04 DIAGNOSIS — M25571 Pain in right ankle and joints of right foot: Secondary | ICD-10-CM | POA: Diagnosis present

## 2023-02-04 DIAGNOSIS — M170 Bilateral primary osteoarthritis of knee: Secondary | ICD-10-CM | POA: Diagnosis present

## 2023-02-04 DIAGNOSIS — R600 Localized edema: Secondary | ICD-10-CM | POA: Insufficient documentation

## 2023-02-04 DIAGNOSIS — M5134 Other intervertebral disc degeneration, thoracic region: Secondary | ICD-10-CM | POA: Diagnosis present

## 2023-02-04 DIAGNOSIS — M19042 Primary osteoarthritis, left hand: Secondary | ICD-10-CM | POA: Diagnosis present

## 2023-02-04 DIAGNOSIS — M4125 Other idiopathic scoliosis, thoracolumbar region: Secondary | ICD-10-CM | POA: Insufficient documentation

## 2023-02-04 DIAGNOSIS — M810 Age-related osteoporosis without current pathological fracture: Secondary | ICD-10-CM | POA: Diagnosis present

## 2023-02-04 DIAGNOSIS — M19041 Primary osteoarthritis, right hand: Secondary | ICD-10-CM | POA: Diagnosis present

## 2023-02-04 DIAGNOSIS — S46212S Strain of muscle, fascia and tendon of other parts of biceps, left arm, sequela: Secondary | ICD-10-CM | POA: Insufficient documentation

## 2023-02-04 DIAGNOSIS — Z96611 Presence of right artificial shoulder joint: Secondary | ICD-10-CM | POA: Diagnosis present

## 2023-02-04 DIAGNOSIS — M5136 Other intervertebral disc degeneration, lumbar region: Secondary | ICD-10-CM | POA: Diagnosis present

## 2023-02-04 DIAGNOSIS — Z8679 Personal history of other diseases of the circulatory system: Secondary | ICD-10-CM | POA: Diagnosis present

## 2023-02-04 MED ORDER — DENOSUMAB 60 MG/ML ~~LOC~~ SOSY
60.0000 mg | PREFILLED_SYRINGE | Freq: Once | SUBCUTANEOUS | Status: AC
Start: 2023-02-04 — End: 2023-02-04
  Administered 2023-02-04: 60 mg via SUBCUTANEOUS

## 2023-02-04 NOTE — Progress Notes (Signed)
Subjective:   Patient presents to clinic today to receive bi-annual dose of Prolia.  Patient running a fever or have signs/symptoms of infection? No  Patient currently on antibiotics for the treatment of infection? No  Patient had fall in the last 6 months?  No  If yes, did it require medical attention? No   Patient taking calcium 1200 mg daily through diet or supplement and at least 800 units vitamin D? Yes  Objective: CMP     Component Value Date/Time   NA 130 (L) 01/07/2022 1109   K 5.1 01/07/2022 1109   CL 98 01/07/2022 1109   CO2 26 01/07/2022 1109   GLUCOSE 92 01/07/2022 1109   BUN 21 01/07/2022 1109   CREATININE 0.69 01/07/2022 1109   CALCIUM 8.9 01/07/2022 1109   PROT 6.3 01/07/2022 1109   ALBUMIN 3.6 12/20/2020 1422   AST 15 01/07/2022 1109   ALT 13 01/07/2022 1109   ALKPHOS 50 12/20/2020 1422   BILITOT 0.5 01/07/2022 1109   GFRNONAA >60 12/20/2020 1422   GFRNONAA 70 06/14/2020 0000   GFRAA 81 06/14/2020 0000    CBC    Component Value Date/Time   WBC 7.5 01/07/2022 1109   RBC 3.67 (L) 01/07/2022 1109   HGB 11.2 (L) 01/07/2022 1109   HCT 33.7 (L) 01/07/2022 1109   PLT 219 01/07/2022 1109   MCV 91.8 01/07/2022 1109   MCH 30.5 01/07/2022 1109   MCHC 33.2 01/07/2022 1109   RDW 11.8 01/07/2022 1109   LYMPHSABS 1,778 01/07/2022 1109   MONOABS 0.8 12/20/2020 1422   EOSABS 128 01/07/2022 1109   BASOSABS 38 01/07/2022 1109    Lab Results  Component Value Date   VD25OH 55 07/10/2021    T-score: -4.7   Assessment/Plan:   Administrations This Visit     denosumab (PROLIA) injection 60 mg     Admin Date 02/04/2023 Action Given Dose 60 mg Route Subcutaneous Documented By Henriette Combs, LPN             Patient tolerated injection well.   Patient is to return in 10-14 days for labs to monitor for hypocalcemia.  Future orders placed.   All questions encouraged and answered.  Instructed patient to call with any further questions or  concerns.

## 2023-02-04 NOTE — Patient Instructions (Signed)
Please continue to take vitamin D.  Calcium intake should be 1200 mg daily between dietary and supplement.

## 2023-03-13 ENCOUNTER — Ambulatory Visit: Payer: Medicare Other | Admitting: Podiatry

## 2023-03-20 ENCOUNTER — Encounter: Payer: Self-pay | Admitting: Podiatry

## 2023-03-20 ENCOUNTER — Ambulatory Visit (INDEPENDENT_AMBULATORY_CARE_PROVIDER_SITE_OTHER): Payer: Medicare Other | Admitting: Podiatry

## 2023-03-20 DIAGNOSIS — R053 Chronic cough: Secondary | ICD-10-CM | POA: Insufficient documentation

## 2023-03-20 DIAGNOSIS — I700 Atherosclerosis of aorta: Secondary | ICD-10-CM | POA: Insufficient documentation

## 2023-03-20 DIAGNOSIS — R54 Age-related physical debility: Secondary | ICD-10-CM | POA: Insufficient documentation

## 2023-03-20 DIAGNOSIS — M519 Unspecified thoracic, thoracolumbar and lumbosacral intervertebral disc disorder: Secondary | ICD-10-CM | POA: Insufficient documentation

## 2023-03-20 DIAGNOSIS — E7800 Pure hypercholesterolemia, unspecified: Secondary | ICD-10-CM | POA: Insufficient documentation

## 2023-03-20 DIAGNOSIS — M8580 Other specified disorders of bone density and structure, unspecified site: Secondary | ICD-10-CM | POA: Insufficient documentation

## 2023-03-20 DIAGNOSIS — M179 Osteoarthritis of knee, unspecified: Secondary | ICD-10-CM | POA: Insufficient documentation

## 2023-03-20 DIAGNOSIS — Q828 Other specified congenital malformations of skin: Secondary | ICD-10-CM

## 2023-03-20 DIAGNOSIS — E871 Hypo-osmolality and hyponatremia: Secondary | ICD-10-CM | POA: Insufficient documentation

## 2023-03-20 DIAGNOSIS — L299 Pruritus, unspecified: Secondary | ICD-10-CM | POA: Insufficient documentation

## 2023-03-20 DIAGNOSIS — J309 Allergic rhinitis, unspecified: Secondary | ICD-10-CM | POA: Insufficient documentation

## 2023-03-20 DIAGNOSIS — K219 Gastro-esophageal reflux disease without esophagitis: Secondary | ICD-10-CM | POA: Insufficient documentation

## 2023-03-20 DIAGNOSIS — R911 Solitary pulmonary nodule: Secondary | ICD-10-CM | POA: Insufficient documentation

## 2023-03-20 NOTE — Progress Notes (Signed)
Subjective:  Patient ID: Danielle Dean, female    DOB: 29-Nov-1928,  MRN: [id number]  Chief Complaint  Patient presents with   Callouses    89 y.o. female presents with the above complaint.  Patient presents for follow-up of bilateral submetatarsal 5 porokeratotic lesion.  She states this started coming back again.  No surgical plans at this time she wants to debride down   Review of Systems: Negative except as noted in the HPI. Denies N/V/F/Ch.  Past Medical History:  Diagnosis Date   Arthritis    Callus    Cataract fragments in both eyes following surgery    Corns and callosities    Hx of hysterectomy    Hypertension    Osteoarthritis     Current Outpatient Medications:    Acetaminophen (TYLENOL ARTHRITIS PAIN PO), Take 2 tablets by mouth 2 (two) times daily as needed., Disp: , Rfl:    amLODipine (NORVASC) 2.5 MG tablet, Take 2.5 mg by mouth daily. , Disp: , Rfl:    atenolol (TENORMIN) 50 MG tablet, Take 50 mg by mouth daily., Disp: , Rfl:    b complex vitamins tablet, Take 1 tablet by mouth once a week., Disp: , Rfl:    calcium carbonate (OS-CAL) 600 MG TABS, Take 600 mg by mouth daily., Disp: , Rfl:    cholecalciferol (VITAMIN D) 1000 UNITS tablet, Take 1,000 Units by mouth daily., Disp: , Rfl:    Cyanocobalamin (VITAMIN B-12 PO), Take by mouth every other day., Disp: , Rfl:    denosumab (PROLIA) 60 MG/ML SOSY injection, Inject 60 mg into the skin every 6 (six) months. Courier to rheum: [address], [name] [address]. Appt on 02/04/23, Disp: 1 mL, Rfl: 0   Diclofenac Sodium 2 % SOLN, Apply topically as needed., Disp: , Rfl:    DULoxetine (CYMBALTA) 20 MG capsule, Take 1 capsule (20 mg total) by mouth daily. (Patient not taking: Reported on 02/04/2023), Disp: 90 capsule, Rfl: 0   furosemide (LASIX) 20 MG tablet, Take 10 mg by mouth daily., Disp: , Rfl:    Iron, Ferrous Sulfate, 325 (65 Fe) MG TABS, 1 tablet Orally Three times a Week, Disp: , Rfl:     lansoprazole (PREVACID) 30 MG capsule, Take 30 mg by mouth daily., Disp: , Rfl:    losartan (COZAAR) 100 MG tablet, Take 100 mg by mouth daily. , Disp: , Rfl:    sodium chloride 1 g tablet, Take 1 g by mouth 2 (two) times daily with a meal., Disp: , Rfl:    traMADol (ULTRAM) 50 MG tablet, Take 50 mg by mouth daily as needed., Disp: , Rfl:   Social History   Tobacco Use  Smoking Status Never   Passive exposure: Never  Smokeless Tobacco Never    Allergies  Allergen Reactions   Actonel [Risedronate]    Fosamax [Alendronate]    Lisinopril Other (See Comments)    Cough    Objective:  There were no vitals filed for this visit. There is no height or weight on file to calculate BMI. Constitutional Well developed. Well nourished.  Vascular Dorsalis pedis pulses palpable bilaterally. Posterior tibial pulses palpable bilaterally. Capillary refill normal to all digits.  No cyanosis or clubbing noted. Pedal hair growth normal.  Neurologic Normal speech. Oriented to person, place, and time. Epicritic sensation to light touch grossly present bilaterally.  Dermatologic Bilateral submetatarsal 5 porokeratotic lesion with central nucleated core noted pain on palpation to the lesion.  No pain pinpoint  bleeding noted upon debridement  Orthopedic: Normal joint ROM without pain or crepitus bilaterally. No visible deformities. No bony tenderness.   Radiographs: None Assessment:   No diagnosis found.  Plan:  Patient was evaluated and treated and all questions answered.  Bilateral submetatarsal 5 porokeratosis -All questions and concerns were discussed with the patient extensive detail given the amount of pain that she is experiencing she will benefit from treatment of the lesion using chisel and a negative disease was debrided down to healthy striated tissue.  No complication noted no pinpoint bleeding noted.  No follow-ups on file.

## 2023-03-30 ENCOUNTER — Ambulatory Visit: Payer: Medicare Other | Admitting: Rheumatology

## 2023-04-21 ENCOUNTER — Ambulatory Visit: Payer: Medicare Other | Attending: Rheumatology | Admitting: Rheumatology

## 2023-04-21 ENCOUNTER — Encounter: Payer: Self-pay | Admitting: Rheumatology

## 2023-04-21 VITALS — BP 152/78 | HR 65 | Resp 13 | Ht 59.0 in | Wt 102.0 lb

## 2023-04-21 DIAGNOSIS — E871 Hypo-osmolality and hyponatremia: Secondary | ICD-10-CM | POA: Insufficient documentation

## 2023-04-21 DIAGNOSIS — G8929 Other chronic pain: Secondary | ICD-10-CM | POA: Diagnosis present

## 2023-04-21 DIAGNOSIS — M19041 Primary osteoarthritis, right hand: Secondary | ICD-10-CM | POA: Diagnosis not present

## 2023-04-21 DIAGNOSIS — M25512 Pain in left shoulder: Secondary | ICD-10-CM | POA: Diagnosis present

## 2023-04-21 DIAGNOSIS — Z96611 Presence of right artificial shoulder joint: Secondary | ICD-10-CM | POA: Diagnosis present

## 2023-04-21 DIAGNOSIS — M19042 Primary osteoarthritis, left hand: Secondary | ICD-10-CM | POA: Insufficient documentation

## 2023-04-21 DIAGNOSIS — Z8679 Personal history of other diseases of the circulatory system: Secondary | ICD-10-CM | POA: Insufficient documentation

## 2023-04-21 DIAGNOSIS — M170 Bilateral primary osteoarthritis of knee: Secondary | ICD-10-CM | POA: Diagnosis not present

## 2023-04-21 DIAGNOSIS — R2689 Other abnormalities of gait and mobility: Secondary | ICD-10-CM | POA: Diagnosis present

## 2023-04-21 DIAGNOSIS — M810 Age-related osteoporosis without current pathological fracture: Secondary | ICD-10-CM | POA: Insufficient documentation

## 2023-04-21 DIAGNOSIS — M4125 Other idiopathic scoliosis, thoracolumbar region: Secondary | ICD-10-CM | POA: Insufficient documentation

## 2023-04-21 DIAGNOSIS — M5134 Other intervertebral disc degeneration, thoracic region: Secondary | ICD-10-CM | POA: Diagnosis present

## 2023-04-21 DIAGNOSIS — M51369 Other intervertebral disc degeneration, lumbar region without mention of lumbar back pain or lower extremity pain: Secondary | ICD-10-CM | POA: Diagnosis present

## 2023-04-21 MED ORDER — LIDOCAINE HCL 1 % IJ SOLN
1.0000 mL | INTRAMUSCULAR | Status: AC | PRN
Start: 2023-04-21 — End: 2023-04-21
  Administered 2023-04-21: 1 mL

## 2023-04-21 MED ORDER — TRIAMCINOLONE ACETONIDE 40 MG/ML IJ SUSP
40.0000 mg | INTRAMUSCULAR | Status: AC | PRN
  Administered 2023-04-21: 40 mg via INTRA_ARTICULAR

## 2023-04-21 NOTE — Progress Notes (Signed)
Office Visit Note  Patient: Danielle Dean             Date of Birth: Jun 03, 1929           MRN: [id number]             PCP: Georgann Housekeeper, MD Referring: Georgann Housekeeper, MD Visit Date: 04/21/2023 Occupation: @GUAROCC @  Subjective:  Left shoulder and left knee pain  History of Present Illness: Danielle Dean is a 89 y.o. female with osteoarthritis and osteoporosis.  She is accompanied by her son today.  She came today on an urgent basis due to increased pain and discomfort in her left shoulder.  She has difficulty raising her left arm.  She also has been experiencing discomfort in her left knee joint.  She had an inadequate response to Visco supplements and cortisone injection in the past.  None of the other joints are painful.    Activities of Daily Living:  Patient reports morning stiffness for a few minutes.   Patient Denies nocturnal pain.  Difficulty dressing/grooming: Reports Difficulty climbing stairs: Reports Difficulty getting out of chair: Reports Difficulty using hands for taps, buttons, cutlery, and/or writing: Reports  Review of Systems  Constitutional:  Negative for fatigue.  HENT:  Negative for mouth sores and mouth dryness.   Eyes:  Positive for dryness.  Respiratory:  Negative for shortness of breath.   Cardiovascular:  Negative for chest pain and palpitations.  Gastrointestinal:  Negative for blood in stool, constipation and diarrhea.  Endocrine: Negative for increased urination.  Genitourinary:  Negative for involuntary urination.  Musculoskeletal:  Positive for joint pain, joint pain, myalgias and myalgias. Negative for joint swelling, muscle weakness, morning stiffness and muscle tenderness.  Skin:  Positive for sensitivity to sunlight. Negative for color change, rash and hair loss.  Allergic/Immunologic: Negative for susceptible to infections.  Neurological:  Positive for dizziness and parasthesias. Negative for headaches.  Hematological:  Negative for  swollen glands.  Psychiatric/Behavioral:  Negative for depressed mood and sleep disturbance. The patient is not nervous/anxious.     PMFS History:  Patient Active Problem List   Diagnosis Date Noted   Age-related physical debility 03/20/2023   Allergic rhinitis 03/20/2023   Chronic cough 03/20/2023   Chronic hyponatremia 03/20/2023   Gastro-esophageal reflux disease without esophagitis 03/20/2023   Hardening of the aorta (main artery of the heart) (HCC) 03/20/2023   Osteoarthritis of knee 03/20/2023   Itching 03/20/2023   Osteopenia 03/20/2023   Pure hypercholesterolemia 03/20/2023   Solitary pulmonary nodule 03/20/2023   Unspecified thoracic, thoracolumbar and lumbosacral intervertebral disc disorder 03/20/2023   DDD (degenerative disc disease), thoracic 11/06/2020   Other idiopathic scoliosis, thoracolumbar region 11/06/2020   DDD (degenerative disc disease), lumbar 11/06/2020   Age-related osteoporosis without current pathological fracture 11/06/2020   Porokeratosis 11/26/2018   Corns and callosities 11/26/2018   History of right shoulder replacement 10/07/2016   Chronic pain of both knees 05/20/2016   Osteoarthritis of both hands 05/20/2016   Essential hypertension 05/20/2016   Environmental allergies 05/20/2016   Primary osteoarthritis of both knees 05/19/2016   Callus of foot 11/24/2012   Pain in joint, ankle and foot 09/15/2012    Past Medical History:  Diagnosis Date   Arthritis    Callus    Cataract fragments in both eyes following surgery    Corns and callosities    Hx of hysterectomy    Hypertension    Osteoarthritis     Family History  Problem Relation  Age of Onset   Arthritis Mother    Diabetes Father    Past Surgical History:  Procedure Laterality Date   ABDOMINAL HYSTERECTOMY     NASAL SEPTUM SURGERY     ROTATOR CUFF REPAIR Right    TOTAL SHOULDER ARTHROPLASTY     Social History   Social History Narrative   Not on file   Immunization History   Administered Date(s) Administered   PFIZER(Purple Top)SARS-COV-2 Vaccination 07/06/2019, 07/27/2019, 03/10/2020     Objective: Vital Signs: BP (!) 152/78 (BP Location: Left Arm, Patient Position: Supine, Cuff Size: Normal)   Pulse 65   Resp 13   Ht 4\' 11"  (1.499 m)   Wt 102 lb (46.3 kg) Comment: per patient  BMI 20.60 kg/m    Physical Exam Vitals and nursing note reviewed.  Constitutional:      Appearance: She is well-developed.  HENT:     Head: Normocephalic and atraumatic.  Eyes:     Conjunctiva/sclera: Conjunctivae normal.  Cardiovascular:     Rate and Rhythm: Normal rate and regular rhythm.     Heart sounds: Normal heart sounds.  Pulmonary:     Effort: Pulmonary effort is normal.     Breath sounds: Normal breath sounds.  Abdominal:     General: Bowel sounds are normal.     Palpations: Abdomen is soft.  Musculoskeletal:     Cervical back: Normal range of motion.  Lymphadenopathy:     Cervical: No cervical adenopathy.  Skin:    General: Skin is warm and dry.     Capillary Refill: Capillary refill takes less than 2 seconds.  Neurological:     Mental Status: She is alert and oriented to person, place, and time.  Psychiatric:        Behavior: Behavior normal.      Musculoskeletal Exam: She had limited lateral rotation of the cervical spine.  Thoracic kyphosis was noted.  There was no point tenderness over thoracic or lumbar spine.  She had limited abduction of bilateral shoulders.  Her left shoulder joint abduction was limited to 40 degrees and painful.  Elbow joints and wrist joints in good range of motion.  Bilateral CMC PIP and DIP thickening with subluxation of several of the DIP joints was noted.  Hip joints were in good range of motion.  Limited extension of bilateral knee joints without any effusion was noted.  There was no tenderness over ankles or MTPs.  CDAI Exam: CDAI Score: -- Patient Global: --; Provider Global: -- Swollen: --; Tender: -- Joint Exam  04/21/2023   No joint exam has been documented for this visit   There is currently no information documented on the homunculus. Go to the Rheumatology activity and complete the homunculus joint exam.  Investigation: No additional findings.  Imaging: No results found.  Recent Labs: Lab Results  Component Value Date   WBC 7.5 01/07/2022   HGB 11.2 (L) 01/07/2022   PLT 219 01/07/2022   NA 130 (L) 01/07/2022   K 5.1 01/07/2022   CL 98 01/07/2022   CO2 26 01/07/2022   GLUCOSE 92 01/07/2022   BUN 21 01/07/2022   CREATININE 0.69 01/07/2022   BILITOT 0.5 01/07/2022   ALKPHOS 50 12/20/2020   AST 15 01/07/2022   ALT 13 01/07/2022   PROT 6.3 01/07/2022   ALBUMIN 3.6 12/20/2020   CALCIUM 8.9 01/07/2022   GFRAA 81 06/14/2020    Speciality Comments: Prolia: 06/27/20, 01/16/21,07/18/2021,01/16/22  Labs in [employer] tab  Procedures:  [employer]  Joint Inj: R subacromial bursa on 04/21/2023 4:32 PM Indications: pain Details: 27 G 1.5 in needle, posterior approach  Arthrogram: No  Medications: 1 mL lidocaine 1 %; 40 mg triamcinolone acetonide 40 MG/ML Aspirate: 0 mL Outcome: tolerated well, no immediate complications Procedure, treatment alternatives, risks and benefits explained, specific risks discussed. Consent was given by the patient. Immediately prior to procedure a time out was called to verify the correct patient, procedure, equipment, support staff and site/side marked as required. Patient was prepped and draped in the usual sterile fashion.     Allergies: Actonel [risedronate], Fosamax [alendronate], and Lisinopril   Assessment / Plan:     Visit Diagnoses: Chronic left shoulder pain-patient has known osteoarthritis of the left shoulder joint.  She has been having progressively increasing difficulty raising her left arm.  He had tenderness over left subacromial region.  She requested a cortisone injection.  After informed consent was obtained and side effects were discussed left  subacromial region was injected with lidocaine and Kenalog as described above.  She tolerated the procedure well.  Postprocedure instructions were given.  History of right shoulder replacement-she denies any discomfort.  She has limited abduction and internal rotation.  Primary osteoarthritis of both hands-severe osteoarthritis in bilateral hands with CMC PIP and DIP subluxation.  Joint protection was discussed.  Primary osteoarthritis of both knees-she has been having increased pain and discomfort in the left knee joint.  Previous x-rays showed osteoarthritis.  Patient requested a cortisone injection in her left knee.  I will hold off cortisone injection because her blood pressure was elevated.  Patient will come back for injection when her blood pressure is better.  DDD (degenerative disc disease), thoracic-she denies any discomfort today.,  There was no point tenderness.  Other idiopathic scoliosis, thoracolumbar region  Degeneration of intervertebral disc of lumbar region without discogenic back pain or lower extremity pain-she has intermittent discomfort.  Age-related osteoporosis without current pathological fracture-DEXA 10/09/22: T-score: -4.7, BMD: 0.413 left radius. She has been on Prolia since January 2022.  Last Prolia was on July 28, 2022.   History of hypertension-blood pressure was elevated 166/67.  After testing blood pressure came down to 152/78.  She was advised to monitor blood pressure closely.  Hyponatremia-she is on salt tablets.  Poor balance-she had this with the help of a walker.  Orders: Orders Placed This Encounter  Procedures   Large Joint Inj   No orders of the defined types were placed in this encounter.    Follow-Up Instructions: Return for Osteoarthritis.   Pollyann Savoy, MD  Note - This record has been created using Animal nutritionist.  Chart creation errors have been sought, but may not always  have been located. Such creation errors do not  reflect on  the standard of medical care.

## 2023-07-13 ENCOUNTER — Other Ambulatory Visit: Payer: Self-pay

## 2023-07-19 ENCOUNTER — Other Ambulatory Visit: Payer: Self-pay | Admitting: Rheumatology

## 2023-07-19 DIAGNOSIS — M810 Age-related osteoporosis without current pathological fracture: Secondary | ICD-10-CM

## 2023-07-20 ENCOUNTER — Other Ambulatory Visit: Payer: Self-pay

## 2023-07-20 ENCOUNTER — Other Ambulatory Visit (HOSPITAL_COMMUNITY): Payer: Self-pay

## 2023-07-20 MED ORDER — PROLIA 60 MG/ML ~~LOC~~ SOSY
60.0000 mg | PREFILLED_SYRINGE | SUBCUTANEOUS | 0 refills | Status: DC
  Filled 2023-07-20: qty 1, 180d supply, fill #0

## 2023-07-20 NOTE — Progress Notes (Signed)
Specialty Pharmacy Refill Coordination Note  Danielle Dean is a 89 y.o. female assessed today regarding refills of clinic administered specialty medication(s) Denosumab (Prolia)   Clinic requested Courier to Provider Office   Delivery date: 08/11/23   Verified address: [facility] [address], [name] [address]   Medication will be filled on 08/10/23.

## 2023-07-20 NOTE — Telephone Encounter (Signed)
Last Fill: 01/27/2023  Labs: January 28, 2023 iron studies normal, ferritin 64, vitamin D 52.2, CBC WBC 8.1, hemoglobin 11.4, platelets 222, CMP creatinine 0.69, GFR 81, sodium 133, calcium 9.4, AST 20 ALT 11   Next Visit: 08/14/2023  Last Visit: 02/04/2023  DX: Age-related osteoporosis without current pathological fracture   Current Dose per office note 02/04/2023: She has been on Prolia since January 2022. Last injection 02/04/2023  Patient advised she is due for labs. Patient states she will speak with her son to see when he can bring her but will come this week or next.   Okay to refill Prolia?

## 2023-07-27 ENCOUNTER — Other Ambulatory Visit: Payer: Self-pay | Admitting: *Deleted

## 2023-07-27 DIAGNOSIS — M810 Age-related osteoporosis without current pathological fracture: Secondary | ICD-10-CM

## 2023-07-27 DIAGNOSIS — Z5181 Encounter for therapeutic drug level monitoring: Secondary | ICD-10-CM

## 2023-07-28 LAB — CBC WITH DIFFERENTIAL/PLATELET
Absolute Lymphocytes: 1399 {cells}/uL (ref 850–3900)
Absolute Monocytes: 632 {cells}/uL (ref 200–950)
Basophils Absolute: 32 {cells}/uL (ref 0–200)
Basophils Relative: 0.6 %
Eosinophils Absolute: 130 {cells}/uL (ref 15–500)
Eosinophils Relative: 2.4 %
HCT: 33.6 % — ABNORMAL LOW (ref 35.0–45.0)
Hemoglobin: 11.3 g/dL — ABNORMAL LOW (ref 11.7–15.5)
MCH: 30.2 pg (ref 27.0–33.0)
MCHC: 33.6 g/dL (ref 32.0–36.0)
MCV: 89.8 fL (ref 80.0–100.0)
MPV: 10 fL (ref 7.5–12.5)
Monocytes Relative: 11.7 %
Neutro Abs: 3208 {cells}/uL (ref 1500–7800)
Neutrophils Relative %: 59.4 %
Platelets: 202 10*3/uL (ref 140–400)
RBC: 3.74 10*6/uL — ABNORMAL LOW (ref 3.80–5.10)
RDW: 11.7 % (ref 11.0–15.0)
Total Lymphocyte: 25.9 %
WBC: 5.4 10*3/uL (ref 3.8–10.8)

## 2023-07-28 LAB — COMPREHENSIVE METABOLIC PANEL
AG Ratio: 1.7 (calc) (ref 1.0–2.5)
ALT: 13 U/L (ref 6–29)
AST: 19 U/L (ref 10–35)
Albumin: 3.8 g/dL (ref 3.6–5.1)
Alkaline phosphatase (APISO): 69 U/L (ref 37–153)
BUN: 17 mg/dL (ref 7–25)
CO2: 25 mmol/L (ref 20–32)
Calcium: 8.9 mg/dL (ref 8.6–10.4)
Chloride: 102 mmol/L (ref 98–110)
Creat: 0.65 mg/dL (ref 0.60–0.95)
Globulin: 2.2 g/dL (ref 1.9–3.7)
Glucose, Bld: 112 mg/dL — ABNORMAL HIGH (ref 65–99)
Potassium: 4.1 mmol/L (ref 3.5–5.3)
Sodium: 135 mmol/L (ref 135–146)
Total Bilirubin: 0.4 mg/dL (ref 0.2–1.2)
Total Protein: 6 g/dL — ABNORMAL LOW (ref 6.1–8.1)

## 2023-07-28 LAB — VITAMIN D 25 HYDROXY (VIT D DEFICIENCY, FRACTURES): Vit D, 25-Hydroxy: 56 ng/mL (ref 30–100)

## 2023-07-28 NOTE — Progress Notes (Signed)
CMP is normal.  CBC shows low hemoglobin which is stable.  Vitamin D is in the desirable range.  Please forward results to her PCP.

## 2023-07-31 NOTE — Progress Notes (Deleted)
 Office Visit Note  Patient: Danielle Dean             Date of Birth: 08/22/1928           MRN: [id number]             PCP: Georgann Housekeeper, MD Referring: Georgann Housekeeper, MD Visit Date: 08/14/2023 Occupation: @GUAROCC @  Subjective:  No chief complaint on file.   History of Present Illness: Danielle Dean is a 89 y.o. female ***     Activities of Daily Living:  Patient reports morning stiffness for *** {minute/hour:19697}.   Patient {ACTIONS;DENIES/REPORTS:21021675::"Denies"} nocturnal pain.  Difficulty dressing/grooming: {ACTIONS;DENIES/REPORTS:21021675::"Denies"} Difficulty climbing stairs: {ACTIONS;DENIES/REPORTS:21021675::"Denies"} Difficulty getting out of chair: {ACTIONS;DENIES/REPORTS:21021675::"Denies"} Difficulty using hands for taps, buttons, cutlery, and/or writing: {ACTIONS;DENIES/REPORTS:21021675::"Denies"}  No Rheumatology ROS completed.   PMFS History:  Patient Active Problem List   Diagnosis Date Noted  . Age-related physical debility 03/20/2023  . Allergic rhinitis 03/20/2023  . Chronic cough 03/20/2023  . Chronic hyponatremia 03/20/2023  . Gastro-esophageal reflux disease without esophagitis 03/20/2023  . Hardening of the aorta (main artery of the heart) (HCC) 03/20/2023  . Osteoarthritis of knee 03/20/2023  . Itching 03/20/2023  . Osteopenia 03/20/2023  . Pure hypercholesterolemia 03/20/2023  . Solitary pulmonary nodule 03/20/2023  . Unspecified thoracic, thoracolumbar and lumbosacral intervertebral disc disorder 03/20/2023  . DDD (degenerative disc disease), thoracic 11/06/2020  . Other idiopathic scoliosis, thoracolumbar region 11/06/2020  . DDD (degenerative disc disease), lumbar 11/06/2020  . Age-related osteoporosis without current pathological fracture 11/06/2020  . Porokeratosis 11/26/2018  . Corns and callosities 11/26/2018  . History of right shoulder replacement 10/07/2016  . Chronic pain of both knees 05/20/2016  . Osteoarthritis of  both hands 05/20/2016  . Essential hypertension 05/20/2016  . Environmental allergies 05/20/2016  . Primary osteoarthritis of both knees 05/19/2016  . Callus of foot 11/24/2012  . Pain in joint, ankle and foot 09/15/2012    Past Medical History:  Diagnosis Date  . Arthritis   . Callus   . Cataract fragments in both eyes following surgery   . Corns and callosities   . Hx of hysterectomy   . Hypertension   . Osteoarthritis     Family History  Problem Relation Age of Onset  . Arthritis Mother   . Diabetes Father    Past Surgical History:  Procedure Laterality Date  . ABDOMINAL HYSTERECTOMY    . NASAL SEPTUM SURGERY    . ROTATOR CUFF REPAIR Right   . TOTAL SHOULDER ARTHROPLASTY     Social History   Social History Narrative  . Not on file   Immunization History  Administered Date(s) Administered  . PFIZER(Purple Top)SARS-COV-2 Vaccination 07/06/2019, 07/27/2019, 03/10/2020     Objective: Vital Signs: There were no vitals taken for this visit.   Physical Exam   Musculoskeletal Exam: ***  CDAI Exam: CDAI Score: -- Patient Global: --; Provider Global: -- Swollen: --; Tender: -- Joint Exam 08/14/2023   No joint exam has been documented for this visit   There is currently no information documented on the homunculus. Go to the Rheumatology activity and complete the homunculus joint exam.  Investigation: No additional findings.  Imaging: No results found.  Recent Labs: Lab Results  Component Value Date   WBC 5.4 07/27/2023   HGB 11.3 (L) 07/27/2023   PLT 202 07/27/2023   NA 135 07/27/2023   K 4.1 07/27/2023   CL 102 07/27/2023   CO2 25 07/27/2023   GLUCOSE 112 (H) 07/27/2023  BUN 17 07/27/2023   CREATININE 0.65 07/27/2023   BILITOT 0.4 07/27/2023   ALKPHOS 50 12/20/2020   AST 19 07/27/2023   ALT 13 07/27/2023   PROT 6.0 (L) 07/27/2023   ALBUMIN 3.6 12/20/2020   CALCIUM 8.9 07/27/2023   GFRAA 81 06/14/2020    Speciality Comments: Prolia:  06/27/20, 01/16/21,07/18/2021,01/16/22  Labs in [employer] tab  Procedures:  No procedures performed Allergies: Actonel [risedronate], Fosamax [alendronate], and Lisinopril   Assessment / Plan:     Visit Diagnoses: No diagnosis found.  Orders: No orders of the defined types were placed in this encounter.  No orders of the defined types were placed in this encounter.   Face-to-face time spent with patient was *** minutes. Greater than 50% of time was spent in counseling and coordination of care.  Follow-Up Instructions: No follow-ups on file.   Ellen Henri, CMA  Note - This record has been created using Animal nutritionist.  Chart creation errors have been sought, but may not always  have been located. Such creation errors do not reflect on  the standard of medical care.

## 2023-08-10 ENCOUNTER — Other Ambulatory Visit: Payer: Self-pay

## 2023-08-11 NOTE — Progress Notes (Unsigned)
 Office Visit Note  Patient: Danielle Dean             Date of Birth: [birth year]           MRN: [id number]             PCP: Georgann Housekeeper, MD Referring: Georgann Housekeeper, MD Visit Date: 08/12/2023 Occupation: @GUAROCC @  Subjective:  Pain in multiple joints  History of Present Illness: ERON GOBLE is a 89 y.o. female with osteoarthritis and osteoporosis.  She received Prolia injection today without any difficulty.  She has been taking calcium and vitamin D.  She continues to have discomfort in her shoulders, both hands and her knee joints.  She states she is also noticing some triggering of her left middle finger intermittently.  She notices some numbness in the tips of her fingers.  She denies any joint swelling.    Activities of Daily Living:  Patient reports morning stiffness for unsure how many minutes.   Patient Reports nocturnal pain.  Difficulty dressing/grooming: Reports Difficulty climbing stairs: Reports Difficulty getting out of chair: Reports Difficulty using hands for taps, buttons, cutlery, and/or writing: Reports  Review of Systems  Constitutional:  Negative for fatigue.  HENT:  Positive for mouth sores. Negative for mouth dryness.   Eyes:  Positive for dryness.  Respiratory:  Negative for shortness of breath.   Cardiovascular:  Negative for chest pain and palpitations.  Gastrointestinal:  Negative for blood in stool, constipation and diarrhea.  Endocrine: Negative for increased urination.  Genitourinary:  Negative for involuntary urination.  Musculoskeletal:  Positive for joint pain, gait problem, joint pain, joint swelling, myalgias, morning stiffness, muscle tenderness and myalgias. Negative for muscle weakness.  Skin:  Positive for sensitivity to sunlight. Negative for color change, rash and hair loss.  Allergic/Immunologic: Positive for susceptible to infections.  Neurological:  Positive for dizziness and headaches.  Hematological:  Negative for swollen  glands.  Psychiatric/Behavioral:  Negative for depressed mood and sleep disturbance. The patient is nervous/anxious.     PMFS History:  Patient Active Problem List   Diagnosis Date Noted   Age-related physical debility 03/20/2023   Allergic rhinitis 03/20/2023   Chronic cough 03/20/2023   Chronic hyponatremia 03/20/2023   Gastro-esophageal reflux disease without esophagitis 03/20/2023   Hardening of the aorta (main artery of the heart) (HCC) 03/20/2023   Osteoarthritis of knee 03/20/2023   Itching 03/20/2023   Osteopenia 03/20/2023   Pure hypercholesterolemia 03/20/2023   Solitary pulmonary nodule 03/20/2023   Unspecified thoracic, thoracolumbar and lumbosacral intervertebral disc disorder 03/20/2023   DDD (degenerative disc disease), thoracic 11/06/2020   Other idiopathic scoliosis, thoracolumbar region 11/06/2020   DDD (degenerative disc disease), lumbar 11/06/2020   Age-related osteoporosis without current pathological fracture 11/06/2020   Porokeratosis 11/26/2018   Corns and callosities 11/26/2018   History of right shoulder replacement 10/07/2016   Chronic pain of both knees 05/20/2016   Osteoarthritis of both hands 05/20/2016   Essential hypertension 05/20/2016   Environmental allergies 05/20/2016   Primary osteoarthritis of both knees 05/19/2016   Callus of foot 11/24/2012   Pain in joint, ankle and foot 09/15/2012    Past Medical History:  Diagnosis Date   Arthritis    Callus    Cataract fragments in both eyes following surgery    Corns and callosities    Hx of hysterectomy    Hypertension    Osteoarthritis     Family History  Problem Relation Age of Onset   Arthritis Mother  Diabetes Father    Past Surgical History:  Procedure Laterality Date   ABDOMINAL HYSTERECTOMY     NASAL SEPTUM SURGERY     ROTATOR CUFF REPAIR Right    TOTAL SHOULDER ARTHROPLASTY     Social History   Social History Narrative   Not on file   Immunization History   Administered Date(s) Administered   PFIZER(Purple Top)SARS-COV-2 Vaccination 07/06/2019, 07/27/2019, 03/10/2020     Objective: Vital Signs: BP (!) 179/68 (BP Location: Right Arm, Patient Position: Sitting)   Pulse 62   Resp 14   Ht 4\' 11"  (1.499 m)   Wt 100 lb (45.4 kg)   BMI 20.20 kg/m    Physical Exam Vitals and nursing note reviewed.  Constitutional:      Appearance: She is well-developed.  HENT:     Head: Normocephalic and atraumatic.  Eyes:     Conjunctiva/sclera: Conjunctivae normal.  Cardiovascular:     Rate and Rhythm: Normal rate and regular rhythm.     Heart sounds: Normal heart sounds.  Pulmonary:     Effort: Pulmonary effort is normal.     Breath sounds: Normal breath sounds.  Abdominal:     General: Bowel sounds are normal.     Palpations: Abdomen is soft.  Musculoskeletal:     Cervical back: Normal range of motion.     Right lower leg: Edema present.     Left lower leg: Edema present.  Lymphadenopathy:     Cervical: No cervical adenopathy.  Skin:    General: Skin is warm and dry.     Capillary Refill: Capillary refill takes less than 2 seconds.  Neurological:     Mental Status: She is alert and oriented to person, place, and time.  Psychiatric:        Behavior: Behavior normal.      Musculoskeletal Exam: Cervical spine range of motion was limited.  Thoracic kyphosis was noted.  Thoracolumbar scoliosis was noted.  She had no tenderness over thoracic or lumbar spine.  Shoulder joint abduction was limited to about 110 degrees bilaterally with limited internal rotation.  Elbow joints and wrist joints in good range of motion.  She had bilateral CMC PIP and DIP thickening with subluxation of several of the PIP and DIP joints.  Left middle finger flexor tendon thickening was noted.  Hip joints were in good range of motion.  Knee joints had limited extension without any warmth.  There was no tenderness over ankles or MTPs.  CDAI Exam: CDAI Score: -- Patient  Global: --; Provider Global: -- Swollen: --; Tender: -- Joint Exam 08/12/2023   No joint exam has been documented for this visit   There is currently no information documented on the homunculus. Go to the Rheumatology activity and complete the homunculus joint exam.  Investigation: No additional findings.  Imaging: No results found.  Recent Labs: Lab Results  Component Value Date   WBC 5.4 07/27/2023   HGB 11.3 (L) 07/27/2023   PLT 202 07/27/2023   NA 135 07/27/2023   K 4.1 07/27/2023   CL 102 07/27/2023   CO2 25 07/27/2023   GLUCOSE 112 (H) 07/27/2023   BUN 17 07/27/2023   CREATININE 0.65 07/27/2023   BILITOT 0.4 07/27/2023   ALKPHOS 50 12/20/2020   AST 19 07/27/2023   ALT 13 07/27/2023   PROT 6.0 (L) 07/27/2023   ALBUMIN 3.6 12/20/2020   CALCIUM 8.9 07/27/2023   GFRAA 81 06/14/2020    Speciality Comments: Prolia: 06/27/20, 01/16/21,07/18/2021,01/16/22  Labs  in [employer] [employer] tab  Procedures:  No procedures performed Allergies: Actonel [risedronate], Fosamax [alendronate], and Lisinopril   Assessment / Plan:     Visit Diagnoses: Primary osteoarthritis of both knees -she has severe osteoarthritis and 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  S/p orthovisc bilateral knees 09/2022.  She had no response to viscosupplement injections.  She responds well to cortisone injection which lasted for few months.  Patient wanted cortisone injection to her knee joints.  Unfortunately because her blood pressure was elevated I could not inject her knee joints with cortisone.  Patient was disappointed.  Advised her to monitor blood pressure at home.  If her blood pressure comes down then she can come in for cortisone injection.  History of right shoulder replacement-she has limited abduction and internal rotation bilaterally.  She continues to have chronic discomfort in her shoulders.  Rupture of left biceps tendon, sequela  Primary osteoarthritis of both hands-she has  severe osteoarthritis in her hands with CMC, PIP and DIP subluxation.  She has been experiencing some numbness on the fingertips most likely due to severe osteoarthritis.  DDD (degenerative disc disease), thoracic-chronic discomfort.  Other idiopathic scoliosis, thoracolumbar region-she continues to have chronic lower back pain.  Degeneration of intervertebral disc of lumbar region without discogenic back pain or lower extremity pain-chronic pain.  Age-related osteoporosis without current pathological fracture - DEXA 10/09/22: T-score: -4.7, BMD: 0.413 left radius. She has been on Prolia since January 2022.  Last Prolia was on 02/04/2023 - Plan: denosumab (PROLIA) injection 60 mg, patient received her Prolia injection today.  July 27, 2023 CBC was normal and CMP was normal with calcium 8.9.  She was advised to take calcium and vitamin D on a regular basis.  Increased risk of hypocalcemia after Prolia injection was advised.  Pedal edema-she is to begin pedal edema.  She is on Lasix and also take salt tablets.  Which could be contributing to elevated blood pressure.  History of hypertension-blood pressure was elevated at 179/68.  Repeat blood pressure was 170/68.  Patient was advised to monitor blood pressure at home and take the chart to her PCP.  Orders: No orders of the defined types were placed in this encounter.  Meds ordered this encounter  Medications   DISCONTD: denosumab (PROLIA) injection 60 mg    Patient is enrolled in [facility] program for this medication and I have provided a copy of the Prolia Medication Guide and Patient Brochure.:   Yes    I have reviewed with the patient the information in the Prolia Medication Guide and Patient Counseling Chart including the serious risks of Prolia and symptoms of each risk.:   Yes    I have advised the patient to seek medical attention if they have signs or symptoms of any of the serious risks.:   Yes   denosumab (PROLIA) injection 60 mg     Patient is enrolled in [facility] program for this medication and I have provided a copy of the Prolia Medication Guide and Patient Brochure.:   Yes    I have reviewed with the patient the information in the Prolia Medication Guide and Patient Counseling Chart including the serious risks of Prolia and symptoms of each risk.:   Yes    I have advised the patient to seek medical attention if they have signs or symptoms of any of the serious risks.:   Yes     Follow-Up Instructions: Return in about 6 months (around 02/09/2024) for Osteoarthritis, Osteoporosis.  Pollyann Savoy, MD  Note - This record has been created using Animal nutritionist.  Chart creation errors have been sought, but may not always  have been located. Such creation errors do not reflect on  the standard of medical care.

## 2023-08-12 ENCOUNTER — Ambulatory Visit: Payer: Medicare Other | Attending: Rheumatology | Admitting: Rheumatology

## 2023-08-12 ENCOUNTER — Encounter: Payer: Self-pay | Admitting: Rheumatology

## 2023-08-12 VITALS — BP 170/68 | HR 61 | Resp 14 | Ht 59.0 in | Wt 100.0 lb

## 2023-08-12 DIAGNOSIS — M19041 Primary osteoarthritis, right hand: Secondary | ICD-10-CM

## 2023-08-12 DIAGNOSIS — M810 Age-related osteoporosis without current pathological fracture: Secondary | ICD-10-CM | POA: Diagnosis present

## 2023-08-12 DIAGNOSIS — Z96611 Presence of right artificial shoulder joint: Secondary | ICD-10-CM | POA: Diagnosis present

## 2023-08-12 DIAGNOSIS — M4125 Other idiopathic scoliosis, thoracolumbar region: Secondary | ICD-10-CM

## 2023-08-12 DIAGNOSIS — M19042 Primary osteoarthritis, left hand: Secondary | ICD-10-CM | POA: Diagnosis present

## 2023-08-12 DIAGNOSIS — M5134 Other intervertebral disc degeneration, thoracic region: Secondary | ICD-10-CM

## 2023-08-12 DIAGNOSIS — M51369 Other intervertebral disc degeneration, lumbar region without mention of lumbar back pain or lower extremity pain: Secondary | ICD-10-CM

## 2023-08-12 DIAGNOSIS — Z8679 Personal history of other diseases of the circulatory system: Secondary | ICD-10-CM

## 2023-08-12 DIAGNOSIS — R600 Localized edema: Secondary | ICD-10-CM | POA: Diagnosis present

## 2023-08-12 DIAGNOSIS — M170 Bilateral primary osteoarthritis of knee: Secondary | ICD-10-CM | POA: Diagnosis not present

## 2023-08-12 DIAGNOSIS — S46212S Strain of muscle, fascia and tendon of other parts of biceps, left arm, sequela: Secondary | ICD-10-CM

## 2023-08-12 DIAGNOSIS — M25571 Pain in right ankle and joints of right foot: Secondary | ICD-10-CM

## 2023-08-12 MED ORDER — DENOSUMAB 60 MG/ML ~~LOC~~ SOSY: 60.0000 mg | PREFILLED_SYRINGE | SUBCUTANEOUS | Status: DC

## 2023-08-12 MED ORDER — DENOSUMAB 60 MG/ML ~~LOC~~ SOSY
60.0000 mg | PREFILLED_SYRINGE | Freq: Once | SUBCUTANEOUS | Status: AC
  Administered 2023-08-12: 60 mg via SUBCUTANEOUS

## 2023-08-12 NOTE — Progress Notes (Signed)
 Subjective:   Patient presents to clinic today to receive bi-annual dose of Prolia.  Patient running a fever or have signs/symptoms of infection? No  Patient currently on antibiotics for the treatment of infection? No  Patient had fall in the last 6 months?  No  If yes, did it require medical attention? No   Patient taking calcium 1200 mg daily through diet or supplement and at least 800 units vitamin D? Yes  Objective: CMP     Component Value Date/Time   NA 135 07/27/2023 0912   K 4.1 07/27/2023 0912   CL 102 07/27/2023 0912   CO2 25 07/27/2023 0912   GLUCOSE 112 (H) 07/27/2023 0912   BUN 17 07/27/2023 0912   CREATININE 0.65 07/27/2023 0912   CALCIUM 8.9 07/27/2023 0912   PROT 6.0 (L) 07/27/2023 0912   ALBUMIN 3.6 12/20/2020 1422   AST 19 07/27/2023 0912   ALT 13 07/27/2023 0912   ALKPHOS 50 12/20/2020 1422   BILITOT 0.4 07/27/2023 0912   GFRNONAA >60 12/20/2020 1422   GFRNONAA 70 06/14/2020 0000   GFRAA 81 06/14/2020 0000    CBC    Component Value Date/Time   WBC 5.4 07/27/2023 0912   RBC 3.74 (L) 07/27/2023 0912   HGB 11.3 (L) 07/27/2023 0912   HCT 33.6 (L) 07/27/2023 0912   PLT 202 07/27/2023 0912   MCV 89.8 07/27/2023 0912   MCH 30.2 07/27/2023 0912   MCHC 33.6 07/27/2023 0912   RDW 11.7 07/27/2023 0912   LYMPHSABS 1,778 01/07/2022 1109   MONOABS 0.8 12/20/2020 1422   EOSABS 130 07/27/2023 0912   BASOSABS 32 07/27/2023 0912    Lab Results  Component Value Date   VD25OH 56 07/27/2023    T-score: -4.7   Assessment/Plan:   Administrations This Visit     denosumab (PROLIA) injection 60 mg     Admin Date 08/12/2023 Action Given Dose 60 mg Route Subcutaneous Documented By Henriette Combs, LPN             Patient tolerated injection well.    All questions encouraged and answered.  Instructed patient to call with any further questions or concerns.

## 2023-08-13 MED ORDER — DENOSUMAB 60 MG/ML ~~LOC~~ SOSY
60.0000 mg | PREFILLED_SYRINGE | SUBCUTANEOUS | Status: AC
  Administered 2024-02-16: 60 mg via SUBCUTANEOUS

## 2023-08-13 NOTE — Addendum Note (Signed)
 Addended by: Murrell Redden on: 08/13/2023 11:00 AM   Modules accepted: Orders

## 2023-08-14 ENCOUNTER — Ambulatory Visit: Payer: Medicare Other | Admitting: Rheumatology

## 2023-08-14 DIAGNOSIS — M19041 Primary osteoarthritis, right hand: Secondary | ICD-10-CM

## 2023-08-14 DIAGNOSIS — Z96611 Presence of right artificial shoulder joint: Secondary | ICD-10-CM

## 2023-08-14 DIAGNOSIS — M5134 Other intervertebral disc degeneration, thoracic region: Secondary | ICD-10-CM

## 2023-08-14 DIAGNOSIS — M51369 Other intervertebral disc degeneration, lumbar region without mention of lumbar back pain or lower extremity pain: Secondary | ICD-10-CM

## 2023-08-14 DIAGNOSIS — Z8679 Personal history of other diseases of the circulatory system: Secondary | ICD-10-CM

## 2023-08-14 DIAGNOSIS — E871 Hypo-osmolality and hyponatremia: Secondary | ICD-10-CM

## 2023-08-14 DIAGNOSIS — M170 Bilateral primary osteoarthritis of knee: Secondary | ICD-10-CM

## 2023-08-14 DIAGNOSIS — M810 Age-related osteoporosis without current pathological fracture: Secondary | ICD-10-CM

## 2023-08-14 DIAGNOSIS — R2689 Other abnormalities of gait and mobility: Secondary | ICD-10-CM

## 2023-08-14 DIAGNOSIS — G8929 Other chronic pain: Secondary | ICD-10-CM

## 2023-08-14 DIAGNOSIS — M4125 Other idiopathic scoliosis, thoracolumbar region: Secondary | ICD-10-CM

## 2023-09-21 ENCOUNTER — Other Ambulatory Visit (HOSPITAL_COMMUNITY): Payer: Self-pay

## 2023-10-21 ENCOUNTER — Ambulatory Visit (INDEPENDENT_AMBULATORY_CARE_PROVIDER_SITE_OTHER): Admitting: Podiatry

## 2023-10-21 DIAGNOSIS — Q828 Other specified congenital malformations of skin: Secondary | ICD-10-CM

## 2023-10-21 NOTE — Progress Notes (Signed)
 Subjective:  Patient ID: Danielle Dean, female    DOB: 1929-04-23,  MRN: [id number]  Chief Complaint  Patient presents with   Callouses    89 y.o. female presents with the above complaint.  Patient presents for follow-up of bilateral submetatarsal 5 porokeratotic lesion.  She states this started coming back again.  No surgical plans at this time she wants to debride down   Review of Systems: Negative except as noted in the HPI. Denies N/V/F/Ch.  Past Medical History:  Diagnosis Date   Arthritis    Callus    Cataract fragments in both eyes following surgery    Corns and callosities    Hx of hysterectomy    Hypertension    Osteoarthritis     Current Outpatient Medications:    Acetaminophen  (TYLENOL  ARTHRITIS PAIN PO), Take 2 tablets by mouth 2 (two) times daily as needed., Disp: , Rfl:    amLODipine (NORVASC) 2.5 MG tablet, Take 2.5 mg by mouth daily. , Disp: , Rfl:    atenolol (TENORMIN) 50 MG tablet, Take 50 mg by mouth daily., Disp: , Rfl:    b complex vitamins tablet, Take 1 tablet by mouth every other day. (Patient not taking: Reported on 08/12/2023), Disp: , Rfl:    calcium carbonate (OS-CAL) 600 MG TABS, Take 600 mg by mouth daily., Disp: , Rfl:    cholecalciferol (VITAMIN D ) 1000 UNITS tablet, Take 1,000 Units by mouth daily., Disp: , Rfl:    Cyanocobalamin (VITAMIN B-12 PO), Take by mouth every other day., Disp: , Rfl:    denosumab  (PROLIA ) 60 MG/ML SOSY injection, Inject 60 mg into the skin every 6 (six) months. Courier to rheum: [address], [address], [name] [address]. Appt on 02/04/23, Disp: 1 mL, Rfl: 0   furosemide (LASIX) 20 MG tablet, 20mg  alternating with 10mg  every other day., Disp: , Rfl:    lansoprazole (PREVACID) 30 MG capsule, Take 30 mg by mouth daily., Disp: , Rfl:    losartan (COZAAR) 100 MG tablet, Take 100 mg by mouth daily. , Disp: , Rfl:    sodium chloride  1 g tablet, Take 1 g by mouth 2 (two) times daily with a meal., Disp: , Rfl:    traMADol  (ULTRAM) 50 MG tablet, Take 25 mg by mouth daily., Disp: , Rfl:   Current Facility-Administered Medications:    [START ON 02/09/2024] denosumab  (PROLIA ) injection 60 mg, 60 mg, Subcutaneous, Q6 months,   Social History   Tobacco Use  Smoking Status Never   Passive exposure: Never  Smokeless Tobacco Never    Allergies  Allergen Reactions   Actonel [Risedronate]    Fosamax [Alendronate]    Lisinopril Other (See Comments)    Cough    Objective:  There were no vitals filed for this visit. There is no height or weight on file to calculate BMI. Constitutional Well developed. Well nourished.  Vascular Dorsalis pedis pulses palpable bilaterally. Posterior tibial pulses palpable bilaterally. Capillary refill normal to all digits.  No cyanosis or clubbing noted. Pedal hair growth normal.  Neurologic Normal speech. Oriented to person, place, and time. Epicritic sensation to light touch grossly present bilaterally.  Dermatologic Bilateral submetatarsal 5 porokeratotic lesion with central nucleated core noted pain on palpation to the lesion.  No pain pinpoint bleeding noted upon debridement  Orthopedic: Normal joint ROM without pain or crepitus bilaterally. No visible deformities. No bony tenderness.   Radiographs: None Assessment:   No diagnosis found.  Plan:  Patient was evaluated and  treated and all questions answered.  Bilateral submetatarsal 5 porokeratosis -All questions and concerns were discussed with the patient extensive detail given the amount of pain that she is experiencing she will benefit from treatment of the lesion using chisel and a negative disease was debrided down to healthy striated tissue.  No complication noted no pinpoint bleeding noted.  No follow-ups on file.

## 2024-01-26 ENCOUNTER — Other Ambulatory Visit: Payer: Self-pay

## 2024-01-27 NOTE — Progress Notes (Signed)
 Office Visit Note  Patient: Danielle Dean             Date of Birth: 08/03/1928           MRN: [id number]             PCP: Ransom Other, MD Referring: Ransom Other, MD Visit Date: 02/10/2024 Occupation: @GUAROCC @  Subjective:  Pain in multiple joints  History of Present Illness: LAURRIE Dean is a 89 y.o. female with osteoarthritis and osteoporosis.  She returns today after her last visit in February 2025.  She states she continues to have pain and discomfort in her left shoulder, both hands and her both knees.  She wants to have bilateral knee joint cortisone injections.  She was accompanied by her daughter today.  She also wants to get her next Prolia  injection after getting labs today.  She continues to have discomfort in the lower back.    Activities of Daily Living:  Patient reports morning stiffness for 15-30 minutes.   Patient Reports nocturnal pain.  Difficulty dressing/grooming: Denies Difficulty climbing stairs: Reports Difficulty getting out of chair: Denies Difficulty using hands for taps, buttons, cutlery, and/or writing: Reports  Review of Systems  Constitutional:  Negative for fatigue.  HENT:  Positive for mouth sores. Negative for mouth dryness.   Eyes:  Positive for dryness.  Respiratory:  Negative for shortness of breath.   Cardiovascular:  Negative for chest pain and palpitations.  Gastrointestinal:  Positive for constipation. Negative for blood in stool and diarrhea.  Endocrine: Positive for increased urination.  Genitourinary:  Negative for involuntary urination.  Musculoskeletal:  Positive for joint pain, gait problem, joint pain, joint swelling, myalgias, morning stiffness, muscle tenderness and myalgias. Negative for muscle weakness.  Skin:  Positive for sensitivity to sunlight. Negative for color change, rash and hair loss.  Allergic/Immunologic: Positive for susceptible to infections.  Neurological:  Positive for headaches. Negative for  dizziness.  Hematological:  Negative for swollen glands.  Psychiatric/Behavioral:  Positive for sleep disturbance. Negative for depressed mood. The patient is nervous/anxious.     PMFS History:  Patient Active Problem List   Diagnosis Date Noted   Age-related physical debility 03/20/2023   Allergic rhinitis 03/20/2023   Chronic cough 03/20/2023   Chronic hyponatremia 03/20/2023   Gastro-esophageal reflux disease without esophagitis 03/20/2023   Hardening of the aorta (main artery of the heart) (HCC) 03/20/2023   Osteoarthritis of knee 03/20/2023   Itching 03/20/2023   Osteopenia 03/20/2023   Pure hypercholesterolemia 03/20/2023   Solitary pulmonary nodule 03/20/2023   Unspecified thoracic, thoracolumbar and lumbosacral intervertebral disc disorder 03/20/2023   DDD (degenerative disc disease), thoracic 11/06/2020   Other idiopathic scoliosis, thoracolumbar region 11/06/2020   DDD (degenerative disc disease), lumbar 11/06/2020   Age-related osteoporosis without current pathological fracture 11/06/2020   Porokeratosis 11/26/2018   Corns and callosities 11/26/2018   History of right shoulder replacement 10/07/2016   Chronic pain of both knees 05/20/2016   Osteoarthritis of both hands 05/20/2016   Essential hypertension 05/20/2016   Environmental allergies 05/20/2016   Primary osteoarthritis of both knees 05/19/2016   Callus of foot 11/24/2012   Pain in joint, ankle and foot 09/15/2012    Past Medical History:  Diagnosis Date   Arthritis    Callus    Cataract fragments in both eyes following surgery    Corns and callosities    Hx of hysterectomy    Hypertension    Osteoarthritis     Family History  Problem Relation Age of Onset   Arthritis Mother    Diabetes Father    Past Surgical History:  Procedure Laterality Date   ABDOMINAL HYSTERECTOMY     NASAL SEPTUM SURGERY     ROTATOR CUFF REPAIR Right    TOTAL SHOULDER ARTHROPLASTY     Social History   Social History  Narrative   Not on file   Immunization History  Administered Date(s) Administered   PFIZER(Purple Top)SARS-COV-2 Vaccination 07/06/2019, 07/27/2019, 03/10/2020     Objective: Vital Signs: BP (!) 136/55 (BP Location: Left Arm, Patient Position: Sitting, Cuff Size: Normal)   Pulse (!) 57   Resp 16   Ht 4' 11 (1.499 m)   Wt 97 lb (44 kg)   BMI 19.59 kg/m    Physical Exam Vitals and nursing note reviewed.  Constitutional:      Appearance: She is well-developed.  HENT:     Head: Normocephalic and atraumatic.  Eyes:     Conjunctiva/sclera: Conjunctivae normal.  Cardiovascular:     Rate and Rhythm: Normal rate and regular rhythm.     Heart sounds: Normal heart sounds.  Pulmonary:     Effort: Pulmonary effort is normal.     Breath sounds: Normal breath sounds.  Abdominal:     General: Bowel sounds are normal.     Palpations: Abdomen is soft.  Musculoskeletal:     Cervical back: Normal range of motion.  Lymphadenopathy:     Cervical: No cervical adenopathy.  Skin:    General: Skin is warm and dry.     Capillary Refill: Capillary refill takes less than 2 seconds.  Neurological:     Mental Status: She is alert and oriented to person, place, and time.  Psychiatric:        Behavior: Behavior normal.      Musculoskeletal Exam: She had limited range of motion of the cervical spine.  Thoracic kyphosis was noted.  Thoracolumbar scoliosis was noted.  She had discomfort range of motion of the lumbar spine.  She had limited abduction of her shoulders.  Limited internal rotation of her left shoulder.  Elbows and wrist joints in good range of motion.  Bilateral CMC PIP and DIP thickening was noted.  She has some inflammatory changes in her PIP joints.  Hip joints and knee joints were in good range of motion.  She had mild effusion in her right knee joint.  There was no tenderness over her ankles or MTPs.  CDAI Exam: CDAI Score: -- Patient Global: --; Provider Global: -- Swollen: --;  Tender: -- Joint Exam 02/10/2024   No joint exam has been documented for this visit   There is currently no information documented on the homunculus. Go to the Rheumatology activity and complete the homunculus joint exam.  Investigation: No additional findings.  Imaging: No results found.  Recent Labs: Lab Results  Component Value Date   WBC 5.4 07/27/2023   HGB 11.3 (L) 07/27/2023   PLT 202 07/27/2023   NA 135 07/27/2023   K 4.1 07/27/2023   CL 102 07/27/2023   CO2 25 07/27/2023   GLUCOSE 112 (H) 07/27/2023   BUN 17 07/27/2023   CREATININE 0.65 07/27/2023   BILITOT 0.4 07/27/2023   ALKPHOS 50 12/20/2020   AST 19 07/27/2023   ALT 13 07/27/2023   PROT 6.0 (L) 07/27/2023   ALBUMIN 3.6 12/20/2020   CALCIUM 8.9 07/27/2023   GFRAA 81 06/14/2020    Speciality Comments: Prolia : 06/27/20, 01/16/21,07/18/2021,01/16/22  Labs in [facility] tab  Procedures:  Large Joint Inj: bilateral knee on 02/10/2024 11:50 AM Indications: pain Details: 27 G 1.5 in needle, medial approach  Arthrogram: No  Medications (Right): 1.5 mL lidocaine  1 %; 40 mg triamcinolone  acetonide 40 MG/ML Aspirate (Right): 0 mL Medications (Left): 1.5 mL lidocaine  1 %; 40 mg triamcinolone  acetonide 40 MG/ML Aspirate (Left): 0 mL Outcome: tolerated well, no immediate complications  Risk of infection, tendon injury, nerve injury, dermal atrophy and hypopigmentation were discussed. Procedure, treatment alternatives, risks and benefits explained, specific risks discussed. Consent was given by the patient. Immediately prior to procedure a time out was called to verify the correct patient, procedure, equipment, support staff and site/side marked as required. Patient was prepped and draped in the usual sterile fashion.     Allergies: Actonel [risedronate], Fosamax [alendronate], and Lisinopril   Assessment / Plan:     Visit Diagnoses: Primary osteoarthritis of both knees -she continues to have pain and discomfort  in the bilateral knee joints.  She requested repeat cortisone injections to both knees..  Patient had an adequate response to Visco.  Last cortisone injection to bilateral knee joints April 2024.  After informed consent was obtained bilateral knee joints were injected with lidocaine  and Kenalog  as described above.  She tolerated the procedure well.  Postprocedure instructions were given.  History of right shoulder replacement - Limited range of motion.  Rupture of left biceps tendon, sequela-she continues to have discomfort with range of motion of her left shoulder joint.  She had limited range of motion.  Primary osteoarthritis of both hands-severe osteoarthritis involving bilateral hands with DIP and PIP thickening.  Inflammatory changes were noted in the right 4th and 5th PIP joints.  Joint protection muscle strengthening was discussed.  DDD (degenerative disc disease), thoracic-chronic discomfort.  Other idiopathic scoliosis, thoracolumbar region-she continues to have lower back pain.  Degeneration of intervertebral disc of lumbar region without discogenic back pain or lower extremity pain-I offered physical therapy but patient declined.  Age-related osteoporosis without current pathological fracture - DEXA 10/09/22: T-score: -4.7, BMD: 0.413 left radius. She has been on Prolia  since January 2022.  Last Prolia  was on August 12, 2023- Plan: VITAMIN D  25 Hydroxy (Vit-D Deficiency, Fractures).  Patient will return for next Prolia  injection after the labs.  Vitamin D  deficiency -vitamin D  was 52 on July 27, 2023.  Plan: VITAMIN D  25 Hydroxy (Vit-D Deficiency, Fractures)  Medication monitoring encounter - Plan: Comprehensive metabolic panel with GFR, CBC with Differential/Platelet  Pedal edema-improved.  History of hypertension  Poor balance-patient declined physical therapy.  Orders: Orders Placed This Encounter  Procedures   Large Joint Inj   Comprehensive metabolic panel with GFR    CBC with Differential/Platelet   VITAMIN D  25 Hydroxy (Vit-D Deficiency, Fractures)   No orders of the defined types were placed in this encounter.    Follow-Up Instructions: Return in about 6 months (around 08/12/2024) for Osteoarthritis, Osteoporosis.   Maya Nash, MD  Note - This record has been created using Animal nutritionist.  Chart creation errors have been sought, but may not always  have been located. Such creation errors do not reflect on  the standard of medical care.

## 2024-01-28 ENCOUNTER — Other Ambulatory Visit: Payer: Self-pay

## 2024-02-05 ENCOUNTER — Other Ambulatory Visit: Payer: Self-pay

## 2024-02-05 ENCOUNTER — Telehealth: Payer: Self-pay | Admitting: Pharmacist

## 2024-02-05 ENCOUNTER — Other Ambulatory Visit (HOSPITAL_COMMUNITY): Payer: Self-pay

## 2024-02-05 DIAGNOSIS — M810 Age-related osteoporosis without current pathological fracture: Secondary | ICD-10-CM

## 2024-02-05 DIAGNOSIS — Z5181 Encounter for therapeutic drug level monitoring: Secondary | ICD-10-CM

## 2024-02-05 MED ORDER — PROLIA 60 MG/ML ~~LOC~~ SOSY
60.0000 mg | PREFILLED_SYRINGE | SUBCUTANEOUS | 0 refills | Status: AC
  Filled 2024-02-05: qty 1, 180d supply, fill #0

## 2024-02-05 NOTE — Progress Notes (Signed)
 Specialty Pharmacy Refill Coordination Note  Danielle Dean is a 89 y.o. female assessed today regarding refills of clinic administered specialty medication(s) Denosumab  (PROLIA )   Clinic requested Courier to Provider Office   Delivery date: 02/08/24   Verified address: [facility] [address], [place] [address]   Medication will be filled on 02/05/24.    Appointment 8/27.

## 2024-02-05 NOTE — Telephone Encounter (Signed)
 Patient due for Prolia  on 02/08/2024.   She has an appt on 02/10/2024. Can plan to administer Prolia  at this appt if patient has labs updated prior to appt. Will need CMP updated. Left VM for patient with walk-in lab hours  Rx sent to [facility] to be couriered to clinic before appt  Sherry Pennant, PharmD, MPH, BCPS, CPP Clinical Pharmacist (Rheumatology and Pulmonology)

## 2024-02-09 NOTE — Telephone Encounter (Signed)
 ATC patient regarding labs needed for Prolia . Unable to reach. Left VM  Sherry Pennant, PharmD, MPH, BCPS, CPP Clinical Pharmacist (Rheumatology and Pulmonology)

## 2024-02-10 ENCOUNTER — Encounter: Payer: Self-pay | Admitting: Rheumatology

## 2024-02-10 ENCOUNTER — Ambulatory Visit: Payer: Medicare Other | Attending: Rheumatology | Admitting: Rheumatology

## 2024-02-10 VITALS — BP 136/55 | HR 57 | Resp 16 | Ht 59.0 in | Wt 97.0 lb

## 2024-02-10 DIAGNOSIS — S46212S Strain of muscle, fascia and tendon of other parts of biceps, left arm, sequela: Secondary | ICD-10-CM | POA: Diagnosis not present

## 2024-02-10 DIAGNOSIS — M5134 Other intervertebral disc degeneration, thoracic region: Secondary | ICD-10-CM | POA: Diagnosis present

## 2024-02-10 DIAGNOSIS — M19042 Primary osteoarthritis, left hand: Secondary | ICD-10-CM | POA: Diagnosis present

## 2024-02-10 DIAGNOSIS — M810 Age-related osteoporosis without current pathological fracture: Secondary | ICD-10-CM | POA: Diagnosis present

## 2024-02-10 DIAGNOSIS — Z8679 Personal history of other diseases of the circulatory system: Secondary | ICD-10-CM | POA: Diagnosis present

## 2024-02-10 DIAGNOSIS — R2689 Other abnormalities of gait and mobility: Secondary | ICD-10-CM | POA: Diagnosis present

## 2024-02-10 DIAGNOSIS — M51369 Other intervertebral disc degeneration, lumbar region without mention of lumbar back pain or lower extremity pain: Secondary | ICD-10-CM

## 2024-02-10 DIAGNOSIS — Z5181 Encounter for therapeutic drug level monitoring: Secondary | ICD-10-CM

## 2024-02-10 DIAGNOSIS — M170 Bilateral primary osteoarthritis of knee: Secondary | ICD-10-CM | POA: Insufficient documentation

## 2024-02-10 DIAGNOSIS — M19041 Primary osteoarthritis, right hand: Secondary | ICD-10-CM

## 2024-02-10 DIAGNOSIS — R600 Localized edema: Secondary | ICD-10-CM | POA: Diagnosis present

## 2024-02-10 DIAGNOSIS — M4125 Other idiopathic scoliosis, thoracolumbar region: Secondary | ICD-10-CM

## 2024-02-10 DIAGNOSIS — Z96611 Presence of right artificial shoulder joint: Secondary | ICD-10-CM | POA: Diagnosis present

## 2024-02-10 DIAGNOSIS — E559 Vitamin D deficiency, unspecified: Secondary | ICD-10-CM

## 2024-02-10 MED ORDER — TRIAMCINOLONE ACETONIDE 40 MG/ML IJ SUSP
40.0000 mg | INTRAMUSCULAR | Status: AC | PRN
  Administered 2024-02-10: 40 mg via INTRA_ARTICULAR

## 2024-02-10 MED ORDER — LIDOCAINE HCL 1 % IJ SOLN
1.5000 mL | INTRAMUSCULAR | Status: AC | PRN
  Administered 2024-02-10: 1.5 mL

## 2024-02-11 ENCOUNTER — Ambulatory Visit: Payer: Self-pay | Admitting: Rheumatology

## 2024-02-11 LAB — COMPREHENSIVE METABOLIC PANEL WITH GFR
AG Ratio: 1.9 (calc) (ref 1.0–2.5)
ALT: 17 U/L (ref 6–29)
AST: 25 U/L (ref 10–35)
Albumin: 4.1 g/dL (ref 3.6–5.1)
Alkaline phosphatase (APISO): 63 U/L (ref 37–153)
BUN: 20 mg/dL (ref 7–25)
CO2: 27 mmol/L (ref 20–32)
Calcium: 9.2 mg/dL (ref 8.6–10.4)
Chloride: 99 mmol/L (ref 98–110)
Creat: 0.7 mg/dL (ref 0.60–0.95)
Globulin: 2.2 g/dL (ref 1.9–3.7)
Glucose, Bld: 89 mg/dL (ref 65–99)
Potassium: 4.9 mmol/L (ref 3.5–5.3)
Sodium: 133 mmol/L — ABNORMAL LOW (ref 135–146)
Total Bilirubin: 0.5 mg/dL (ref 0.2–1.2)
Total Protein: 6.3 g/dL (ref 6.1–8.1)
eGFR: 81 mL/min/1.73m2 (ref >=60)

## 2024-02-11 LAB — CBC WITH DIFFERENTIAL/PLATELET
Absolute Lymphocytes: 1973 {cells}/uL (ref 850–3900)
Absolute Monocytes: 803 {cells}/uL (ref 200–950)
Basophils Absolute: 47 {cells}/uL (ref 0–200)
Basophils Relative: 0.6 %
Eosinophils Absolute: 203 {cells}/uL (ref 15–500)
Eosinophils Relative: 2.6 %
HCT: 33.1 % — ABNORMAL LOW (ref 35.0–45.0)
Hemoglobin: 10.9 g/dL — ABNORMAL LOW (ref 11.7–15.5)
MCH: 31 pg (ref 27.0–33.0)
MCHC: 32.9 g/dL (ref 32.0–36.0)
MCV: 94 fL (ref 80.0–100.0)
MPV: 9.5 fL (ref 7.5–12.5)
Monocytes Relative: 10.3 %
Neutro Abs: 4774 {cells}/uL (ref 1500–7800)
Neutrophils Relative %: 61.2 %
Platelets: 212 Thousand/uL (ref 140–400)
RBC: 3.52 Million/uL — ABNORMAL LOW (ref 3.80–5.10)
RDW: 12.3 % (ref 11.0–15.0)
Total Lymphocyte: 25.3 %
WBC: 7.8 Thousand/uL (ref 3.8–10.8)

## 2024-02-11 LAB — VITAMIN D 25 HYDROXY (VIT D DEFICIENCY, FRACTURES): Vit D, 25-Hydroxy: 57 ng/mL (ref 30–100)

## 2024-02-11 NOTE — Progress Notes (Signed)
 Sodium is low and stable.  Hemoglobin is low at 10.9.  Patient should take multivitamin with iron.  Vitamin D  is normal in the desirable range.  Please forward results to her PCP.

## 2024-02-12 NOTE — Progress Notes (Signed)
 Pharmacy Note  Subjective:   Patient presents to clinic today to receive bi-annual dose of Prolia . Patient's last dose of Prolia  was on 08/12/2023  Patient running a fever or have signs/symptoms of infection? No  Patient currently on antibiotics for the treatment of infection? No  Patient had fall in the last 6 months?  No   Patient taking calcium 1200 mg daily through diet or supplement and at least 800 units vitamin D ? Yes  Objective: CMP     Component Value Date/Time   NA 133 (L) 02/10/2024 1149   K 4.9 02/10/2024 1149   CL 99 02/10/2024 1149   CO2 27 02/10/2024 1149   GLUCOSE 89 02/10/2024 1149   BUN 20 02/10/2024 1149   CREATININE 0.70 02/10/2024 1149   CALCIUM 9.2 02/10/2024 1149   PROT 6.3 02/10/2024 1149   ALBUMIN 3.6 12/20/2020 1422   AST 25 02/10/2024 1149   ALT 17 02/10/2024 1149   ALKPHOS 50 12/20/2020 1422   BILITOT 0.5 02/10/2024 1149   GFRNONAA >60 12/20/2020 1422   GFRNONAA 70 06/14/2020 0000   GFRAA 81 06/14/2020 0000    CBC    Component Value Date/Time   WBC 7.8 02/10/2024 1149   RBC 3.52 (L) 02/10/2024 1149   HGB 10.9 (L) 02/10/2024 1149   HCT 33.1 (L) 02/10/2024 1149   PLT 212 02/10/2024 1149   MCV 94.0 02/10/2024 1149   MCH 31.0 02/10/2024 1149   MCHC 32.9 02/10/2024 1149   RDW 12.3 02/10/2024 1149   LYMPHSABS 1,778 01/07/2022 1109   MONOABS 0.8 12/20/2020 1422   EOSABS 203 02/10/2024 1149   BASOSABS 47 02/10/2024 1149    Lab Results  Component Value Date   VD25OH 57 02/10/2024   DEXA 10/09/22: T-score: -4.7, BMD: 0.413 left radius   Assessment/Plan:   Reviewed importance of adequate dietary intake of calcium in addition to supplementation due to risk of hypocalcemia with Prolia .   Patient tolerated injection  well.   Administration details as below: Administrations This Visit     denosumab  (PROLIA ) injection 60 mg     Admin Date 02/16/2024 Action Given Dose 60 mg Route Subcutaneous Documented By Dayne Sherry RAMAN, RPH-CPP            Patient's next Prolia  dose is due on 08/14/2024.  Patient is due for updated DEXA in April 2026.   All questions encouraged and answered.  Instructed patient to call with any further questions or concerns.  Sherry Dayne, PharmD, MPH, BCPS, CPP Clinical Pharmacist (Rheumatology and Pulmonology)

## 2024-02-12 NOTE — Telephone Encounter (Signed)
 Patient scheduled for Prolia  on 02/16/24

## 2024-02-16 ENCOUNTER — Ambulatory Visit: Attending: Rheumatology | Admitting: Pharmacist

## 2024-02-16 DIAGNOSIS — M810 Age-related osteoporosis without current pathological fracture: Secondary | ICD-10-CM | POA: Diagnosis present

## 2024-02-16 DIAGNOSIS — Z7689 Persons encountering health services in other specified circumstances: Secondary | ICD-10-CM | POA: Diagnosis present

## 2024-02-16 MED ORDER — DENOSUMAB 60 MG/ML ~~LOC~~ SOSY: 60.0000 mg | PREFILLED_SYRINGE | SUBCUTANEOUS | Status: AC

## 2024-05-04 ENCOUNTER — Ambulatory Visit: Admitting: Rheumatology

## 2024-05-23 ENCOUNTER — Ambulatory Visit: Attending: Rheumatology | Admitting: Rheumatology

## 2024-05-23 VITALS — BP 143/68 | HR 66

## 2024-05-23 DIAGNOSIS — M170 Bilateral primary osteoarthritis of knee: Secondary | ICD-10-CM

## 2024-05-23 MED ORDER — LIDOCAINE HCL 1 % IJ SOLN
1.5000 mL | INTRAMUSCULAR | Status: AC | PRN
  Administered 2024-05-23: 1.5 mL

## 2024-05-23 MED ORDER — TRIAMCINOLONE ACETONIDE 40 MG/ML IJ SUSP
40.0000 mg | INTRAMUSCULAR | Status: AC | PRN
  Administered 2024-05-23: 40 mg via INTRA_ARTICULAR

## 2024-05-23 NOTE — Progress Notes (Signed)
   Procedure Note  Patient: Danielle Dean             Date of Birth: [birth year]           MRN: [id number]             Visit Date: 05/23/2024  Procedures: Visit Diagnoses:  1. Primary osteoarthritis of both knees     Large Joint Inj: bilateral knee on 05/23/2024 10:03 AM Indications: pain Details: 27 G 1.5 in needle, medial approach  Arthrogram: No  Medications (Right): 1.5 mL lidocaine  1 %; 40 mg triamcinolone  acetonide 40 MG/ML Medications (Left): 1.5 mL lidocaine  1 %; 40 mg triamcinolone  acetonide 40 MG/ML Outcome: tolerated well, no immediate complications  Risk of infection, tendon injury, nerve injury, hypopigmentation and dermal atrophy were discussed. Procedure, treatment alternatives, risks and benefits explained, specific risks discussed. Consent was given by the patient. Immediately prior to procedure a time out was called to verify the correct patient, procedure, equipment, support staff and site/side marked as required. Patient was prepped and draped in the usual sterile fashion.     Postprocedure instructions were given.  Maya Nash, MD

## 2024-08-18 ENCOUNTER — Ambulatory Visit: Admitting: Rheumatology
